# Patient Record
Sex: Female | Born: 1945
Health system: Southern US, Community
[De-identification: ages and names within clinical notes are randomized; demographics above are authoritative.]

## PROBLEM LIST (undated history)

## (undated) DIAGNOSIS — F329 Major depressive disorder, single episode, unspecified: Secondary | ICD-10-CM

## (undated) DIAGNOSIS — N200 Calculus of kidney: Secondary | ICD-10-CM

## (undated) DIAGNOSIS — H269 Unspecified cataract: Secondary | ICD-10-CM

## (undated) DIAGNOSIS — J45909 Unspecified asthma, uncomplicated: Secondary | ICD-10-CM

## (undated) DIAGNOSIS — I10 Essential (primary) hypertension: Secondary | ICD-10-CM

## (undated) DIAGNOSIS — R350 Frequency of micturition: Secondary | ICD-10-CM

## (undated) DIAGNOSIS — Z9989 Dependence on other enabling machines and devices: Secondary | ICD-10-CM

## (undated) DIAGNOSIS — E785 Hyperlipidemia, unspecified: Secondary | ICD-10-CM

## (undated) DIAGNOSIS — K219 Gastro-esophageal reflux disease without esophagitis: Secondary | ICD-10-CM

## (undated) DIAGNOSIS — F419 Anxiety disorder, unspecified: Secondary | ICD-10-CM

## (undated) DIAGNOSIS — F32A Depression, unspecified: Secondary | ICD-10-CM

## (undated) DIAGNOSIS — Z973 Presence of spectacles and contact lenses: Secondary | ICD-10-CM

## (undated) DIAGNOSIS — E049 Nontoxic goiter, unspecified: Secondary | ICD-10-CM

## (undated) DIAGNOSIS — G4733 Obstructive sleep apnea (adult) (pediatric): Secondary | ICD-10-CM

## (undated) DIAGNOSIS — G4730 Sleep apnea, unspecified: Secondary | ICD-10-CM

## (undated) DIAGNOSIS — T7840XA Allergy, unspecified, initial encounter: Secondary | ICD-10-CM

## (undated) DIAGNOSIS — R3915 Urgency of urination: Secondary | ICD-10-CM

## (undated) HISTORY — DX: Major depressive disorder, single episode, unspecified: F32.9

## (undated) HISTORY — PX: TUBAL LIGATION: SHX77

## (undated) HISTORY — DX: Hyperlipidemia, unspecified: E78.5

## (undated) HISTORY — DX: Nontoxic goiter, unspecified: E04.9

## (undated) HISTORY — PX: BREAST BIOPSY: SHX20

## (undated) HISTORY — DX: Unspecified cataract: H26.9

## (undated) HISTORY — DX: Depression, unspecified: F32.A

## (undated) HISTORY — DX: Gastro-esophageal reflux disease without esophagitis: K21.9

## (undated) HISTORY — DX: Anxiety disorder, unspecified: F41.9

## (undated) HISTORY — PX: EYE SURGERY: SHX253

## (undated) HISTORY — DX: Sleep apnea, unspecified: G47.30

## (undated) HISTORY — DX: Calculus of kidney: N20.0

## (undated) HISTORY — DX: Allergy, unspecified, initial encounter: T78.40XA

## (undated) HISTORY — DX: Essential (primary) hypertension: I10

## (undated) HISTORY — PX: CATARACT EXTRACTION, BILATERAL: SHX1313

---

## 1988-09-18 HISTORY — PX: DILATION AND CURETTAGE OF UTERUS: SHX78

## 1998-02-24 ENCOUNTER — Ambulatory Visit (HOSPITAL_COMMUNITY): Admission: RE | Admit: 1998-02-24 | Discharge: 1998-02-24 | Payer: Self-pay | Admitting: Obstetrics and Gynecology

## 1999-02-01 ENCOUNTER — Other Ambulatory Visit: Admission: RE | Admit: 1999-02-01 | Discharge: 1999-02-01 | Payer: Self-pay | Admitting: Obstetrics and Gynecology

## 2000-04-17 ENCOUNTER — Other Ambulatory Visit: Admission: RE | Admit: 2000-04-17 | Discharge: 2000-04-17 | Payer: Self-pay | Admitting: *Deleted

## 2000-04-17 ENCOUNTER — Encounter (INDEPENDENT_AMBULATORY_CARE_PROVIDER_SITE_OTHER): Payer: Self-pay | Admitting: Specialist

## 2001-04-15 ENCOUNTER — Encounter (INDEPENDENT_AMBULATORY_CARE_PROVIDER_SITE_OTHER): Payer: Self-pay | Admitting: *Deleted

## 2001-04-15 ENCOUNTER — Ambulatory Visit (HOSPITAL_COMMUNITY): Admission: RE | Admit: 2001-04-15 | Discharge: 2001-04-15 | Payer: Self-pay | Admitting: *Deleted

## 2001-05-21 ENCOUNTER — Other Ambulatory Visit: Admission: RE | Admit: 2001-05-21 | Discharge: 2001-05-21 | Payer: Self-pay | Admitting: Obstetrics and Gynecology

## 2002-02-07 ENCOUNTER — Encounter: Payer: Self-pay | Admitting: Family Medicine

## 2002-02-07 ENCOUNTER — Encounter: Admission: RE | Admit: 2002-02-07 | Discharge: 2002-02-07 | Payer: Self-pay | Admitting: Family Medicine

## 2002-02-18 ENCOUNTER — Encounter: Admission: RE | Admit: 2002-02-18 | Discharge: 2002-02-18 | Payer: Self-pay | Admitting: Family Medicine

## 2002-02-18 ENCOUNTER — Encounter: Payer: Self-pay | Admitting: Family Medicine

## 2002-11-04 ENCOUNTER — Other Ambulatory Visit: Admission: RE | Admit: 2002-11-04 | Discharge: 2002-11-04 | Payer: Self-pay | Admitting: Obstetrics and Gynecology

## 2003-01-03 ENCOUNTER — Encounter: Payer: Self-pay | Admitting: Emergency Medicine

## 2003-01-03 ENCOUNTER — Emergency Department (HOSPITAL_COMMUNITY): Admission: EM | Admit: 2003-01-03 | Discharge: 2003-01-03 | Payer: Self-pay | Admitting: *Deleted

## 2003-01-15 ENCOUNTER — Encounter: Payer: Self-pay | Admitting: Family Medicine

## 2003-01-15 ENCOUNTER — Encounter: Admission: RE | Admit: 2003-01-15 | Discharge: 2003-01-15 | Payer: Self-pay | Admitting: Family Medicine

## 2003-02-20 ENCOUNTER — Encounter: Admission: RE | Admit: 2003-02-20 | Discharge: 2003-02-20 | Payer: Self-pay | Admitting: Obstetrics and Gynecology

## 2003-02-20 ENCOUNTER — Encounter: Payer: Self-pay | Admitting: Obstetrics and Gynecology

## 2004-01-23 ENCOUNTER — Ambulatory Visit (HOSPITAL_BASED_OUTPATIENT_CLINIC_OR_DEPARTMENT_OTHER): Admission: RE | Admit: 2004-01-23 | Discharge: 2004-01-23 | Payer: Self-pay | Admitting: Family Medicine

## 2004-03-11 ENCOUNTER — Ambulatory Visit (HOSPITAL_BASED_OUTPATIENT_CLINIC_OR_DEPARTMENT_OTHER): Admission: RE | Admit: 2004-03-11 | Discharge: 2004-03-11 | Payer: Self-pay | Admitting: Family Medicine

## 2004-10-25 ENCOUNTER — Other Ambulatory Visit: Admission: RE | Admit: 2004-10-25 | Discharge: 2004-10-25 | Payer: Self-pay | Admitting: Obstetrics and Gynecology

## 2004-11-15 ENCOUNTER — Encounter: Admission: RE | Admit: 2004-11-15 | Discharge: 2004-11-15 | Payer: Self-pay | Admitting: Obstetrics and Gynecology

## 2005-04-18 ENCOUNTER — Ambulatory Visit: Payer: Self-pay | Admitting: Family Medicine

## 2005-05-02 ENCOUNTER — Ambulatory Visit: Payer: Self-pay | Admitting: Family Medicine

## 2005-05-12 ENCOUNTER — Ambulatory Visit: Payer: Self-pay | Admitting: Internal Medicine

## 2005-07-11 ENCOUNTER — Ambulatory Visit: Payer: Self-pay | Admitting: Family Medicine

## 2005-09-21 ENCOUNTER — Ambulatory Visit: Payer: Self-pay | Admitting: Family Medicine

## 2005-09-26 ENCOUNTER — Ambulatory Visit: Payer: Self-pay | Admitting: Family Medicine

## 2005-10-09 ENCOUNTER — Ambulatory Visit: Payer: Self-pay | Admitting: Family Medicine

## 2005-10-31 ENCOUNTER — Other Ambulatory Visit: Admission: RE | Admit: 2005-10-31 | Discharge: 2005-10-31 | Payer: Self-pay | Admitting: Obstetrics and Gynecology

## 2005-11-10 ENCOUNTER — Ambulatory Visit: Payer: Self-pay | Admitting: Family Medicine

## 2005-11-17 ENCOUNTER — Encounter: Admission: RE | Admit: 2005-11-17 | Discharge: 2005-11-17 | Payer: Self-pay | Admitting: Obstetrics and Gynecology

## 2006-01-01 ENCOUNTER — Ambulatory Visit: Payer: Self-pay | Admitting: Family Medicine

## 2006-03-27 ENCOUNTER — Ambulatory Visit: Payer: Self-pay | Admitting: Family Medicine

## 2006-04-18 ENCOUNTER — Ambulatory Visit: Payer: Self-pay | Admitting: Family Medicine

## 2006-05-01 ENCOUNTER — Ambulatory Visit: Payer: Self-pay | Admitting: Family Medicine

## 2006-05-09 ENCOUNTER — Ambulatory Visit: Payer: Self-pay | Admitting: Family Medicine

## 2006-10-03 ENCOUNTER — Ambulatory Visit: Payer: Self-pay | Admitting: Family Medicine

## 2006-11-07 ENCOUNTER — Ambulatory Visit: Payer: Self-pay | Admitting: Family Medicine

## 2006-11-07 LAB — CONVERTED CEMR LAB
ALT: 25 units/L (ref 0–40)
AST: 27 units/L (ref 0–37)
Albumin: 3.6 g/dL (ref 3.5–5.2)
Alkaline Phosphatase: 66 units/L (ref 39–117)
BUN: 20 mg/dL (ref 6–23)
Bilirubin, Direct: 0.1 mg/dL (ref 0.0–0.3)
CO2: 33 meq/L — ABNORMAL HIGH (ref 19–32)
Calcium: 9.5 mg/dL (ref 8.4–10.5)
Chloride: 102 meq/L (ref 96–112)
Creatinine, Ser: 1.2 mg/dL (ref 0.4–1.2)
GFR calc Af Amer: 59 mL/min
GFR calc non Af Amer: 49 mL/min
Glucose, Bld: 109 mg/dL — ABNORMAL HIGH (ref 70–99)
Potassium: 3.4 meq/L — ABNORMAL LOW (ref 3.5–5.1)
Sodium: 141 meq/L (ref 135–145)
Total Bilirubin: 0.6 mg/dL (ref 0.3–1.2)
Total Protein: 7.5 g/dL (ref 6.0–8.3)

## 2006-11-22 ENCOUNTER — Encounter: Admission: RE | Admit: 2006-11-22 | Discharge: 2006-11-22 | Payer: Self-pay | Admitting: Obstetrics and Gynecology

## 2007-01-17 ENCOUNTER — Ambulatory Visit: Payer: Self-pay | Admitting: Family Medicine

## 2007-01-24 ENCOUNTER — Encounter: Admission: RE | Admit: 2007-01-24 | Discharge: 2007-01-24 | Payer: Self-pay | Admitting: Family Medicine

## 2007-02-01 ENCOUNTER — Ambulatory Visit: Payer: Self-pay | Admitting: Cardiology

## 2007-02-06 ENCOUNTER — Ambulatory Visit: Payer: Self-pay | Admitting: Internal Medicine

## 2007-02-07 ENCOUNTER — Encounter: Payer: Self-pay | Admitting: Internal Medicine

## 2007-02-07 DIAGNOSIS — I10 Essential (primary) hypertension: Secondary | ICD-10-CM | POA: Insufficient documentation

## 2007-02-07 DIAGNOSIS — J309 Allergic rhinitis, unspecified: Secondary | ICD-10-CM | POA: Insufficient documentation

## 2007-02-07 DIAGNOSIS — Z9889 Other specified postprocedural states: Secondary | ICD-10-CM | POA: Insufficient documentation

## 2007-02-07 DIAGNOSIS — K219 Gastro-esophageal reflux disease without esophagitis: Secondary | ICD-10-CM

## 2007-02-07 DIAGNOSIS — Z8719 Personal history of other diseases of the digestive system: Secondary | ICD-10-CM

## 2007-02-07 DIAGNOSIS — E785 Hyperlipidemia, unspecified: Secondary | ICD-10-CM

## 2007-02-15 ENCOUNTER — Encounter: Payer: Self-pay | Admitting: Family Medicine

## 2007-02-18 ENCOUNTER — Ambulatory Visit: Payer: Self-pay | Admitting: Family Medicine

## 2007-03-04 LAB — CONVERTED CEMR LAB
Bilirubin Urine: NEGATIVE
Ketones, ur: NEGATIVE mg/dL
Nitrite: POSITIVE — AB
Protein, ur: 100 mg/dL — AB
Specific Gravity, Urine: 1.017 (ref 1.005–1.03)
Urine Glucose: NEGATIVE mg/dL
Urobilinogen, UA: 1 (ref 0.0–1.0)
WBC, UA: NONE SEEN cells/hpf (ref <3)
pH: 6.5 (ref 5.0–8.0)

## 2007-03-13 ENCOUNTER — Ambulatory Visit: Payer: Self-pay | Admitting: Family Medicine

## 2007-03-19 LAB — CONVERTED CEMR LAB
ALT: 15 units/L (ref 0–35)
AST: 24 units/L (ref 0–37)
Albumin: 3.6 g/dL (ref 3.5–5.2)
Alkaline Phosphatase: 60 units/L (ref 39–117)
BUN: 15 mg/dL (ref 6–23)
Bilirubin, Direct: 0.1 mg/dL (ref 0.0–0.3)
CO2: 33 meq/L — ABNORMAL HIGH (ref 19–32)
Calcium: 9.3 mg/dL (ref 8.4–10.5)
Chloride: 102 meq/L (ref 96–112)
Cholesterol: 214 mg/dL (ref 0–200)
Creatinine, Ser: 1 mg/dL (ref 0.4–1.2)
Direct LDL: 144.2 mg/dL
GFR calc Af Amer: 73 mL/min
GFR calc non Af Amer: 60 mL/min
Glucose, Bld: 91 mg/dL (ref 70–99)
HDL: 46.7 mg/dL (ref >39.0)
Potassium: 3.9 meq/L (ref 3.5–5.1)
Sodium: 144 meq/L (ref 135–145)
Total Bilirubin: 0.7 mg/dL (ref 0.3–1.2)
Total CHOL/HDL Ratio: 4.6
Total Protein: 7.2 g/dL (ref 6.0–8.3)
Triglycerides: 96 mg/dL (ref 0–149)
VLDL: 19 mg/dL (ref 0–40)

## 2007-06-04 ENCOUNTER — Ambulatory Visit: Payer: Self-pay | Admitting: Family Medicine

## 2007-06-04 DIAGNOSIS — E049 Nontoxic goiter, unspecified: Secondary | ICD-10-CM | POA: Insufficient documentation

## 2007-06-04 DIAGNOSIS — R209 Unspecified disturbances of skin sensation: Secondary | ICD-10-CM

## 2007-06-04 DIAGNOSIS — R634 Abnormal weight loss: Secondary | ICD-10-CM

## 2007-06-05 ENCOUNTER — Encounter (INDEPENDENT_AMBULATORY_CARE_PROVIDER_SITE_OTHER): Payer: Self-pay | Admitting: *Deleted

## 2007-06-05 ENCOUNTER — Encounter: Admission: RE | Admit: 2007-06-05 | Discharge: 2007-06-05 | Payer: Self-pay | Admitting: Family Medicine

## 2007-06-05 LAB — CONVERTED CEMR LAB
Free T4: 0.8 ng/dL (ref 0.6–1.6)
T3, Free: 2.8 pg/mL (ref 2.3–4.2)
TSH: 1.28 microintl units/mL (ref 0.35–5.50)

## 2007-06-11 ENCOUNTER — Encounter (INDEPENDENT_AMBULATORY_CARE_PROVIDER_SITE_OTHER): Payer: Self-pay | Admitting: *Deleted

## 2007-06-12 ENCOUNTER — Ambulatory Visit: Payer: Self-pay | Admitting: Family Medicine

## 2007-08-27 ENCOUNTER — Ambulatory Visit: Payer: Self-pay | Admitting: Gastroenterology

## 2007-09-03 ENCOUNTER — Encounter: Payer: Self-pay | Admitting: Gastroenterology

## 2007-09-03 ENCOUNTER — Encounter: Payer: Self-pay | Admitting: Family Medicine

## 2007-09-03 ENCOUNTER — Ambulatory Visit: Payer: Self-pay | Admitting: Gastroenterology

## 2007-10-08 ENCOUNTER — Ambulatory Visit: Payer: Self-pay | Admitting: Gastroenterology

## 2007-12-31 ENCOUNTER — Ambulatory Visit: Payer: Self-pay | Admitting: Family Medicine

## 2008-01-02 ENCOUNTER — Encounter (INDEPENDENT_AMBULATORY_CARE_PROVIDER_SITE_OTHER): Payer: Self-pay | Admitting: *Deleted

## 2008-01-21 ENCOUNTER — Encounter: Admission: RE | Admit: 2008-01-21 | Discharge: 2008-01-21 | Payer: Self-pay | Admitting: Obstetrics and Gynecology

## 2008-04-24 ENCOUNTER — Ambulatory Visit: Payer: Self-pay | Admitting: Family Medicine

## 2008-04-24 DIAGNOSIS — G4700 Insomnia, unspecified: Secondary | ICD-10-CM | POA: Insufficient documentation

## 2008-05-04 ENCOUNTER — Ambulatory Visit: Payer: Self-pay | Admitting: Family Medicine

## 2008-05-15 ENCOUNTER — Encounter (INDEPENDENT_AMBULATORY_CARE_PROVIDER_SITE_OTHER): Payer: Self-pay | Admitting: *Deleted

## 2008-05-15 LAB — CONVERTED CEMR LAB
ALT: 20 units/L (ref 0–35)
AST: 21 units/L (ref 0–37)
Bilirubin, Direct: 0.1 mg/dL (ref 0.0–0.3)
Calcium: 9.3 mg/dL (ref 8.4–10.5)
Chloride: 104 meq/L (ref 96–112)
Creatinine, Ser: 1.2 mg/dL (ref 0.4–1.2)
GFR calc Af Amer: 59 mL/min
Glucose, Bld: 85 mg/dL (ref 70–99)
Potassium: 3.7 meq/L (ref 3.5–5.1)
Sodium: 140 meq/L (ref 135–145)
Total Bilirubin: 0.7 mg/dL (ref 0.3–1.2)
Total CHOL/HDL Ratio: 4.8
Triglycerides: 120 mg/dL (ref 0–149)
VLDL: 24 mg/dL (ref 0–40)

## 2008-06-30 ENCOUNTER — Ambulatory Visit: Payer: Self-pay | Admitting: Family Medicine

## 2008-06-30 DIAGNOSIS — F411 Generalized anxiety disorder: Secondary | ICD-10-CM | POA: Insufficient documentation

## 2008-07-10 ENCOUNTER — Ambulatory Visit: Payer: Self-pay | Admitting: *Deleted

## 2008-07-15 ENCOUNTER — Encounter: Payer: Self-pay | Admitting: Family Medicine

## 2008-07-17 ENCOUNTER — Ambulatory Visit: Payer: Self-pay | Admitting: *Deleted

## 2008-07-20 ENCOUNTER — Inpatient Hospital Stay (HOSPITAL_COMMUNITY): Admission: RE | Admit: 2008-07-20 | Discharge: 2008-07-26 | Payer: Self-pay | Admitting: *Deleted

## 2008-07-20 ENCOUNTER — Emergency Department (HOSPITAL_BASED_OUTPATIENT_CLINIC_OR_DEPARTMENT_OTHER): Admission: EM | Admit: 2008-07-20 | Discharge: 2008-07-20 | Payer: Self-pay | Admitting: Emergency Medicine

## 2008-07-20 ENCOUNTER — Ambulatory Visit: Payer: Self-pay | Admitting: *Deleted

## 2008-07-29 ENCOUNTER — Other Ambulatory Visit (HOSPITAL_COMMUNITY): Admission: RE | Admit: 2008-07-29 | Discharge: 2008-08-12 | Payer: Self-pay | Admitting: Psychiatry

## 2008-07-31 ENCOUNTER — Ambulatory Visit: Payer: Self-pay | Admitting: Psychiatry

## 2008-08-04 ENCOUNTER — Ambulatory Visit: Payer: Self-pay | Admitting: Family Medicine

## 2008-08-14 ENCOUNTER — Ambulatory Visit: Payer: Self-pay | Admitting: *Deleted

## 2008-08-19 ENCOUNTER — Ambulatory Visit: Payer: Self-pay | Admitting: *Deleted

## 2008-08-26 ENCOUNTER — Ambulatory Visit: Payer: Self-pay | Admitting: *Deleted

## 2008-08-27 ENCOUNTER — Encounter (INDEPENDENT_AMBULATORY_CARE_PROVIDER_SITE_OTHER): Payer: Self-pay | Admitting: *Deleted

## 2008-08-27 LAB — CONVERTED CEMR LAB
ALT: 19 units/L (ref 0–35)
Albumin: 4 g/dL (ref 3.5–5.2)
BUN: 12 mg/dL (ref 6–23)
Bilirubin, Direct: 0.1 mg/dL (ref 0.0–0.3)
Chloride: 105 meq/L (ref 96–112)
Cholesterol: 192 mg/dL (ref 0–200)
GFR calc Af Amer: 82 mL/min
HCT: 36.6 % (ref 36.0–46.0)
HDL: 57.7 mg/dL (ref >39.0)
Hemoglobin: 11.9 g/dL — ABNORMAL LOW (ref 12.0–15.0)
MCHC: 32.6 g/dL (ref 30.0–36.0)
MCV: 86.5 fL (ref 78.0–100.0)
Potassium: 4.1 meq/L (ref 3.5–5.1)
RBC: 4.23 M/uL (ref 3.87–5.11)
Sodium: 140 meq/L (ref 135–145)
TSH: 0.86 microintl units/mL (ref 0.35–5.50)
Total Bilirubin: 0.4 mg/dL (ref 0.3–1.2)
Total Protein: 7.3 g/dL (ref 6.0–8.3)
Triglycerides: 60 mg/dL (ref 0–149)
WBC: 5.1 10*3/uL (ref 4.5–10.5)

## 2008-08-28 ENCOUNTER — Telehealth (INDEPENDENT_AMBULATORY_CARE_PROVIDER_SITE_OTHER): Payer: Self-pay | Admitting: *Deleted

## 2008-09-01 ENCOUNTER — Ambulatory Visit: Payer: Self-pay | Admitting: Family Medicine

## 2008-09-01 DIAGNOSIS — F411 Generalized anxiety disorder: Secondary | ICD-10-CM | POA: Insufficient documentation

## 2008-09-08 ENCOUNTER — Telehealth (INDEPENDENT_AMBULATORY_CARE_PROVIDER_SITE_OTHER): Payer: Self-pay | Admitting: *Deleted

## 2008-09-09 ENCOUNTER — Ambulatory Visit: Payer: Self-pay | Admitting: Internal Medicine

## 2008-09-09 DIAGNOSIS — J019 Acute sinusitis, unspecified: Secondary | ICD-10-CM

## 2008-09-15 ENCOUNTER — Ambulatory Visit: Payer: Self-pay | Admitting: Internal Medicine

## 2008-09-16 ENCOUNTER — Ambulatory Visit: Payer: Self-pay | Admitting: *Deleted

## 2008-09-23 ENCOUNTER — Ambulatory Visit: Payer: Self-pay | Admitting: *Deleted

## 2008-10-07 ENCOUNTER — Ambulatory Visit: Payer: Self-pay | Admitting: *Deleted

## 2008-10-09 ENCOUNTER — Ambulatory Visit: Payer: Self-pay | Admitting: *Deleted

## 2008-10-27 ENCOUNTER — Encounter: Payer: Self-pay | Admitting: Family Medicine

## 2008-12-07 ENCOUNTER — Ambulatory Visit: Payer: Self-pay | Admitting: Family Medicine

## 2008-12-28 ENCOUNTER — Ambulatory Visit: Payer: Self-pay | Admitting: Family Medicine

## 2008-12-28 ENCOUNTER — Other Ambulatory Visit: Admission: RE | Admit: 2008-12-28 | Discharge: 2008-12-28 | Payer: Self-pay | Admitting: Family Medicine

## 2008-12-28 ENCOUNTER — Encounter: Payer: Self-pay | Admitting: Family Medicine

## 2008-12-28 DIAGNOSIS — Z780 Asymptomatic menopausal state: Secondary | ICD-10-CM | POA: Insufficient documentation

## 2008-12-29 ENCOUNTER — Encounter: Payer: Self-pay | Admitting: Family Medicine

## 2008-12-30 ENCOUNTER — Encounter (INDEPENDENT_AMBULATORY_CARE_PROVIDER_SITE_OTHER): Payer: Self-pay | Admitting: *Deleted

## 2009-01-01 ENCOUNTER — Encounter: Admission: RE | Admit: 2009-01-01 | Discharge: 2009-01-01 | Payer: Self-pay | Admitting: Family Medicine

## 2009-01-01 ENCOUNTER — Encounter (INDEPENDENT_AMBULATORY_CARE_PROVIDER_SITE_OTHER): Payer: Self-pay | Admitting: *Deleted

## 2009-01-06 ENCOUNTER — Telehealth (INDEPENDENT_AMBULATORY_CARE_PROVIDER_SITE_OTHER): Payer: Self-pay | Admitting: *Deleted

## 2009-01-06 ENCOUNTER — Ambulatory Visit: Payer: Self-pay | Admitting: Family Medicine

## 2009-01-06 LAB — CONVERTED CEMR LAB
Blood in Urine, dipstick: NEGATIVE
Glucose, Urine, Semiquant: NEGATIVE
Specific Gravity, Urine: <1.005
Urobilinogen, UA: 0.2
WBC Urine, dipstick: NEGATIVE
pH: 6.5

## 2009-01-07 ENCOUNTER — Encounter (INDEPENDENT_AMBULATORY_CARE_PROVIDER_SITE_OTHER): Payer: Self-pay | Admitting: *Deleted

## 2009-01-10 LAB — CONVERTED CEMR LAB
ALT: 23 units/L (ref 0–35)
AST: 25 units/L (ref 0–37)
Albumin: 3.7 g/dL (ref 3.5–5.2)
Basophils Relative: 1 % (ref 0.0–3.0)
Bilirubin, Direct: 0.1 mg/dL (ref 0.0–0.3)
CO2: 34 meq/L — ABNORMAL HIGH (ref 19–32)
Calcium: 9.1 mg/dL (ref 8.4–10.5)
Cholesterol: 201 mg/dL — ABNORMAL HIGH (ref 0–200)
Creatinine, Ser: 0.9 mg/dL (ref 0.4–1.2)
Direct LDL: 128.9 mg/dL
Eosinophils Absolute: 0.4 10*3/uL (ref 0.0–0.7)
Lymphocytes Relative: 30.7 % (ref 12.0–46.0)
Lymphs Abs: 1.5 10*3/uL (ref 0.7–4.0)
MCV: 84.8 fL (ref 78.0–100.0)
Monocytes Relative: 6.3 % (ref 3.0–12.0)
Neutro Abs: 2.7 10*3/uL (ref 1.4–7.7)
Platelets: 228 10*3/uL (ref 150.0–400.0)
Potassium: 3.7 meq/L (ref 3.5–5.1)
RDW: 12.9 % (ref 11.5–14.6)
TSH: 1.71 microintl units/mL (ref 0.35–5.50)
VLDL: 20.6 mg/dL (ref 0.0–40.0)
WBC: 4.9 10*3/uL (ref 4.5–10.5)

## 2009-01-11 ENCOUNTER — Encounter (INDEPENDENT_AMBULATORY_CARE_PROVIDER_SITE_OTHER): Payer: Self-pay | Admitting: *Deleted

## 2009-01-21 ENCOUNTER — Encounter: Admission: RE | Admit: 2009-01-21 | Discharge: 2009-01-21 | Payer: Self-pay | Admitting: Family Medicine

## 2009-01-21 ENCOUNTER — Encounter: Payer: Self-pay | Admitting: Family Medicine

## 2009-01-25 ENCOUNTER — Encounter (INDEPENDENT_AMBULATORY_CARE_PROVIDER_SITE_OTHER): Payer: Self-pay | Admitting: *Deleted

## 2009-01-26 ENCOUNTER — Telehealth (INDEPENDENT_AMBULATORY_CARE_PROVIDER_SITE_OTHER): Payer: Self-pay | Admitting: *Deleted

## 2009-02-02 ENCOUNTER — Encounter (INDEPENDENT_AMBULATORY_CARE_PROVIDER_SITE_OTHER): Payer: Self-pay | Admitting: *Deleted

## 2009-03-09 ENCOUNTER — Telehealth (INDEPENDENT_AMBULATORY_CARE_PROVIDER_SITE_OTHER): Payer: Self-pay | Admitting: *Deleted

## 2009-03-16 ENCOUNTER — Telehealth (INDEPENDENT_AMBULATORY_CARE_PROVIDER_SITE_OTHER): Payer: Self-pay | Admitting: *Deleted

## 2009-03-18 ENCOUNTER — Encounter: Payer: Self-pay | Admitting: Family Medicine

## 2009-03-24 ENCOUNTER — Ambulatory Visit: Payer: Self-pay | Admitting: Family Medicine

## 2009-03-31 ENCOUNTER — Encounter (INDEPENDENT_AMBULATORY_CARE_PROVIDER_SITE_OTHER): Payer: Self-pay | Admitting: *Deleted

## 2009-04-05 ENCOUNTER — Encounter (INDEPENDENT_AMBULATORY_CARE_PROVIDER_SITE_OTHER): Payer: Self-pay | Admitting: *Deleted

## 2009-04-19 ENCOUNTER — Ambulatory Visit: Payer: Self-pay | Admitting: Family Medicine

## 2009-04-19 DIAGNOSIS — L919 Hypertrophic disorder of the skin, unspecified: Secondary | ICD-10-CM

## 2009-04-19 DIAGNOSIS — L909 Atrophic disorder of skin, unspecified: Secondary | ICD-10-CM | POA: Insufficient documentation

## 2009-04-22 ENCOUNTER — Telehealth (INDEPENDENT_AMBULATORY_CARE_PROVIDER_SITE_OTHER): Payer: Self-pay | Admitting: *Deleted

## 2009-04-22 ENCOUNTER — Encounter: Payer: Self-pay | Admitting: Family Medicine

## 2009-04-23 ENCOUNTER — Encounter: Payer: Self-pay | Admitting: Family Medicine

## 2009-04-29 ENCOUNTER — Encounter (INDEPENDENT_AMBULATORY_CARE_PROVIDER_SITE_OTHER): Payer: Self-pay | Admitting: *Deleted

## 2009-05-20 ENCOUNTER — Ambulatory Visit: Payer: Self-pay | Admitting: Family Medicine

## 2009-05-20 DIAGNOSIS — F341 Dysthymic disorder: Secondary | ICD-10-CM | POA: Insufficient documentation

## 2009-06-09 ENCOUNTER — Telehealth: Payer: Self-pay | Admitting: Family Medicine

## 2010-02-28 ENCOUNTER — Encounter: Payer: Self-pay | Admitting: Family Medicine

## 2010-02-28 ENCOUNTER — Encounter: Admission: RE | Admit: 2010-02-28 | Discharge: 2010-02-28 | Payer: Self-pay | Admitting: Obstetrics and Gynecology

## 2010-03-24 ENCOUNTER — Other Ambulatory Visit: Admission: RE | Admit: 2010-03-24 | Discharge: 2010-03-24 | Payer: Self-pay | Admitting: Family Medicine

## 2010-03-24 ENCOUNTER — Ambulatory Visit: Payer: Self-pay | Admitting: Family Medicine

## 2010-03-24 ENCOUNTER — Encounter (INDEPENDENT_AMBULATORY_CARE_PROVIDER_SITE_OTHER): Payer: Self-pay | Admitting: *Deleted

## 2010-04-19 ENCOUNTER — Ambulatory Visit: Payer: Self-pay | Admitting: Family Medicine

## 2010-04-21 ENCOUNTER — Encounter: Admission: RE | Admit: 2010-04-21 | Discharge: 2010-04-21 | Payer: Self-pay | Admitting: Family Medicine

## 2010-10-08 ENCOUNTER — Encounter: Payer: Self-pay | Admitting: Family Medicine

## 2010-10-09 ENCOUNTER — Encounter: Payer: Self-pay | Admitting: Specialist

## 2010-10-16 LAB — CONVERTED CEMR LAB
ALT: 17 units/L (ref 0–35)
ALT: 20 units/L (ref 0–35)
AST: 20 units/L (ref 0–37)
Alkaline Phosphatase: 63 units/L (ref 39–117)
BUN: 13 mg/dL (ref 6–23)
BUN: 14 mg/dL (ref 6–23)
Basophils Relative: 0.5 % (ref 0.0–3.0)
Bilirubin, Direct: 0 mg/dL (ref 0.0–0.3)
Bilirubin, Direct: 0.1 mg/dL (ref 0.0–0.3)
CO2: 31 meq/L (ref 19–32)
Chloride: 100 meq/L (ref 96–112)
Chloride: 101 meq/L (ref 96–112)
Cholesterol: 144 mg/dL (ref 0–200)
Cholesterol: 211 mg/dL (ref 0–200)
Creatinine, Ser: 1 mg/dL (ref 0.4–1.2)
Direct LDL: 142.4 mg/dL
Eosinophils Absolute: 0.3 10*3/uL (ref 0.0–0.7)
Eosinophils Relative: 4.6 % (ref 0.0–5.0)
Eosinophils Relative: 6.6 % — ABNORMAL HIGH (ref 0.0–5.0)
GFR calc Af Amer: 73 mL/min
GFR calc non Af Amer: 60 mL/min
Glucose, Bld: 102 mg/dL — ABNORMAL HIGH (ref 70–99)
HCT: 38.3 % (ref 36.0–46.0)
Hemoglobin: 11.9 g/dL — ABNORMAL LOW (ref 12.0–15.0)
Hemoglobin: 12.3 g/dL (ref 12.0–15.0)
LDL Cholesterol: 74 mg/dL (ref 0–99)
Lymphs Abs: 1.5 10*3/uL (ref 0.7–4.0)
MCV: 83.9 fL (ref 78.0–100.0)
Monocytes Absolute: 0.4 10*3/uL (ref 0.1–1.0)
Monocytes Relative: 6.8 % (ref 3.0–12.0)
Neutro Abs: 2.9 10*3/uL (ref 1.4–7.7)
Neutrophils Relative %: 61.1 % (ref 43.0–77.0)
Nitrite: NEGATIVE
Pap Smear: NORMAL
Platelets: 218 10*3/uL (ref 150–400)
Platelets: 261 10*3/uL (ref 150.0–400.0)
Potassium: 3.8 meq/L (ref 3.5–5.1)
Potassium: 4.2 meq/L (ref 3.5–5.1)
RBC: 4.28 M/uL (ref 3.87–5.11)
RBC: 4.56 M/uL (ref 3.87–5.11)
TSH: 1.15 microintl units/mL (ref 0.35–5.50)
Total Bilirubin: 0.4 mg/dL (ref 0.3–1.2)
Total Bilirubin: 0.8 mg/dL (ref 0.3–1.2)
Total CHOL/HDL Ratio: 3
Total Protein: 7.2 g/dL (ref 6.0–8.3)
Total Protein: 7.5 g/dL (ref 6.0–8.3)
Triglycerides: 105 mg/dL (ref 0–149)
Urobilinogen, UA: NEGATIVE
VLDL: 21 mg/dL (ref 0–40)
VLDL: 22.6 mg/dL (ref 0.0–40.0)
Vit D, 25-Hydroxy: 32 ng/mL (ref 30–89)
WBC Urine, dipstick: NEGATIVE
WBC: 5.2 10*3/uL (ref 4.5–10.5)
WBC: 5.8 10*3/uL (ref 4.5–10.5)

## 2010-10-17 ENCOUNTER — Ambulatory Visit
Admission: RE | Admit: 2010-10-17 | Discharge: 2010-10-17 | Payer: Self-pay | Source: Home / Self Care | Attending: Family Medicine | Admitting: Family Medicine

## 2010-10-18 NOTE — Letter (Signed)
Summary: Previsit letter  [facility]  [address] [place], [place] [address]   Phone: [phone]  Fax: [phone]       03/24/2010 MRN: [id number]  [facility] Wendy Christensen [address] [place], [place]  [address]  Dear [name],  Welcome to the Gastroenterology Division at [facility].    You are scheduled to see a nurse for your pre-procedure visit on 04-13-10 at 9:00a.m. on the 3rd floor at [facility], [address]  We ask that you try to arrive at our office 15 minutes prior to your appointment time to allow for check-in.  Your nurse visit will consist of discussing your medical and surgical history, your immediate family medical history, and your medications.    Please bring a complete list of all your medications or, if you prefer, bring the medication bottles and we will list them.  We will need to be aware of both prescribed and over the counter drugs.  We will need to know exact dosage information as well.  If you are on blood thinners (Coumadin, Plavix, Aggrenox, Ticlid, etc.) please call our office today/prior to your appointment, as we need to consult with your physician about holding your medication.   Please be prepared to read and sign documents such as consent forms, a financial agreement, and acknowledgement forms.  If necessary, and with your consent, a friend or relative is welcome to sit-in on the nurse visit with you.  Please bring your insurance card so that we may make a copy of it.  If your insurance requires a referral to see a specialist, please bring your referral form from your primary care physician.  No co-pay is required for this nurse visit.     If you cannot keep your appointment, please call [phone] to cancel or reschedule prior to your appointment date.  This allows [place] the opportunity to schedule an appointment for another patient in need of care.    Thank you for choosing [facility] for your  medical needs.  We appreciate the opportunity to care for you.  Please visit [place] at our website  to learn more about our practice.                     Sincerely.                                                                                                                   The Gastroenterology Division

## 2010-10-18 NOTE — Assessment & Plan Note (Signed)
Summary: cpx//pt will be fasting//lch   Vital Signs:  Patient profile:   65 year old female Height:      67 inches Weight:      179 pounds BMI:     28.14 Temp:     98.7 degrees F oral Pulse rate:   80 / minute Resp:     20 per minute BP sitting:   118 / 80  (left arm)  Vitals Entered By: Jeremy Johann CMA (March 24, 2010 9:05 AM) CC: cpx, fasting, pap Comments REVIEWED MED LIST, PATIENT AGREED DOSE AND INSTRUCTION CORRECT    History of Present Illness: Pt here for cpe and labs and pap.  Pt only complaint is R shoulder pain after blowing leaves in spring. Pt has been taking tylenol with little relief.  Hyperlipidemia follow-up      This is a 65 year old woman who presents for Hyperlipidemia follow-up.  The patient denies muscle aches, GI upset, abdominal pain, flushing, itching, constipation, diarrhea, and fatigue.  The patient denies the following symptoms: chest pain/pressure, exercise intolerance, dypsnea, palpitations, syncope, and pedal edema.  Compliance with medications (by patient report) has been near 100%.  Dietary compliance has been good.  The patient reports exercising 3-4X per week.  Adjunctive measures currently used by the patient include ASA and fish oil supplements.    Hypertension follow-up      The patient also presents for Hypertension follow-up.  The patient denies lightheadedness, urinary frequency, headaches, edema, impotence, rash, and fatigue.  The patient denies the following associated symptoms: chest pain, chest pressure, exercise intolerance, dyspnea, palpitations, syncope, leg edema, and pedal edema.  Compliance with medications (by patient report) has been near 100%.  The patient reports that dietary compliance has been good.  The patient reports exercising 3-4X per week.  Adjunctive measures currently used by the patient include salt restriction.    Preventive Screening-Counseling & Management  Alcohol-Tobacco     Alcohol drinks/day: 0     Alcohol  type: cocktail     Smoking Status: never     Passive Smoke Exposure: no  Caffeine-Diet-Exercise     Caffeine use/day: 1     Diet Comments: Flat belly diet --  recc.     Diet Counseling: to improve diet; diet is suboptimal     Does Patient Exercise: yes     Type of exercise: walking     Exercise (avg: min/session): 30-60     Times/week: 3  Hep-HIV-STD-Contraception     Hepatitis Risk: no risk noted     HIV Risk: no     Dental Visit-last 6 months yes     Dental Care Counseling: not indicated; dental care within six months     SBE monthly: yes     SBE Education/Counseling: not indicated; SBE done regularly  Safety-Violence-Falls     Seat Belt Use: yes     Seat Belt Counseling: not indicated; patient wears seat belts     Firearms in the Home: no firearms in the home     Firearm Counseling: not applicable     Smoke Detectors: yes     Smoke Detector Counseling: no     Violence in the Home: no risk noted     Sexual Abuse: no      Sexual History:  currently monogamous and married.    Current Medications (verified): 1)  Lisinopril-Hydrochlorothiazide 20-12.5 Mg Tabs (Lisinopril-Hydrochlorothiazide) .Marland Kitchen.. 1 By Mouth Two Times A Day 2)  Toprol Xl 100 Mg  Xr24h-Tab (  Metoprolol Succinate) .Marland Kitchen.. 1 1/2 By Mouth Once Daily 3)  Crestor 40 Mg Tabs (Rosuvastatin Calcium) .... Take One Tablet Nightly 4)  Zegerid 40-1100 Mg  Caps (Omeprazole-Sodium Bicarbonate) .Marland Kitchen.. 1 By Mouth Once Daily 5)  Alprazolam 0.25 Mg  Tabs (Alprazolam) .Marland Kitchen.. 1 By Mouth At Bedtime As Needed 6)  Citalopram Hydrobromide 20 Mg Tabs (Citalopram Hydrobromide) .Marland Kitchen.. 1 By Mouth Once Daily 7)  Astepro 0.15 % Soln (Azelastine Hcl) .... 2 Sprays 2 Times A Day After Nettipot 8)  Remeron 45 Mg Tabs (Mirtazapine) .... Take One Tablet Every Evening At Bedtime.  Allergies (verified): No Known Drug Allergies  Past History:  Past Medical History: Last updated: 09/01/2008 Allergic  rhinitis GERD Hypertension Hyperlipidemia Anxiety  Past Surgical History: Last updated: 02/07/2007 4 CHILDBIRTHS  Family History: Last updated: 12/31/2007 Family History High cholesterol Family History Hypertension F--Copd M--HTN  Social History: Last updated: 03/24/2010  Drug use-no Regular exercise-yes Married Never Smoked Alcohol use-yes Retired  Risk Factors: Alcohol Use: 0 (03/24/2010) Caffeine Use: 1 (03/24/2010) Diet: Flat belly diet --  recc. (03/24/2010) Exercise: yes (03/24/2010)  Risk Factors: Smoking Status: never (03/24/2010) Passive Smoke Exposure: no (03/24/2010)  Family History: Reviewed history from 12/31/2007 and no changes required. Family History High cholesterol Family History Hypertension F--Copd M--HTN  Social History: Reviewed history from 12/31/2007 and no changes required.  Drug use-no Regular exercise-yes Married Never Smoked Alcohol use-yes Retired Sexual History:  currently monogamous, married  Review of Systems      See HPI General:  Denies chills, fatigue, fever, loss of appetite, malaise, sleep disorder, sweats, weakness, and weight loss. Eyes:  Denies blurring, discharge, double vision, eye irritation, eye pain, halos, itching, light sensitivity, red eye, vision loss-1 eye, and vision loss-both eyes; optho q1y. ENT:  Denies decreased hearing, difficulty swallowing, ear discharge, earache, hoarseness, nasal congestion, nosebleeds, postnasal drainage, ringing in ears, sinus pressure, and sore throat. CV:  Denies bluish discoloration of lips or nails, chest pain or discomfort, difficulty breathing at night, difficulty breathing while lying down, fainting, fatigue, leg cramps with exertion, lightheadness, near fainting, palpitations, shortness of breath with exertion, swelling of feet, swelling of hands, and weight gain. Resp:  Denies chest discomfort, chest pain with inspiration, cough, coughing up blood, excessive snoring,  hypersomnolence, morning headaches, pleuritic, shortness of breath, sputum productive, and wheezing. GI:  Denies abdominal pain, bloody stools, change in bowel habits, constipation, dark tarry stools, diarrhea, excessive appetite, gas, hemorrhoids, indigestion, loss of appetite, nausea, vomiting, vomiting blood, and yellowish skin color. GU:  Denies abnormal vaginal bleeding, decreased libido, discharge, dysuria, genital sores, hematuria, incontinence, nocturia, urinary frequency, and urinary hesitancy. MS:  Complains of joint pain; denies joint redness, joint swelling, loss of strength, low back pain, mid back pain, muscle aches, muscle , cramps, muscle weakness, stiffness, and thoracic pain; R shoulder. Derm:  Denies changes in color of skin, changes in nail beds, dryness, excessive perspiration, flushing, hair loss, insect bite(s), itching, lesion(s), poor wound healing, and rash. Neuro:  Denies brief paralysis, difficulty with concentration, disturbances in coordination, falling down, headaches, inability to speak, memory loss, numbness, poor balance, seizures, sensation of room spinning, tingling, tremors, visual disturbances, and weakness. Psych:  Denies alternate hallucination ( auditory/visual), anxiety, depression, easily angered, easily tearful, irritability, mental problems, panic attacks, sense of great danger, suicidal thoughts/plans, thoughts of violence, unusual visions or sounds, and thoughts /plans of harming others. Endo:  Denies cold intolerance, excessive hunger, excessive thirst, excessive urination, heat intolerance, polyuria, and weight change. Heme:  Denies  abnormal bruising, bleeding, enlarge lymph nodes, fevers, pallor, and skin discoloration. Allergy:  Denies hives or rash, itching eyes, persistent infections, seasonal allergies, and sneezing.  Physical Exam  General:  Well-developed,well-nourished,in no acute distress; alert,appropriate and cooperative throughout  examination Head:  Normocephalic and atraumatic without obvious abnormalities. No apparent alopecia or balding. Eyes:  vision grossly intact, pupils equal, pupils round, pupils reactive to light, and no injection.   Ears:  External ear exam shows no significant lesions or deformities.  Otoscopic examination reveals clear canals, tympanic membranes are intact bilaterally without bulging, retraction, inflammation or discharge. Hearing is grossly normal bilaterally. Nose:  External nasal examination shows no deformity or inflammation. Nasal mucosa are pink and moist without lesions or exudates. Mouth:  Oral mucosa and oropharynx without lesions or exudates.  Teeth in good repair. Neck:  No deformities, masses, or tenderness noted.no carotid bruits.   Chest Wall:  No deformities, masses, or tenderness noted. Breasts:  No mass, nodules, thickening, tenderness, bulging, retraction, inflamation, nipple discharge or skin changes noted.   Lungs:  Normal respiratory effort, chest expands symmetrically. Lungs are clear to auscultation, no crackles or wheezes. Heart:  normal rate and no murmur.   Abdomen:  Bowel sounds positive,abdomen soft and non-tender without masses, organomegaly or hernias noted. Rectal:  No external abnormalities noted. Normal sphincter tone. No rectal masses or tenderness. Genitalia:  Pelvic Exam:        External: normal female genitalia without lesions or masses        Vagina: normal without lesions or masses        Cervix: normal without lesions or masses        Adnexa: normal bimanual exam without masses or fullness        Uterus: normal by palpation        Pap smear: performed Msk:  normal ROM, no joint tenderness, no joint swelling, no joint warmth, no redness over joints, no joint deformities, no joint instability, and no crepitation.   Pulses:  R posterior tibial normal, R dorsalis pedis normal, R carotid normal, L posterior tibial normal, L dorsalis pedis normal, and L  carotid normal.   Extremities:  No clubbing, cyanosis, edema, or deformity noted with normal full range of motion of all joints.   Neurologic:  No cranial nerve deficits noted. Station and gait are normal. Plantar reflexes are down-going bilaterally. DTRs are symmetrical throughout. Sensory, motor and coordinative functions appear intact. Skin:  Intact without suspicious lesions or rashes Cervical Nodes:  No lymphadenopathy noted Psych:  Cognition and judgment appear intact. Alert and cooperative with normal attention span and concentration. No apparent delusions, illusions, hallucinations   Impression & Recommendations:  Problem # 1:  PREVENTIVE HEALTH CARE (ICD-V70.0)  Orders: Venipuncture (32440) TLB-Lipid Panel (80061-LIPID) TLB-BMP (Basic Metabolic Panel-BMET) (80048-METABOL) TLB-CBC Platelet - w/Differential (85025-CBCD) TLB-Hepatic/Liver Function Pnl (80076-HEPATIC) TLB-TSH (Thyroid Stimulating Hormone) (84443-TSH) T-Vitamin D (25-Hydroxy) (10272-53664) UA Dipstick w/o Micro (manual) (40347) Gastroenterology Referral (GI) EKG w/ Interpretation (93000)  Problem # 2:  ANXIETY DEPRESSION (ICD-300.4)  Problem # 3:  POSTMENOPAUSAL STATUS (ICD-V49.81)  Orders: Venipuncture (42595) TLB-Lipid Panel (80061-LIPID) TLB-BMP (Basic Metabolic Panel-BMET) (80048-METABOL) TLB-CBC Platelet - w/Differential (85025-CBCD) TLB-Hepatic/Liver Function Pnl (80076-HEPATIC) TLB-TSH (Thyroid Stimulating Hormone) (84443-TSH) T-Vitamin D (25-Hydroxy) (63875-64332) EKG w/ Interpretation (93000)  Problem # 4:  GOITER NOS (ICD-240.9)  Orders: Venipuncture (95188) TLB-Lipid Panel (80061-LIPID) TLB-BMP (Basic Metabolic Panel-BMET) (80048-METABOL) TLB-CBC Platelet - w/Differential (85025-CBCD) TLB-Hepatic/Liver Function Pnl (80076-HEPATIC) TLB-TSH (Thyroid Stimulating Hormone) (84443-TSH) T-Vitamin D (25-Hydroxy) (41660-63016)  Problem #  5:  HYPERLIPIDEMIA (ICD-272.4)  Her updated medication  list for this problem includes:    Crestor 40 Mg Tabs (Rosuvastatin calcium) .Marland Kitchen... Take one tablet nightly  Orders: Venipuncture ([address]) TLB-Lipid Panel (80061-LIPID) TLB-BMP (Basic Metabolic Panel-BMET) (80048-METABOL) TLB-CBC Platelet - w/Differential (85025-CBCD) TLB-Hepatic/Liver Function Pnl (80076-HEPATIC) TLB-TSH (Thyroid Stimulating Hormone) (84443-TSH) T-Vitamin D (25-Hydroxy) (60454-09811)  Problem # 6:  HYPERTENSION (ICD-401.9)  Her updated medication list for this problem includes:    Lisinopril-hydrochlorothiazide 20-12.5 Mg Tabs (Lisinopril-hydrochlorothiazide) .Marland Kitchen... 1 by mouth two times a day    Toprol Xl 100 Mg Xr24h-tab (Metoprolol succinate) .Marland Kitchen... 1 1/2 by mouth once daily  Orders: Venipuncture (91478) TLB-Lipid Panel (80061-LIPID) TLB-BMP (Basic Metabolic Panel-BMET) (80048-METABOL) TLB-CBC Platelet - w/Differential (85025-CBCD) TLB-Hepatic/Liver Function Pnl (80076-HEPATIC) TLB-TSH (Thyroid Stimulating Hormone) (84443-TSH) T-Vitamin D (25-Hydroxy) (29562-13086)  Problem # 7:  GERD (ICD-530.81)  Her updated medication list for this problem includes:    Zegerid 40-1100 Mg Caps (Omeprazole-sodium bicarbonate) .Marland Kitchen... 1 by mouth once daily  Orders: Venipuncture (57846) TLB-Lipid Panel (80061-LIPID) TLB-BMP (Basic Metabolic Panel-BMET) (80048-METABOL) TLB-CBC Platelet - w/Differential (85025-CBCD) TLB-Hepatic/Liver Function Pnl (80076-HEPATIC) TLB-TSH (Thyroid Stimulating Hormone) (84443-TSH) T-Vitamin D (25-Hydroxy) (96295-28413)  Diagnostics Reviewed:  Discussed lifestyle modifications, diet, antacids/medications, and preventive measures. Handout provided.   Complete Medication List: 1)  Lisinopril-hydrochlorothiazide 20-12.5 Mg Tabs (Lisinopril-hydrochlorothiazide) .Marland Kitchen.. 1 by mouth two times a day 2)  Toprol Xl 100 Mg Xr24h-tab (Metoprolol succinate) .Marland Kitchen.. 1 1/2 by mouth once daily 3)  Crestor 40 Mg Tabs (Rosuvastatin calcium) .... Take one tablet  nightly 4)  Zegerid 40-1100 Mg Caps (Omeprazole-sodium bicarbonate) .Marland Kitchen.. 1 by mouth once daily 5)  Alprazolam 0.25 Mg Tabs (Alprazolam) .Marland Kitchen.. 1 by mouth at bedtime as needed 6)  Citalopram Hydrobromide 20 Mg Tabs (Citalopram hydrobromide) .Marland Kitchen.. 1 by mouth once daily 7)  Astepro 0.15 % Soln (Azelastine hcl) .... 2 sprays 2 times a day after nettipot 8)  Remeron 45 Mg Tabs (Mirtazapine) .... Take one tablet every evening at bedtime.    Last Flu Vaccine:  Fluvax 3+ (07/31/2007 9:23:19 AM) Flu Vaccine Next Due:  Refused Last Mammogram:  ASSESSMENT: Negative - BI-RADS 1^MM DIGITAL SCREENING (01/21/2009 2:10:00 PM) Mammogram Result Date:  01/26/2010 Mammogram Result:  normal Mammogram Next Due:  1 yr   Laboratory Results   Urine Tests   Date/Time Reported: March 24, 2010 10:03 AM   Routine Urinalysis   Color: straw Appearance: Clear Glucose: negative   (Normal Range: Negative) Bilirubin: negative   (Normal Range: Negative) Ketone: negative   (Normal Range: Negative) Spec. Gravity: <1.005   (Normal Range: 1.003-1.035) Blood: negative   (Normal Range: Negative) pH: 8.0   (Normal Range: 5.0-8.0) Protein: 30   (Normal Range: Negative) Urobilinogen: negative   (Normal Range: 0-1) Nitrite: negative   (Normal Range: Negative) Leukocyte Esterace: negative   (Normal Range: Negative)    Comments: Floydene Flock  March 24, 2010 10:04 AM

## 2010-10-18 NOTE — Assessment & Plan Note (Signed)
Summary: DISCUSS THYROID/REFILL//KN   Vital Signs:  Patient profile:   65 year old female Height:      67 inches (170.18 cm) Weight:      177 pounds (80.45 kg) BMI:     27.82 Temp:     98.6 degrees F (37.00 degrees C) oral BP sitting:   132 / 80  (left arm) Cuff size:   regular  Vitals Entered By: Lucious Groves CMA (April 19, 2010 9:15 AM) CC: Discuss labs/med refill./kb Is Patient Diabetic? No Pain Assessment Patient in pain? no        History of Present Illness: Pt here to discuss labs.  She feels like her thyroid is getting bigger and is requesting a repeat US.   No complaints of difficulty swallowing etc.     Hypertension follow-up      This is a 65 year old woman who presents for Hypertension follow-up.  The patient denies lightheadedness, urinary frequency, headaches, edema, impotence, rash, and fatigue.  The patient denies the following associated symptoms: chest pain, chest pressure, exercise intolerance, dyspnea, palpitations, syncope, leg edema, and pedal edema.  Compliance with medications (by patient report) has been near 100%.  The patient reports that dietary compliance has been good.  The patient reports exercising 3-4X per week.  Adjunctive measures currently used by the patient include salt restriction.    Hyperlipidemia follow-up      The patient also presents for Hyperlipidemia follow-up.  The patient denies muscle aches, GI upset, abdominal pain, flushing, itching, constipation, diarrhea, and fatigue.  The patient denies the following symptoms: chest pain/pressure, exercise intolerance, dypsnea, palpitations, syncope, and pedal edema.  Compliance with medications (by patient report) has been near 100%.  Dietary compliance has been good.  The patient reports exercising 3-4X per week.  Adjunctive measures currently used by the patient include ASA.    Preventive Screening-Counseling & Management  Alcohol-Tobacco     Alcohol drinks/day: 0     Smoking Status:  never     Passive Smoke Exposure: no  Caffeine-Diet-Exercise     Caffeine use/day: 1     Diet Comments: Flat belly diet --  recc.     Diet Counseling: to improve diet; diet is suboptimal     Does Patient Exercise: yes     Type of exercise: walking     Exercise (avg: min/session): 30-60     Times/week: 3  Hep-HIV-STD-Contraception     Hepatitis Risk: no risk noted     HIV Risk: no     Dental Visit-last 6 months yes     Dental Care Counseling: not indicated; dental care within six months     SBE monthly: yes     SBE Education/Counseling: not indicated; SBE done regularly  Safety-Violence-Falls     Seat Belt Use: yes     Seat Belt Counseling: not indicated; patient wears seat belts     Firearms in the Home: no firearms in the home     Firearm Counseling: not applicable     Smoke Detectors: yes     Smoke Detector Counseling: no     Violence in the Home: no risk noted     Sexual Abuse: no  Current Medications (verified): 1)  Lisinopril-Hydrochlorothiazide 20-12.5 Mg Tabs (Lisinopril-Hydrochlorothiazide) .Marland Kitchen.. 1 By Mouth Two Times A Day 2)  Toprol Xl 100 Mg  Xr24h-Tab (Metoprolol Succinate) .Marland Kitchen.. 1 1/2 By Mouth Once Daily 3)  Crestor 40 Mg Tabs (Rosuvastatin Calcium) .... Take One Tablet Nightly 4)  Zegerid 40-1100 Mg  Caps (Omeprazole-Sodium Bicarbonate) .Marland Kitchen.. 1 By Mouth Once Daily 5)  Alprazolam 0.25 Mg  Tabs (Alprazolam) .Marland Kitchen.. 1 By Mouth At Bedtime As Needed 6)  Citalopram Hydrobromide 40 Mg Tabs (Citalopram Hydrobromide) .Marland Kitchen.. 1 By Mouth Qd 7)  Remeron 45 Mg Tabs (Mirtazapine) .... Take One Tablet Every Evening At Bedtime.  Allergies (verified): No Known Drug Allergies  Past History:  Past medical, surgical, family and social histories (including risk factors) reviewed for relevance to current acute and chronic problems.  Past Medical History: Reviewed history from 09/01/2008 and no changes required. Allergic rhinitis GERD Hypertension Hyperlipidemia Anxiety  Past  Surgical History: Reviewed history from 02/07/2007 and no changes required. 4 CHILDBIRTHS  Family History: Reviewed history from 12/31/2007 and no changes required. Family History High cholesterol Family History Hypertension F--Copd M--HTN  Social History: Reviewed history from 03/24/2010 and no changes required.  Drug use-no Regular exercise-yes Married Never Smoked Alcohol use-yes Retired  Review of Systems      See HPI  Physical Exam  General:  Well-developed,well-nourished,in no acute distress; alert,appropriate and cooperative throughout examination Neck:  + goiter Lungs:  Normal respiratory effort, chest expands symmetrically. Lungs are clear to auscultation, no crackles or wheezes. Heart:  normal rate and no murmur.   Extremities:  No clubbing, cyanosis, edema, or deformity noted with normal full range of motion of all joints.   Cervical Nodes:  No lymphadenopathy noted Psych:  Cognition and judgment appear intact. Alert and cooperative with normal attention span and concentration. No apparent delusions, illusions, hallucinations   Impression & Recommendations:  Problem # 1:  GOITER NOS (ICD-240.9)  Orders: Radiology Referral (Radiology)  Problem # 2:  HYPERLIPIDEMIA (ICD-272.4)  Her updated medication list for this problem includes:    Crestor 40 Mg Tabs (Rosuvastatin calcium) .Marland Kitchen... Take one tablet nightly  Labs Reviewed: SGOT: 25 (03/24/2010)   SGPT: 20 (03/24/2010)   HDL:47.10 (03/24/2010), 48.40 (01/06/2009)  LDL:74 (03/24/2010), 122 (16/06/9603)  Chol:144 (03/24/2010), 201 (01/06/2009)  Trig:113.0 (03/24/2010), 103.0 (01/06/2009)  Problem # 3:  HYPERTENSION (ICD-401.9)  Her updated medication list for this problem includes:    Lisinopril-hydrochlorothiazide 20-12.5 Mg Tabs (Lisinopril-hydrochlorothiazide) .Marland Kitchen... 1 by mouth two times a day    Toprol Xl 100 Mg Xr24h-tab (Metoprolol succinate) .Marland Kitchen... 1 1/2 by mouth once daily  BP today: 132/80 Prior  BP: 118/80 (03/24/2010)  Labs Reviewed: K+: 4.2 (03/24/2010) Creat: : 0.9 (03/24/2010)   Chol: 144 (03/24/2010)   HDL: 47.10 (03/24/2010)   LDL: 74 (03/24/2010)   TG: 113.0 (03/24/2010)  Complete Medication List: 1)  Lisinopril-hydrochlorothiazide 20-12.5 Mg Tabs (Lisinopril-hydrochlorothiazide) .Marland Kitchen.. 1 by mouth two times a day 2)  Toprol Xl 100 Mg Xr24h-tab (Metoprolol succinate) .Marland Kitchen.. 1 1/2 by mouth once daily 3)  Crestor 40 Mg Tabs (Rosuvastatin calcium) .... Take one tablet nightly 4)  Zegerid 40-1100 Mg Caps (Omeprazole-sodium bicarbonate) .Marland Kitchen.. 1 by mouth once daily 5)  Alprazolam 0.25 Mg Tabs (Alprazolam) .Marland Kitchen.. 1 by mouth at bedtime as needed 6)  Citalopram Hydrobromide 40 Mg Tabs (Citalopram hydrobromide) .Marland Kitchen.. 1 by mouth qd 7)  Remeron 45 Mg Tabs (Mirtazapine) .... Take one tablet every evening at bedtime.  Patient Instructions: 1)  Please schedule a follow-up appointment in 6 months--- labs-- 2)  401.9  272.4  lipid, hep, bmp Prescriptions: ZEGERID 40-1100 MG  CAPS (OMEPRAZOLE-SODIUM BICARBONATE) 1 by mouth once daily  #90 x 3   Entered and Authorized by:   Loreen Freud DO   Signed by:   Loreen Freud DO  on 04/19/2010   Method used:   Electronically to        [facility] #[id number]* (retail)       [address] [place], [place]  [address]       Ph: [phone]       Fax: [phone]   RxID:   [id number] CRESTOR 40 MG TABS (ROSUVASTATIN CALCIUM) Take one tablet nightly  #90 x 3   Entered and Authorized by:   Loreen Freud DO   Signed by:   Loreen Freud DO on 04/19/2010   Method used:   Electronically to        [facility] #[id number]* (retail)       [address] [place], [place]  [address]       Ph: [phone]       Fax: [phone]   RxID:   [id number] TOPROL XL 100 MG  XR24H-TAB (METOPROLOL SUCCINATE) 1 1/2 by mouth once daily  #135 x 3   Entered and Authorized by:   Loreen Freud DO   Signed by:   Loreen Freud DO on 04/19/2010    Method used:   Electronically to        [facility] #[id number]* (retail)       [address] [place], [place]  [address]       Ph: [phone]       Fax: [phone]   RxID:   [id number] LISINOPRIL-HYDROCHLOROTHIAZIDE 20-12.5 MG TABS (LISINOPRIL-HYDROCHLOROTHIAZIDE) 1 by mouth two times a day  #180 x 3   Entered and Authorized by:   Loreen Freud DO   Signed by:   Loreen Freud DO on 04/19/2010   Method used:   Electronically to        [facility] #[id number]* (retail)       [address] [place], [place]  [address]       Ph: [phone]       Fax: [phone]   RxID:   [id number]

## 2010-10-20 ENCOUNTER — Ambulatory Visit: Admit: 2010-10-20 | Payer: Self-pay | Admitting: Family Medicine

## 2010-10-20 ENCOUNTER — Ambulatory Visit: Payer: Self-pay | Admitting: Family Medicine

## 2010-10-26 NOTE — Assessment & Plan Note (Signed)
Summary: SINUS/KN   Vital Signs:  Patient profile:   65 year old female Weight:      179.6 pounds Temp:     98.5 degrees F oral BP sitting:   130 / 90  (left arm) Cuff size:   regular  Vitals Entered By: Almeta Monas CMA Duncan Dull) (October 17, 2010 11:36 AM) CC: x2weeks c/o cough, sinus pressure/drainage, itchy throat and ears, URI symptoms   History of Present Illness:       This is a 65 year old woman who presents with URI symptoms.  The symptoms began 2 weeks ago.  The patient complains of nasal congestion, purulent nasal discharge, sore throat, productive cough, and sick contacts.  The patient denies fever, low-grade fever (<100.5 degrees), fever of 100.5-103 degrees, fever of 103.1-104 degrees, fever to >104 degrees, stiff neck, dyspnea, wheezing, rash, vomiting, diarrhea, use of an antipyretic, and response to antipyretic.  The patient also reports itchy throat, sneezing, and headache.  The patient denies itchy watery eyes, seasonal symptoms, response to antihistamine, muscle aches, and severe fatigue.  The patient denies the following risk factors for Strep sinusitis: unilateral facial pain, unilateral nasal discharge, poor response to decongestants.   + B/L sinus pressure   Current Medications (verified): 1)  Lisinopril-Hydrochlorothiazide 20-12.5 Mg Tabs (Lisinopril-Hydrochlorothiazide) .Marland Kitchen.. 1 By Mouth Two Times A Day 2)  Toprol Xl 100 Mg  Xr24h-Tab (Metoprolol Succinate) .Marland Kitchen.. 1 1/2 By Mouth Once Daily 3)  Lipitor 40 Mg Tabs (Atorvastatin Calcium) .Marland Kitchen.. 1 By Mouth At Bedtime 4)  Zegerid 40-1100 Mg  Caps (Omeprazole-Sodium Bicarbonate) .Marland Kitchen.. 1 By Mouth Once Daily 5)  Alprazolam 0.25 Mg  Tabs (Alprazolam) .Marland Kitchen.. 1 By Mouth At Bedtime As Needed 6)  Citalopram Hydrobromide 40 Mg Tabs (Citalopram Hydrobromide) .Marland Kitchen.. 1 By Mouth Qd 7)  Remeron 45 Mg Tabs (Mirtazapine) .... Take One Tablet Every Evening At Bedtime. 8)  Augmentin 875-125 Mg Tabs (Amoxicillin-Pot Clavulanate) .Marland Kitchen.. 1 By Mouth Two  Times A Day 9)  Cheratussin Ac 100-10 Mg/48ml Syrp (Guaifenesin-Codeine) .Marland Kitchen.. 1-2 Tsp By Mouth At Bedtime As Needed  Allergies (verified): No Known Drug Allergies  Past History:  Past Medical History: Last updated: 09/01/2008 Allergic rhinitis GERD Hypertension Hyperlipidemia Anxiety  Past Surgical History: Last updated: 02/07/2007 4 CHILDBIRTHS  Family History: Last updated: 12/31/2007 Family History High cholesterol Family History Hypertension F--Copd M--HTN  Social History: Last updated: 03/24/2010  Drug use-no Regular exercise-yes Married Never Smoked Alcohol use-yes Retired  Risk Factors: Alcohol Use: 0 (04/19/2010) Caffeine Use: 1 (04/19/2010) Diet: Flat belly diet --  recc. (04/19/2010) Exercise: yes (04/19/2010)  Risk Factors: Smoking Status: never (04/19/2010) Passive Smoke Exposure: no (04/19/2010)  Family History: Reviewed history from 12/31/2007 and no changes required. Family History High cholesterol Family History Hypertension F--Copd M--HTN  Social History: Reviewed history from 03/24/2010 and no changes required.  Drug use-no Regular exercise-yes Married Never Smoked Alcohol use-yes Retired  Review of Systems      See HPI  Physical Exam  General:  Well-developed,well-nourished,in no acute distress; alert,appropriate and cooperative throughout examination Ears:  External ear exam shows no significant lesions or deformities.  Otoscopic examination reveals clear canals, tympanic membranes are intact bilaterally without bulging, retraction, inflammation or discharge. Hearing is grossly normal bilaterally. Nose:  L frontal sinus tenderness, L maxillary sinus tenderness, R frontal sinus tenderness, and R maxillary sinus tenderness.   Mouth:  Oral mucosa and oropharynx without lesions or exudates.  Teeth in good repair. Neck:  No deformities, masses, or tenderness noted. Lungs:  Normal respiratory effort, chest expands symmetrically.  Lungs are clear to auscultation, no crackles or wheezes. Heart:  normal rate and no murmur.   Psych:  Oriented X3 and normally interactive.     Impression & Recommendations:  Problem # 1:  SINUSITIS- ACUTE-NOS (ICD-461.9)  Her updated medication list for this problem includes:    Augmentin 875-125 Mg Tabs (Amoxicillin-pot clavulanate) .Marland Kitchen... 1 by mouth two times a day    Cheratussin Ac 100-10 Mg/56ml Syrp (Guaifenesin-codeine) .Marland Kitchen... 1-2 tsp by mouth at bedtime as needed    Flonase 50 Mcg/act Susp (Fluticasone propionate) .Marland Kitchen... 2 sprays each nostril once daily     use flonase--pt has at home  Instructed on treatment. Call if symptoms persist or worsen.   Complete Medication List: 1)  Lisinopril-hydrochlorothiazide 20-12.5 Mg Tabs (Lisinopril-hydrochlorothiazide) .Marland Kitchen.. 1 by mouth two times a day 2)  Toprol Xl 100 Mg Xr24h-tab (Metoprolol succinate) .Marland Kitchen.. 1 1/2 by mouth once daily 3)  Lipitor 40 Mg Tabs (Atorvastatin calcium) .Marland Kitchen.. 1 by mouth at bedtime 4)  Zegerid 40-1100 Mg Caps (Omeprazole-sodium bicarbonate) .Marland Kitchen.. 1 by mouth once daily 5)  Alprazolam 0.25 Mg Tabs (Alprazolam) .Marland Kitchen.. 1 by mouth at bedtime as needed 6)  Citalopram Hydrobromide 40 Mg Tabs (Citalopram hydrobromide) .Marland Kitchen.. 1 by mouth qd 7)  Remeron 45 Mg Tabs (Mirtazapine) .... Take one tablet every evening at bedtime. 8)  Augmentin 875-125 Mg Tabs (Amoxicillin-pot clavulanate) .Marland Kitchen.. 1 by mouth two times a day 9)  Cheratussin Ac 100-10 Mg/25ml Syrp (Guaifenesin-codeine) .Marland Kitchen.. 1-2 tsp by mouth at bedtime as needed 10)  Flonase 50 Mcg/act Susp (Fluticasone propionate) .... 2 sprays each nostril once daily  Patient Instructions: 1)  Acute sinusitis symptoms for less than 10 days are not helped by antibiotics. Use warm moist compresses, and over the counter decongestants( only as directed). Call if no improvement in 5-7 days, sooner if increasing pain, fever, or new symptoms.  Prescriptions: CHERATUSSIN AC 100-10 MG/5ML SYRP  (GUAIFENESIN-CODEINE) 1-2 tsp by mouth at bedtime as needed  #6 oz x 0   Entered and Authorized by:   Loreen Freud DO   Signed by:   Loreen Freud DO on 10/17/2010   Method used:   Print then Give to Patient   RxID:   [id number] AUGMENTIN 875-125 MG TABS (AMOXICILLIN-POT CLAVULANATE) 1 by mouth two times a day  #20 x 0   Entered and Authorized by:   Loreen Freud DO   Signed by:   Loreen Freud DO on 10/17/2010   Method used:   Electronically to        [facility] #[id number]* (retail)       [address] [place], [place]  [address]       Ph: [phone]       Fax: [phone]   RxID:   [id number] LIPITOR 40 MG TABS (ATORVASTATIN CALCIUM) 1 by mouth at bedtime  #30 x 5   Entered and Authorized by:   Loreen Freud DO   Signed by:   Loreen Freud DO on 10/17/2010   Method used:   Electronically to        [facility] #[id number]* (retail)       [address] [place], [place]  [address]       Ph: [phone]       Fax: [phone]   RxID:   [id number]    Orders Added: 1)  Est. Patient Level  III [id number]

## 2010-10-27 ENCOUNTER — Ambulatory Visit: Payer: Self-pay | Admitting: Family Medicine

## 2010-11-09 ENCOUNTER — Encounter: Payer: Self-pay | Admitting: Family Medicine

## 2010-11-18 ENCOUNTER — Encounter: Payer: Self-pay | Admitting: Family Medicine

## 2010-11-18 ENCOUNTER — Other Ambulatory Visit: Payer: Self-pay | Admitting: Family Medicine

## 2010-11-18 ENCOUNTER — Ambulatory Visit (INDEPENDENT_AMBULATORY_CARE_PROVIDER_SITE_OTHER): Payer: BC Managed Care – PPO | Admitting: Family Medicine

## 2010-11-18 DIAGNOSIS — M546 Pain in thoracic spine: Secondary | ICD-10-CM | POA: Insufficient documentation

## 2010-11-18 DIAGNOSIS — E785 Hyperlipidemia, unspecified: Secondary | ICD-10-CM

## 2010-11-18 DIAGNOSIS — E049 Nontoxic goiter, unspecified: Secondary | ICD-10-CM

## 2010-11-18 DIAGNOSIS — I10 Essential (primary) hypertension: Secondary | ICD-10-CM

## 2010-11-18 DIAGNOSIS — F341 Dysthymic disorder: Secondary | ICD-10-CM

## 2010-11-18 LAB — HEPATIC FUNCTION PANEL
ALT: 46 U/L — ABNORMAL HIGH (ref 0–35)
AST: 35 U/L (ref 0–37)
Total Protein: 7.3 g/dL (ref 6.0–8.3)

## 2010-11-18 LAB — TSH: TSH: 1.51 u[IU]/mL (ref 0.35–5.50)

## 2010-11-18 LAB — BASIC METABOLIC PANEL
BUN: 18 mg/dL (ref 6–23)
CO2: 30 mEq/L (ref 19–32)
Chloride: 100 mEq/L (ref 96–112)
Creatinine, Ser: 1.2 mg/dL (ref 0.4–1.2)
Glucose, Bld: 86 mg/dL (ref 70–99)
Potassium: 4.2 mEq/L (ref 3.5–5.1)

## 2010-11-18 LAB — LIPID PANEL
Cholesterol: 149 mg/dL (ref 0–200)
Triglycerides: 75 mg/dL (ref 0.0–149.0)

## 2010-11-18 LAB — T3, FREE: T3, Free: 2.7 pg/mL (ref 2.3–4.2)

## 2010-11-24 NOTE — Assessment & Plan Note (Signed)
Summary: 6 month ov/ph   Vital Signs:  Patient profile:   65 year old female Weight:      177.8 pounds Pulse rate:   60 / minute Pulse rhythm:   regular BP sitting:   128 / 72  (left arm) Cuff size:   large  Vitals Entered By: Almeta Monas CMA Duncan Dull) (November 18, 2010 9:25 AM) CC: 6 mo fu--denies any problems   History of Present Illness:  Hyperlipidemia follow-up      This is a 65 year old woman who presents for Hyperlipidemia follow-up.  The patient denies muscle aches, GI upset, abdominal pain, flushing, itching, constipation, diarrhea, and fatigue.  The patient denies the following symptoms: chest pain/pressure, exercise intolerance, dypsnea, palpitations, syncope, and pedal edema.  Compliance with medications (by patient report) has been near 100%.  Dietary compliance has been good.  The patient reports no exercise.  Adjunctive measures currently used by the patient include ASA, fish oil supplements, and weight reduction.    Hypertension follow-up      The patient also presents for Hypertension follow-up.  The patient denies lightheadedness, urinary frequency, headaches, edema, impotence, rash, and fatigue.  The patient denies the following associated symptoms: chest pain, chest pressure, exercise intolerance, dyspnea, palpitations, syncope, leg edema, and pedal edema.  Compliance with medications (by patient report) has been near 100%.  The patient reports that dietary compliance has been good.  The patient reports no exercise.  Adjunctive measures currently used by the patient include salt restriction.    Current Medications (verified): 1)  Lisinopril-Hydrochlorothiazide 20-12.5 Mg Tabs (Lisinopril-Hydrochlorothiazide) .Marland Kitchen.. 1 By Mouth Two Times A Day 2)  Toprol Xl 100 Mg  Xr24h-Tab (Metoprolol Succinate) .Marland Kitchen.. 1 1/2 By Mouth Once Daily 3)  Lipitor 40 Mg Tabs (Atorvastatin Calcium) .Marland Kitchen.. 1 By Mouth At Bedtime 4)  Zegerid 40-1100 Mg  Caps (Omeprazole-Sodium Bicarbonate) .Marland Kitchen.. 1 By Mouth  Once Daily 5)  Alprazolam 0.25 Mg  Tabs (Alprazolam) .Marland Kitchen.. 1 By Mouth At Bedtime As Needed 6)  Citalopram Hydrobromide 40 Mg Tabs (Citalopram Hydrobromide) .Marland Kitchen.. 1 By Mouth Qd 7)  Flexeril 10 Mg Tabs (Cyclobenzaprine Hcl) .Marland Kitchen.. 1 By Mouth Three Times A Day As Needed  Allergies (verified): No Known Drug Allergies  Past History:  Past medical, surgical, family and social histories (including risk factors) reviewed for relevance to current acute and chronic problems.  Past Medical History: Reviewed history from 09/01/2008 and no changes required. Allergic rhinitis GERD Hypertension Hyperlipidemia Anxiety  Past Surgical History: Reviewed history from 02/07/2007 and no changes required. 4 CHILDBIRTHS  Family History: Reviewed history from 12/31/2007 and no changes required. Family History High cholesterol Family History Hypertension F--Copd M--HTN  Social History: Reviewed history from 03/24/2010 and no changes required.  Drug use-no Regular exercise-yes Married Never Smoked Alcohol use-yes Retired  Review of Systems      See HPI  Physical Exam  General:  Well-developed,well-nourished,in no acute distress; alert,appropriate and cooperative throughout examination Neck:  No deformities, masses, or tenderness noted. Lungs:  Normal respiratory effort, chest expands symmetrically. Lungs are clear to auscultation, no crackles or wheezes. Heart:  normal rate and no murmur.   Msk:  + muscle spasms/ knot L trap / scapula  Psych:  Oriented X3 and normally interactive.  Oriented X3 and normally interactive.     Impression & Recommendations:  Problem # 1:  ANXIETY (ICD-300.00)  The following medications were removed from the medication list:    Remeron 45 Mg Tabs (Mirtazapine) .Marland Kitchen... Take one tablet  every evening at bedtime. Her updated medication list for this problem includes:    Alprazolam 0.25 Mg Tabs (Alprazolam) .Marland Kitchen... 1 by mouth at bedtime as needed    Citalopram  Hydrobromide 40 Mg Tabs (Citalopram hydrobromide) .Marland Kitchen... 1 by mouth qd  Discussed medication use and relaxation techniques.   Problem # 2:  GOITER NOS (ICD-240.9)  Orders: Venipuncture ([address]109) TLB-Lipid Panel (80061-LIPID) TLB-BMP (Basic Metabolic Panel-BMET) (80048-METABOL) TLB-Hepatic/Liver Function Pnl (80076-HEPATIC) TLB-TSH (Thyroid Stimulating Hormone) (84443-TSH) TLB-T3, Free (Triiodothyronine) (84481-T3FREE) TLB-T4 (Thyrox), Free (60454-UJ8J) Specimen Handling (19147)  Problem # 3:  HYPERLIPIDEMIA (ICD-272.4)  Her updated medication list for this problem includes:    Lipitor 40 Mg Tabs (Atorvastatin calcium) .Marland Kitchen... 1 by mouth at bedtime  Orders: Venipuncture (82956) TLB-Lipid Panel (80061-LIPID) TLB-BMP (Basic Metabolic Panel-BMET) (80048-METABOL) TLB-Hepatic/Liver Function Pnl (80076-HEPATIC) TLB-TSH (Thyroid Stimulating Hormone) (84443-TSH) TLB-T3, Free (Triiodothyronine) (84481-T3FREE) TLB-T4 (Thyrox), Free [phone]) Specimen Handling (46962)  Labs Reviewed: SGOT: 25 (03/24/2010)   SGPT: 20 (03/24/2010)   HDL:47.10 (03/24/2010), 48.40 (01/06/2009)  LDL:74 (03/24/2010), 122 (95/28/4132)  Chol:144 (03/24/2010), 201 (01/06/2009)  Trig:113.0 (03/24/2010), 103.0 (01/06/2009)  Problem # 4:  HYPERTENSION (ICD-401.9)  Her updated medication list for this problem includes:    Lisinopril-hydrochlorothiazide 20-12.5 Mg Tabs (Lisinopril-hydrochlorothiazide) .Marland Kitchen... 1 by mouth two times a day    Toprol Xl 100 Mg Xr24h-tab (Metoprolol succinate) .Marland Kitchen... 1 1/2 by mouth once daily  Orders: Venipuncture (44010) TLB-Lipid Panel (80061-LIPID) TLB-BMP (Basic Metabolic Panel-BMET) (80048-METABOL) TLB-Hepatic/Liver Function Pnl (80076-HEPATIC) TLB-TSH (Thyroid Stimulating Hormone) (84443-TSH) TLB-T3, Free (Triiodothyronine) (84481-T3FREE) TLB-T4 (Thyrox), Free [phone] Specimen Handling (03474)  BP today: 128/72 Prior BP: 130/90 (10/17/2010)  Labs Reviewed: K+: 4.2  (03/24/2010) Creat: : 0.9 (03/24/2010)   Chol: 144 (03/24/2010)   HDL: 47.10 (03/24/2010)   LDL: 74 (03/24/2010)   TG: 113.0 (03/24/2010)  Problem # 5:  BACK PAIN, THORACIC REGION, LEFT (ICD-724.1)  Her updated medication list for this problem includes:    Flexeril 10 Mg Tabs (Cyclobenzaprine hcl) .Marland Kitchen... 1 by mouth three times a day as needed  Discussed use of moist heat or ice, modified activities, medications, and stretching/strengthening exercises. Back care instructions given. To be seen in 2 weeks if no improvement; sooner if worsening of symptoms.   Complete Medication List: 1)  Lisinopril-hydrochlorothiazide 20-12.5 Mg Tabs (Lisinopril-hydrochlorothiazide) .Marland Kitchen.. 1 by mouth two times a day 2)  Toprol Xl 100 Mg Xr24h-tab (Metoprolol succinate) .Marland Kitchen.. 1 1/2 by mouth once daily 3)  Lipitor 40 Mg Tabs (Atorvastatin calcium) .Marland Kitchen.. 1 by mouth at bedtime 4)  Zegerid 40-1100 Mg Caps (Omeprazole-sodium bicarbonate) .Marland Kitchen.. 1 by mouth once daily 5)  Alprazolam 0.25 Mg Tabs (Alprazolam) .Marland Kitchen.. 1 by mouth at bedtime as needed 6)  Citalopram Hydrobromide 40 Mg Tabs (Citalopram hydrobromide) .Marland Kitchen.. 1 by mouth qd 7)  Flexeril 10 Mg Tabs (Cyclobenzaprine hcl) .Marland Kitchen.. 1 by mouth three times a day as needed Prescriptions: FLEXERIL 10 MG TABS (CYCLOBENZAPRINE HCL) 1 by mouth three times a day as needed  #30 x 0   Entered and Authorized by:   Loreen Freud DO   Signed by:   Loreen Freud DO on 11/18/2010   Method used:   Electronically to        [facility] #[id number]* (retail)       [address] [place], [place]  [address]       Ph: [phone]       Fax: [phone]   RxID:   [id number] ALPRAZOLAM 0.25 MG  TABS (ALPRAZOLAM)  1 by mouth at bedtime as needed  #90 x 0   Entered and Authorized by:   Loreen Freud DO   Signed by:   Loreen Freud DO on 11/18/2010   Method used:   Print then Give to Patient   RxID:   [id number]    Orders Added: 1)  Venipuncture [95621] 2)  TLB-Lipid Panel  [80061-LIPID] 3)  TLB-BMP (Basic Metabolic Panel-BMET) [80048-METABOL] 4)  TLB-Hepatic/Liver Function Pnl [80076-HEPATIC] 5)  TLB-TSH (Thyroid Stimulating Hormone) [84443-TSH] 6)  TLB-T3, Free (Triiodothyronine) [30865-H8IONG] 7)  TLB-T4 (Thyrox), Free [29528-UX3K] 8)  Specimen Handling [99000] 9)  Est. Patient Level III [44010]

## 2010-12-02 ENCOUNTER — Encounter (INDEPENDENT_AMBULATORY_CARE_PROVIDER_SITE_OTHER): Payer: Self-pay | Admitting: *Deleted

## 2010-12-02 ENCOUNTER — Encounter: Payer: Self-pay | Admitting: Family Medicine

## 2010-12-02 ENCOUNTER — Other Ambulatory Visit (INDEPENDENT_AMBULATORY_CARE_PROVIDER_SITE_OTHER): Payer: BC Managed Care – PPO

## 2010-12-02 DIAGNOSIS — R7402 Elevation of levels of lactic acid dehydrogenase (LDH): Secondary | ICD-10-CM

## 2011-01-31 NOTE — H&P (Signed)
NAME:  Wendy Christensen, CROLL NO.:  [id number]   MEDICAL RECORD NO.:  [id number]          PATIENT TYPE:  IPS   LOCATION:  0306                          FACILITY:  [facility]   PHYSICIAN:  Jasmine Pang, M.D. DATE OF BIRTH:  Mar 27, 1946   DATE OF ADMISSION:  07/20/2008  DATE OF DISCHARGE:                       PSYCHIATRIC ADMISSION ASSESSMENT   IDENTIFICATION:  A 65 year old African-American female, married.  This  is a voluntary admission.   HISTORY OF PRESENT ILLNESS:  First inpatient admission for this 62-year-  old African-American female who initially presented in the emergency  room.  She was taken there by her friend, tearful, after feeling  stressed out over her home situation.  Married for 10 years now, this is  her second marriage, husband has been more distant for the past 8-9  months and she feels that it is getting worse.  She has tried to  confront him about his activity in the marriage and he denies any  problems.  The patient suspects that he is having an affair with a  friend of hers.  Husband has insinuated that the problem is all in her  imagination.  She feels communication between the two them is  deteriorating and is frustrated in the relationship and has had some  thoughts of wanting to hurt him, although she denies any intent to  actually do so.  Also endorses increased depression since July, 2009  when  her work shift was changed from second shift which she been on for  many years to the third shift from  10 p.m. to 6 a.m.  She reports significant impairment in her sleep since  that time, inability to sleep more than 4 hours per day with frequent  awakenings, appetite is poor, concentration is decreased for the past 2  weeks. She no longer has interest in baking, keeping house or doing her  usual decorating and cleaning.  Having tearful episodes two to three  times a week and endorses passive suicidal thoughts, feeling she can no  longer go on like  this.  She does not have a plan for suicide, denies  any substance abuse.   PAST PSYCHIATRIC HISTORY:  First inpatient admission.  She is currently  seeing a Dr. Scherrie Merritts at the [facility] and is followed for  Lexapro by her primary care practitioner.  She reports one prior episode  of depression about 8 or so years ago when she was beginning menopause.  During that time she was having increasing irritability, decreased  concentration, and was started on Paxil which she eventually stopped  after several months because of significant weight gain.  Currently on  Lexapro for 3 weeks, she does feel it is helping to calm some of her  anxiety.  Denies a history of substance abuse.  Denies a history of  learning disability, brain injury, coma or memory loss.   SOCIAL HISTORY:  Married for 10 years now in her second marriage.  She  has four children by her first marriage ages 20, 76, 70 and 39, along  with eight grandchildren.  She has a daughter here who was particularly  supportive and lives nearby.  She works as a Museum/gallery curator and having recent problems with sleep disorder  following shift change.   FAMILY HISTORY:  Son in recovery from cocaine abuse for 4 years.   MEDICAL HISTORY:  Primary care practitioner is Dr. Loreen Freud at  [facility] on [place]. Current medical problems are  hypertension and GERD.   CURRENT MEDICATIONS:  Alprazolam 0.25 mg 1/2 to 1 tablet as needed  p.r.n. at bedtime, Zegerid 1 tablet daily upon awakening, quinapril/HCTZ  30/25 mg p.o. daily, simvastatin 80 mg daily, metoprolol 100 mg daily.   DRUG ALLERGIES:  None.   PHYSICAL EXAMINATION:  Physical exam was done in the emergency room at  [facility].  GENERAL:  This is a healthy-appearing, medium-built African-American  female in no distress.  VITAL SIGNS:  Height 5 feet tall, 5 inches, weight 156 pounds,  temperature 97.9, pulse 66, respirations 16,  blood pressure 137/74.   Diagnostic studies were done in the emergency room.  CBC:  WBC 6.9,  hemoglobin 12.7, hematocrit 39.3, platelets 334,000.  Chemistry:  Sodium  142, potassium 3.3, chloride 100, carbon dioxide 29, BUN 15, creatinine  1.1 and random glucose 100, calcium 10.1.  Urine drug screen is negative  for all substances.  Alcohol level less than 5.   MENTAL STATUS EXAM:  Fully alert, pleasant cooperative, good eye  contact, readily tearful when talking about the stress in her marriage.  Speech is normal.  Speech is articulate.  She gives a coherent history.  Mood depressed.  Experiencing a lot of frustration in the marriage.  Feels that the relationship is deteriorating and she is helpless to stop  that.  Thought processes logical and coherent.  She has had thoughts of  just wanting to slap  or hit her husband but denies any intent of really  doing that.  No plan for suicide but feeling she is unable to  concentrate at work, crying constantly and just not herself.  Cognition  is fully preserved.  Insight is adequate.  Impulse control and judgment  are within normal limits.   ASSESSMENT:  AXIS I:  Major depressive episode, NOS.  AXIS II:  Deferred.  AXIS III:  Gastroesophageal reflux disease.  Hypertension.  AXIS IV:  Severe issues with marital discord and significant  occupational stress with change in shift.  AXIS V:  Current 46, past year not known.   PLAN:  Voluntarily admit her to alleviate her suicidal thoughts.  We are  going to start her on Rozarem 8 mg daily which is indicated for insomnia  and we are going to continue her Lexapro 10 mg daily.  We are planning  for a family session with her daughter and hopefully also to get her  husband involved in treatment.  Meanwhile we are going to check a TSH  and a routine urinalysis.      Margaret A. Lorin Picket, N.P.      Jasmine Pang, M.D.  Electronically Signed    MAS/MEDQ  D:  07/21/2008  T:  07/21/2008   Job:  [id number]

## 2011-01-31 NOTE — Discharge Summary (Signed)
NAME:  AKAIYA, TOUCHETTE NO.:  [id number]   MEDICAL RECORD NO.:  [id number]          PATIENT TYPE:  IPS   LOCATION:  0306                          FACILITY:  [facility]   PHYSICIAN:  Jasmine Pang, M.D. DATE OF BIRTH:  11-24-1945   DATE OF ADMISSION:  07/20/2008  DATE OF DISCHARGE:  07/26/2008                               DISCHARGE SUMMARY   IDENTIFICATION:  This is a 65 year old married African American female  who was admitted on a voluntary basis on July 20, 2008.   HISTORY OF PRESENT ILLNESS:  This is the first inpatient admission for  this 65 year old African American female who initially presented in the  emergency room.  She was taken there by her friend.  She was tearful  after feeling stressed out over her home situation.  She has been  married for 10 years now.  This is her second marriage.  Husband has  been more distinct for the past 8-9 months and she feels that it is  getting worse.  She is trying to confront him about his activity in the  marriage and he denies any problems.  The patient suspect that he is  having an affair with a friend of hers.  Husband has an insinuated that  the problem is all in her imagination.  She feels communication between  the two of them is deteriorating and is frustrated in the relationship.  She has some thoughts of wanting to hurt him, although she denies any  intent of actually doing so.  She also endorses increased depression  since July 2009 when her work shift was changed from second shift.  She  had been on this for many years and it was changed to third shift 10  p.m. to 6 a.m.  She reports significant impairment in her sleep since  that time and inability to sleep more than 4 hours per day with frequent  awakenings.  Her appetite is poor.  Concentration is decreased for the  past 2 weeks.  She no longer has an interest in baking, keeping house or  doing her usual decorating and cleaning.  She has been having  tearful  episodes 2 to 3 times a week.  She endorses passive suicidal thoughts.  She is feeling she can no longer go on like this.  She does not have a  plan for suicide.  She denies any substance abuse.   PAST PSYCHIATRIC HISTORY:  This is the first inpatient admission for the  patient.  She is currently seeing a Dr. Kellie Simmering at the [facility] on  [address] and is followed by her primary care doctor.  She is treated  with Lexapro.  She reports one history of depression about 8 or so years  ago when she was beginning menopause.  During that time, she was having  increasing irritability, decreased concentration, and was started on  Paxil, which she eventually stopped after several months because of  significant weight gain.  She is currently been on Lexapro for the past  3 weeks.  She does feel it is helping to calm some of her anxiety.  She  denies a  history of substance abuse.  She denies a history of learning  disability, brain injury, or memory loss.   FAMILY HISTORY:  The patient's son is in recovery from cocaine abuse for  4 years.   CURRENT MEDICAL PROBLEMS:  Hypertension and GERD.   CURRENT MEDICATIONS:  1. Alprazolam 0.25 mg 1-1/2 to 1 tablet as needed p.r.n.  2. Zegerid 1 tablet daily upon awakening.  3. Quinapril/hydrochlorothiazide 30/25 mg daily.  4. Simvastatin 80 mg daily.  5. Metoprolol 100 mg daily.   DRUG ALLERGIES:  None.   PHYSICAL FINDINGS:  There were no acute physical or medical problems.  Her physical exam was done in the emergency room in [facility].   DIAGNOSTIC STUDIES:  Done in the emergency room.  CBC revealed a WBC of  6.9, hemoglobin of 12.7, hematocrit of 39.3, and platelets 334,000.  Chemistries revealed a sodium of 142, potassium of 3.3, chloride 100,  carbon dioxide 29, BUN 15, and creatinine 1.1.  Random glucose was 100  and calcium was 10.1.  Urine drug screen was negative for all  substances.  Alcohol level was less than 5.   HOSPITAL  COURSE:  Upon admission, the patient was continued on her home  medications of quinapril/hydrochlorothiazide 30/25 mg p.o. daily,  alprazolam 0.25 mg p.o. t.i.d., simvastatin 80 mg p.o. daily, metoprolol  50 mg p.o. q. day, and Zegerid 1 tablet p.o. q. day.  She was also  started on trazodone 150 mg 1-/2 to 1 tablet p.o. q.h.s., may repeat x1.  She was restarted on Lexapro 10 mg p.o. daily.  She felt this was  helping.  She was also started on Rozerem 8 mg p.o. q.h.s.  In  individual sessions, the patient was reserved, but cooperative.  She did  participate appropriately in unit therapeutic groups and activities.  She discussed having no treatment for depression.  She has lost her  interest in her usual activities.  She is anxious with positive sleep  disturbance, poor concentration.  She began to get depressed when  menopause started.  She felt the Paxil worked well because of weight  gain.  She was put on Xanax by her primary care physician, but does not  use this much.  On July 22, 2008, the patient was having a lot of  difficulty sleeping.  She was anxious about her upcoming family session  with her husband.  Rozerem was discontinued.  She began on Ambien 5 mg  p.o. q.h.s. p.r.n. may repeat x1.  She was also given 0.5 mg x1 of  Ativan before her family session.  The Xanax was discontinued as she did  not feel this was helpful.  She was also started on Abilify 10 mg p.o.  q.h.s. as an adjunct to her antidepressant.  The patient had a family  session with her husband on July 22, 2008.  She indicated she is  feeling a little better than she did when first admitted.  However, she  reported she is still depressed and anxious.  She reports getting  depressed because she was bumped to third shift.  After having been on  first shift for over a year.  She states she was thought her husband was  cheating on her, husband denied accusation.  However, the patient  reports that her friend was  saying things making her think it was true  that he was cheating.  She stated what she thinks to do is her  relationship with her husband.  They had  been together for 10 years, but  have known each other for 20 years.  Husband stated he has been heard so  badly by her accusations that he is having a breakdown.  Husband  stated that the patient uses to excuse that her job is too stressful,  however, he believes this a fair business has been on her mind  constantly and did her entire life a stressful as a result.  The patient  states she wants to work on her marriage and learn to trust her husband  to find hoping strength in herself not things out in her mind.  She was  to build self-esteem etc.  Husband stated he has forgiven his wife who  just fears this will happen again with the accusations.  On July 23, 2008, the patient's blood pressure medications including quinapril and  Toprol had to be held due to hypotension.  Her sleep was good.  Appetite  was good.  She was getting less depressed and less anxious.  She felt  the family session with her husband went good.  She stated she felt  her husband was being supportive, but also feels like he has been having  affair.  On July 25, 2008, sleep was good with the Ambien.  Mood was  less depressed.  She does find herself worrying about her job as usual.  She wants to go home soon.  It was anticipated that she would be  discharged the following day.  On July 26, 2008, mental status had  improved markedly from admission status.  Mood was less depressed, less  anxious.  Affect was consistent with mood.  There was no suicidal or  homicidal ideation.  No auditory or visual hallucinations.  No paranoia  or delusions.  Thoughts were logical and goal directed.  Thought  content, no predominant theme.  Cognitive was grossly intact.  Insight  good.  Judgment good.  Impulse control good.  Her husband had visited  the night before and they  decided to do family counseling.  She is  hopeful about.  She wants her pastor to do this and feels that he will.  It was felt the patient was safe to be discharged today.  The patient's  Abilify was stopped due to concern that this was causing her to be  hypotensive.   DISCHARGE DIAGNOSES:  Axis I:  Major depression, recurrent, severe  without psychosis.  Axis II: None.  Axis III:  Gastroesophageal reflux disease, hypertension.  Axis IV:  Severe (issues with marital discord and significant  occupational stress with change in shift, burden of psychiatric  illness).  Axis V:  Global assessment of functioning was 55 upon discharge, GAF was  46 upon admission, GAF highest past year was 65.   DISCHARGE PLANS:  There was no specific activity level or dietary  instructions.   POST HOSPITAL CARE PLANS:  The patient will go to the [facility]  Intensive Outpatient Program on November 10 at 8:45 p.m.   DISCHARGE MEDICATIONS:  1. Protonix 40 mg daily.  2. Lexapro 10 mg daily.  3. Ambien 5-10 mg at bedtime as needed for sleep.  4. Abilify was discontinued due to concern that this was causing      hypotension for her.   She is also to see her primary care physician to determine when and if  to restart lisinopril and metoprolol given her recent low blood  pressures.      Jasmine Pang,  M.D.  Electronically Signed     BHS/MEDQ  D:  07/26/2008  T:  07/27/2008  Job:  [id number]

## 2011-01-31 NOTE — Assessment & Plan Note (Signed)
[facility]                         GASTROENTEROLOGY OFFICE NOTE   NAME:DAVISDorthy, Magnussen                      MRN:          [id number]  DATE:10/08/2007                            DOB:          [birth year]    PROBLEM:  Dysphagia.   HISTORY OF PRESENT ILLNESS:  Mrs. Earlene Plater has returned for scheduled  followup.  Upper endoscopy demonstrated a distal esophageal stricture  for which she was dilated to 18 mm.  Since that time she has had no  further dysphagia.  Benign fundic glandular polyps were seen in the  stomach.  She remains on Zegerid and feels well.   PHYSICAL EXAMINATION:  Pulse;  68.  Blood pressure:  130/82.  Weight:  173.   IMPRESSION:  1. Esophageal stricture - asymptomatic following dilatation therapy.  2. Gastroesophageal reflux disease well controlled with Zegerid.  3. History of colon polyps.   RECOMMENDATIONS:  1. Continue Zegerid and repeat dilatation as needed.  2. Followup colonoscopy.     Barbette Hair. Arlyce Dice, MD,FACG  Electronically Signed    RDK/MedQ  DD: 10/08/2007  DT: 10/08/2007  Job #: [id number]   cc:   Lelon Perla, DO

## 2011-01-31 NOTE — Assessment & Plan Note (Signed)
[facility]                         GASTROENTEROLOGY OFFICE NOTE   NAME:DAVISRaelyn, Wendy Christensen                      MRN:          [id number]  DATE:08/27/2007                            DOB:          1945/11/23    REASON FOR CONSULTATION:  Regurgitation and pyrosis.   HISTORY:  Ms. Earlene Plater is a pleasant 65 year old African-American female  referred through the courtesy of Dr. Laury Axon for evaluation. For years, he  has had intermittent pyrosis. Symptoms have clearly worsened over the  last six months. She is now experiencing frequent regurgitation of  gastric contents with chest burning. She has occasional dysphagia to  solids. This is despite therapy with omeprazole and Nexium. She usually  takes her omeprazole in the evenings prior to bedtime. She is on no  gastric irritants including nonsteroidals. She also has a history of  colon polyps and was last examined in 2002.   PAST MEDICAL HISTORY:  Pertinent for hypertension and high cholesterol.   FAMILY HISTORY:  Noncontributory.   MEDICATIONS:  1. Pravastatin.  2. Metoprolol.  3. Omeprazole.  4. Quinapril.  5. Multivitamins.   She has no allergies.   She never smokes nor drinks. She is married and works as a Biochemist, clinical.   REVIEW OF SYSTEMS:  Positive for some sleeping problems and loss of  hearing.   PHYSICAL EXAMINATION:  VITAL SIGNS:  On exam, pulse 50, blood pressure  162/90, weight 172.  HEENT:  EOMI.  PERRLA.  Sclerae are anicteric.  Conjunctivae are pink.  NECK:  Supple without thyromegaly, adenopathy or carotid bruits.  CHEST:  Clear to auscultation and percussion without adventitious  sounds.  CARDIAC:  Regular rhythm; normal S1 S2.  There are no murmurs, gallops  or rubs.  ABDOMEN:  Bowel sounds are normoactive.  Abdomen is soft, nontender and  nondistended.  There are no abdominal masses, tenderness, splenic  enlargement or hepatomegaly.  EXTREMITIES:  Full range of motion.  No  cyanosis, clubbing or edema.  RECTAL:  Deferred.   IMPRESSION:  1. Gastroesophageal reflux disease despite therapy with Nexium and      omeprazole.  2. Intermittent dysphagia - rule out early esophageal stricture.  3. History of colon polyps.   RECOMMENDATION:  1. The patient will consider enrolling in a GERD study. Failing this,      I would place her on Zegerid 40 mg every night.  2. Upper endoscopy with dilatation if indicated.  3. Follow up colonoscopy.     Barbette Hair. Arlyce Dice, MD,FACG  Electronically Signed    RDK/MedQ  DD: 08/27/2007  DT: 08/28/2007  Job #: [id number]   cc:   Lelon Perla, DO

## 2011-02-03 NOTE — Procedures (Signed)
[facility]  Patient:    Wendy Christensen               MRN: [id number] Proc. Date: 04/15/01 Attending:  Sabino Gasser, M.D.                           Procedure Report  PROCEDURE:  Colonoscopy.  INDICATIONS:  Colon cancer screening.  ANESTHESIA:  Demerol 20 mg, Versed 2 mg.  DESCRIPTION OF PROCEDURE:  With the patient mildly sedated in the left lateral decubitus position, the Olympus videoscopic colonoscope was inserted into the rectum and passed under direct vision.  With pressure applied to the abdomen and patient turned to her back, we reached the cecum, identified by the ileocecal valve and appendiceal orifice, both of which were photographed. From this point, the colonoscope was slowly withdrawn taking circumferential views of the entire colonic mucosa stopping only then in the rectum which appeared normal under direct view and showed internal hemorrhoids in retroflexed view.  The endoscope was straightened and withdrawn.  The patients vital signs and pulse oximeter remained stable.  The patient tolerated the procedure well without apparent complications.  FINDINGS:  Essentially negative colonoscopic examination to cecum.  PLAN:  See endoscopy note for further details. DD:  04/15/01 TD:  04/15/01 Job: [id number]

## 2011-02-03 NOTE — Procedures (Signed)
[facility]  Patient:    Wendy Christensen, Wendy Christensen                     MRN: [id number] Proc. Date: 04/15/01 Attending:  Sabino Gasser, M.D.                           Procedure Report  PROCEDURE:  Upper endoscopy.  INDICATION FOR PROCEDURE:  Hemoccult positivity.  ANESTHESIA:  Demerol 50, Versed 6 mg.  DESCRIPTION OF PROCEDURE:  With the patient mildly sedated in the left lateral decubitus position, the Olympus video endoscope was inserted in the mouth and passed under direct vision through the esophagus which appeared normal. There was no evidence of Barretts. Into the stomach, the fundus, body, antrum, duodenal bulb and second of the duodenum were visualized and photographs taken. From this point, the endoscope was slowly withdrawn taking circumferential views of the entire duodenal mucosa until the endoscope was then pulled back into the stomach, placed in retroflexion to view the stomach from below. The endoscope was then straightened and withdrawn taking circumferential views of the entire gastric mucosa stopping to photograph and remove polyps seen in the fundus of the stomach. Once accomplished, the endoscope was withdrawn taking circumferential views of the remaining gastric and esophageal mucosa which otherwise appeared normal. The patients vital signs and pulse oximeter remained stable. The patient tolerated the procedure well and without apparent complications.  FINDINGS:  Polyps of fundus, await biopsy report. The patient will call me for results and followup with me as an outpatient. Proceed to colonoscopy as planned. DD:  04/15/01 TD:  04/15/01 Job: [id number]

## 2011-02-06 ENCOUNTER — Other Ambulatory Visit: Payer: Self-pay | Admitting: Family Medicine

## 2011-04-10 ENCOUNTER — Other Ambulatory Visit: Payer: Self-pay | Admitting: Obstetrics and Gynecology

## 2011-04-10 DIAGNOSIS — Z1231 Encounter for screening mammogram for malignant neoplasm of breast: Secondary | ICD-10-CM

## 2011-04-15 ENCOUNTER — Other Ambulatory Visit: Payer: Self-pay | Admitting: Family Medicine

## 2011-04-19 ENCOUNTER — Ambulatory Visit
Admission: RE | Admit: 2011-04-19 | Discharge: 2011-04-19 | Disposition: A | Discharge status: Home / Self Care | Payer: BC Managed Care – PPO | Source: Ambulatory Visit | Attending: Obstetrics and Gynecology | Admitting: Obstetrics and Gynecology

## 2011-04-19 DIAGNOSIS — Z1231 Encounter for screening mammogram for malignant neoplasm of breast: Secondary | ICD-10-CM

## 2011-04-25 ENCOUNTER — Other Ambulatory Visit: Payer: Self-pay | Admitting: Obstetrics and Gynecology

## 2011-04-25 DIAGNOSIS — R928 Other abnormal and inconclusive findings on diagnostic imaging of breast: Secondary | ICD-10-CM

## 2011-04-28 ENCOUNTER — Other Ambulatory Visit: Payer: Self-pay | Admitting: Family Medicine

## 2011-05-02 ENCOUNTER — Ambulatory Visit
Admission: RE | Admit: 2011-05-02 | Discharge: 2011-05-02 | Disposition: A | Discharge status: Home / Self Care | Payer: BC Managed Care – PPO | Source: Ambulatory Visit | Attending: Obstetrics and Gynecology | Admitting: Obstetrics and Gynecology

## 2011-05-02 ENCOUNTER — Other Ambulatory Visit: Payer: Self-pay | Admitting: Obstetrics and Gynecology

## 2011-05-02 DIAGNOSIS — R928 Other abnormal and inconclusive findings on diagnostic imaging of breast: Secondary | ICD-10-CM

## 2011-05-17 ENCOUNTER — Other Ambulatory Visit: Payer: Self-pay | Admitting: Family Medicine

## 2011-05-31 ENCOUNTER — Ambulatory Visit (INDEPENDENT_AMBULATORY_CARE_PROVIDER_SITE_OTHER): Payer: Medicare Other | Admitting: Internal Medicine

## 2011-05-31 ENCOUNTER — Encounter: Payer: Self-pay | Admitting: Internal Medicine

## 2011-05-31 VITALS — BP 120/78 | HR 64 | Temp 98.8°F | Wt 173.0 lb

## 2011-05-31 DIAGNOSIS — R300 Dysuria: Secondary | ICD-10-CM

## 2011-05-31 DIAGNOSIS — N390 Urinary tract infection, site not specified: Secondary | ICD-10-CM

## 2011-05-31 DIAGNOSIS — R319 Hematuria, unspecified: Secondary | ICD-10-CM

## 2011-05-31 LAB — POCT URINALYSIS DIPSTICK
Bilirubin, UA: NEGATIVE
Glucose, UA: NEGATIVE
Spec Grav, UA: 1.025
Urobilinogen, UA: 0.2

## 2011-05-31 MED ORDER — NITROFURANTOIN MONOHYD MACRO 100 MG PO CAPS: 100.0000 mg | ORAL_CAPSULE | Freq: Two times a day (BID) | ORAL | Status: AC

## 2011-05-31 MED ORDER — PHENAZOPYRIDINE HCL 200 MG PO TABS: 200.0000 mg | ORAL_TABLET | Freq: Three times a day (TID) | ORAL | Status: DC | PRN

## 2011-05-31 NOTE — Patient Instructions (Signed)
Drink as much water as you can; avoid spicy foods or alcohol.

## 2011-05-31 NOTE — Progress Notes (Signed)
  Subjective:    Patient ID: Wendy Christensen, female    DOB: 27-Nov-1945, 65 y.o.   MRN: [id number]  HPI DYSURIA: Onset: 9/11    Worsening: yes with hematuria & incontinence Symptoms Urgency: yes  Frequency: yes  Hesitancy: no   Flank Pain: yes,   Fever: no Nausea/Vomiting: no  Discharge: no Red Flags  : (Risk Factors for Complicated UTI) Recent Antibiotic Usage (last 30 days): no  Rx: AZO More than 3 UTI's last 12 months: no  PMH of  1. DM: no 2. Renal Disease/Calculi: no    Review of Systems     Objective:   Physical Exam she is in no acute distress but is uncomfortable  Abdomen is nontender to palpation; she has no organomegaly or masses  She has no lymphadenopathy the head, neck or axilla.  She is able to lie back on the exam table and sit up without help. Straight leg raising is negative bilaterally.  Deep tendon reflexes and strength are normal.  Skin is warm and dry        Assessment & Plan:  #1 UTI  with large amount of blood on urinalysis; culture pending  Plan see orders

## 2011-06-03 LAB — URINE CULTURE: Colony Count: >=100000

## 2011-06-12 ENCOUNTER — Encounter: Payer: Self-pay | Admitting: Family Medicine

## 2011-06-12 ENCOUNTER — Ambulatory Visit (INDEPENDENT_AMBULATORY_CARE_PROVIDER_SITE_OTHER): Payer: Medicare Other | Admitting: Family Medicine

## 2011-06-12 VITALS — BP 142/90 | HR 73 | Temp 98.0°F | Wt 174.8 lb

## 2011-06-12 DIAGNOSIS — I10 Essential (primary) hypertension: Secondary | ICD-10-CM

## 2011-06-12 DIAGNOSIS — E785 Hyperlipidemia, unspecified: Secondary | ICD-10-CM

## 2011-06-12 DIAGNOSIS — N390 Urinary tract infection, site not specified: Secondary | ICD-10-CM

## 2011-06-12 DIAGNOSIS — R350 Frequency of micturition: Secondary | ICD-10-CM

## 2011-06-12 LAB — POCT URINALYSIS DIPSTICK
Blood, UA: NEGATIVE
Spec Grav, UA: 1.02
Urobilinogen, UA: 1

## 2011-06-12 MED ORDER — CIPROFLOXACIN HCL 500 MG PO TABS: 500.0000 mg | ORAL_TABLET | Freq: Two times a day (BID) | ORAL | Status: AC

## 2011-06-12 NOTE — Progress Notes (Signed)
  Subjective:    Wendy Christensen is a 65 y.o. female who complains of dysuria, frequency, pain suprapubic and urgency. She has had symptoms for 7 days. Patient also complains of none. Patient denies back pain, congestion, cough, fever, headache, rhinitis, sorethroat, stomach ache and vaginal discharge. Patient does not have a history of recurrent UTI. Patient does not have a history of pyelonephritis.   The following portions of the patient's history were reviewed and updated as appropriate: allergies, current medications, past family history, past medical history, past social history, past surgical history and problem list.  Review of Systems Pertinent items are noted in HPI.    Objective:    BP 142/90  Pulse 73  Temp(Src) 98 F (36.7 C) (Oral)  Wt 174 lb 12.8 oz (79.289 kg)  SpO2 97% General appearance: alert, cooperative, appears stated age and no distress Abdomen: soft, non-tender; bowel sounds normal; no masses,  no organomegaly  Laboratory:  Urine dipstick: trace for leukocyte esterase, trace for protein and trace for urobilinogen.   Micro exam: not done.    Assessment:    Acute cystitis and UTI     Plan:    Medications: ciprofloxacin. Maintain adequate hydration. Follow up if symptoms not improving, and as needed.

## 2011-06-12 NOTE — Patient Instructions (Signed)
Urinary Tract Infection (UTI)   Infections of the urinary tract can start in several places. A bladder infection (cystitis), a kidney infection (pyelonephritis), and a prostate infection (prostatitis) are different types of urinary tract infections. They usually get better if treated with medicines (antibiotics) that kill germs. Take all the medicine until it is gone. You or your child may feel better in a few days, but TAKE ALL MEDICINE or the infection may not respond and may become more difficult to treat.   HOME CARE INSTRUCTIONS   Drink enough water and fluids to keep the urine clear or pale yellow. Cranberry juice is especially recommended, in addition to large amounts of water.   Avoid caffeine, tea, and carbonated beverages. They tend to irritate the bladder.   Alcohol may irritate the prostate.   Only take over-the-counter or prescription medicines for pain, discomfort, or fever as directed by your caregiver.   FINDING OUT THE RESULTS OF YOUR TEST   Not all test results are available during your visit. If your or your child's test results are not back during the visit, make an appointment with your caregiver to find out the results. Do not assume everything is normal if you have not heard from your caregiver or the medical facility. It is important for you to follow up on all test results.   TO PREVENT FURTHER INFECTIONS:   Empty the bladder often. Avoid holding urine for long periods of time.   After a bowel movement, women should cleanse from front to back. Use each tissue only once.   Empty the bladder before and after sexual intercourse.   SEEK MEDICAL CARE IF:   There is back pain.   You or your child has an oral temperature above 100.4.   Your baby is older than 3 months with a rectal temperature of 100.5º F (38.1° C) or higher for more than 1 day.   Your or your child's problems (symptoms) are no better in 3 days. Return sooner if you or your child is getting worse.   SEEK IMMEDIATE MEDICAL CARE IF:    There is severe back pain or lower abdominal pain.   You or your child develops chills.   You or your child has an oral temperature above 100.4, not controlled by medicine.   Your baby is older than 3 months with a rectal temperature of 102º F (38.9º C) or higher.   Your baby is 3 months old or younger with a rectal temperature of 100.4º F (38º C) or higher.   There is nausea or vomiting.   There is continued burning or discomfort with urination.   MAKE SURE YOU:   Understand these instructions.   Will watch this condition.   Will get help right away if you or your child is not doing well or gets worse.   Document Released: 06/14/2005 Document Re-Released: 11/29/2009   ExitCare® Patient Information ©2011 [employer].

## 2011-06-13 LAB — CBC WITH DIFFERENTIAL/PLATELET
Eosinophils Absolute: 0.2 10*3/uL (ref 0.0–0.7)
MCHC: 32 g/dL (ref 30.0–36.0)
MCV: 86.2 fl (ref 78.0–100.0)
Monocytes Absolute: 0.4 10*3/uL (ref 0.1–1.0)
Neutrophils Relative %: 52.1 % (ref 43.0–77.0)
Platelets: 262 10*3/uL (ref 150.0–400.0)
RDW: 13.5 % (ref 11.5–14.6)

## 2011-06-13 LAB — LIPID PANEL
HDL: 51.5 mg/dL (ref >39.00)
LDL Cholesterol: 112 mg/dL — ABNORMAL HIGH (ref 0–99)
Total CHOL/HDL Ratio: 4
Triglycerides: 86 mg/dL (ref 0.0–149.0)

## 2011-06-13 LAB — HEPATIC FUNCTION PANEL
Bilirubin, Direct: 0.1 mg/dL (ref 0.0–0.3)
Total Bilirubin: 0.6 mg/dL (ref 0.3–1.2)
Total Protein: 7.9 g/dL (ref 6.0–8.3)

## 2011-06-13 LAB — BASIC METABOLIC PANEL
BUN: 18 mg/dL (ref 6–23)
CO2: 30 mEq/L (ref 19–32)
Chloride: 102 mEq/L (ref 96–112)
Creatinine, Ser: 1.1 mg/dL (ref 0.4–1.2)

## 2011-06-14 LAB — URINE CULTURE: Colony Count: NO GROWTH

## 2011-06-21 ENCOUNTER — Other Ambulatory Visit: Payer: Self-pay | Admitting: Family Medicine

## 2011-06-21 DIAGNOSIS — D649 Anemia, unspecified: Secondary | ICD-10-CM

## 2011-06-21 DIAGNOSIS — N390 Urinary tract infection, site not specified: Secondary | ICD-10-CM

## 2011-06-21 LAB — POCT TOXICOLOGY PANEL

## 2011-06-21 LAB — DIFFERENTIAL
Basophils Absolute: 0
Basophils Relative: 0
Eosinophils Absolute: 0.3
Eosinophils Relative: 4
Lymphocytes Relative: 18

## 2011-06-21 LAB — URINALYSIS, ROUTINE W REFLEX MICROSCOPIC
Nitrite: NEGATIVE
Specific Gravity, Urine: 1.009
Urobilinogen, UA: 0.2
pH: 6.5

## 2011-06-21 LAB — URINE MICROSCOPIC-ADD ON

## 2011-06-21 LAB — BASIC METABOLIC PANEL
BUN: 15
GFR calc non Af Amer: 51 — ABNORMAL LOW
Glucose, Bld: 100 — ABNORMAL HIGH
Potassium: 3.3 — ABNORMAL LOW

## 2011-06-21 LAB — CBC
HCT: 39.3
MCHC: 32.4
Platelets: 334
RDW: 12.9

## 2011-06-22 ENCOUNTER — Other Ambulatory Visit: Payer: Medicare Other

## 2011-06-23 ENCOUNTER — Other Ambulatory Visit: Payer: Self-pay | Admitting: Internal Medicine

## 2011-06-23 DIAGNOSIS — D649 Anemia, unspecified: Secondary | ICD-10-CM

## 2011-06-26 ENCOUNTER — Other Ambulatory Visit (INDEPENDENT_AMBULATORY_CARE_PROVIDER_SITE_OTHER): Payer: Medicare Other

## 2011-06-26 DIAGNOSIS — D649 Anemia, unspecified: Secondary | ICD-10-CM

## 2011-06-26 DIAGNOSIS — N390 Urinary tract infection, site not specified: Secondary | ICD-10-CM

## 2011-06-26 LAB — CBC WITH DIFFERENTIAL/PLATELET
Basophils Absolute: 0 10*3/uL (ref 0.0–0.1)
Basophils Relative: 0.5 % (ref 0.0–3.0)
HCT: 37 % (ref 36.0–46.0)
Hemoglobin: 12.1 g/dL (ref 12.0–15.0)
Lymphocytes Relative: 30.8 % (ref 12.0–46.0)
Lymphs Abs: 1.7 10*3/uL (ref 0.7–4.0)
MCHC: 32.6 g/dL (ref 30.0–36.0)
Monocytes Relative: 5.6 % (ref 3.0–12.0)
Neutro Abs: 3.1 10*3/uL (ref 1.4–7.7)
RBC: 4.32 Mil/uL (ref 3.87–5.11)
RDW: 13.6 % (ref 11.5–14.6)

## 2011-06-26 LAB — POCT URINALYSIS DIPSTICK
Blood, UA: NEGATIVE
Spec Grav, UA: 1.01
Urobilinogen, UA: 1

## 2011-06-26 LAB — IBC PANEL: Saturation Ratios: 19.9 % — ABNORMAL LOW (ref 20.0–50.0)

## 2011-06-26 NOTE — Progress Notes (Signed)
Labs only

## 2011-06-28 LAB — URINE CULTURE: Organism ID, Bacteria: NO GROWTH

## 2011-07-23 ENCOUNTER — Other Ambulatory Visit: Payer: Self-pay | Admitting: Family Medicine

## 2011-07-26 ENCOUNTER — Other Ambulatory Visit: Payer: Self-pay | Admitting: Family Medicine

## 2011-09-17 ENCOUNTER — Other Ambulatory Visit: Payer: Self-pay | Admitting: Family Medicine

## 2011-10-17 ENCOUNTER — Ambulatory Visit: Payer: Medicare Other | Admitting: Family Medicine

## 2011-10-18 ENCOUNTER — Ambulatory Visit (INDEPENDENT_AMBULATORY_CARE_PROVIDER_SITE_OTHER): Payer: Medicare Other | Admitting: Family Medicine

## 2011-10-18 ENCOUNTER — Encounter: Payer: Self-pay | Admitting: Family Medicine

## 2011-10-18 VITALS — BP 134/82 | HR 68 | Temp 98.7°F | Ht 65.5 in | Wt 183.0 lb

## 2011-10-18 DIAGNOSIS — E785 Hyperlipidemia, unspecified: Secondary | ICD-10-CM

## 2011-10-18 DIAGNOSIS — E042 Nontoxic multinodular goiter: Secondary | ICD-10-CM

## 2011-10-18 DIAGNOSIS — D649 Anemia, unspecified: Secondary | ICD-10-CM

## 2011-10-18 DIAGNOSIS — Z0000 Encounter for general adult medical examination without abnormal findings: Secondary | ICD-10-CM

## 2011-10-18 DIAGNOSIS — I10 Essential (primary) hypertension: Secondary | ICD-10-CM

## 2011-10-18 LAB — HEPATIC FUNCTION PANEL
ALT: 12 U/L (ref 0–35)
Total Bilirubin: 0.3 mg/dL (ref 0.3–1.2)

## 2011-10-18 LAB — CBC WITH DIFFERENTIAL/PLATELET
Eosinophils Absolute: 0.5 10*3/uL (ref 0.0–0.7)
Eosinophils Relative: 8.6 % — ABNORMAL HIGH (ref 0.0–5.0)
HCT: 36.7 % (ref 36.0–46.0)
Lymphs Abs: 1.6 10*3/uL (ref 0.7–4.0)
MCHC: 33.1 g/dL (ref 30.0–36.0)
MCV: 84.6 fl (ref 78.0–100.0)
Monocytes Absolute: 0.4 10*3/uL (ref 0.1–1.0)
Platelets: 269 10*3/uL (ref 150.0–400.0)
RDW: 13.8 % (ref 11.5–14.6)
WBC: 5.3 10*3/uL (ref 4.5–10.5)

## 2011-10-18 LAB — BASIC METABOLIC PANEL
BUN: 13 mg/dL (ref 6–23)
CO2: 30 mEq/L (ref 19–32)
Chloride: 101 mEq/L (ref 96–112)
Creatinine, Ser: 1.1 mg/dL (ref 0.4–1.2)
Glucose, Bld: 90 mg/dL (ref 70–99)
Potassium: 3.6 mEq/L (ref 3.5–5.1)

## 2011-10-18 LAB — LIPID PANEL
Cholesterol: 302 mg/dL — ABNORMAL HIGH (ref 0–200)
HDL: 41.8 mg/dL (ref >39.00)
Total CHOL/HDL Ratio: 7
Triglycerides: 162 mg/dL — ABNORMAL HIGH (ref 0.0–149.0)

## 2011-10-18 MED ORDER — METOPROLOL TARTRATE 100 MG PO TABS: 100.0000 mg | ORAL_TABLET | Freq: Two times a day (BID) | ORAL | Status: DC

## 2011-10-18 NOTE — Progress Notes (Signed)
  Subjective:    Patient ID: Wendy Christensen, female    DOB: 01-21-1946, 66 y.o.   MRN: [id number]  HPI Pt here stating she feels like her neck is bigger and she is worried about her thyroid nodules.  No trouble swalling and no other complaints.   Pt also wants to change toprol to something cheaper.   Review of Systems As above    Objective:   Physical Exam  Constitutional: She is oriented to person, place, and time. She appears well-developed and well-nourished. No distress.  Cardiovascular: Normal rate, regular rhythm and normal heart sounds.   No murmur heard. Pulmonary/Chest: Effort normal and breath sounds normal. No respiratory distress. She has no wheezes. She has no rales.  Musculoskeletal: She exhibits no edema and no tenderness.  Neurological: She is alert and oriented to person, place, and time.  Skin: She is not diaphoretic.  Psychiatric: She has a normal mood and affect. Her behavior is normal. Judgment and thought content normal.  neck--  + goiter        Assessment & Plan:  Hx thyroid nodule---  Check US thyroid htn--change toprol to lopressor 100 mg bid Hyperlipidemia-- recheck labs Anemia-- check labs

## 2011-10-18 NOTE — Patient Instructions (Signed)
Thyroid Diseases Your thyroid is a butterfly-shaped gland in your neck. It is located just above your collarbone. It is one of your endocrine glands, which make hormones. The thyroid helps set your metabolism. Metabolism is how your body gets energy from the foods you eat.  Millions of people have thyroid diseases. Women experience thyroid problems more often than men. In fact, overactive thyroid problems (hyperthyroidism) occur in 1% of all women. If you have a thyroid disease, your body may use energy more slowly or quickly than it should.  Thyroid problems also include an immune disease where your body reacts against your thyroid gland (called thyroiditis). A different problem involves lumps and bumps (called nodules) that develop in the gland. The nodules are usually, but not always, noncancerous. THE MOST COMMON THYROID PROBLEMS AND CAUSES ARE DISCUSSED BELOW There are many causes for thyroid problems. Treatment depends upon the exact diagnosis and includes trying to reset your body's metabolism to a normal rate. Hyperthyroidism Too much thyroid hormone from an overactive thyroid gland is called hyperthyroidism. In hyperthyroidism, the body's metabolism speeds up. One of the most frequent forms of hyperthyroidism is known as Graves' disease. Graves' disease tends to run in families. Although Graves' is thought to be caused by a problem with the immune system, the exact nature of the genetic problem is unknown. Hypothyroidism Too little thyroid hormone from an underactive thyroid gland is called hypothyroidism. In hypothyroidism, the body's metabolism is slowed. Several things can cause this condition. Most causes affect the thyroid gland directly and hurt its ability to make enough hormone.  Rarely, there may be a pituitary gland tumor (located near the base of the brain). The tumor can block the pituitary from producing thyroid-stimulating hormone (TSH). Your body makes TSH to stimulate the thyroid  to work properly. If the pituitary does not make enough TSH, the thyroid fails to make enough hormones needed for good health. Whether the problem is caused by thyroid conditions or by the pituitary gland, the result is that the thyroid is not making enough hormones. Hypothyroidism causes many physical and mental processes to become sluggish. The body consumes less oxygen and produces less body heat. Thyroid Nodules A thyroid nodule is a small swelling or lump in the thyroid gland. They are common. These nodules represent either a growth of thyroid tissue or a fluid-filled cyst. Both form a lump in the thyroid gland. Almost half of all people will have tiny thyroid nodules at some point in their lives. Typically, these are not noticeable until they become large and affect normal thyroid size. Larger nodules that are greater than a half inch across (about 1 centimeter) occur in about 5 percent of people. Although most nodules are not cancerous, people who have them should seek medical care to rule out cancer. Also, some thyroid nodules may produce too much thyroid hormone or become too large. Large nodules or a large gland can interfere with breathing or swallowing or may cause neck discomfort. Other problems Other thyroid problems include cancer and thyroiditis. Thyroiditis is a malfunction of the body's immune system. Normally, the immune system works to defend the body against infection and other problems. When the immune system is not working properly, it may mistakenly attack normal cells, tissues, and organs. Examples of autoimmune diseases are Hashimoto's thyroiditis (which causes low thyroid function) and Graves' disease (which causes excess thyroid function). SYMPTOMS  Symptoms vary greatly depending upon the exact type of problem with the thyroid. Hyperthyroidism-is when your thyroid is too   active and makes more thyroid hormone than your body needs. The most common cause is Graves' Disease. Too  much thyroid hormone can cause some or all of the following symptoms:  Anxiety.   Irritability.   Difficulty sleeping.   Fatigue.   A rapid or irregular heartbeat.   A fine tremor of your hands or fingers.   An increase in perspiration.   Sensitivity to heat.   Weight loss, despite normal food intake.   Brittle hair.   Enlargement of your thyroid gland (goiter).   Light menstrual periods.   Frequent bowel movements.  Graves' disease can specifically cause eye and skin problems. The skin problems involve reddening and swelling of the skin, often on your shins and on the top of your feet. Eye problems can include the following:  Excess tearing and sensation of grit or sand in either or both eyes.   Reddened or inflamed eyes.   Widening of the space between your eyelids.   Swelling of the lids and tissues around the eyes.   Light sensitivity.   Ulcers on the cornea.   Double vision.   Limited eye movements.   Blurred or reduced vision.  Hypothyroidism- is when your thyroid gland is not active enough. This is more common than hyperthyroidism. Symptoms can vary a lot depending of the severity of the hormone deficiency. Symptoms may develop over a long period of time and can include several of the following:  Fatigue.   Sluggishness.   Increased sensitivity to cold.   Constipation.   Pale, dry skin.   A puffy face.   Hoarse voice.   High blood cholesterol level.   Unexplained weight gain.   Muscle aches, tenderness and stiffness.   Pain, stiffness or swelling in your joints.   Muscle weakness.   Heavier than normal menstrual periods.   Brittle fingernails and hair.   Depression.  Thyroid Nodules - most do not cause signs or symptoms. Occasionally, some may become so large that you can feel or even see the swelling at the base of your neck. You may realize a lump or swelling is there when you are shaving or putting on makeup. Men might become  aware of a nodule when shirt collars suddenly feel too tight. Some nodules produce too much thyroid hormone. This can produce the same symptoms as hyperthyroidism (see above). Thyroid nodules are seldom cancerous. However, a nodule is more likely to be malignant (cancerous) if it:  Grows quickly or feels hard.   Causes you to become hoarse or to have trouble swallowing or breathing.   Causes enlarged lymph nodes under your jaw or in your neck.  DIAGNOSIS  Because there are so many possible thyroid conditions, your caregiver may ask for a number of tests. They will do this in order to narrow down the exact diagnosis. These tests can include:  Blood and antibody tests.   Special thyroid scans using small, safe amounts of radioactive iodine.   Ultrasound of the thyroid gland (particularly if there is a nodule or lump).   Biopsy. This is usually done with a special needle. A needle biopsy is a procedure to obtain a sample of cells from the thyroid. The tissue will be tested in a lab and examined under a microscope.  TREATMENT  Treatment depends on the exact diagnosis. Hyperthyroidism  Beta-blockers help relieve many of the symptoms.   Anti-thyroid medications prevent the thyroid from making excess hormones.   Radioactive iodine treatment can destroy overactive thyroid   cells. The iodine can permanently decrease the amount of hormone produced.   Surgery to remove the thyroid gland.   Treatments for eye problems that come from Graves' disease also include medications and special eye surgery, if felt to be appropriate.  Hypothyroidism Thyroid replacement with levothyroxine is the mainstay of treatment. Treatment with thyroid replacement is usually lifelong and will require monitoring and adjustment from time to time. Thyroid Nodules  Watchful waiting. If a small nodule causes no symptoms or signs of cancer on biopsy, then no treatment may be chosen at first. Re-exam and re-checking blood  tests would be the recommended follow-up.   Anti-thyroid medications or radioactive iodine treatment may be recommended if the nodules produce too much thyroid hormone (see Treatment for Hyperthyroidism above).   Alcohol ablation. Injections of small amounts of ethyl alcohol (ethanol) can cause a non-cancerous nodule to shrink in size.   Surgery (see Treatment for Hyperthyroidism above).  HOME CARE INSTRUCTIONS   Take medications as instructed.   Follow through on recommended testing.  SEEK MEDICAL CARE IF:   You feel that you are developing symptoms of Hyperthyroidism or Hypothyroidism as described above.   You develop a new lump/nodule in the neck/thyroid area that you had not noticed before.   You feel that you are having side effects from medicines prescribed.   You develop trouble breathing or swallowing.  SEEK IMMEDIATE MEDICAL CARE IF:   You develop a fever of 102 F (38.9 C) or higher.   You develop severe sweating.   You develop palpitations and/or rapid heart beat.   You develop shortness of breath.   You develop nausea and vomiting.   You develop extreme shakiness.   You develop agitation.   You develop lightheadedness or have a fainting episode.  Document Released: 07/02/2007 Document Revised: 05/17/2011 Document Reviewed: 07/02/2007 ExitCare Patient Information 2012 [employer]. 

## 2011-10-23 ENCOUNTER — Ambulatory Visit (HOSPITAL_BASED_OUTPATIENT_CLINIC_OR_DEPARTMENT_OTHER)
Admission: RE | Admit: 2011-10-23 | Discharge: 2011-10-23 | Disposition: A | Discharge status: Home / Self Care | Payer: Medicare Other | Source: Ambulatory Visit | Attending: Family Medicine | Admitting: Family Medicine

## 2011-10-23 DIAGNOSIS — E042 Nontoxic multinodular goiter: Secondary | ICD-10-CM

## 2011-10-31 ENCOUNTER — Ambulatory Visit (AMBULATORY_SURGERY_CENTER): Payer: Medicare Other | Admitting: *Deleted

## 2011-10-31 ENCOUNTER — Telehealth: Payer: Self-pay | Admitting: *Deleted

## 2011-10-31 VITALS — Ht 65.0 in | Wt 180.0 lb

## 2011-10-31 DIAGNOSIS — Z1211 Encounter for screening for malignant neoplasm of colon: Secondary | ICD-10-CM

## 2011-10-31 MED ORDER — PEG-KCL-NACL-NASULF-NA ASC-C 100 G PO SOLR: ORAL | Status: DC

## 2011-10-31 NOTE — Telephone Encounter (Signed)
Advised pt to stop Iron supplement 5-7 days before procedure.  Pt verbalized understanding.   Wendy Christensen

## 2011-11-01 ENCOUNTER — Encounter: Payer: Self-pay | Admitting: Gastroenterology

## 2011-11-08 ENCOUNTER — Telehealth: Payer: Self-pay | Admitting: Gastroenterology

## 2011-11-08 NOTE — Telephone Encounter (Signed)
Pt will come by office today and pick up voucher for free MoviPrep. Wendy Christensen

## 2011-11-14 ENCOUNTER — Ambulatory Visit (AMBULATORY_SURGERY_CENTER): Payer: Medicare Other | Admitting: Gastroenterology

## 2011-11-14 ENCOUNTER — Encounter: Payer: Self-pay | Admitting: Gastroenterology

## 2011-11-14 VITALS — BP 180/96 | HR 80 | Temp 96.5°F | Resp 16 | Ht 65.0 in | Wt 180.0 lb

## 2011-11-14 DIAGNOSIS — D126 Benign neoplasm of colon, unspecified: Secondary | ICD-10-CM

## 2011-11-14 DIAGNOSIS — Z1211 Encounter for screening for malignant neoplasm of colon: Secondary | ICD-10-CM

## 2011-11-14 MED ORDER — SODIUM CHLORIDE 0.9 % IV SOLN: 500.0000 mL | INTRAVENOUS | Status: DC

## 2011-11-14 NOTE — Patient Instructions (Signed)
YOU HAD AN ENDOSCOPIC PROCEDURE TODAY AT THE [facility]: Refer to the procedure report that was given to you for any specific questions about what was found during the examination.  If the procedure report does not answer your questions, please call your gastroenterologist to clarify.  If you requested that your care partner not be given the details of your procedure findings, then the procedure report has been included in a sealed envelope for you to review at your convenience later.  YOU SHOULD EXPECT: Some feelings of bloating in the abdomen. Passage of more gas than usual.  Walking can help get rid of the air that was put into your GI tract during the procedure and reduce the bloating. If you had a lower endoscopy (such as a colonoscopy or flexible sigmoidoscopy) you may notice spotting of blood in your stool or on the toilet paper. If you underwent a bowel prep for your procedure, then you may not have a normal bowel movement for a few days.  DIET: Your first meal following the procedure should be a light meal and then it is ok to progress to your normal diet.  A half-sandwich or bowl of soup is an example of a good first meal.  Heavy or fried foods are harder to digest and may make you feel nauseous or bloated.  Likewise meals heavy in dairy and vegetables can cause extra gas to form and this can also increase the bloating.  Drink plenty of fluids but you should avoid alcoholic beverages for 24 hours.  ACTIVITY: Your care partner should take you home directly after the procedure.  You should plan to take it easy, moving slowly for the rest of the day.  You can resume normal activity the day after the procedure however you should NOT DRIVE or use heavy machinery for 24 hours (because of the sedation medicines used during the test).    SYMPTOMS TO REPORT IMMEDIATELY: A gastroenterologist can be reached at any hour.  During normal business hours, 8:30 AM to 5:00 PM Monday through Friday,  call [phone].  After hours and on weekends, please call the GI answering service at [phone] who will take a message and have the physician on call contact you.   Following lower endoscopy (colonoscopy or flexible sigmoidoscopy):  Excessive amounts of blood in the stool  Significant tenderness or worsening of abdominal pains  Swelling of the abdomen that is new, acute  Fever of 100F or higher    FOLLOW UP: If any biopsies were taken you will be contacted by phone or by letter within the next 1-3 weeks.  Call your gastroenterologist if you have not heard about the biopsies in 3 weeks.  Our staff will call the home number listed on your records the next business day following your procedure to check on you and address any questions or concerns that you may have at that time regarding the information given to you following your procedure. This is a courtesy call and so if there is no answer at the home number and we have not heard from you through the emergency physician on call, we will assume that you have returned to your regular daily activities without incident.  SIGNATURES/CONFIDENTIALITY: You and/or your care partner have signed paperwork which will be entered into your electronic medical record.  These signatures attest to the fact that that the information above on your After Visit Summary has been reviewed and is understood.  Full responsibility of the confidentiality   of this discharge information lies with you and/or your care-partner.     

## 2011-11-14 NOTE — Op Note (Signed)
[facility] [address] [place], [place]  [address]  COLONOSCOPY PROCEDURE REPORT  PATIENT:  Wendy Christensen, Wendy Christensen  MR#:  [id number] BIRTHDATE:  1946-06-07, 66 yrs. old  GENDER:  female ENDOSCOPIST:  Barbette Hair. Arlyce Dice, MD REF. BY: PROCEDURE DATE:  11/14/2011 PROCEDURE:  Colonoscopy with snare polypectomy ASA CLASS:  Class II INDICATIONS:  Routine Risk Screening MEDICATIONS:   MAC sedation, administered by CRNA propofol 230mg IV  DESCRIPTION OF PROCEDURE:   After the risks benefits and alternatives of the procedure were thoroughly explained, informed consent was obtained.  Digital rectal exam was performed and revealed no abnormalities.   The LB 180AL [id number] endoscope was introduced through the anus and advanced to the cecum, which was identified by both the appendix and ileocecal valve, without limitations.  The quality of the prep was excellent, using MoviPrep.  The instrument was then slowly withdrawn as the colon was fully examined. <<PROCEDUREIMAGES>>  FINDINGS:  A sessile polyp was found at the splenic flexure. It was 3 - 4 mm in size. Polyp was snared without cautery. Retrieval was successful (see image5). snare polyp  Moderate diverticulosis was found in the descending colon (see image2).  This was otherwise a normal examination of the colon (see image1 and image6).   Retroflexed views in the rectum revealed no abnormalities.    The time to cecum =  1) 3.25  minutes. The scope was then withdrawn in  1) 10.50  minutes from the cecum and the procedure completed. COMPLICATIONS:  None ENDOSCOPIC IMPRESSION: 1) 3 - 4 mm sessile polyp at the splenic flexure 2) Moderate diverticulosis in the descending colon 3) Otherwise normal examination RECOMMENDATIONS: 1) If the polyp(s) removed today are proven to be adenomatous (pre-cancerous) polyps, you will need a repeat colonoscopy in 5 years. Otherwise you should continue to follow colorectal cancer screening  guidelines for "routine risk" patients with colonoscopy in 10 years. You will receive a letter within 1-2 weeks with the results of your biopsy as well as final recommendations. Please call my office if you have not received a letter after 3 weeks. REPEAT EXAM:   You will receive a letter from Dr. Arlyce Dice in 1-2 weeks, after reviewing the final pathology, with followup recommendations.  ______________________________ Barbette Hair Arlyce Dice, MD  CC:  Lelon Perla, DO  n. eSIGNED:   [name] at 11/14/2011 02:04 PM  [name], [phone]

## 2011-11-14 NOTE — Progress Notes (Signed)
Patient did not experience any of the following events: a burn prior to discharge; a fall within the facility; wrong site/side/patient/procedure/implant event; or a hospital transfer or hospital admission upon discharge from the facility. (G8907) Patient did not have preoperative order for IV antibiotic SSI prophylaxis. (G8918)  

## 2011-11-14 NOTE — Progress Notes (Signed)
Propofol per  [employer]. See scanned intra procedure report. ewm

## 2011-11-15 ENCOUNTER — Telehealth: Payer: Self-pay | Admitting: *Deleted

## 2011-11-15 NOTE — Telephone Encounter (Signed)
  Follow up Call-  Call back number 11/14/2011  Post procedure Call Back phone  # [phone]  Permission to leave phone message No     Patient questions:  No message left per patient.

## 2011-11-21 ENCOUNTER — Encounter: Payer: Self-pay | Admitting: Gastroenterology

## 2011-11-27 ENCOUNTER — Other Ambulatory Visit (HOSPITAL_COMMUNITY)
Admission: RE | Admit: 2011-11-27 | Discharge: 2011-11-27 | Disposition: A | Discharge status: Home / Self Care | Payer: Medicare Other | Source: Ambulatory Visit | Attending: Family Medicine | Admitting: Family Medicine

## 2011-11-27 ENCOUNTER — Ambulatory Visit (INDEPENDENT_AMBULATORY_CARE_PROVIDER_SITE_OTHER): Payer: Medicare Other | Admitting: Family Medicine

## 2011-11-27 ENCOUNTER — Encounter: Payer: Self-pay | Admitting: Family Medicine

## 2011-11-27 VITALS — BP 144/86 | HR 66 | Temp 98.6°F | Ht 66.0 in | Wt 184.2 lb

## 2011-11-27 DIAGNOSIS — I10 Essential (primary) hypertension: Secondary | ICD-10-CM

## 2011-11-27 DIAGNOSIS — E785 Hyperlipidemia, unspecified: Secondary | ICD-10-CM

## 2011-11-27 DIAGNOSIS — F411 Generalized anxiety disorder: Secondary | ICD-10-CM

## 2011-11-27 DIAGNOSIS — Z136 Encounter for screening for cardiovascular disorders: Secondary | ICD-10-CM

## 2011-11-27 DIAGNOSIS — Z01419 Encounter for gynecological examination (general) (routine) without abnormal findings: Secondary | ICD-10-CM | POA: Insufficient documentation

## 2011-11-27 DIAGNOSIS — Z124 Encounter for screening for malignant neoplasm of cervix: Secondary | ICD-10-CM

## 2011-11-27 DIAGNOSIS — Z0000 Encounter for general adult medical examination without abnormal findings: Secondary | ICD-10-CM

## 2011-11-27 DIAGNOSIS — K219 Gastro-esophageal reflux disease without esophagitis: Secondary | ICD-10-CM

## 2011-11-27 DIAGNOSIS — R319 Hematuria, unspecified: Secondary | ICD-10-CM

## 2011-11-27 LAB — CBC WITH DIFFERENTIAL/PLATELET
Basophils Absolute: 0 10*3/uL (ref 0.0–0.1)
Basophils Relative: 0.6 % (ref 0.0–3.0)
Eosinophils Absolute: 0.4 10*3/uL (ref 0.0–0.7)
Eosinophils Relative: 5.9 % — ABNORMAL HIGH (ref 0.0–5.0)
HCT: 37.6 % (ref 36.0–46.0)
Hemoglobin: 12.4 g/dL (ref 12.0–15.0)
Lymphocytes Relative: 29.3 % (ref 12.0–46.0)
Lymphs Abs: 1.8 10*3/uL (ref 0.7–4.0)
MCHC: 32.9 g/dL (ref 30.0–36.0)
MCV: 84.2 fl (ref 78.0–100.0)
Monocytes Absolute: 0.4 10*3/uL (ref 0.1–1.0)
Monocytes Relative: 6.3 % (ref 3.0–12.0)
Neutro Abs: 3.5 10*3/uL (ref 1.4–7.7)
Neutrophils Relative %: 57.9 % (ref 43.0–77.0)
Platelets: 269 10*3/uL (ref 150.0–400.0)
RBC: 4.47 Mil/uL (ref 3.87–5.11)
RDW: 13.7 % (ref 11.5–14.6)
WBC: 6.1 10*3/uL (ref 4.5–10.5)

## 2011-11-27 LAB — POCT URINALYSIS DIPSTICK
Bilirubin, UA: NEGATIVE
Glucose, UA: NEGATIVE
Nitrite, UA: NEGATIVE
Urobilinogen, UA: 0.2

## 2011-11-27 LAB — BASIC METABOLIC PANEL
BUN: 16 mg/dL (ref 6–23)
CO2: 32 mEq/L (ref 19–32)
Calcium: 9.2 mg/dL (ref 8.4–10.5)
Creatinine, Ser: 0.9 mg/dL (ref 0.4–1.2)
GFR: 77.09 mL/min (ref >60.00)
Glucose, Bld: 86 mg/dL (ref 70–99)
Sodium: 139 mEq/L (ref 135–145)

## 2011-11-27 LAB — MICROALBUMIN / CREATININE URINE RATIO
Creatinine,U: 190.8 mg/dL
Microalb Creat Ratio: 0.3 mg/g (ref 0.0–30.0)
Microalb, Ur: 0.5 mg/dL (ref 0.0–1.9)

## 2011-11-27 LAB — HEPATIC FUNCTION PANEL
ALT: 17 U/L (ref 0–35)
AST: 21 U/L (ref 0–37)
Bilirubin, Direct: 0 mg/dL (ref 0.0–0.3)
Total Protein: 7.8 g/dL (ref 6.0–8.3)

## 2011-11-27 LAB — LIPID PANEL
Cholesterol: 185 mg/dL (ref 0–200)
LDL Cholesterol: 109 mg/dL — ABNORMAL HIGH (ref 0–99)
Total CHOL/HDL Ratio: 4
Triglycerides: 131 mg/dL (ref 0.0–149.0)

## 2011-11-27 MED ORDER — METOPROLOL TARTRATE 100 MG PO TABS: 100.0000 mg | ORAL_TABLET | Freq: Two times a day (BID) | ORAL | Status: DC

## 2011-11-27 MED ORDER — PANTOPRAZOLE SODIUM 40 MG PO TBEC: 40.0000 mg | DELAYED_RELEASE_TABLET | Freq: Every day | ORAL | Status: DC

## 2011-11-27 NOTE — Patient Instructions (Signed)
Preventive Care for Adults, Female A healthy lifestyle and preventive care can promote health and wellness. Preventive health guidelines for women include the following key practices.  A routine yearly physical is a good way to check with your caregiver about your health and preventive screening. It is a chance to share any concerns and updates on your health, and to receive a thorough exam.   Visit your dentist for a routine exam and preventive care every 6 months. Brush your teeth twice a day and floss once a day. Good oral hygiene prevents tooth decay and gum disease.   The frequency of eye exams is based on your age, health, family medical history, use of contact lenses, and other factors. Follow your caregiver's recommendations for frequency of eye exams.   Eat a healthy diet. Foods like vegetables, fruits, whole grains, low-fat dairy products, and lean protein foods contain the nutrients you need without too many calories. Decrease your intake of foods high in solid fats, added sugars, and salt. Eat the right amount of calories for you.Get information about a proper diet from your caregiver, if necessary.   Regular physical exercise is one of the most important things you can do for your health. Most adults should get at least 150 minutes of moderate-intensity exercise (any activity that increases your heart rate and causes you to sweat) each week. In addition, most adults need muscle-strengthening exercises on 2 or more days a week.   Maintain a healthy weight. The body mass index (BMI) is a screening tool to identify possible weight problems. It provides an estimate of body fat based on height and weight. Your caregiver can help determine your BMI, and can help you achieve or maintain a healthy weight.For adults 20 years and older:   A BMI below 18.5 is considered underweight.   A BMI of 18.5 to 24.9 is normal.   A BMI of 25 to 29.9 is considered overweight.   A BMI of 30 and above is  considered obese.   Maintain normal blood lipids and cholesterol levels by exercising and minimizing your intake of saturated fat. Eat a balanced diet with plenty of fruit and vegetables. Blood tests for lipids and cholesterol should begin at age 20 and be repeated every 5 years. If your lipid or cholesterol levels are high, you are over 50, or you are at high risk for heart disease, you may need your cholesterol levels checked more frequently.Ongoing high lipid and cholesterol levels should be treated with medicines if diet and exercise are not effective.   If you smoke, find out from your caregiver how to quit. If you do not use tobacco, do not start.   If you are pregnant, do not drink alcohol. If you are breastfeeding, be very cautious about drinking alcohol. If you are not pregnant and choose to drink alcohol, do not exceed 1 drink per day. One drink is considered to be 12 ounces (355 mL) of beer, 5 ounces (148 mL) of wine, or 1.5 ounces (44 mL) of liquor.   Avoid use of street drugs. Do not share needles with anyone. Ask for help if you need support or instructions about stopping the use of drugs.   High blood pressure causes heart disease and increases the risk of stroke. Your blood pressure should be checked at least every 1 to 2 years. Ongoing high blood pressure should be treated with medicines if weight loss and exercise are not effective.   If you are 55 to 64   years old, ask your caregiver if you should take aspirin to prevent strokes.   Diabetes screening involves taking a blood sample to check your fasting blood sugar level. This should be done once every 3 years, after age 45, if you are within normal weight and without risk factors for diabetes. Testing should be considered at a younger age or be carried out more frequently if you are overweight and have at least 1 risk factor for diabetes.   Breast cancer screening is essential preventive care for women. You should practice "breast  self-awareness." This means understanding the normal appearance and feel of your breasts and may include breast self-examination. Any changes detected, no matter how small, should be reported to a caregiver. Women in their 20s and 30s should have a clinical breast exam (CBE) by a caregiver as part of a regular health exam every 1 to 3 years. After age 40, women should have a CBE every year. Starting at age 40, women should consider having a mammography (breast X-ray test) every year. Women who have a family history of breast cancer should talk to their caregiver about genetic screening. Women at a high risk of breast cancer should talk to their caregivers about having magnetic resonance imaging (MRI) and a mammography every year.   The Pap test is a screening test for cervical cancer. A Pap test can show cell changes on the cervix that might become cervical cancer if left untreated. A Pap test is a procedure in which cells are obtained and examined from the lower end of the uterus (cervix).   Women should have a Pap test starting at age 21.   Between ages 21 and 29, Pap tests should be repeated every 2 years.   Beginning at age 30, you should have a Pap test every 3 years as long as the past 3 Pap tests have been normal.   Some women have medical problems that increase the chance of getting cervical cancer. Talk to your caregiver about these problems. It is especially important to talk to your caregiver if a new problem develops soon after your last Pap test. In these cases, your caregiver may recommend more frequent screening and Pap tests.   The above recommendations are the same for women who have or have not gotten the vaccine for human papillomavirus (HPV).   If you had a hysterectomy for a problem that was not cancer or a condition that could lead to cancer, then you no longer need Pap tests. Even if you no longer need a Pap test, a regular exam is a good idea to make sure no other problems are  starting.   If you are between ages 65 and 70, and you have had normal Pap tests going back 10 years, you no longer need Pap tests. Even if you no longer need a Pap test, a regular exam is a good idea to make sure no other problems are starting.   If you have had past treatment for cervical cancer or a condition that could lead to cancer, you need Pap tests and screening for cancer for at least 20 years after your treatment.   If Pap tests have been discontinued, risk factors (such as a new sexual partner) need to be reassessed to determine if screening should be resumed.   The HPV test is an additional test that may be used for cervical cancer screening. The HPV test looks for the virus that can cause the cell changes on the cervix.   The cells collected during the Pap test can be tested for HPV. The HPV test could be used to screen women aged 30 years and older, and should be used in women of any age who have unclear Pap test results. After the age of 30, women should have HPV testing at the same frequency as a Pap test.   Colorectal cancer can be detected and often prevented. Most routine colorectal cancer screening begins at the age of 50 and continues through age 75. However, your caregiver may recommend screening at an earlier age if you have risk factors for colon cancer. On a yearly basis, your caregiver may provide home test kits to check for hidden blood in the stool. Use of a small camera at the end of a tube, to directly examine the colon (sigmoidoscopy or colonoscopy), can detect the earliest forms of colorectal cancer. Talk to your caregiver about this at age 50, when routine screening begins. Direct examination of the colon should be repeated every 5 to 10 years through age 75, unless early forms of pre-cancerous polyps or small growths are found.   Hepatitis C blood testing is recommended for all people born from 1945 through 1965 and any individual with known risks for hepatitis C.    Practice safe sex. Use condoms and avoid high-risk sexual practices to reduce the spread of sexually transmitted infections (STIs). STIs include gonorrhea, chlamydia, syphilis, trichomonas, herpes, HPV, and human immunodeficiency virus (HIV). Herpes, HIV, and HPV are viral illnesses that have no cure. They can result in disability, cancer, and death. Sexually active women aged 25 and younger should be checked for chlamydia. Older women with new or multiple partners should also be tested for chlamydia. Testing for other STIs is recommended if you are sexually active and at increased risk.   Osteoporosis is a disease in which the bones lose minerals and strength with aging. This can result in serious bone fractures. The risk of osteoporosis can be identified using a bone density scan. Women ages 65 and over and women at risk for fractures or osteoporosis should discuss screening with their caregivers. Ask your caregiver whether you should take a calcium supplement or vitamin D to reduce the rate of osteoporosis.   Menopause can be associated with physical symptoms and risks. Hormone replacement therapy is available to decrease symptoms and risks. You should talk to your caregiver about whether hormone replacement therapy is right for you.   Use sunscreen with sun protection factor (SPF) of 30 or more. Apply sunscreen liberally and repeatedly throughout the day. You should seek shade when your shadow is shorter than you. Protect yourself by wearing long sleeves, pants, a wide-brimmed hat, and sunglasses year round, whenever you are outdoors.   Once a month, do a whole body skin exam, using a mirror to look at the skin on your back. Notify your caregiver of new moles, moles that have irregular borders, moles that are larger than a pencil eraser, or moles that have changed in shape or color.   Stay current with required immunizations.   Influenza. You need a dose every fall (or winter). The composition of  the flu vaccine changes each year, so being vaccinated once is not enough.   Pneumococcal polysaccharide. You need 1 to 2 doses if you smoke cigarettes or if you have certain chronic medical conditions. You need 1 dose at age 65 (or older) if you have never been vaccinated.   Tetanus, diphtheria, pertussis (Tdap, Td). Get 1 dose of   Tdap vaccine if you are younger than age 65, are over 65 and have contact with an infant, are a healthcare worker, are pregnant, or simply want to be protected from whooping cough. After that, you need a Td booster dose every 10 years. Consult your caregiver if you have not had at least 3 tetanus and diphtheria-containing shots sometime in your life or have a deep or dirty wound.   HPV. You need this vaccine if you are a woman age 26 or younger. The vaccine is given in 3 doses over 6 months.   Measles, mumps, rubella (MMR). You need at least 1 dose of MMR if you were born in 1957 or later. You may also need a second dose.   Meningococcal. If you are age 19 to 21 and a first-year college student living in a residence hall, or have one of several medical conditions, you need to get vaccinated against meningococcal disease. You may also need additional booster doses.   Zoster (shingles). If you are age 60 or older, you should get this vaccine.   Varicella (chickenpox). If you have never had chickenpox or you were vaccinated but received only 1 dose, talk to your caregiver to find out if you need this vaccine.   Hepatitis A. You need this vaccine if you have a specific risk factor for hepatitis A virus infection or you simply wish to be protected from this disease. The vaccine is usually given as 2 doses, 6 to 18 months apart.   Hepatitis B. You need this vaccine if you have a specific risk factor for hepatitis B virus infection or you simply wish to be protected from this disease. The vaccine is given in 3 doses, usually over 6 months.  Preventive Services /  Frequency Ages 19 to 39  Blood pressure check.** / Every 1 to 2 years.   Lipid and cholesterol check.** / Every 5 years beginning at age 20.   Clinical breast exam.** / Every 3 years for women in their 20s and 30s.   Pap test.** / Every 2 years from ages 21 through 29. Every 3 years starting at age 30 through age 65 or 70 with a history of 3 consecutive normal Pap tests.   HPV screening.** / Every 3 years from ages 30 through ages 65 to 70 with a history of 3 consecutive normal Pap tests.   Hepatitis C blood test.** / For any individual with known risks for hepatitis C.   Skin self-exam. / Monthly.   Influenza immunization.** / Every year.   Pneumococcal polysaccharide immunization.** / 1 to 2 doses if you smoke cigarettes or if you have certain chronic medical conditions.   Tetanus, diphtheria, pertussis (Tdap, Td) immunization. / A one-time dose of Tdap vaccine. After that, you need a Td booster dose every 10 years.   HPV immunization. / 3 doses over 6 months, if you are 26 and younger.   Measles, mumps, rubella (MMR) immunization. / You need at least 1 dose of MMR if you were born in 1957 or later. You may also need a second dose.   Meningococcal immunization. / 1 dose if you are age 19 to 21 and a first-year college student living in a residence hall, or have one of several medical conditions, you need to get vaccinated against meningococcal disease. You may also need additional booster doses.   Varicella immunization.** / Consult your caregiver.   Hepatitis A immunization.** / Consult your caregiver. 2 doses, 6 to 18 months   apart.   Hepatitis B immunization.** / Consult your caregiver. 3 doses usually over 6 months.  Ages 40 to 64  Blood pressure check.** / Every 1 to 2 years.   Lipid and cholesterol check.** / Every 5 years beginning at age 20.   Clinical breast exam.** / Every year after age 40.   Mammogram.** / Every year beginning at age 40 and continuing for as  long as you are in good health. Consult with your caregiver.   Pap test.** / Every 3 years starting at age 30 through age 65 or 70 with a history of 3 consecutive normal Pap tests.   HPV screening.** / Every 3 years from ages 30 through ages 65 to 70 with a history of 3 consecutive normal Pap tests.   Fecal occult blood test (FOBT) of stool. / Every year beginning at age 50 and continuing until age 75. You may not need to do this test if you get a colonoscopy every 10 years.   Flexible sigmoidoscopy or colonoscopy.** / Every 5 years for a flexible sigmoidoscopy or every 10 years for a colonoscopy beginning at age 50 and continuing until age 75.   Hepatitis C blood test.** / For all people born from 1945 through 1965 and any individual with known risks for hepatitis C.   Skin self-exam. / Monthly.   Influenza immunization.** / Every year.   Pneumococcal polysaccharide immunization.** / 1 to 2 doses if you smoke cigarettes or if you have certain chronic medical conditions.   Tetanus, diphtheria, pertussis (Tdap, Td) immunization.** / A one-time dose of Tdap vaccine. After that, you need a Td booster dose every 10 years.   Measles, mumps, rubella (MMR) immunization. / You need at least 1 dose of MMR if you were born in 1957 or later. You may also need a second dose.   Varicella immunization.** / Consult your caregiver.   Meningococcal immunization.** / Consult your caregiver.   Hepatitis A immunization.** / Consult your caregiver. 2 doses, 6 to 18 months apart.   Hepatitis B immunization.** / Consult your caregiver. 3 doses, usually over 6 months.  Ages 65 and over  Blood pressure check.** / Every 1 to 2 years.   Lipid and cholesterol check.** / Every 5 years beginning at age 20.   Clinical breast exam.** / Every year after age 40.   Mammogram.** / Every year beginning at age 40 and continuing for as long as you are in good health. Consult with your caregiver.   Pap test.** /  Every 3 years starting at age 30 through age 65 or 70 with a 3 consecutive normal Pap tests. Testing can be stopped between 65 and 70 with 3 consecutive normal Pap tests and no abnormal Pap or HPV tests in the past 10 years.   HPV screening.** / Every 3 years from ages 30 through ages 65 or 70 with a history of 3 consecutive normal Pap tests. Testing can be stopped between 65 and 70 with 3 consecutive normal Pap tests and no abnormal Pap or HPV tests in the past 10 years.   Fecal occult blood test (FOBT) of stool. / Every year beginning at age 50 and continuing until age 75. You may not need to do this test if you get a colonoscopy every 10 years.   Flexible sigmoidoscopy or colonoscopy.** / Every 5 years for a flexible sigmoidoscopy or every 10 years for a colonoscopy beginning at age 50 and continuing until age 75.   Hepatitis   C blood test.** / For all people born from 1945 through 1965 and any individual with known risks for hepatitis C.   Osteoporosis screening.** / A one-time screening for women ages 65 and over and women at risk for fractures or osteoporosis.   Skin self-exam. / Monthly.   Influenza immunization.** / Every year.   Pneumococcal polysaccharide immunization.** / 1 dose at age 65 (or older) if you have never been vaccinated.   Tetanus, diphtheria, pertussis (Tdap, Td) immunization. / A one-time dose of Tdap vaccine if you are over 65 and have contact with an infant, are a healthcare worker, or simply want to be protected from whooping cough. After that, you need a Td booster dose every 10 years.   Varicella immunization.** / Consult your caregiver.   Meningococcal immunization.** / Consult your caregiver.   Hepatitis A immunization.** / Consult your caregiver. 2 doses, 6 to 18 months apart.   Hepatitis B immunization.** / Check with your caregiver. 3 doses, usually over 6 months.  ** Family history and personal history of risk and conditions may change your caregiver's  recommendations. Document Released: 10/31/2001 Document Revised: 08/24/2011 Document Reviewed: 01/30/2011 [employer] Patient Information 2012 [employer]. 

## 2011-11-27 NOTE — Progress Notes (Signed)
Subjective:    Wendy Christensen is a 66 y.o. female who presents for a welcome to Medicare exam.   Cardiac risk factors: dyslipidemia, family history of premature cardiovascular disease, hypertension, obesity (BMI >= 30 kg/m2) and sedentary lifestyle.  Activities of Daily Living  In your present state of health, do you have any difficulty performing the following activities?:  Preparing food and eating?: No Bathing yourself: No Getting dressed: No Using the toilet:No Moving around from place to place: No In the past year have you fallen or had a near fall?:No  Current exercise habits: The patient does not participate in regular exercise at present.   Dietary issues discussed: na   Depression Screen (Note: if answer to either of the following is "Yes", then a more complete depression screening is indicated)  Q1: Over the past two weeks, have you felt down, depressed or hopeless?no Q2: Over the past two weeks, have you felt little interest or pleasure in doing things? no   The following portions of the patient's history were reviewed and updated as appropriate: allergies, current medications, past family history, past medical history, past social history, past surgical history and problem list. Review of Systems  Review of Systems  Constitutional: Negative for activity change, appetite change and fatigue.  HENT: Negative for hearing loss, congestion, tinnitus and ear discharge.   Eyes: Negative for visual disturbance (see optho q1y -- vision corrected to 20/20 with glasses).  Respiratory: Negative for cough, chest tightness and shortness of breath.   Cardiovascular: Negative for chest pain, palpitations and leg swelling.  Gastrointestinal: Negative for abdominal pain, diarrhea, constipation and abdominal distention.  Genitourinary: Negative for urgency, frequency, decreased urine volume and difficulty urinating.  Musculoskeletal: Negative for back pain, arthralgias and gait  problem.  Skin: Negative for color change, pallor and rash.  Neurological: Negative for dizziness, light-headedness, numbness and headaches.  Hematological: Negative for adenopathy. Does not bruise/bleed easily.  Psychiatric/Behavioral: Negative for suicidal ideas, confusion, sleep disturbance, self-injury, dysphoric mood, decreased concentration and agitation.  Pt is able to read and write and can do all ADLs No risk for falling No abuse/ violence in home   Gi-- kaplan     Objective:     Vision by Snellen chart: ophth Blood pressure 144/86, pulse 66, temperature 98.6 F (37 C), temperature source Oral, height 5\' 6"  (1.676 m), weight 184 lb 3.2 oz (83.553 kg), SpO2 97.00%. Body mass index is 29.73 kg/(m^2). BP 144/86  Pulse 66  Temp(Src) 98.6 F (37 C) (Oral)  Ht 5\' 6"  (1.676 m)  Wt 184 lb 3.2 oz (83.553 kg)  BMI 29.73 kg/m2  SpO2 97%  General Appearance:    Alert, cooperative, no distress, appears stated age  Head:    Normocephalic, without obvious abnormality, atraumatic  Eyes:    PERRL, conjunctiva/corneas clear, EOM's intact, fundi    benign, both eyes  Ears:    Normal TM's and external ear canals, both ears  Nose:   Nares normal, septum midline, mucosa normal, no drainage    or sinus tenderness  Throat:   Lips, mucosa, and tongue normal; teeth and gums normal  Neck:   Supple, symmetrical, trachea midline, no adenopathy;    thyroid:  no enlargement/tenderness/nodules; no carotid   bruit or JVD  Back:     Symmetric, no curvature, ROM normal, no CVA tenderness  Lungs:     Clear to auscultation bilaterally, respirations unlabored  Chest Wall:    No tenderness or deformity   Heart:  Regular rate and rhythm, S1 and S2 normal, no murmur, rub   or gallop  Breast Exam:    No tenderness, masses, or nipple abnormality  Abdomen:     Soft, non-tender, bowel sounds active all four quadrants,    no masses, no organomegaly  Genitalia:    Normal female without lesion, discharge or  tenderness, pap done  Rectal:    Normal tone,, no masses or tenderness;   guaiac negative stool  Extremities:   Extremities normal, atraumatic, no cyanosis or edema  Pulses:   2+ and symmetric all extremities  Skin:   Skin color, texture, turgor normal, no rashes or lesions  Lymph nodes:   Cervical, supraclavicular, and axillary nodes normal  Neurologic:   CNII-XII intact, normal strength, sensation and reflexes    throughout      Assessment:    cpe     Plan:     During the course of the visit the patient was educated and counseled about appropriate screening and preventive services including:   Pneumococcal vaccine   Influenza vaccine  Td vaccine  Screening electrocardiogram  Screening mammography  Screening Pap smear and pelvic exam   Bone densitometry screening  Colorectal cancer screening  Advanced directives: has an advanced directive - a copy HAS NOT been provided.  Patient Instructions (the written plan) was given to the patient.

## 2011-11-27 NOTE — Assessment & Plan Note (Signed)
con't meds 

## 2011-11-27 NOTE — Progress Notes (Signed)
Addended by: Silvio Pate D on: 11/27/2011 11:54 AM   Modules accepted: Orders

## 2011-11-27 NOTE — Assessment & Plan Note (Signed)
con't meds  Check labs 

## 2011-11-27 NOTE — Assessment & Plan Note (Signed)
con't meds stable 

## 2011-11-27 NOTE — Assessment & Plan Note (Signed)
zegerid not working Will need to see formulary

## 2011-12-04 ENCOUNTER — Ambulatory Visit: Payer: Self-pay | Admitting: Obstetrics and Gynecology

## 2012-02-12 ENCOUNTER — Other Ambulatory Visit: Payer: Self-pay | Admitting: Family Medicine

## 2012-05-02 ENCOUNTER — Other Ambulatory Visit: Payer: Self-pay | Admitting: Obstetrics and Gynecology

## 2012-05-02 DIAGNOSIS — Z1231 Encounter for screening mammogram for malignant neoplasm of breast: Secondary | ICD-10-CM

## 2012-05-13 ENCOUNTER — Ambulatory Visit
Admission: RE | Admit: 2012-05-13 | Discharge: 2012-05-13 | Disposition: A | Discharge status: Home / Self Care | Payer: Medicare Other | Source: Ambulatory Visit | Attending: Obstetrics and Gynecology | Admitting: Obstetrics and Gynecology

## 2012-05-13 DIAGNOSIS — Z1231 Encounter for screening mammogram for malignant neoplasm of breast: Secondary | ICD-10-CM

## 2012-05-18 ENCOUNTER — Other Ambulatory Visit: Payer: Self-pay | Admitting: Family Medicine

## 2012-05-29 ENCOUNTER — Encounter: Payer: Self-pay | Admitting: Family Medicine

## 2012-05-29 ENCOUNTER — Ambulatory Visit (INDEPENDENT_AMBULATORY_CARE_PROVIDER_SITE_OTHER): Payer: Medicare Other | Admitting: Family Medicine

## 2012-05-29 VITALS — BP 132/84 | HR 57 | Temp 98.4°F | Wt 182.6 lb

## 2012-05-29 DIAGNOSIS — Z23 Encounter for immunization: Secondary | ICD-10-CM

## 2012-05-29 DIAGNOSIS — I10 Essential (primary) hypertension: Secondary | ICD-10-CM

## 2012-05-29 DIAGNOSIS — E785 Hyperlipidemia, unspecified: Secondary | ICD-10-CM

## 2012-05-29 LAB — BASIC METABOLIC PANEL
BUN: 12 mg/dL (ref 6–23)
CO2: 32 mEq/L (ref 19–32)
Calcium: 8.9 mg/dL (ref 8.4–10.5)
Glucose, Bld: 100 mg/dL — ABNORMAL HIGH (ref 70–99)
Sodium: 139 mEq/L (ref 135–145)

## 2012-05-29 LAB — HEPATIC FUNCTION PANEL
AST: 26 U/L (ref 0–37)
Albumin: 4 g/dL (ref 3.5–5.2)
Alkaline Phosphatase: 71 U/L (ref 39–117)
Total Protein: 7.8 g/dL (ref 6.0–8.3)

## 2012-05-29 LAB — LIPID PANEL: HDL: 49.7 mg/dL (ref >39.00)

## 2012-05-29 MED ORDER — LISINOPRIL-HYDROCHLOROTHIAZIDE 20-12.5 MG PO TABS: 2.0000 | ORAL_TABLET | Freq: Every day | ORAL | Status: DC

## 2012-05-29 MED ORDER — LOSARTAN POTASSIUM-HCTZ 100-12.5 MG PO TABS: 1.0000 | ORAL_TABLET | Freq: Every day | ORAL | Status: DC

## 2012-05-29 NOTE — Addendum Note (Signed)
Addended by: Arnette Norris on: 05/29/2012 09:24 AM   Modules accepted: Orders

## 2012-05-29 NOTE — Patient Instructions (Signed)

## 2012-05-29 NOTE — Progress Notes (Signed)
  Subjective:    Patient ID: Wendy Christensen, female    DOB: 1945/11/21, 66 y.o.   MRN: [id number]  HPI    Review of Systems     Objective:   Physical Exam        Assessment & Plan:   Subjective:    Patient here for follow-up of elevated blood pressure.  She is not exercising and is adherent to a low-salt diet.  Blood pressure is not well controlled at home. Cardiac symptoms: none. Patient denies: chest pain, chest pressure/discomfort, claudication, dyspnea, exertional chest pressure/discomfort, fatigue, irregular heart beat, lower extremity edema, near-syncope, orthopnea, palpitations, paroxysmal nocturnal dyspnea, syncope and tachypnea. Cardiovascular risk factors: dyslipidemia, hypertension and sedentary lifestyle. Use of agents associated with hypertension: none. History of target organ damage: none.  The following portions of the patient's history were reviewed and updated as appropriate: allergies, current medications, past family history, past medical history, past social history, past surgical history and problem list.  Review of Systems Pertinent items are noted in HPI.     Objective:    BP 132/84  Pulse 57  Temp 98.4 F (36.9 C) (Oral)  Wt 182 lb 9.6 oz (82.827 kg)  SpO2 96% General appearance: alert, cooperative and no distress Lungs: clear to auscultation bilaterally Heart: S1, S2 normal Extremities: extremities normal, atraumatic, no cyanosis or edema    Assessment:    Hypertension, stage 1 . Evidence of target organ damage: none.   hyperlipidemia---check labs Plan:    Medication: continue metoprolol, discontinue lisinopril and begin hyzaar. Dietary sodium restriction. Regular aerobic exercise. Check blood pressures 2-3 times weekly and record. Follow up: 2 weeks and as needed.

## 2012-06-06 ENCOUNTER — Telehealth: Payer: Self-pay | Admitting: Family Medicine

## 2012-06-06 NOTE — Telephone Encounter (Signed)
LM ON TRIAGE LINE 838am returned call from triage yesterday  Please call back at [phone]

## 2012-06-06 NOTE — Telephone Encounter (Signed)
LM on Triage line @ 239pm  cb [phone]

## 2012-06-07 NOTE — Telephone Encounter (Signed)
Pt notified of lab results

## 2012-06-12 ENCOUNTER — Encounter: Payer: Self-pay | Admitting: Family Medicine

## 2012-06-12 ENCOUNTER — Ambulatory Visit (INDEPENDENT_AMBULATORY_CARE_PROVIDER_SITE_OTHER): Payer: Medicare Other | Admitting: Family Medicine

## 2012-06-12 VITALS — BP 138/74 | HR 73 | Temp 98.8°F | Wt 183.0 lb

## 2012-06-12 DIAGNOSIS — J309 Allergic rhinitis, unspecified: Secondary | ICD-10-CM

## 2012-06-12 DIAGNOSIS — E876 Hypokalemia: Secondary | ICD-10-CM

## 2012-06-12 DIAGNOSIS — Z9109 Other allergy status, other than to drugs and biological substances: Secondary | ICD-10-CM

## 2012-06-12 MED ORDER — POTASSIUM CHLORIDE ER 10 MEQ PO TBCR: 10.0000 meq | EXTENDED_RELEASE_TABLET | Freq: Two times a day (BID) | ORAL | Status: DC

## 2012-06-12 MED ORDER — MOMETASONE FUROATE 50 MCG/ACT NA SUSP: 2.0000 | Freq: Every day | NASAL | Status: DC

## 2012-06-12 NOTE — Progress Notes (Signed)
  Subjective:    Patient here for follow-up of elevated blood pressure.  She in not exercising and is  adherent to a low-salt diet.  Blood pressure is well controlled at home. Cardiac symptoms: none.. Cardiovascular risk factors: htn, and hyperlipidemia. Use of agents associated with hypertension: none. History of target organ damage: pt also c/o sneezing and runny nose for past several weeks.  No fever headaches etc..    Review of Systems As above    Objective:     Filed Vitals:   06/12/12 0844  BP: 138/74  Pulse: 73  Temp: 98.8 F (37.1 C)  TempSrc: Oral  Weight: 183 lb (83.008 kg)  SpO2: 98%  Gen-- AAOx3  , nad HEENT--  Eyes --Wendy Christensen, eomi                   Ears-- normal b/l                    Nose--  Clear drainage, turb red and swollen Cor +s1S2  No murmur Lungs-- ctab/l  -rrw Ext -- no cce   Assessment:    Hypertension, . Seasonal allergies. =-- con't antihistamine and sample nasonex   Plan:    con't meds rto 6 months

## 2012-06-12 NOTE — Patient Instructions (Addendum)
Allergies, Generic Allergies may happen from anything your body is sensitive to. This may be food, medicines, pollens, chemicals, and nearly anything around you in everyday life that produces allergens. An allergen is anything that causes an allergy producing substance. Heredity is often a factor in causing these problems. This means you may have some of the same allergies as your parents. Food allergies happen in all age groups. Food allergies are some of the most severe and life threatening. Some common food allergies are cow's milk, seafood, eggs, nuts, wheat, and soybeans. SYMPTOMS   Swelling around the mouth.   An itchy red rash or hives.   Vomiting or diarrhea.   Difficulty breathing.  SEVERE ALLERGIC REACTIONS ARE LIFE-THREATENING. This reaction is called anaphylaxis. It can cause the mouth and throat to swell and cause difficulty with breathing and swallowing. In severe reactions only a trace amount of food (for example, peanut oil in a salad) may cause death within seconds. Seasonal allergies occur in all age groups. These are seasonal because they usually occur during the same season every year. They may be a reaction to molds, grass pollens, or tree pollens. Other causes of problems are house dust mite allergens, pet dander, and mold spores. The symptoms often consist of nasal congestion, a runny itchy nose associated with sneezing, and tearing itchy eyes. There is often an associated itching of the mouth and ears. The problems happen when you come in contact with pollens and other allergens. Allergens are the particles in the air that the body reacts to with an allergic reaction. This causes you to release allergic antibodies. Through a chain of events, these eventually cause you to release histamine into the blood stream. Although it is meant to be protective to the body, it is this release that causes your discomfort. This is why you were given anti-histamines to feel better. If you are  unable to pinpoint the offending allergen, it may be determined by skin or blood testing. Allergies cannot be cured but can be controlled with medicine. Hay fever is a collection of all or some of the seasonal allergy problems. It may often be treated with simple over-the-counter medicine such as diphenhydramine. Take medicine as directed. Do not drink alcohol or drive while taking this medicine. Check with your caregiver or package insert for child dosages. If these medicines are not effective, there are many new medicines your caregiver can prescribe. Stronger medicine such as nasal spray, eye drops, and corticosteroids may be used if the first things you try do not work well. Other treatments such as immunotherapy or desensitizing injections can be used if all else fails. Follow up with your caregiver if problems continue. These seasonal allergies are usually not life threatening. They are generally more of a nuisance that can often be handled using medicine. HOME CARE INSTRUCTIONS   If unsure what causes a reaction, keep a diary of foods eaten and symptoms that follow. Avoid foods that cause reactions.   If hives or rash are present:   Take medicine as directed.   You may use an over-the-counter antihistamine (diphenhydramine) for hives and itching as needed.   Apply cold compresses (cloths) to the skin or take baths in cool water. Avoid hot baths or showers. Heat will make a rash and itching worse.   If you are severely allergic:   Following a treatment for a severe reaction, hospitalization is often required for closer follow-up.   Wear a medic-alert bracelet or necklace stating the allergy.     You and your family must learn how to give adrenaline or use an anaphylaxis kit.   If you have had a severe reaction, always carry your anaphylaxis kit or EpiPen with you. Use this medicine as directed by your caregiver if a severe reaction is occurring. Failure to do so could have a fatal  outcome.  SEEK MEDICAL CARE IF:  You suspect a food allergy. Symptoms generally happen within 30 minutes of eating a food.   Your symptoms have not gone away within 2 days or are getting worse.   You develop new symptoms.   You want to retest yourself or your child with a food or drink you think causes an allergic reaction. Never do this if an anaphylactic reaction to that food or drink has happened before. Only do this under the care of a caregiver.  SEEK IMMEDIATE MEDICAL CARE IF:   You have difficulty breathing, are wheezing, or have a tight feeling in your chest or throat.   You have a swollen mouth, or you have hives, swelling, or itching all over your body.   You have had a severe reaction that has responded to your anaphylaxis kit or an EpiPen. These reactions may return when the medicine has worn off. These reactions should be considered life threatening.  MAKE SURE YOU:   Understand these instructions.   Will watch your condition.   Will get help right away if you are not doing well or get worse.  Document Released: 11/28/2002 Document Revised: 08/24/2011 Document Reviewed: 05/04/2008 [employer] Patient Information 2012 [employer]. 

## 2012-06-13 LAB — ~~LOC~~ ALLERGY PANEL
Allergen, Cedar tree, t12: <0.1 kU/L
Allergen, Comm Silver Birch, t9: <0.1 kU/L
Bahia Grass: <0.1 kU/L
Bermuda Grass: <0.1 kU/L
Box Elder IgE: <0.1 kU/L
Cockroach: <0.1 kU/L
Common Ragweed: 0.12 kU/L — ABNORMAL HIGH
D. farinae: <0.1 kU/L
Elm IgE: <0.1 kU/L
Johnson Grass: <0.1 kU/L
Mugwort: <0.1 kU/L
Nettle: <0.1 kU/L
Oak: <0.1 kU/L
Pecan/Hickory Tree IgE: <0.1 kU/L
Penicillium Notatum: <0.1 kU/L
Sheep Sorrel IgE: <0.1 kU/L
Timothy Grass: <0.1 kU/L

## 2012-06-17 ENCOUNTER — Telehealth: Payer: Self-pay

## 2012-06-17 NOTE — Telephone Encounter (Signed)
Pt called in for lab results. Advised pt of rag weed allergy. Pt stated understanding.         MW

## 2012-08-07 ENCOUNTER — Other Ambulatory Visit: Payer: Self-pay

## 2012-08-07 MED ORDER — ATORVASTATIN CALCIUM 40 MG PO TABS: 40.0000 mg | ORAL_TABLET | Freq: Every day | ORAL | Status: DC

## 2012-08-31 ENCOUNTER — Other Ambulatory Visit: Payer: Self-pay | Admitting: Family Medicine

## 2012-09-02 ENCOUNTER — Telehealth: Payer: Self-pay | Admitting: *Deleted

## 2012-09-02 NOTE — Telephone Encounter (Signed)
Samples of Nasonex placed up front for pt to p/u.

## 2012-09-30 ENCOUNTER — Other Ambulatory Visit: Payer: Self-pay | Admitting: Family Medicine

## 2012-09-30 NOTE — Telephone Encounter (Signed)
Need bmp Then ok to refill when appointment made

## 2012-09-30 NOTE — Telephone Encounter (Signed)
Last labs were on 06/12/12 and potassium was 3.3. Please advise     KP

## 2012-09-30 NOTE — Telephone Encounter (Signed)
Please offer this patient a lab apt to recheck potassium. Thanks      KP

## 2012-10-01 NOTE — Telephone Encounter (Signed)
Called pt on home phone LMOM to call & schedule labs

## 2012-10-14 ENCOUNTER — Ambulatory Visit: Payer: Medicare Other | Admitting: Obstetrics and Gynecology

## 2012-10-14 ENCOUNTER — Other Ambulatory Visit (INDEPENDENT_AMBULATORY_CARE_PROVIDER_SITE_OTHER): Payer: Medicare Other

## 2012-10-14 ENCOUNTER — Encounter: Payer: Self-pay | Admitting: Obstetrics and Gynecology

## 2012-10-14 VITALS — BP 122/78 | Resp 18 | Ht 66.0 in | Wt 190.0 lb

## 2012-10-14 DIAGNOSIS — E876 Hypokalemia: Secondary | ICD-10-CM

## 2012-10-14 DIAGNOSIS — Z124 Encounter for screening for malignant neoplasm of cervix: Secondary | ICD-10-CM

## 2012-10-14 DIAGNOSIS — Z01419 Encounter for gynecological examination (general) (routine) without abnormal findings: Secondary | ICD-10-CM

## 2012-10-14 LAB — BASIC METABOLIC PANEL
BUN: 14 mg/dL (ref 6–23)
Chloride: 104 mEq/L (ref 96–112)
GFR: 69.96 mL/min (ref >60.00)
Potassium: 3.6 mEq/L (ref 3.5–5.1)

## 2012-10-14 NOTE — Progress Notes (Signed)
  Subjective:    Wendy Christensen is a 67 y.o. female No obstetric history on file. who presents for annual exam. The patient has a history of pelvic organ prolapse.  She reports that her symptoms of the same.  She has a history of anxiety. The patient complaints of trouble sleeping.  She is to be tested for sleep apnea.  The following portions of the patient's history were reviewed and updated as appropriate: allergies, current medications, past family history, past medical history, past social history, past surgical history and problem list.  Review of Systems Pertinent items are noted in HPI. Gastrointestinal:No change in bowel habits, no abdominal pain, no rectal bleeding Genitourinary:negative for dysuria, frequency, hematuria, nocturia and urinary incontinence    Objective:     BP 122/78  Resp 18  Ht 5\' 6"  (1.676 m)  Wt 190 lb (86.183 kg)  BMI 30.67 kg/m2  Weight:  Wt Readings from Last 1 Encounters:  10/14/12 190 lb (86.183 kg)     BMI: Body mass index is 30.67 kg/(m^2). General Appearance: Alert, appropriate appearance for age. No acute distress HEENT: Grossly normal Neck / Thyroid: Supple, no masses, nodes or enlargement Lungs: clear to auscultation bilaterally Back: No CVA tenderness Breast Exam: No masses or nodes.No dimpling, nipple retraction or discharge. Cardiovascular: Regular rate and rhythm. S1, S2, no murmur Gastrointestinal: Soft, non-tender, no masses or organomegaly  ++++++++++++++++++++++++++++++++++++++++++++++++++++++++  Pelvic Exam: External genitalia: normal general appearance Vaginal: normal without tenderness, induration or masses and stage III prolapse Cervix: normal appearance Adnexa: normal bimanual exam Uterus: upper limits normal size Rectovaginal: normal rectal, no masses  ++++++++++++++++++++++++++++++++++++++++++++++++++++++++  Lymphatic Exam: Non-palpable nodes in neck, clavicular, axillary, or inguinal regions  Psychiatric: Alert  and oriented, appropriate affect.      Assessment:    stage III pelvic organ prolapse   Overweight or obese: Yes  Pelvic relaxation: Yes  Menopausal symptoms: No. Severe: No.  Anxiety  Sleep apnea   Plan:    Pap smear.   Bone density scan  Follow-up:  for annual exam  The updated Pap smear screening guidelines were discussed with the patient. The patient requested that I obtain a Pap smear: Yes.  Kegel exercises discussed: Yes.  Proper diet and regular exercise were reviewed.  Annual mammograms recommended starting at age 46. Proper breast care was discussed.  Screening colonoscopy is recommended beginning at age 34.  Regular health maintenance was reviewed.  Sleep hygiene was discussed.  Adequate calcium and vitamin D intake was emphasized.  Leonard Schwartz M.D.   Regular Periods: no Mammogram: yes  Monthly Breast Ex.: yes Exercise: yes  Tetanus < 10 years: yes Seatbelts: yes  NI. Bladder Functn.: yes Abuse at home: no  Daily BM's: yes Stressful Work: no  Healthy Diet: yes Sigmoid-Colonoscopy: yes per pt 2012 WNL  Calcium: no Medical problems this year: per pt none.   LAST ZOX:0960 w/ Dr. Laury Axon "WNL" pt wants pap today.   Contraception: none  Mammogram:  04/2012 "WNL"  PCP: Dr.Lowne  PMH: no Changes  FMH: no changes  Last Bone Scan: yes per pt , she cannot recall when.

## 2012-10-16 LAB — PAP IG W/ RFLX HPV ASCU

## 2012-11-12 ENCOUNTER — Other Ambulatory Visit: Payer: Self-pay | Admitting: Family Medicine

## 2012-11-25 ENCOUNTER — Other Ambulatory Visit: Payer: Medicare Other

## 2012-12-10 ENCOUNTER — Ambulatory Visit: Payer: Medicare Other | Admitting: Family Medicine

## 2012-12-10 ENCOUNTER — Encounter: Payer: Medicare Other | Admitting: Family Medicine

## 2012-12-16 ENCOUNTER — Ambulatory Visit (INDEPENDENT_AMBULATORY_CARE_PROVIDER_SITE_OTHER): Payer: Medicare Other | Admitting: Family Medicine

## 2012-12-16 ENCOUNTER — Encounter: Payer: Self-pay | Admitting: Family Medicine

## 2012-12-16 VITALS — BP 140/72 | HR 71 | Temp 98.8°F | Ht 65.0 in | Wt 188.6 lb

## 2012-12-16 DIAGNOSIS — Z0000 Encounter for general adult medical examination without abnormal findings: Secondary | ICD-10-CM

## 2012-12-16 DIAGNOSIS — R739 Hyperglycemia, unspecified: Secondary | ICD-10-CM

## 2012-12-16 DIAGNOSIS — R7309 Other abnormal glucose: Secondary | ICD-10-CM

## 2012-12-16 DIAGNOSIS — Z23 Encounter for immunization: Secondary | ICD-10-CM

## 2012-12-16 DIAGNOSIS — I10 Essential (primary) hypertension: Secondary | ICD-10-CM

## 2012-12-16 DIAGNOSIS — E785 Hyperlipidemia, unspecified: Secondary | ICD-10-CM

## 2012-12-16 DIAGNOSIS — R0989 Other specified symptoms and signs involving the circulatory and respiratory systems: Secondary | ICD-10-CM

## 2012-12-16 DIAGNOSIS — R0683 Snoring: Secondary | ICD-10-CM

## 2012-12-16 DIAGNOSIS — R002 Palpitations: Secondary | ICD-10-CM

## 2012-12-16 DIAGNOSIS — F411 Generalized anxiety disorder: Secondary | ICD-10-CM

## 2012-12-16 LAB — CBC WITH DIFFERENTIAL/PLATELET
Basophils Absolute: 0 10*3/uL (ref 0.0–0.1)
Eosinophils Absolute: 0.2 10*3/uL (ref 0.0–0.7)
Lymphocytes Relative: 26 % (ref 12.0–46.0)
MCHC: 32.7 g/dL (ref 30.0–36.0)
Monocytes Relative: 7.4 % (ref 3.0–12.0)
Neutrophils Relative %: 61.8 % (ref 43.0–77.0)
RBC: 4.43 Mil/uL (ref 3.87–5.11)
RDW: 14.1 % (ref 11.5–14.6)

## 2012-12-16 LAB — HEPATIC FUNCTION PANEL
AST: 25 U/L (ref 0–37)
Alkaline Phosphatase: 71 U/L (ref 39–117)
Bilirubin, Direct: 0 mg/dL (ref 0.0–0.3)

## 2012-12-16 LAB — BASIC METABOLIC PANEL
CO2: 26 mEq/L (ref 19–32)
Calcium: 9.2 mg/dL (ref 8.4–10.5)
Creatinine, Ser: 1.3 mg/dL — ABNORMAL HIGH (ref 0.4–1.2)
GFR: 54.3 mL/min — ABNORMAL LOW (ref >60.00)
Glucose, Bld: 94 mg/dL (ref 70–99)
Sodium: 138 mEq/L (ref 135–145)

## 2012-12-16 LAB — HEMOGLOBIN A1C: Hgb A1c MFr Bld: 6.4 % (ref 4.6–6.5)

## 2012-12-16 LAB — LIPID PANEL
HDL: 40.7 mg/dL (ref >39.00)
LDL Cholesterol: 123 mg/dL — ABNORMAL HIGH (ref 0–99)
Total CHOL/HDL Ratio: 5
VLDL: 30.6 mg/dL (ref 0.0–40.0)

## 2012-12-16 MED ORDER — ATORVASTATIN CALCIUM 40 MG PO TABS: ORAL_TABLET | ORAL | Status: DC

## 2012-12-16 MED ORDER — METOPROLOL TARTRATE 100 MG PO TABS: ORAL_TABLET | ORAL | Status: DC

## 2012-12-16 MED ORDER — LOSARTAN POTASSIUM-HCTZ 100-12.5 MG PO TABS: 1.0000 | ORAL_TABLET | Freq: Every day | ORAL | Status: DC

## 2012-12-16 NOTE — Assessment & Plan Note (Signed)
Stable con't meds 

## 2012-12-16 NOTE — Progress Notes (Signed)
Subjective:    Wendy Christensen is a 67 y.o. female who presents for Medicare Initial preventive examination.  Preventive Screening-Counseling & Management  Tobacco History  Smoking status  . Never Smoker   Smokeless tobacco  . Never Used     Problems Prior to Visit 1. none  Current Problems (verified) Patient Active Problem List  Diagnosis  . GOITER NOS  . HYPERLIPIDEMIA  . ANXIETY  . GENERALIZED ANXIETY DISORDER  . ANXIETY DEPRESSION  . HYPERTENSION  . SINUSITIS- ACUTE-NOS  . ALLERGIC RHINITIS  . GERD  . SKIN TAG  . INSOMNIA  . NUMBNESS, ARM  . WEIGHT LOSS  . COLONOSCOPY, HX OF  . DILATION AND CURETTAGE, HX OF  . POSTMENOPAUSAL STATUS  . BACK PAIN, THORACIC REGION, LEFT    Medications Prior to Visit Current Outpatient Prescriptions on File Prior to Visit  Medication Sig Dispense Refill  . aspirin 81 MG tablet Take 81 mg by mouth daily.      . citalopram (CELEXA) 40 MG tablet Take 40 mg by mouth daily.        . ferrous sulfate 325 (65 FE) MG tablet Take 325 mg by mouth daily with breakfast.      . KLOR-CON 10 10 MEQ tablet TAKE 1 TABLET BY MOUTH TWICE DAILY  60 tablet  0  . mirtazapine (REMERON) 45 MG tablet Take 45 mg by mouth at bedtime.        . mometasone (NASONEX) 50 MCG/ACT nasal spray Place 2 sprays into the nose daily.  17 g  12  . Multiple Vitamins-Minerals (MULTIVITAMIN WITH MINERALS) tablet Take 1 tablet by mouth daily.      Maxwell Caul Bicarbonate (ZEGERID OTC) 20-1100 MG CAPS Take 1 capsule by mouth daily before breakfast.       No current facility-administered medications on file prior to visit.    Current Medications (verified) Current Outpatient Prescriptions  Medication Sig Dispense Refill  . aspirin 81 MG tablet Take 81 mg by mouth daily.      Marland Kitchen atorvastatin (LIPITOR) 40 MG tablet 1 tab by mouth daily  90 tablet  1  . citalopram (CELEXA) 40 MG tablet Take 40 mg by mouth daily.        . ferrous sulfate 325 (65 FE) MG tablet  Take 325 mg by mouth daily with breakfast.      . KLOR-CON 10 10 MEQ tablet TAKE 1 TABLET BY MOUTH TWICE DAILY  60 tablet  0  . losartan-hydrochlorothiazide (HYZAAR) 100-12.5 MG per tablet Take 1 tablet by mouth daily.  90 tablet  1  . metoprolol (LOPRESSOR) 100 MG tablet TAKE 1 TABLET BY MOUTH TWICE DAILY  180 tablet  1  . mirtazapine (REMERON) 45 MG tablet Take 45 mg by mouth at bedtime.        . mometasone (NASONEX) 50 MCG/ACT nasal spray Place 2 sprays into the nose daily.  17 g  12  . Multiple Vitamins-Minerals (MULTIVITAMIN WITH MINERALS) tablet Take 1 tablet by mouth daily.      Maxwell Caul Bicarbonate (ZEGERID OTC) 20-1100 MG CAPS Take 1 capsule by mouth daily before breakfast.       No current facility-administered medications for this visit.     Allergies (verified) Review of patient's allergies indicates no known allergies.   PAST HISTORY  Family History Family History  Problem Relation Age of Onset  . Hypertension Mother   . COPD Father   . Hyperlipidemia    . Hypertension  Social History History  Substance Use Topics  . Smoking status: Never Smoker   . Smokeless tobacco: Never Used  . Alcohol Use: No     Are there smokers in your home (other than you)? No  Risk Factors Current exercise habits: The patient does not participate in regular exercise at present. --except chasing a 16 m  Dietary issues discussed: na   Cardiac risk factors: advanced age (older than 28 for men, 40 for women), dyslipidemia, hypertension and sedentary lifestyle.  Depression Screen (Note: if answer to either of the following is "Yes", a more complete depression screening is indicated)   Over the past 2 weeks, have you felt down, depressed or hopeless? No  Over the past 2 weeks, have you felt little interest or pleasure in doing things? No  Have you lost interest or pleasure in daily life? No  Do you often feel hopeless? No  Do you cry easily over simple problems?  No  Activities of Daily Living In your present state of health, do you have any difficulty performing the following activities?:  Driving? No Managing money?  No Feeding yourself? No Getting from bed to chair? No Climbing a flight of stairs? No Preparing food and eating?: No Bathing or showering? No Getting dressed: No Getting to the toilet? No Using the toilet:No Moving around from place to place: No In the past year have you fallen or had a near fall?:No   Are you sexually active?  Yes  Do you have more than one partner?  No  Hearing Difficulties: No Do you often ask people to speak up or repeat themselves? No Do you experience ringing or noises in your ears? No Do you have difficulty understanding soft or whispered voices? No   Do you feel that you have a problem with memory? No  Do you often misplace items? No  Do you feel safe at home?  No  Cognitive Testing  Alert? Yes  Normal Appearance?Yes  Oriented to person? Yes  Place? Yes   Time? Yes  Recall of three objects?  Yes  Can perform simple calculations? Yes  Displays appropriate judgment?Yes  Can read the correct time from a watch face?Yes   Advanced Directives have been discussed with the patient? Yes  List the Names of Other Physician/Practitioners you currently use: 1.  Gyn-- stringer 2 oph-- [facility] 3 dentist-- Maisie Fus  Indicate any recent Medical Services you may have received from other than [facility] providers in the past year (date may be approximate).  Immunization History  Administered Date(s) Administered  . Influenza Split 05/29/2012  . Influenza Whole 07/31/2007  . Td 04/18/2005  . Zoster 04/24/2008    Screening Tests Health Maintenance  Topic Date Due  . Influenza Vaccine  05/19/2013  . Mammogram  05/13/2014  . Tetanus/tdap  04/19/2015  . Colonoscopy  11/13/2016  . Pneumococcal Polysaccharide Vaccine Age 66 And Over  Completed  . Zostavax  Completed    All answers were reviewed with  the patient and necessary referrals were made:  Loreen Freud, DO   12/16/2012   History reviewed:  She  has a past medical history of GERD (gastroesophageal reflux disease); Hypertension; Hyperlipidemia; Anxiety; Allergic rhinitis; Anemia; Depression; and Thyroid goiter. She  does not have any pertinent problems on file. She  has no past surgical history on file. Her family history includes COPD in her father; Hyperlipidemia in an unspecified family member; and Hypertension in her mother and unspecified family member. She  reports  that she has never smoked. She has never used smokeless tobacco. She reports that she does not drink alcohol or use illicit drugs. She has a current medication list which includes the following prescription(s): aspirin, atorvastatin, citalopram, ferrous sulfate, klor-con 10, losartan-hydrochlorothiazide, metoprolol, mirtazapine, mometasone, multivitamin with minerals, and omeprazole-sodium bicarbonate. Current Outpatient Prescriptions on File Prior to Visit  Medication Sig Dispense Refill  . aspirin 81 MG tablet Take 81 mg by mouth daily.      . citalopram (CELEXA) 40 MG tablet Take 40 mg by mouth daily.        . ferrous sulfate 325 (65 FE) MG tablet Take 325 mg by mouth daily with breakfast.      . KLOR-CON 10 10 MEQ tablet TAKE 1 TABLET BY MOUTH TWICE DAILY  60 tablet  0  . mirtazapine (REMERON) 45 MG tablet Take 45 mg by mouth at bedtime.        . mometasone (NASONEX) 50 MCG/ACT nasal spray Place 2 sprays into the nose daily.  17 g  12  . Multiple Vitamins-Minerals (MULTIVITAMIN WITH MINERALS) tablet Take 1 tablet by mouth daily.      Maxwell Caul Bicarbonate (ZEGERID OTC) 20-1100 MG CAPS Take 1 capsule by mouth daily before breakfast.       No current facility-administered medications on file prior to visit.   She has No Known Allergies.  Review of Systems  Review of Systems  Constitutional: Negative for activity change, appetite change and fatigue.   HENT: Negative for hearing loss, congestion, tinnitus and ear discharge.   Eyes: Negative for visual disturbance (see optho q1y -- vision corrected to 20/20 with glasses).  Respiratory: Negative for cough, chest tightness and shortness of breath.   Cardiovascular: Negative for chest pain, palpitations and leg swelling.  Gastrointestinal: Negative for abdominal pain, diarrhea, constipation and abdominal distention.  Genitourinary: Negative for urgency, frequency, decreased urine volume and difficulty urinating.  Musculoskeletal: Negative for back pain, arthralgias and gait problem.  Skin: Negative for color change, pallor and rash.  Neurological: Negative for dizziness, light-headedness, numbness and headaches.  Hematological: Negative for adenopathy. Does not bruise/bleed easily.  Psychiatric/Behavioral: Negative for suicidal ideas, confusion, sleep disturbance, self-injury, dysphoric mood, decreased concentration and agitation.  Pt is able to read and write and can do all ADLs No risk for falling No abuse/ violence in home      Objective:     Vision by Snellen chart: opth  Body mass index is 31.38 kg/(m^2). BP 140/72  Pulse 71  Temp(Src) 98.8 F (37.1 C) (Oral)  Ht 5\' 5"  (1.651 m)  Wt 188 lb 9.6 oz (85.548 kg)  BMI 31.38 kg/m2  SpO2 95%  BP 140/72  Pulse 71  Temp(Src) 98.8 F (37.1 C) (Oral)  Ht 5\' 5"  (1.651 m)  Wt 188 lb 9.6 oz (85.548 kg)  BMI 31.38 kg/m2  SpO2 95% General appearance: alert, cooperative, appears stated age and no distress Head: Normocephalic, without obvious abnormality, atraumatic Eyes: conjunctivae/corneas clear. PERRL, EOM's intact. Fundi benign. Ears: normal TM's and external ear canals both ears Nose: Nares normal. Septum midline. Mucosa normal. No drainage or sinus tenderness. Throat: lips, mucosa, and tongue normal; teeth and gums normal Neck: + thyroid enlarged, supple Back: symmetric, no curvature. ROM normal. No CVA tenderness. Lungs:  clear to auscultation bilaterally Breasts: gyn Heart: S1, S2 normal Abdomen: soft, non-tender; bowel sounds normal; no masses,  no organomegaly Pelvic: deferred---gyn Extremities: extremities normal, atraumatic, no cyanosis or edema Pulses: 2+ and symmetric Skin: Skin  color, texture, turgor normal. No rashes or lesions Lymph nodes: Cervical, supraclavicular, and axillary nodes normal. Neurologic: Alert and oriented X 3, normal strength and tone. Normal symmetric reflexes. Normal coordination and gait Psych-- no depression, anxiety    Assessment:    cpe     Plan:     During the course of the visit the patient was educated and counseled about appropriate screening and preventive services including:    Pneumococcal vaccine   Screening electrocardiogram  Screening mammography  Screening Pap smear and pelvic exam   Bone densitometry screening  Colorectal cancer screening  Diabetes screening  Glaucoma screening  Advanced directives: has NO advanced directive - not interested in additional information  Diet review for nutrition referral? Yes ____  Not Indicated ___x_   Patient Instructions (the written plan) was given to the patient.  Medicare Attestation I have personally reviewed: The patient's medical and social history Their use of alcohol, tobacco or illicit drugs Their current medications and supplements The patient's functional ability including ADLs,fall risks, home safety risks, cognitive, and hearing and visual impairment Diet and physical activities Evidence for depression or mood disorders  The patient's weight, height, BMI, and visual acuity have been recorded in the chart.  I have made referrals, counseling, and provided education to the patient based on review of the above and I have provided the patient with a written personalized care plan for preventive services.     Loreen Freud, DO   12/16/2012

## 2012-12-16 NOTE — Assessment & Plan Note (Signed)
Check labs con't meds 

## 2012-12-16 NOTE — Assessment & Plan Note (Signed)
con't meds 

## 2012-12-16 NOTE — Patient Instructions (Signed)
Preventive Care for Adults, Female A healthy lifestyle and preventive care can promote health and wellness. Preventive health guidelines for women include the following key practices.  A routine yearly physical is a good way to check with your caregiver about your health and preventive screening. It is a chance to share any concerns and updates on your health, and to receive a thorough exam.  Visit your dentist for a routine exam and preventive care every 6 months. Brush your teeth twice a day and floss once a day. Good oral hygiene prevents tooth decay and gum disease.  The frequency of eye exams is based on your age, health, family medical history, use of contact lenses, and other factors. Follow your caregiver's recommendations for frequency of eye exams.  Eat a healthy diet. Foods like vegetables, fruits, whole grains, low-fat dairy products, and lean protein foods contain the nutrients you need without too many calories. Decrease your intake of foods high in solid fats, added sugars, and salt. Eat the right amount of calories for you.Get information about a proper diet from your caregiver, if necessary.  Regular physical exercise is one of the most important things you can do for your health. Most adults should get at least 150 minutes of moderate-intensity exercise (any activity that increases your heart rate and causes you to sweat) each week. In addition, most adults need muscle-strengthening exercises on 2 or more days a week.  Maintain a healthy weight. The body mass index (BMI) is a screening tool to identify possible weight problems. It provides an estimate of body fat based on height and weight. Your caregiver can help determine your BMI, and can help you achieve or maintain a healthy weight.For adults 20 years and older:  A BMI below 18.5 is considered underweight.  A BMI of 18.5 to 24.9 is normal.  A BMI of 25 to 29.9 is considered overweight.  A BMI of 30 and above is  considered obese.  Maintain normal blood lipids and cholesterol levels by exercising and minimizing your intake of saturated fat. Eat a balanced diet with plenty of fruit and vegetables. Blood tests for lipids and cholesterol should begin at age 20 and be repeated every 5 years. If your lipid or cholesterol levels are high, you are over 50, or you are at high risk for heart disease, you may need your cholesterol levels checked more frequently.Ongoing high lipid and cholesterol levels should be treated with medicines if diet and exercise are not effective.  If you smoke, find out from your caregiver how to quit. If you do not use tobacco, do not start.  If you are pregnant, do not drink alcohol. If you are breastfeeding, be very cautious about drinking alcohol. If you are not pregnant and choose to drink alcohol, do not exceed 1 drink per day. One drink is considered to be 12 ounces (355 mL) of beer, 5 ounces (148 mL) of wine, or 1.5 ounces (44 mL) of liquor.  Avoid use of street drugs. Do not share needles with anyone. Ask for help if you need support or instructions about stopping the use of drugs.  High blood pressure causes heart disease and increases the risk of stroke. Your blood pressure should be checked at least every 1 to 2 years. Ongoing high blood pressure should be treated with medicines if weight loss and exercise are not effective.  If you are 55 to 65 years old, ask your caregiver if you should take aspirin to prevent strokes.  Diabetes   screening involves taking a blood sample to check your fasting blood sugar level. This should be done once every 3 years, after age 45, if you are within normal weight and without risk factors for diabetes. Testing should be considered at a younger age or be carried out more frequently if you are overweight and have at least 1 risk factor for diabetes.  Breast cancer screening is essential preventive care for women. You should practice "breast  self-awareness." This means understanding the normal appearance and feel of your breasts and may include breast self-examination. Any changes detected, no matter how small, should be reported to a caregiver. Women in their 20s and 30s should have a clinical breast exam (CBE) by a caregiver as part of a regular health exam every 1 to 3 years. After age 40, women should have a CBE every year. Starting at age 40, women should consider having a mammography (breast X-ray test) every year. Women who have a family history of breast cancer should talk to their caregiver about genetic screening. Women at a high risk of breast cancer should talk to their caregivers about having magnetic resonance imaging (MRI) and a mammography every year.  The Pap test is a screening test for cervical cancer. A Pap test can show cell changes on the cervix that might become cervical cancer if left untreated. A Pap test is a procedure in which cells are obtained and examined from the lower end of the uterus (cervix).  Women should have a Pap test starting at age 21.  Between ages 21 and 29, Pap tests should be repeated every 2 years.  Beginning at age 30, you should have a Pap test every 3 years as long as the past 3 Pap tests have been normal.  Some women have medical problems that increase the chance of getting cervical cancer. Talk to your caregiver about these problems. It is especially important to talk to your caregiver if a new problem develops soon after your last Pap test. In these cases, your caregiver may recommend more frequent screening and Pap tests.  The above recommendations are the same for women who have or have not gotten the vaccine for human papillomavirus (HPV).  If you had a hysterectomy for a problem that was not cancer or a condition that could lead to cancer, then you no longer need Pap tests. Even if you no longer need a Pap test, a regular exam is a good idea to make sure no other problems are  starting.  If you are between ages 65 and 70, and you have had normal Pap tests going back 10 years, you no longer need Pap tests. Even if you no longer need a Pap test, a regular exam is a good idea to make sure no other problems are starting.  If you have had past treatment for cervical cancer or a condition that could lead to cancer, you need Pap tests and screening for cancer for at least 20 years after your treatment.  If Pap tests have been discontinued, risk factors (such as a new sexual partner) need to be reassessed to determine if screening should be resumed.  The HPV test is an additional test that may be used for cervical cancer screening. The HPV test looks for the virus that can cause the cell changes on the cervix. The cells collected during the Pap test can be tested for HPV. The HPV test could be used to screen women aged 30 years and older, and should   be used in women of any age who have unclear Pap test results. After the age of 30, women should have HPV testing at the same frequency as a Pap test.  Colorectal cancer can be detected and often prevented. Most routine colorectal cancer screening begins at the age of 50 and continues through age 75. However, your caregiver may recommend screening at an earlier age if you have risk factors for colon cancer. On a yearly basis, your caregiver may provide home test kits to check for hidden blood in the stool. Use of a small camera at the end of a tube, to directly examine the colon (sigmoidoscopy or colonoscopy), can detect the earliest forms of colorectal cancer. Talk to your caregiver about this at age 50, when routine screening begins. Direct examination of the colon should be repeated every 5 to 10 years through age 75, unless early forms of pre-cancerous polyps or small growths are found.  Hepatitis C blood testing is recommended for all people born from 1945 through 1965 and any individual with known risks for hepatitis C.  Practice  safe sex. Use condoms and avoid high-risk sexual practices to reduce the spread of sexually transmitted infections (STIs). STIs include gonorrhea, chlamydia, syphilis, trichomonas, herpes, HPV, and human immunodeficiency virus (HIV). Herpes, HIV, and HPV are viral illnesses that have no cure. They can result in disability, cancer, and death. Sexually active women aged 25 and younger should be checked for chlamydia. Older women with new or multiple partners should also be tested for chlamydia. Testing for other STIs is recommended if you are sexually active and at increased risk.  Osteoporosis is a disease in which the bones lose minerals and strength with aging. This can result in serious bone fractures. The risk of osteoporosis can be identified using a bone density scan. Women ages 65 and over and women at risk for fractures or osteoporosis should discuss screening with their caregivers. Ask your caregiver whether you should take a calcium supplement or vitamin D to reduce the rate of osteoporosis.  Menopause can be associated with physical symptoms and risks. Hormone replacement therapy is available to decrease symptoms and risks. You should talk to your caregiver about whether hormone replacement therapy is right for you.  Use sunscreen with sun protection factor (SPF) of 30 or more. Apply sunscreen liberally and repeatedly throughout the day. You should seek shade when your shadow is shorter than you. Protect yourself by wearing long sleeves, pants, a wide-brimmed hat, and sunglasses year round, whenever you are outdoors.  Once a month, do a whole body skin exam, using a mirror to look at the skin on your back. Notify your caregiver of new moles, moles that have irregular borders, moles that are larger than a pencil eraser, or moles that have changed in shape or color.  Stay current with required immunizations.  Influenza. You need a dose every fall (or winter). The composition of the flu vaccine  changes each year, so being vaccinated once is not enough.  Pneumococcal polysaccharide. You need 1 to 2 doses if you smoke cigarettes or if you have certain chronic medical conditions. You need 1 dose at age 65 (or older) if you have never been vaccinated.  Tetanus, diphtheria, pertussis (Tdap, Td). Get 1 dose of Tdap vaccine if you are younger than age 65, are over 65 and have contact with an infant, are a healthcare worker, are pregnant, or simply want to be protected from whooping cough. After that, you need a Td   booster dose every 10 years. Consult your caregiver if you have not had at least 3 tetanus and diphtheria-containing shots sometime in your life or have a deep or dirty wound.  HPV. You need this vaccine if you are a woman age 26 or younger. The vaccine is given in 3 doses over 6 months.  Measles, mumps, rubella (MMR). You need at least 1 dose of MMR if you were born in 1957 or later. You may also need a second dose.  Meningococcal. If you are age 19 to 21 and a first-year college student living in a residence hall, or have one of several medical conditions, you need to get vaccinated against meningococcal disease. You may also need additional booster doses.  Zoster (shingles). If you are age 60 or older, you should get this vaccine.  Varicella (chickenpox). If you have never had chickenpox or you were vaccinated but received only 1 dose, talk to your caregiver to find out if you need this vaccine.  Hepatitis A. You need this vaccine if you have a specific risk factor for hepatitis A virus infection or you simply wish to be protected from this disease. The vaccine is usually given as 2 doses, 6 to 18 months apart.  Hepatitis B. You need this vaccine if you have a specific risk factor for hepatitis B virus infection or you simply wish to be protected from this disease. The vaccine is given in 3 doses, usually over 6 months. Preventive Services / Frequency Ages 19 to 39  Blood  pressure check.** / Every 1 to 2 years.  Lipid and cholesterol check.** / Every 5 years beginning at age 20.  Clinical breast exam.** / Every 3 years for women in their 20s and 30s.  Pap test.** / Every 2 years from ages 21 through 29. Every 3 years starting at age 30 through age 65 or 70 with a history of 3 consecutive normal Pap tests.  HPV screening.** / Every 3 years from ages 30 through ages 65 to 70 with a history of 3 consecutive normal Pap tests.  Hepatitis C blood test.** / For any individual with known risks for hepatitis C.  Skin self-exam. / Monthly.  Influenza immunization.** / Every year.  Pneumococcal polysaccharide immunization.** / 1 to 2 doses if you smoke cigarettes or if you have certain chronic medical conditions.  Tetanus, diphtheria, pertussis (Tdap, Td) immunization. / A one-time dose of Tdap vaccine. After that, you need a Td booster dose every 10 years.  HPV immunization. / 3 doses over 6 months, if you are 26 and younger.  Measles, mumps, rubella (MMR) immunization. / You need at least 1 dose of MMR if you were born in 1957 or later. You may also need a second dose.  Meningococcal immunization. / 1 dose if you are age 19 to 21 and a first-year college student living in a residence hall, or have one of several medical conditions, you need to get vaccinated against meningococcal disease. You may also need additional booster doses.  Varicella immunization.** / Consult your caregiver.  Hepatitis A immunization.** / Consult your caregiver. 2 doses, 6 to 18 months apart.  Hepatitis B immunization.** / Consult your caregiver. 3 doses usually over 6 months. Ages 40 to 64  Blood pressure check.** / Every 1 to 2 years.  Lipid and cholesterol check.** / Every 5 years beginning at age 20.  Clinical breast exam.** / Every year after age 40.  Mammogram.** / Every year beginning at age 40   and continuing for as long as you are in good health. Consult with your  caregiver.  Pap test.** / Every 3 years starting at age 30 through age 65 or 70 with a history of 3 consecutive normal Pap tests.  HPV screening.** / Every 3 years from ages 30 through ages 65 to 70 with a history of 3 consecutive normal Pap tests.  Fecal occult blood test (FOBT) of stool. / Every year beginning at age 50 and continuing until age 75. You may not need to do this test if you get a colonoscopy every 10 years.  Flexible sigmoidoscopy or colonoscopy.** / Every 5 years for a flexible sigmoidoscopy or every 10 years for a colonoscopy beginning at age 50 and continuing until age 75.  Hepatitis C blood test.** / For all people born from 1945 through 1965 and any individual with known risks for hepatitis C.  Skin self-exam. / Monthly.  Influenza immunization.** / Every year.  Pneumococcal polysaccharide immunization.** / 1 to 2 doses if you smoke cigarettes or if you have certain chronic medical conditions.  Tetanus, diphtheria, pertussis (Tdap, Td) immunization.** / A one-time dose of Tdap vaccine. After that, you need a Td booster dose every 10 years.  Measles, mumps, rubella (MMR) immunization. / You need at least 1 dose of MMR if you were born in 1957 or later. You may also need a second dose.  Varicella immunization.** / Consult your caregiver.  Meningococcal immunization.** / Consult your caregiver.  Hepatitis A immunization.** / Consult your caregiver. 2 doses, 6 to 18 months apart.  Hepatitis B immunization.** / Consult your caregiver. 3 doses, usually over 6 months. Ages 65 and over  Blood pressure check.** / Every 1 to 2 years.  Lipid and cholesterol check.** / Every 5 years beginning at age 20.  Clinical breast exam.** / Every year after age 40.  Mammogram.** / Every year beginning at age 40 and continuing for as long as you are in good health. Consult with your caregiver.  Pap test.** / Every 3 years starting at age 30 through age 65 or 70 with a 3  consecutive normal Pap tests. Testing can be stopped between 65 and 70 with 3 consecutive normal Pap tests and no abnormal Pap or HPV tests in the past 10 years.  HPV screening.** / Every 3 years from ages 30 through ages 65 or 70 with a history of 3 consecutive normal Pap tests. Testing can be stopped between 65 and 70 with 3 consecutive normal Pap tests and no abnormal Pap or HPV tests in the past 10 years.  Fecal occult blood test (FOBT) of stool. / Every year beginning at age 50 and continuing until age 75. You may not need to do this test if you get a colonoscopy every 10 years.  Flexible sigmoidoscopy or colonoscopy.** / Every 5 years for a flexible sigmoidoscopy or every 10 years for a colonoscopy beginning at age 50 and continuing until age 75.  Hepatitis C blood test.** / For all people born from 1945 through 1965 and any individual with known risks for hepatitis C.  Osteoporosis screening.** / A one-time screening for women ages 65 and over and women at risk for fractures or osteoporosis.  Skin self-exam. / Monthly.  Influenza immunization.** / Every year.  Pneumococcal polysaccharide immunization.** / 1 dose at age 65 (or older) if you have never been vaccinated.  Tetanus, diphtheria, pertussis (Tdap, Td) immunization. / A one-time dose of Tdap vaccine if you are over   65 and have contact with an infant, are a healthcare worker, or simply want to be protected from whooping cough. After that, you need a Td booster dose every 10 years.  Varicella immunization.** / Consult your caregiver.  Meningococcal immunization.** / Consult your caregiver.  Hepatitis A immunization.** / Consult your caregiver. 2 doses, 6 to 18 months apart.  Hepatitis B immunization.** / Check with your caregiver. 3 doses, usually over 6 months. ** Family history and personal history of risk and conditions may change your caregiver's recommendations. Document Released: 10/31/2001 Document Revised: 11/27/2011  Document Reviewed: 01/30/2011 [employer] Patient Information 2013 [employer].  

## 2012-12-30 MED ORDER — FENOFIBRATE 160 MG PO TABS: 160.0000 mg | ORAL_TABLET | Freq: Every day | ORAL | Status: DC

## 2013-01-08 ENCOUNTER — Institutional Professional Consult (permissible substitution): Payer: Medicare Other | Admitting: Pulmonary Disease

## 2013-01-14 ENCOUNTER — Ambulatory Visit (INDEPENDENT_AMBULATORY_CARE_PROVIDER_SITE_OTHER): Payer: Medicare Other | Admitting: Pulmonary Disease

## 2013-01-14 ENCOUNTER — Encounter: Payer: Self-pay | Admitting: Pulmonary Disease

## 2013-01-14 VITALS — BP 134/76 | HR 60 | Temp 99.1°F | Ht 65.0 in | Wt 191.6 lb

## 2013-01-14 DIAGNOSIS — G4733 Obstructive sleep apnea (adult) (pediatric): Secondary | ICD-10-CM

## 2013-01-14 NOTE — Progress Notes (Signed)
Subjective:    Patient ID: Wendy Christensen, female    DOB: October 20, 1945, 67 y.o.   MRN: [id number]  HPI 67 year old woman who is referred for evaluation of loud snoring noted by her husband and excessive daytime somnolence.  She reports restless sleep and tiredness in the daytime. Husband has not witnessed apneas but has noted loud snoring Epworth sleepiness score is 9/24, port sleepiness as a passenger in a car or while sitting and reading Bedtime is 10 PM, sleep latency is about 30 minutes she sleeps on her side with 3 pillows his symptoms of heartburn and reflux, she is 2 nocturnal awakenings including nocturia without any post void sleep latency, she is out of bed by 8:30 AM feeling tired with occasional dryness of mouth but denies headaches. She is gained about 10 pounds which he attributes to the Remeron. She did have a sleep study done in May 2005 but this is not traceable at the time of this dictation There is no history suggestive of cataplexy, sleep paralysis or parasomnias  She reports a past medical history of asthma and has been well controlled, she denies use of saba or nocturnal wheezing requiring albuterol usage, symptoms seem to be mild intermittent and triggered by allergies  Past Medical History  Diagnosis Date  . GERD (gastroesophageal reflux disease)   . Hypertension   . Hyperlipidemia   . Anxiety   . Allergic rhinitis   . Anemia   . Depression   . Thyroid goiter   . Asthma     No past surgical history on file.  No Known Allergies  History   Social History  . Marital Status: Married    Spouse Name: N/A    Number of Children: N/A  . Years of Education: N/A   Occupational History  . Retired    Social History Main Topics  . Smoking status: Never Smoker   . Smokeless tobacco: Never Used  . Alcohol Use: No  . Drug Use: No  . Sexually Active: Yes -- Female partner(s)     Comment: none   Other Topics Concern  . 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 Hypertension Mother   . COPD Father   . Hyperlipidemia    . Hypertension    . Asthma Father   . Heart disease Mother   . Kidney cancer Maternal Aunt       Review of Systems  Constitutional: Positive for unexpected weight change. Negative for appetite change.  HENT: Positive for congestion and sneezing. Negative for ear pain, sore throat, trouble swallowing and dental problem.   Respiratory: Positive for shortness of breath. Negative for cough.   Cardiovascular: Negative for chest pain, palpitations and leg swelling.  Gastrointestinal: Negative for abdominal pain.  Musculoskeletal: Negative for joint swelling.  Skin: Negative for rash.  Neurological: Negative for headaches.  Psychiatric/Behavioral: The patient is nervous/anxious.        Objective:   Physical Exam  Gen. Pleasant, well-nourished, in no distress, normal affect ENT - no lesions, no post nasal drip Neck: No JVD, no thyromegaly, no carotid bruits Lungs: no use of accessory muscles, no dullness to percussion, clear without rales or rhonchi  Cardiovascular: Rhythm regular, heart sounds  normal, no murmurs or gallops, no peripheral edema Abdomen: soft and non-tender, no hepatosplenomegaly, BS normal. Musculoskeletal: No deformities, no cyanosis or clubbing Neuro:  alert, non  focal       Assessment & Plan:

## 2013-01-14 NOTE — Patient Instructions (Signed)
Home sleep study  we will call you with results

## 2013-01-14 NOTE — Assessment & Plan Note (Addendum)
Given excessive daytime somnolence, narrow pharyngeal exam, witnessed apneas & loud snoring, obstructive sleep apnea is very likely & an overnight polysomnogram will be scheduled as a split study. The pathophysiology of obstructive sleep apnea , it's cardiovascular consequences & modes of treatment including CPAP were discused with the patient in detail & they evidenced understanding.  Given her anxiety about having a sleep study done in the lab, it would be reasonable to pursue a Home sleep study

## 2013-01-27 ENCOUNTER — Ambulatory Visit (INDEPENDENT_AMBULATORY_CARE_PROVIDER_SITE_OTHER): Payer: Medicare Other

## 2013-01-27 DIAGNOSIS — G4733 Obstructive sleep apnea (adult) (pediatric): Secondary | ICD-10-CM

## 2013-01-31 ENCOUNTER — Emergency Department (HOSPITAL_COMMUNITY)
Admission: EM | Admit: 2013-01-31 | Discharge: 2013-02-01 | Disposition: A | Discharge status: Home / Self Care | Payer: Medicare Other | Attending: Emergency Medicine | Admitting: Emergency Medicine

## 2013-01-31 ENCOUNTER — Emergency Department (HOSPITAL_COMMUNITY): Payer: Medicare Other

## 2013-01-31 ENCOUNTER — Encounter (HOSPITAL_COMMUNITY): Payer: Self-pay | Admitting: Emergency Medicine

## 2013-01-31 DIAGNOSIS — K219 Gastro-esophageal reflux disease without esophagitis: Secondary | ICD-10-CM | POA: Insufficient documentation

## 2013-01-31 DIAGNOSIS — E785 Hyperlipidemia, unspecified: Secondary | ICD-10-CM | POA: Insufficient documentation

## 2013-01-31 DIAGNOSIS — Z79899 Other long term (current) drug therapy: Secondary | ICD-10-CM | POA: Insufficient documentation

## 2013-01-31 DIAGNOSIS — J45909 Unspecified asthma, uncomplicated: Secondary | ICD-10-CM | POA: Insufficient documentation

## 2013-01-31 DIAGNOSIS — R197 Diarrhea, unspecified: Secondary | ICD-10-CM | POA: Insufficient documentation

## 2013-01-31 DIAGNOSIS — Z7982 Long term (current) use of aspirin: Secondary | ICD-10-CM | POA: Insufficient documentation

## 2013-01-31 DIAGNOSIS — Z862 Personal history of diseases of the blood and blood-forming organs and certain disorders involving the immune mechanism: Secondary | ICD-10-CM | POA: Insufficient documentation

## 2013-01-31 DIAGNOSIS — R112 Nausea with vomiting, unspecified: Secondary | ICD-10-CM | POA: Insufficient documentation

## 2013-01-31 DIAGNOSIS — A088 Other specified intestinal infections: Secondary | ICD-10-CM | POA: Insufficient documentation

## 2013-01-31 DIAGNOSIS — I10 Essential (primary) hypertension: Secondary | ICD-10-CM | POA: Insufficient documentation

## 2013-01-31 DIAGNOSIS — F3289 Other specified depressive episodes: Secondary | ICD-10-CM | POA: Insufficient documentation

## 2013-01-31 DIAGNOSIS — F411 Generalized anxiety disorder: Secondary | ICD-10-CM | POA: Insufficient documentation

## 2013-01-31 DIAGNOSIS — A084 Viral intestinal infection, unspecified: Secondary | ICD-10-CM

## 2013-01-31 DIAGNOSIS — F329 Major depressive disorder, single episode, unspecified: Secondary | ICD-10-CM | POA: Insufficient documentation

## 2013-01-31 LAB — CBC WITH DIFFERENTIAL/PLATELET
Basophils Relative: 0 % (ref 0–1)
Hemoglobin: 12.6 g/dL (ref 12.0–15.0)
Lymphs Abs: 0.5 10*3/uL — ABNORMAL LOW (ref 0.7–4.0)
Monocytes Relative: 4 % (ref 3–12)
Neutro Abs: 8.7 10*3/uL — ABNORMAL HIGH (ref 1.7–7.7)
Neutrophils Relative %: 90 % — ABNORMAL HIGH (ref 43–77)
RBC: 4.54 MIL/uL (ref 3.87–5.11)
WBC: 9.7 10*3/uL (ref 4.0–10.5)

## 2013-01-31 LAB — POCT I-STAT TROPONIN I
Troponin i, poc: 0 ng/mL (ref 0.00–0.08)
Troponin i, poc: 0 ng/mL (ref 0.00–0.08)

## 2013-01-31 LAB — URINE MICROSCOPIC-ADD ON

## 2013-01-31 LAB — URINALYSIS, ROUTINE W REFLEX MICROSCOPIC
Glucose, UA: NEGATIVE mg/dL
Hgb urine dipstick: NEGATIVE
Ketones, ur: NEGATIVE mg/dL
Nitrite: NEGATIVE
pH: 7 (ref 5.0–8.0)

## 2013-01-31 LAB — LIPASE, BLOOD: Lipase: 27 U/L (ref 11–59)

## 2013-01-31 LAB — COMPREHENSIVE METABOLIC PANEL
Albumin: 3.9 g/dL (ref 3.5–5.2)
Alkaline Phosphatase: 60 U/L (ref 39–117)
BUN: 15 mg/dL (ref 6–23)
Chloride: 102 mEq/L (ref 96–112)
Potassium: 3.4 mEq/L — ABNORMAL LOW (ref 3.5–5.1)
Total Bilirubin: 0.4 mg/dL (ref 0.3–1.2)

## 2013-01-31 MED ORDER — SODIUM CHLORIDE 0.9 % IV BOLUS (SEPSIS)
1000.0000 mL | Freq: Once | INTRAVENOUS | Status: AC
  Administered 2013-01-31: 1000 mL via INTRAVENOUS

## 2013-01-31 MED ORDER — ONDANSETRON HCL 4 MG/2ML IJ SOLN
4.0000 mg | Freq: Once | INTRAMUSCULAR | Status: AC
  Administered 2013-01-31: 4 mg via INTRAVENOUS
  Filled 2013-01-31: qty 2

## 2013-01-31 NOTE — ED Notes (Addendum)
Pt brought to ED via [facility] for evaluation of chest pain that started around 0600am today.  Pain radiates to back and both arms, denies pain being worse with inspiration.  Pt initially hypertensive for EMS 160/100, 2 nitro given for 10/10 pain- BP 110/60, pain 2/10.  Denies any cardiac hx, sinus tach on monitor 115bpm, IV in place.

## 2013-01-31 NOTE — ED Provider Notes (Signed)
History     CSN: [id number]  Arrival date & time 01/31/13  1843   First MD Initiated Contact with Patient 01/31/13 1907      Chief Complaint  Patient presents with  . Chest Pain    (Consider location/radiation/quality/duration/timing/severity/associated sxs/prior treatment) HPI Comments: 67 y.o. female who presents to the ER w/ the cc of nausea and vomiting and diarrhea. Pt states her symptoms started this morning. She has had "too many to count" episodes of nausea, vomiting, and diarrhea. She states that after multiple episodes of vomitus -- she started to experience chest pain. She did not have SOB associated w/ this. She denies abdominal pain associated w/ this. Her vomiting has been nonbloody and nonbilious. Her stools have been nonbloody but watery -- she has not had recent abx use -- she ate seafood yesterday. No one else was sick around her.  Pt states that after multiple episodes of vomitus she developed abdominal pain -- resolved with nitro that was given to her en route.   Patient is a 67 y.o. female presenting with general illness. The history is provided by the patient.  Illness  The current episode started today. The onset was gradual. The problem occurs rarely. The problem has been unchanged. The problem is mild. Nothing relieves the symptoms. Nothing aggravates the symptoms. Associated symptoms include diarrhea, nausea and vomiting. Pertinent negatives include no fever, no eye itching, no abdominal pain, no headaches, no stridor, no neck pain, no cough, no wheezing, no rash, no eye discharge and no eye pain.    Past Medical History  Diagnosis Date  . GERD (gastroesophageal reflux disease)   . Hypertension   . Hyperlipidemia   . Anxiety   . Allergic rhinitis   . Anemia   . Depression   . Thyroid goiter   . Asthma     History reviewed. No pertinent past surgical history.  Family History  Problem Relation Age of Onset  . Hypertension Mother   . COPD Father   .  Hyperlipidemia    . Hypertension    . Asthma Father   . Heart disease Mother   . Kidney cancer Maternal Aunt     History  Substance Use Topics  . Smoking status: Never Smoker   . Smokeless tobacco: Never Used  . Alcohol Use: No    OB History   Grav Para Term Preterm Abortions TAB SAB Ect Mult Living                  Review of Systems  Constitutional: Negative for fever, chills and fatigue.  HENT: Negative for facial swelling, drooling, neck pain and dental problem.   Eyes: Negative for pain, discharge and itching.  Respiratory: Negative for cough, choking, wheezing and stridor.   Cardiovascular: Negative for chest pain.  Gastrointestinal: Positive for nausea, vomiting and diarrhea. Negative for abdominal pain.  Endocrine: Negative for cold intolerance and heat intolerance.  Genitourinary: Negative for vaginal discharge, difficulty urinating and vaginal pain.  Skin: Negative for pallor and rash.  Neurological: Positive for weakness. Negative for dizziness, light-headedness and headaches.  Psychiatric/Behavioral: Negative for behavioral problems and agitation.    Allergies  Review of patient's allergies indicates no known allergies.  Home Medications   Current Outpatient Rx  Name  Route  Sig  Dispense  Refill  . aspirin 81 MG tablet   Oral   Take 81 mg by mouth daily.         Marland Kitchen atorvastatin (LIPITOR) 40 MG tablet  1 tab by mouth daily   90 tablet   1   . citalopram (CELEXA) 40 MG tablet   Oral   Take 40 mg by mouth daily.         . fenofibrate 160 MG tablet   Oral   Take 1 tablet (160 mg total) by mouth daily.   30 tablet   2   . KLOR-CON 10 10 MEQ tablet      TAKE 1 TABLET BY MOUTH TWICE DAILY   60 tablet   0   . losartan-hydrochlorothiazide (HYZAAR) 100-12.5 MG per tablet   Oral   Take 1 tablet by mouth daily.   90 tablet   1     D/c lisinopril hct   . metoprolol (LOPRESSOR) 100 MG tablet      TAKE 1 TABLET BY MOUTH TWICE DAILY    180 tablet   1   . Multiple Vitamins-Minerals (MULTIVITAMIN WITH MINERALS) tablet   Oral   Take 1 tablet by mouth daily.         Maxwell Caul Bicarbonate (ZEGERID OTC) 20-1100 MG CAPS   Oral   Take 1 capsule by mouth daily before breakfast.         . aspirin 325 MG buffered tablet   Oral   Take 325 mg by mouth daily as needed for pain.           BP 117/68  Pulse 120  Temp(Src) 100 F (37.8 C) (Oral)  Resp 18  SpO2 97%  Physical Exam  Constitutional: She is oriented to person, place, and time. She appears well-developed. No distress.  HENT:  Head: Normocephalic and atraumatic.  Eyes: Pupils are equal, round, and reactive to light. Right eye exhibits no discharge. Left eye exhibits no discharge.  Neck: Neck supple. No tracheal deviation present.  Cardiovascular: Normal rate.  Exam reveals no gallop and no friction rub.   Pulmonary/Chest: No stridor. No respiratory distress. She has no wheezes.  Abdominal: Soft. She exhibits no distension. There is no tenderness. There is no rebound.  Musculoskeletal: She exhibits no edema and no tenderness.  Neurological: She is alert and oriented to person, place, and time.  Skin: Skin is warm. She is not diaphoretic.  Mucous membranes are slightly dry    ED Course  Procedures (including critical care time)  Labs Reviewed  CBC WITH DIFFERENTIAL - Abnormal; Notable for the following:    Neutrophils Relative % 90 (*)    Neutro Abs 8.7 (*)    Lymphocytes Relative 5 (*)    Lymphs Abs 0.5 (*)    All other components within normal limits  COMPREHENSIVE METABOLIC PANEL - Abnormal; Notable for the following:    Potassium 3.4 (*)    Glucose, Bld 125 (*)    GFR calc non Af Amer 54 (*)    GFR calc Af Amer 62 (*)    All other components within normal limits  URINALYSIS, ROUTINE W REFLEX MICROSCOPIC - Abnormal; Notable for the following:    Bilirubin Urine SMALL (*)    Protein, ur 30 (*)    All other components within normal  limits  URINE MICROSCOPIC-ADD ON - Abnormal; Notable for the following:    Casts HYALINE CASTS (*)    All other components within normal limits  LIPASE, BLOOD  POCT I-STAT TROPONIN I   No results found.   ECG shows Sinus tachycardia, HR is 120, do not see pathologic ST wave or T wave changes.   MDM  Pt with symptoms consistent with viral gastroenteritis. On exam she has dry mucous membranes, and she appear mildly dehydrated. She does not have abd ttp. She no longer is complaining of chest pain. Pt's chest pain started more than 5 hours ago -- will check trop times 2, if truly ACS would expect enzymes to be elevated -- however, pt's chest pain started after she had multiple episodes of vomiting and diarrhea -- suspect MSK in etiology.   Will give her IV fluids, and hydrate.   After IV fluids pt's symptoms markedly improved -- states she feels she has more energy. Trop neg -- doubt she has ACS.   Trops times 2 negative. Pt feeling much better. She is drinking water at bedside without vomiting. Suspect viral gastro as etiology of symptoms. Repeat vital signs when pt is discharged when I re-evaluate her are WNL -- she is no longer tachycardic.   1. Viral gastroenteritis         Bernadene Person, MD 02/01/13 0001

## 2013-02-01 NOTE — ED Provider Notes (Signed)
I saw and evaluated the patient, reviewed the resident's note and I agree with the findings and plan.  Pt with onset of diarrhea, followed by several episodes of N/v.  Pt reports took care of niece earlier in the week who had been ill.  Patient developed substernal chest pain that was nonradiating afterwards. No significant shortness of breath, diaphoresis. She denies any new medications, foreign travel or recent antibiotic use. She denies having any abdominal pain or back pain. No significant prior history of coronary disease. Abdomen is soft no nausea or vomiting. ECG shows no significant acute ischemia , serial troponins are negative. Patient feels improved, is tolerating by mouth's. Patient and family are comfortable being discharged to home.  I reviewed and agree with ECG interpretation by Dr. Eula Listen.  Impression: N/V/D CP secondary to gastritis   Gavin Pound. Oletta Lamas, MD 02/01/13 1308

## 2013-02-01 NOTE — ED Notes (Signed)
Pt dc to home. Pt sts understanding to dc instructions. Pt ambulatory to exit without difficulty. 

## 2013-02-07 ENCOUNTER — Telehealth: Payer: Self-pay | Admitting: Pulmonary Disease

## 2013-02-07 DIAGNOSIS — G4733 Obstructive sleep apnea (adult) (pediatric): Secondary | ICD-10-CM

## 2013-02-07 NOTE — Telephone Encounter (Signed)
She did stop breathing 18 times/ hr on study Suggest CPAP titration in sleep lab if agreeable

## 2013-02-07 NOTE — Telephone Encounter (Signed)
I called home # was advised I had wrong # Called cell and [id number]

## 2013-02-11 ENCOUNTER — Other Ambulatory Visit: Payer: Self-pay | Admitting: Family Medicine

## 2013-02-11 NOTE — Telephone Encounter (Signed)
Med filled.  

## 2013-02-13 NOTE — Telephone Encounter (Signed)
lmtcb x2 on cell

## 2013-02-14 ENCOUNTER — Encounter: Payer: Self-pay | Admitting: *Deleted

## 2013-02-14 NOTE — Telephone Encounter (Signed)
lmomtcb x3 for pt--will mail letter out to pt and will forward to RA as an FYI that pt has not returned my call

## 2013-02-26 NOTE — Addendum Note (Signed)
Addended by: Tommie Sams on: 02/26/2013 11:19 AM   Modules accepted: Orders

## 2013-02-26 NOTE — Telephone Encounter (Signed)
I spoke with patient about results and she verbalized understanding and had no questions. Order sent 

## 2013-03-23 ENCOUNTER — Ambulatory Visit (HOSPITAL_BASED_OUTPATIENT_CLINIC_OR_DEPARTMENT_OTHER): Payer: Medicare Other

## 2013-03-24 ENCOUNTER — Encounter: Payer: Self-pay | Admitting: Family Medicine

## 2013-03-24 ENCOUNTER — Ambulatory Visit (INDEPENDENT_AMBULATORY_CARE_PROVIDER_SITE_OTHER): Payer: Medicare Other | Admitting: Family Medicine

## 2013-03-24 VITALS — BP 130/78 | HR 64 | Temp 98.3°F | Wt 185.6 lb

## 2013-03-24 DIAGNOSIS — R300 Dysuria: Secondary | ICD-10-CM

## 2013-03-24 DIAGNOSIS — R350 Frequency of micturition: Secondary | ICD-10-CM

## 2013-03-24 DIAGNOSIS — N390 Urinary tract infection, site not specified: Secondary | ICD-10-CM

## 2013-03-24 LAB — POCT URINALYSIS DIPSTICK
Ketones, UA: NEGATIVE
Spec Grav, UA: 1.01
pH, UA: 7

## 2013-03-24 MED ORDER — CIPROFLOXACIN HCL 500 MG PO TABS: 500.0000 mg | ORAL_TABLET | Freq: Two times a day (BID) | ORAL | Status: AC

## 2013-03-24 MED ORDER — PHENAZOPYRIDINE HCL 200 MG PO TABS: 200.0000 mg | ORAL_TABLET | Freq: Three times a day (TID) | ORAL | Status: DC | PRN

## 2013-03-24 NOTE — Progress Notes (Signed)
  Subjective:    Wendy Christensen is a 67 y.o. female who complains of burning with urination, left flank pain, foul smelling urine and suprapubic pressure. She has had symptoms for 1 week. Patient also complains of back pain. Patient denies congestion, cough, fever, headache, rhinitis, sorethroat and vaginal discharge. Patient does not have a history of recurrent UTI. Patient does not have a history of pyelonephritis.   The following portions of the patient's history were reviewed and updated as appropriate: allergies, current medications, past family history, past medical history, past social history, past surgical history and problem list.  Review of Systems Pertinent items are noted in HPI.    Objective:    BP 130/78  Pulse 64  Temp(Src) 98.3 F (36.8 C) (Oral)  Wt 185 lb 9.6 oz (84.188 kg)  BMI 30.89 kg/m2  SpO2 96% General appearance: alert, cooperative and no distress Back: symmetric, no curvature. ROM normal. No CVA tenderness. Abdomen: soft, non-tender; bowel sounds normal; no masses,  no organomegaly  Laboratory:  Urine dipstick: 3+ for hemoglobin, 3+ for leukocyte esterase and positive for nitrites.   Micro exam: not done.    Assessment:    Acute cystitis and UTI     Plan:    Medications: ciprofloxacin. Maintain adequate hydration. Follow up if symptoms not improving, and as needed.

## 2013-03-24 NOTE — Patient Instructions (Signed)
Urinary Tract Infection  Urinary tract infections (UTIs) can develop anywhere along your urinary tract. Your urinary tract is your body's drainage system for removing wastes and extra water. Your urinary tract includes two kidneys, two ureters, a bladder, and a urethra. Your kidneys are a pair of bean-shaped organs. Each kidney is about the size of your fist. They are located below your ribs, one on each side of your spine.  CAUSES  Infections are caused by microbes, which are microscopic organisms, including fungi, viruses, and bacteria. These organisms are so small that they can only be seen through a microscope. Bacteria are the microbes that most commonly cause UTIs.  SYMPTOMS   Symptoms of UTIs may vary by age and gender of the patient and by the location of the infection. Symptoms in young women typically include a frequent and intense urge to urinate and a painful, burning feeling in the bladder or urethra during urination. Older women and men are more likely to be tired, shaky, and weak and have muscle aches and abdominal pain. A fever may mean the infection is in your kidneys. Other symptoms of a kidney infection include pain in your back or sides below the ribs, nausea, and vomiting.  DIAGNOSIS  To diagnose a UTI, your caregiver will ask you about your symptoms. Your caregiver also will ask to provide a urine sample. The urine sample will be tested for bacteria and white blood cells. White blood cells are made by your body to help fight infection.  TREATMENT   Typically, UTIs can be treated with medication. Because most UTIs are caused by a bacterial infection, they usually can be treated with the use of antibiotics. The choice of antibiotic and length of treatment depend on your symptoms and the type of bacteria causing your infection.  HOME CARE INSTRUCTIONS   If you were prescribed antibiotics, take them exactly as your caregiver instructs you. Finish the medication even if you feel better after you  have only taken some of the medication.   Drink enough water and fluids to keep your urine clear or pale yellow.   Avoid caffeine, tea, and carbonated beverages. They tend to irritate your bladder.   Empty your bladder often. Avoid holding urine for long periods of time.   Empty your bladder before and after sexual intercourse.   After a bowel movement, women should cleanse from front to back. Use each tissue only once.  SEEK MEDICAL CARE IF:    You have back pain.   You develop a fever.   Your symptoms do not begin to resolve within 3 days.  SEEK IMMEDIATE MEDICAL CARE IF:    You have severe back pain or lower abdominal pain.   You develop chills.   You have nausea or vomiting.   You have continued burning or discomfort with urination.  MAKE SURE YOU:    Understand these instructions.   Will watch your condition.   Will get help right away if you are not doing well or get worse.  Document Released: 06/14/2005 Document Revised: 03/05/2012 Document Reviewed: 10/13/2011  [employer] Patient Information 2014 [employer]

## 2013-03-26 LAB — URINE CULTURE

## 2013-04-03 ENCOUNTER — Other Ambulatory Visit: Payer: Self-pay | Admitting: Family Medicine

## 2013-04-09 ENCOUNTER — Other Ambulatory Visit: Payer: Self-pay | Admitting: Family Medicine

## 2013-04-16 ENCOUNTER — Ambulatory Visit (HOSPITAL_BASED_OUTPATIENT_CLINIC_OR_DEPARTMENT_OTHER): Payer: Medicare Other | Attending: Pulmonary Disease

## 2013-04-29 ENCOUNTER — Other Ambulatory Visit: Payer: Self-pay

## 2013-04-29 DIAGNOSIS — Z1231 Encounter for screening mammogram for malignant neoplasm of breast: Secondary | ICD-10-CM

## 2013-05-14 ENCOUNTER — Ambulatory Visit
Admission: RE | Admit: 2013-05-14 | Discharge: 2013-05-14 | Disposition: A | Discharge status: Home / Self Care | Payer: Medicare Other | Source: Ambulatory Visit

## 2013-05-14 DIAGNOSIS — Z1231 Encounter for screening mammogram for malignant neoplasm of breast: Secondary | ICD-10-CM

## 2013-06-11 ENCOUNTER — Ambulatory Visit: Payer: Medicare Other | Admitting: Endocrinology

## 2013-06-17 ENCOUNTER — Encounter: Payer: Self-pay | Admitting: Endocrinology

## 2013-06-17 ENCOUNTER — Ambulatory Visit (INDEPENDENT_AMBULATORY_CARE_PROVIDER_SITE_OTHER): Payer: Medicare Other | Admitting: Endocrinology

## 2013-06-17 ENCOUNTER — Ambulatory Visit: Payer: Medicare Other | Admitting: Family Medicine

## 2013-06-17 VITALS — BP 134/80 | HR 69 | Ht 65.0 in | Wt 187.0 lb

## 2013-06-17 DIAGNOSIS — E039 Hypothyroidism, unspecified: Secondary | ICD-10-CM | POA: Insufficient documentation

## 2013-06-17 LAB — TSH: TSH: 1.41 u[IU]/mL (ref 0.35–5.50)

## 2013-06-17 NOTE — Progress Notes (Signed)
Subjective:    Patient ID: Wendy Christensen, female    DOB: 07/18/1946, 67 y.o.   MRN: [id number]  HPI Pt states few years of moderate heat intolerance throughout the body, and assoc depression.  She was noted to have elevated TSH, and was rx'ed synthroid 25/day.  However, she took only a few pills, as she says she is not sure she needs it.    Past Medical History  Diagnosis Date  . GERD (gastroesophageal reflux disease)   . Hypertension   . Hyperlipidemia   . Anxiety   . Allergic rhinitis   . Anemia   . Depression   . Thyroid goiter   . Asthma     No past surgical history on file.  History   Social History  . Marital Status: Married    Spouse Name: N/A    Number of Children: N/A  . Years of Education: N/A   Occupational History  . Retired    Social History Main Topics  . Smoking status: Never Smoker   . Smokeless tobacco: Never Used  . Alcohol Use: No  . Drug Use: No  . Sexual Activity: Yes    Partners: Male     Comment: none   Other Topics Concern  . 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 aspirin 81 MG tablet Take 81 mg by mouth daily.      Marland Kitchen atorvastatin (LIPITOR) 40 MG tablet 1 tab by mouth daily  90 tablet  1  . citalopram (CELEXA) 40 MG tablet Take 40 mg by mouth daily.      . fenofibrate 160 MG tablet 1 tab by mouth daily--repeat labs are due now  30 tablet  0  . KLOR-CON 10 10 MEQ tablet TAKE 1 TABLET BY MOUTH TWICE DAILY  60 tablet  0  . losartan-hydrochlorothiazide (HYZAAR) 100-12.5 MG per tablet Take 1 tablet by mouth daily.  90 tablet  1  . metoprolol (LOPRESSOR) 100 MG tablet TAKE 1 TABLET BY MOUTH TWICE DAILY  180 tablet  0  . Multiple Vitamins-Minerals (MULTIVITAMIN WITH MINERALS) tablet Take 1 tablet by mouth daily.      Maxwell Caul Bicarbonate (ZEGERID OTC) 20-1100 MG CAPS Take 1 capsule by  mouth daily before breakfast.      . phenazopyridine (PYRIDIUM) 200 MG tablet Take 1 tablet (200 mg total) by mouth 3 (three) times daily as needed for pain.  6 tablet  0   No current facility-administered medications on file prior to visit.   No Known Allergies  Family History  Problem Relation Age of Onset  . Hypertension Mother   . COPD Father   . Hyperlipidemia    . Hypertension    . Asthma Father   . Heart disease Mother   . Kidney cancer Maternal Aunt   mother has an uncertain type of thyroid problem  BP 134/80  Pulse 69  Ht 5\' 5"  (1.651 m)  Wt 187 lb (84.823 kg)  BMI 31.12 kg/m2  SpO2 94%  Review of Systems She reports insomnia, weight gain, hair loss, leg cramps, dry skin, and heartburn.  denies sob, slight blurry vision, constipation, numbness, rhinorrhea, easy bruising, and syncope     Objective:   Physical Exam VS: see vs page GEN: no distress HEAD: head: no deformity eyes: no periorbital swelling, no proptosis external nose and ears  are normal mouth: no lesion seen NECK: supple, thyroid is not enlarged CHEST WALL: no deformity LUNGS:  Clear to auscultation CV: reg rate and rhythm, no murmur ABD: abdomen is soft, nontender.  no hepatosplenomegaly.  not distended.  no hernia. MUSCULOSKELETAL: muscle bulk and strength are grossly normal.  no obvious joint swelling.  gait is normal and steady EXTEMITIES: no deformity.  no ulcer on the feet.  feet are of normal color and temp.  no edema PULSES: dorsalis pedis intact bilat.  no carotid bruit NEURO:  cn 2-12 grossly intact.   readily moves all 4's.  sensation is intact to touch on the feet SKIN:  Normal texture and temperature.  No rash or suspicious lesion is visible.   NODES:  None palpable at the neck PSYCH: alert, oriented x3.  Does not appear anxious nor depressed.  Lab Results  Component Value Date   TSH 1.41 06/17/2013      Assessment & Plan:  Hypothyroidism, mild.  In view of these normal TFT, she  can hold-off on synthroid for now.  However, she has a high risk of needing it in the future. Depression: this limits interpretation of sxs. Weight gain: not thyroid-related.

## 2013-06-17 NOTE — Patient Instructions (Addendum)
blood tests are being requested for you today.  We'll contact you with results. Based on the results, you may need to take the thyroid pill.  i'll let you know.       Hypothyroidism Hypothyroidism is a condition due to under activity of the thyroid gland. CAUSES  Hypothyroidism may be due to thyroid surgery or disease.  SYMPTOMS  Symptoms are often vague. They may include:  Fatigue.  Mental dullness.  Weakness.  Hoarseness.  Constipation.  Swelling in the face, hands or feet.  Dry skin.  Hair loss.  Sensitivity to cold.  Menstrual problems.  Patients with hypothyroidism are also more likely to have problems with infections. DIAGNOSIS  The diagnosis is confirmed by lab tests for levels of thyroid hormones (TSH, T3, and T4). These tests are also used to monitor treatment. TREATMENT  Treatment includes the gradual replacement of thyroid hormones. It is very important that you take your thyroid medicine as prescribed daily, even after you begin to feel better. You will probably require thyroid medicine for the rest of your life. To reduce constipation, eat a diet high in fruits and vegetables. Try to get some exercise every day. Please see your doctor for follow up care as recommended so your treatment can be adjusted as your condition improves.  SEEK IMMEDIATE MEDICAL CARE IF:  You have chest pain, palpitations, shortness of breath, or any other serious symptoms. Document Released: 10/12/2004 Document Revised: 11/27/2011 Document Reviewed: 09/04/2005 [facility] Patient Information 2014 [place], [place].

## 2013-06-19 ENCOUNTER — Ambulatory Visit (INDEPENDENT_AMBULATORY_CARE_PROVIDER_SITE_OTHER): Payer: Medicare Other | Admitting: Family Medicine

## 2013-06-19 ENCOUNTER — Encounter: Payer: Self-pay | Admitting: Family Medicine

## 2013-06-19 VITALS — BP 122/78 | HR 61 | Temp 98.7°F | Wt 186.8 lb

## 2013-06-19 DIAGNOSIS — E785 Hyperlipidemia, unspecified: Secondary | ICD-10-CM

## 2013-06-19 DIAGNOSIS — I10 Essential (primary) hypertension: Secondary | ICD-10-CM

## 2013-06-19 DIAGNOSIS — R7309 Other abnormal glucose: Secondary | ICD-10-CM

## 2013-06-19 DIAGNOSIS — R739 Hyperglycemia, unspecified: Secondary | ICD-10-CM

## 2013-06-19 DIAGNOSIS — E781 Pure hyperglyceridemia: Secondary | ICD-10-CM

## 2013-06-19 LAB — BASIC METABOLIC PANEL
Calcium: 8.9 mg/dL (ref 8.4–10.5)
GFR: 65.36 mL/min (ref >60.00)
Potassium: 3.3 mEq/L — ABNORMAL LOW (ref 3.5–5.1)
Sodium: 135 mEq/L (ref 135–145)

## 2013-06-19 LAB — HEPATIC FUNCTION PANEL
AST: 21 U/L (ref 0–37)
Alkaline Phosphatase: 72 U/L (ref 39–117)
Total Bilirubin: 0.4 mg/dL (ref 0.3–1.2)

## 2013-06-19 LAB — LIPID PANEL
Cholesterol: 160 mg/dL (ref 0–200)
HDL: 40.1 mg/dL (ref >39.00)
LDL Cholesterol: 91 mg/dL (ref 0–99)
VLDL: 29 mg/dL (ref 0.0–40.0)

## 2013-06-19 LAB — HEMOGLOBIN A1C: Hgb A1c MFr Bld: 6.4 % (ref 4.6–6.5)

## 2013-06-19 MED ORDER — FENOFIBRATE 160 MG PO TABS: ORAL_TABLET | ORAL | Status: DC

## 2013-06-19 MED ORDER — METOPROLOL TARTRATE 100 MG PO TABS: ORAL_TABLET | ORAL | Status: DC

## 2013-06-19 NOTE — Assessment & Plan Note (Signed)
Check labs today.

## 2013-06-19 NOTE — Progress Notes (Signed)
  Subjective:    Patient here for follow-up of elevated blood pressure.  She is exercising and is adherent to a low-salt diet.  Blood pressure is well controlled at home. Cardiac symptoms: none. Patient denies: chest pain, chest pressure/discomfort, claudication, dyspnea, exertional chest pressure/discomfort, fatigue, irregular heart beat, lower extremity edema, near-syncope, orthopnea, palpitations, paroxysmal nocturnal dyspnea, syncope and tachypnea. Cardiovascular risk factors: advanced age (older than 24 for men, 91 for women), dyslipidemia, hypertension and obesity (BMI >= 30 kg/m2). Use of agents associated with hypertension: none. History of target organ damage: none.  Pt also here to check lipids.    The following portions of the patient's history were reviewed and updated as appropriate: allergies, current medications, past family history, past medical history, past social history, past surgical history and problem list.  Review of Systems Pertinent items are noted in HPI.     Objective:    BP 122/78  Pulse 61  Temp(Src) 98.7 F (37.1 C) (Oral)  Wt 186 lb 12.8 oz (84.732 kg)  BMI 31.09 kg/m2  SpO2 98% General appearance: alert, cooperative, appears stated age and no distress Neck: no adenopathy, no carotid bruit, no JVD and supple, symmetrical, trachea midline Lungs: clear to auscultation bilaterally Heart: S1, S2 normal Extremities: extremities normal, atraumatic, no cyanosis or edema    Assessment:    Hypertension, normal blood pressure . Evidence of target organ damage: none.    Plan:    Medication: no change. Dietary sodium restriction. Regular aerobic exercise. Check blood pressures 2-3 times weekly and record. Follow up: 3 months and as needed.

## 2013-06-19 NOTE — Patient Instructions (Addendum)

## 2013-06-19 NOTE — Assessment & Plan Note (Signed)
Check labs con't meds 

## 2013-06-25 ENCOUNTER — Telehealth: Payer: Self-pay

## 2013-06-25 DIAGNOSIS — E876 Hypokalemia: Secondary | ICD-10-CM

## 2013-06-25 MED ORDER — POTASSIUM CHLORIDE CRYS ER 20 MEQ PO TBCR: 20.0000 meq | EXTENDED_RELEASE_TABLET | Freq: Two times a day (BID) | ORAL | Status: DC

## 2013-06-25 NOTE — Telephone Encounter (Signed)
Message copied by Arnette Norris on Wed Jun 25, 2013  4:13 PM ------      Message from: Lelon Perla      Created: Mon Jun 23, 2013  8:17 PM       K is low---  kcl 10 meq (we can change to 20 meq if easier)  20 meq bid       Rest of labs are normal      Recheck 1 months   Dx hypokalemia---bmp ------

## 2013-06-25 NOTE — Telephone Encounter (Signed)
Patient aware and voiced understanding.  Order entered and Rx sent     KP

## 2013-07-27 ENCOUNTER — Other Ambulatory Visit: Payer: Self-pay | Admitting: Family Medicine

## 2013-07-31 ENCOUNTER — Encounter: Payer: Self-pay | Admitting: Nurse Practitioner

## 2013-07-31 ENCOUNTER — Ambulatory Visit (INDEPENDENT_AMBULATORY_CARE_PROVIDER_SITE_OTHER): Payer: Medicare Other | Admitting: Nurse Practitioner

## 2013-07-31 VITALS — BP 102/64 | HR 78 | Temp 97.9°F | Ht 65.0 in | Wt 186.0 lb

## 2013-07-31 DIAGNOSIS — J069 Acute upper respiratory infection, unspecified: Secondary | ICD-10-CM

## 2013-07-31 DIAGNOSIS — J029 Acute pharyngitis, unspecified: Secondary | ICD-10-CM

## 2013-07-31 MED ORDER — ALBUTEROL SULFATE HFA 108 (90 BASE) MCG/ACT IN AERS: 2.0000 | INHALATION_SPRAY | RESPIRATORY_TRACT | Status: DC | PRN

## 2013-07-31 NOTE — Progress Notes (Signed)
Pre-visit discussion using our clinic review tool. No additional management support is needed unless otherwise documented below in the visit note.  

## 2013-07-31 NOTE — Progress Notes (Signed)
  Subjective:    Patient ID: Wendy Christensen, female    DOB: [birth year], 67 y.o.   MRN: [id number]  Sore Throat  This is a new problem. The current episode started in the past 7 days (3d). The problem has been gradually worsening. The pain is worse on the left side. There has been no fever. The patient is experiencing no pain. Associated symptoms include congestion, coughing, ear pain and headaches. Pertinent negatives include no abdominal pain or diarrhea. She has tried nothing for the symptoms.      Review of Systems  Constitutional: Positive for chills. Negative for fever, activity change, appetite change and fatigue.  HENT: Positive for congestion, ear pain, postnasal drip and sore throat.   Eyes: Negative for redness.  Respiratory: Positive for cough and wheezing. Negative for chest tightness.   Cardiovascular: Negative for chest pain.  Gastrointestinal: Negative for nausea, abdominal pain and diarrhea.  Musculoskeletal: Negative for back pain.  Skin: Negative for rash.  Neurological: Positive for headaches.  Hematological: Negative for adenopathy.       Objective:   Physical Exam  Constitutional: She is oriented to person, place, and time. She appears well-developed and well-nourished. No distress.  HENT:  Head: Normocephalic and atraumatic.  Right Ear: External ear normal. No middle ear effusion.  Left Ear: External ear normal. Tympanic membrane is not injected, not erythematous and not retracted. A middle ear effusion is present.  Mouth/Throat: Posterior oropharyngeal erythema (petechiae soft palate) present.  Eyes: Conjunctivae are normal. Right eye exhibits no discharge. Left eye exhibits no discharge.  Neck: Normal range of motion. Neck supple. No thyromegaly present.  Cardiovascular: Normal rate, regular rhythm and normal heart sounds.   No murmur heard. Pulmonary/Chest: Effort normal and breath sounds normal. No respiratory distress. She has no wheezes.   Lymphadenopathy:    She has no cervical adenopathy.  Neurological: She is alert and oriented to person, place, and time.  Skin: Skin is warm and dry.  Psychiatric: She has a normal mood and affect. Her behavior is normal. Thought content normal.          Assessment & Plan:  1. Viral upper respiratory illness 3 dys nasal congestion & cough, sore throat. Asthma flare in past w/viral illness. - albuterol (PROVENTIL HFA;VENTOLIN HFA) 108 (90 BASE) MCG/ACT inhaler; Inhale 2 puffs into the lungs every 4 (four) hours as needed for wheezing.  Dispense: 1 Inhaler; Refill: 0  2. Sore throat Petechiae soft palate. - POCT rapid strep A-neg - Upper Respiratory Culture

## 2013-07-31 NOTE — Patient Instructions (Signed)
I think you have a cold virus. You should be better in 1-2 weeks. I think the inhaler will be useful if your asthma flares as it has in the past. Please let us know if you develop chest pain with breathing or fever-you should be re-evaluated. Rest. Sip fluids every hour. Rinse sinuses daily with (Neilmed Sinus Rinse). Feel better!

## 2013-08-03 LAB — CULTURE, UPPER RESPIRATORY: Organism ID, Bacteria: NORMAL

## 2013-08-04 ENCOUNTER — Other Ambulatory Visit: Payer: Self-pay | Admitting: Nurse Practitioner

## 2013-08-04 DIAGNOSIS — J989 Respiratory disorder, unspecified: Secondary | ICD-10-CM

## 2013-08-04 MED ORDER — DOXYCYCLINE HYCLATE 100 MG PO TABS: 100.0000 mg | ORAL_TABLET | Freq: Two times a day (BID) | ORAL | Status: DC

## 2013-08-04 NOTE — Progress Notes (Signed)
Pt called stating her cough & nasal congestion is worse. Will start doxy for 7 dys. Advised to call if not improving by end of week.

## 2013-08-07 ENCOUNTER — Encounter: Payer: Self-pay | Admitting: Family Medicine

## 2013-08-07 ENCOUNTER — Ambulatory Visit (INDEPENDENT_AMBULATORY_CARE_PROVIDER_SITE_OTHER): Payer: Medicare Other | Admitting: Family Medicine

## 2013-08-07 VITALS — BP 134/90 | HR 60 | Temp 98.5°F | Wt 187.6 lb

## 2013-08-07 DIAGNOSIS — J019 Acute sinusitis, unspecified: Secondary | ICD-10-CM

## 2013-08-07 MED ORDER — FLUTICASONE PROPIONATE 50 MCG/ACT NA SUSP: 2.0000 | Freq: Every day | NASAL | Status: DC

## 2013-08-07 MED ORDER — METHYLPREDNISOLONE ACETATE 80 MG/ML IJ SUSP
80.0000 mg | Freq: Once | INTRAMUSCULAR | Status: AC
  Administered 2013-08-07: 80 mg via INTRAMUSCULAR

## 2013-08-07 MED ORDER — AMOXICILLIN-POT CLAVULANATE 875-125 MG PO TABS: 1.0000 | ORAL_TABLET | Freq: Two times a day (BID) | ORAL | Status: DC

## 2013-08-07 NOTE — Patient Instructions (Signed)

## 2013-08-07 NOTE — Progress Notes (Signed)
Pre visit review using our clinic review tool, if applicable. No additional management support is needed unless otherwise documented below in the visit note. 

## 2013-08-07 NOTE — Progress Notes (Signed)
  Subjective:     Wendy Christensen is a 67 y.o. female who presents for evaluation of sinus pain. Symptoms include: congestion, cough, facial pain, headaches, nasal congestion, post nasal drip, purulent rhinorrhea and sinus pressure. Onset of symptoms was 11 days ago. Symptoms have been gradually worsening since that time. Past history is significant for asthma. Patient is a non-smoker.  Pt was seen Tuesday and is no better.    The following portions of the patient's history were reviewed and updated as appropriate: allergies, current medications, past family history, past medical history, past social history, past surgical history and problem list.  Review of Systems Pertinent items are noted in HPI.   Objective:    BP 134/90  Pulse 60  Temp(Src) 98.5 F (36.9 C) (Oral)  Wt 187 lb 9.6 oz (85.095 kg)  SpO2 98% General appearance: alert, cooperative, appears stated age and no distress Ears: normal TM's and external ear canals both ears Nose: green discharge, moderate congestion, turbinates red, swollen, sinus tenderness bilateral Throat: lips, mucosa, and tongue normal; teeth and gums normal Neck: mild anterior cervical adenopathy, supple, symmetrical, trachea midline and thyroid not enlarged, symmetric, no tenderness/mass/nodules Lungs: clear to auscultation bilaterally Heart: S1, S2 normal    Assessment:    Acute bacterial sinusitis.    Plan:    Nasal steroids per medication orders. Antihistamines per medication orders. Augmentin per medication orders. f/u prn

## 2013-08-17 ENCOUNTER — Other Ambulatory Visit: Payer: Self-pay | Admitting: Family Medicine

## 2013-09-03 ENCOUNTER — Other Ambulatory Visit: Payer: Self-pay | Admitting: Family Medicine

## 2013-09-22 ENCOUNTER — Ambulatory Visit (INDEPENDENT_AMBULATORY_CARE_PROVIDER_SITE_OTHER): Payer: Medicare Other | Admitting: Family Medicine

## 2013-09-22 ENCOUNTER — Encounter: Payer: Self-pay | Admitting: Family Medicine

## 2013-09-22 ENCOUNTER — Other Ambulatory Visit: Payer: Self-pay | Admitting: Family Medicine

## 2013-09-22 VITALS — BP 126/74 | HR 58 | Temp 98.3°F | Wt 186.0 lb

## 2013-09-22 DIAGNOSIS — R002 Palpitations: Secondary | ICD-10-CM

## 2013-09-22 DIAGNOSIS — R42 Dizziness and giddiness: Secondary | ICD-10-CM

## 2013-09-22 DIAGNOSIS — E039 Hypothyroidism, unspecified: Secondary | ICD-10-CM

## 2013-09-22 DIAGNOSIS — R82998 Other abnormal findings in urine: Secondary | ICD-10-CM

## 2013-09-22 DIAGNOSIS — E785 Hyperlipidemia, unspecified: Secondary | ICD-10-CM

## 2013-09-22 DIAGNOSIS — I10 Essential (primary) hypertension: Secondary | ICD-10-CM

## 2013-09-22 DIAGNOSIS — R8290 Unspecified abnormal findings in urine: Secondary | ICD-10-CM

## 2013-09-22 DIAGNOSIS — R209 Unspecified disturbances of skin sensation: Secondary | ICD-10-CM

## 2013-09-22 LAB — CBC WITH DIFFERENTIAL/PLATELET
Basophils Absolute: 0 10*3/uL (ref 0.0–0.1)
Basophils Relative: 0.5 % (ref 0.0–3.0)
EOS ABS: 0.2 10*3/uL (ref 0.0–0.7)
Eosinophils Relative: 3.2 % (ref 0.0–5.0)
HCT: 37.4 % (ref 36.0–46.0)
Hemoglobin: 12.4 g/dL (ref 12.0–15.0)
Lymphocytes Relative: 27.2 % (ref 12.0–46.0)
Lymphs Abs: 1.7 10*3/uL (ref 0.7–4.0)
MCHC: 33 g/dL (ref 30.0–36.0)
MCV: 83.2 fl (ref 78.0–100.0)
MONO ABS: 0.4 10*3/uL (ref 0.1–1.0)
Monocytes Relative: 6.7 % (ref 3.0–12.0)
NEUTROS PCT: 62.4 % (ref 43.0–77.0)
Neutro Abs: 3.9 10*3/uL (ref 1.4–7.7)
PLATELETS: 299 10*3/uL (ref 150.0–400.0)
RBC: 4.49 Mil/uL (ref 3.87–5.11)
RDW: 14.2 % (ref 11.5–14.6)
WBC: 6.3 10*3/uL (ref 4.5–10.5)

## 2013-09-22 LAB — LIPID PANEL
CHOL/HDL RATIO: 4
Cholesterol: 189 mg/dL (ref 0–200)
HDL: 53.8 mg/dL (ref >39.00)
LDL CALC: 116 mg/dL — AB (ref 0–99)
TRIGLYCERIDES: 96 mg/dL (ref 0.0–149.0)
VLDL: 19.2 mg/dL (ref 0.0–40.0)

## 2013-09-22 LAB — POCT URINALYSIS DIPSTICK
Bilirubin, UA: NEGATIVE
Glucose, UA: NEGATIVE
Ketones, UA: NEGATIVE
NITRITE UA: NEGATIVE
Protein, UA: NEGATIVE
UROBILINOGEN UA: 0.2
pH, UA: 7

## 2013-09-22 LAB — BASIC METABOLIC PANEL
BUN: 22 mg/dL (ref 6–23)
CHLORIDE: 99 meq/L (ref 96–112)
CO2: 32 meq/L (ref 19–32)
CREATININE: 1.2 mg/dL (ref 0.4–1.2)
Calcium: 9 mg/dL (ref 8.4–10.5)
GFR: 56.2 mL/min — ABNORMAL LOW (ref >60.00)
Glucose, Bld: 79 mg/dL (ref 70–99)
Potassium: 3.3 mEq/L — ABNORMAL LOW (ref 3.5–5.1)
Sodium: 137 mEq/L (ref 135–145)

## 2013-09-22 LAB — TSH: TSH: 1.51 u[IU]/mL (ref 0.35–5.50)

## 2013-09-22 LAB — HEPATIC FUNCTION PANEL
ALT: 19 U/L (ref 0–35)
AST: 28 U/L (ref 0–37)
Albumin: 3.9 g/dL (ref 3.5–5.2)
Alkaline Phosphatase: 68 U/L (ref 39–117)
BILIRUBIN DIRECT: 0 mg/dL (ref 0.0–0.3)
BILIRUBIN TOTAL: 0.5 mg/dL (ref 0.3–1.2)
Total Protein: 7.6 g/dL (ref 6.0–8.3)

## 2013-09-22 LAB — VITAMIN B12: VITAMIN B 12: 559 pg/mL (ref 211–911)

## 2013-09-22 NOTE — Assessment & Plan Note (Signed)
Stable con't meds 

## 2013-09-22 NOTE — Progress Notes (Signed)
  S                       }ubjective:     Wendy Christensen is a 68 y.o. female who presents for evaluation of dizziness. The symptoms started 9 months ago and are unchanged. The attacks occur rarely and last 2 minutes. Positions that worsen symptoms: any motion. Previous workup/treatments: none. Associated ear symptoms: none. Associated CNS symptoms: none. Recent infections: sinus infection. Head trauma: denied. Drug ingestion: none. Noise exposure: no occupational exposure. Family history: non-contributory.  Pt felt better after she ate something.  She is also here f/u bp and cholesterol.  No cp, sob or palpatations.  No exercise.  The following portions of the patient's history were reviewed and updated as appropriate: allergies, current medications, past family history, past medical history, past social history, past surgical history and problem list.  Review of Systems Pertinent items are noted in HPI.    Objective:    BP 126/74  Pulse 58  Temp(Src) 98.3 F (36.8 C) (Oral)  Wt 186 lb (84.369 kg)  SpO2 97% General appearance: alert, cooperative, appears stated age and no distress Ears: normal TM's and external ear canals both ears Nose: Nares normal. Septum midline. Mucosa normal. No drainage or sinus tenderness. Throat: lips, mucosa, and tongue normal; teeth and gums normal Neck: no adenopathy, no carotid bruit, no JVD, supple, symmetrical, trachea midline and thyroid not enlarged, symmetric, no tenderness/mass/nodules Lungs: clear to auscultation bilaterally Heart: regular rate and rhythm, S1, S2 normal, no murmur, click, rub or gallop Neurologic: Alert and oriented X 3, normal strength and tone. Normal symmetric reflexes. Normal coordination and gait      Assessment:    dizziness     Plan:    Follow up in a few months. eat 5 small meals daily or 3 meals and 2 snacks

## 2013-09-22 NOTE — Assessment & Plan Note (Signed)
Check labs con't meds 

## 2013-09-22 NOTE — Addendum Note (Signed)
Addended by: Harl Bowie on: 09/22/2013 03:38 PM   Modules accepted: Orders

## 2013-09-22 NOTE — Progress Notes (Signed)
Pre visit review using our clinic review tool, if applicable. No additional management support is needed unless otherwise documented below in the visit note. 

## 2013-09-22 NOTE — Patient Instructions (Signed)

## 2013-09-23 LAB — MICROALBUMIN / CREATININE URINE RATIO
Creatinine,U: 228.1 mg/dL
MICROALB/CREAT RATIO: 0.1 mg/g (ref 0.0–30.0)
Microalb, Ur: 0.3 mg/dL (ref 0.0–1.9)

## 2013-09-24 LAB — URINE CULTURE: Colony Count: 25000

## 2013-09-29 ENCOUNTER — Other Ambulatory Visit: Payer: Self-pay

## 2013-09-29 DIAGNOSIS — I10 Essential (primary) hypertension: Secondary | ICD-10-CM

## 2013-09-29 MED ORDER — LOSARTAN POTASSIUM 100 MG PO TABS: 100.0000 mg | ORAL_TABLET | Freq: Every day | ORAL | Status: DC

## 2013-10-22 ENCOUNTER — Other Ambulatory Visit: Payer: Self-pay | Admitting: Family Medicine

## 2013-11-19 ENCOUNTER — Other Ambulatory Visit: Payer: Self-pay | Admitting: Family Medicine

## 2013-11-21 ENCOUNTER — Other Ambulatory Visit: Payer: Self-pay | Admitting: Family Medicine

## 2014-01-01 ENCOUNTER — Other Ambulatory Visit: Payer: Self-pay | Admitting: Family Medicine

## 2014-03-23 ENCOUNTER — Encounter: Payer: Medicare Other | Admitting: Family Medicine

## 2014-04-06 ENCOUNTER — Other Ambulatory Visit: Payer: Self-pay | Admitting: Family Medicine

## 2014-04-06 NOTE — Telephone Encounter (Signed)
Rx sent to the pharmacy by e-script.//AB/CMA 

## 2014-04-15 ENCOUNTER — Emergency Department (HOSPITAL_COMMUNITY)
Admission: EM | Admit: 2014-04-15 | Discharge: 2014-04-15 | Disposition: A | Discharge status: Home / Self Care | Payer: Medicare Other | Source: Home / Self Care | Attending: Family Medicine | Admitting: Family Medicine

## 2014-04-15 ENCOUNTER — Encounter (HOSPITAL_COMMUNITY): Payer: Self-pay | Admitting: Emergency Medicine

## 2014-04-15 DIAGNOSIS — J069 Acute upper respiratory infection, unspecified: Secondary | ICD-10-CM

## 2014-04-15 MED ORDER — IPRATROPIUM BROMIDE 0.06 % NA SOLN: 2.0000 | Freq: Four times a day (QID) | NASAL | Status: DC

## 2014-04-15 MED ORDER — AZITHROMYCIN 250 MG PO TABS: ORAL_TABLET | ORAL | Status: DC

## 2014-04-15 NOTE — ED Notes (Signed)
C/o  Sore throat.  Congestion.  Bilateral ear pain.   Symptoms present x 1 wk.  Denies fever, n/v/d.   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  No relief with otc meds.

## 2014-04-15 NOTE — Discharge Instructions (Signed)
Drink plenty of fluids as discussed, use medicine as prescribed, and mucinex or delsym for cough. Return or see your doctor if further problems °

## 2014-04-15 NOTE — ED Provider Notes (Signed)
CSN: [id number]     Arrival date & time 04/15/14  1806 History   First MD Initiated Contact with Patient 04/15/14 1815     Chief Complaint  Patient presents with  . Sore Throat   (Consider location/radiation/quality/duration/timing/severity/associated sxs/prior Treatment) Patient is a 68 y.o. female presenting with pharyngitis. The history is provided by the patient.  Sore Throat This is a new problem. The current episode started more than 2 days ago. The problem has been gradually worsening. Pertinent negatives include no shortness of breath.    Past Medical History  Diagnosis Date  . GERD (gastroesophageal reflux disease)   . Hypertension   . Hyperlipidemia   . Anxiety   . Allergic rhinitis   . Anemia   . Depression   . Thyroid goiter   . Asthma    History reviewed. No pertinent past surgical history. Family History  Problem Relation Age of Onset  . Hypertension Mother   . COPD Father   . Hyperlipidemia    . Hypertension    . Asthma Father   . Heart disease Mother   . Kidney cancer Maternal Aunt    History  Substance Use Topics  . Smoking status: Never Smoker   . Smokeless tobacco: Never Used  . Alcohol Use: No   OB History   Grav Para Term Preterm Abortions TAB SAB Ect Mult Living                 Review of Systems  Constitutional: Negative.  Negative for fever.  HENT: Positive for congestion, postnasal drip, rhinorrhea and sore throat.   Respiratory: Positive for cough. Negative for shortness of breath.   Gastrointestinal: Negative.   Genitourinary: Negative.     Allergies  Review of patient's allergies indicates no known allergies.  Home Medications   Prior to Admission medications   Medication Sig Start Date End Date Taking? Authorizing Provider  atorvastatin (LIPITOR) 40 MG tablet TAKE 1 TABLET BY MOUTH DAILY 09/03/13  Yes Alferd Apa Lowne, DO  citalopram (CELEXA) 40 MG tablet Take 40 mg by mouth daily.   Yes Historical Provider, MD  fenofibrate 160  MG tablet TAKE 1 TABLET BY MOUTH DAILY 11/21/13  Yes Yvonne R Lowne, DO  Flaxseed, Linseed, (FLAXSEED OIL) 1000 MG CAPS Take by mouth.   Yes Historical Provider, MD  losartan (COZAAR) 100 MG tablet PT NEEDS LABS DRAWN.  TAKE 1 TABLET BY MOUTH EVERY DAY. 04/06/14  Yes Alferd Apa Lowne, DO  metoprolol (LOPRESSOR) 100 MG tablet TAKE 1 TABLET BY MOUTH TWICE DAILY 08/17/13  Yes Alferd Apa Lowne, DO  metoprolol (LOPRESSOR) 100 MG tablet TAKE 1 TABLET BY MOUTH TWICE DAILY 11/19/13  Yes Rosalita Chessman, DO  Multiple Vitamins-Minerals (MULTIVITAMIN WITH MINERALS) tablet Take 1 tablet by mouth daily.   Yes Historical Provider, MD  Omeprazole-Sodium Bicarbonate (ZEGERID OTC) 20-1100 MG CAPS Take 1 capsule by mouth daily before breakfast.   Yes Historical Provider, MD  vitamin B-12 (CYANOCOBALAMIN) 1000 MCG tablet Take 1,000 mcg by mouth daily.   Yes Historical Provider, MD  albuterol (PROVENTIL HFA;VENTOLIN HFA) 108 (90 BASE) MCG/ACT inhaler Inhale 2 puffs into the lungs every 4 (four) hours as needed for wheezing. 07/31/13   Irene Pap, NP  azithromycin (ZITHROMAX Z-PAK) 250 MG tablet Take as directed on pack 04/15/14   Billy Fischer, MD  ipratropium (ATROVENT) 0.06 % nasal spray Place 2 sprays into both nostrils 4 (four) times daily. 04/15/14   Billy Fischer, MD  BP 146/82  Pulse 71  Temp(Src) 98.7 F (37.1 C) (Oral)  Resp 20  SpO2 94% Physical Exam  Nursing note and vitals reviewed. Constitutional: She is oriented to person, place, and time. She appears well-developed and well-nourished. No distress.  HENT:  Head: Normocephalic.  Right Ear: External ear normal.  Left Ear: External ear normal.  Nose: Nose normal.  Mouth/Throat: Oropharynx is clear and moist.  Eyes: Pupils are equal, round, and reactive to light.  Neck: Normal range of motion. Neck supple.  Cardiovascular: Normal heart sounds.   Pulmonary/Chest: Effort normal and breath sounds normal.  Neurological: She is alert and oriented to person,  place, and time.  Skin: Skin is warm and dry.    ED Course  Procedures (including critical care time) Labs Review Labs Reviewed - No data to display  Imaging Review No results found.   MDM   1. URI (upper respiratory infection)        Billy Fischer, MD 04/15/14 [phone]

## 2014-04-17 LAB — CULTURE, GROUP A STREP

## 2014-04-28 ENCOUNTER — Ambulatory Visit: Payer: Medicare Other | Admitting: Family Medicine

## 2014-04-28 ENCOUNTER — Telehealth: Payer: Self-pay

## 2014-04-28 NOTE — Telephone Encounter (Signed)
Poke with husband and he will give her the message to call back. The patient will need an appointment.     KP

## 2014-04-28 NOTE — Telephone Encounter (Signed)
Caller name:Kerin Relation to pt: Call back number: [phone] Pharmacy:  Reason for call: Wendy Christensen called to cancel her appt for today because she could not get off work, but she wants Dr [name] to call something in for her. She went to ED on 04/15/14 and she is not any better.

## 2014-05-01 NOTE — Telephone Encounter (Signed)
Letter mailed     KP 

## 2014-05-02 ENCOUNTER — Encounter: Payer: Self-pay | Admitting: Family Medicine

## 2014-05-02 ENCOUNTER — Ambulatory Visit (INDEPENDENT_AMBULATORY_CARE_PROVIDER_SITE_OTHER): Payer: Medicare Other | Admitting: Family Medicine

## 2014-05-02 VITALS — BP 130/84 | Temp 98.4°F | Ht 65.0 in | Wt 176.0 lb

## 2014-05-02 DIAGNOSIS — R05 Cough: Secondary | ICD-10-CM

## 2014-05-02 DIAGNOSIS — R059 Cough, unspecified: Secondary | ICD-10-CM

## 2014-05-02 MED ORDER — HYDROCODONE-HOMATROPINE 5-1.5 MG/5ML PO SYRP: 5.0000 mL | ORAL_SOLUTION | Freq: Four times a day (QID) | ORAL | Status: AC | PRN

## 2014-05-02 NOTE — Progress Notes (Signed)
   Subjective:    Patient ID: Wendy Christensen, female    DOB: 10-Nov-1945, 68 y.o.   MRN: [id number]  Sore Throat  Associated symptoms include coughing. Pertinent negatives include no shortness of breath.   Acute visit. Patient seen with persistent cough and sinus congestion symptoms and intermittent sore throat. She was seen at urgent care [facility] on 7/29. She was told take Mucinex DM and also prescribed Zithromax. She states her sinus congestion symptoms have improved somewhat but her cough is not improved. Nonsmoker. Cough is mostly nonproductive. She has history of GERD which is treated with Zegerid. No postnasal drip symptoms currently. Cough is fairly severe at night. No relief with over-the-counter medications.  Past Medical History  Diagnosis Date  . GERD (gastroesophageal reflux disease)   . Hypertension   . Hyperlipidemia   . Anxiety   . Allergic rhinitis   . Anemia   . Depression   . Thyroid goiter   . Asthma    No past surgical history on file.  reports that she has never smoked. She has never used smokeless tobacco. She reports that she does not drink alcohol or use illicit drugs. family history includes Asthma in her father; COPD in her father; Heart disease in her mother; Hyperlipidemia in an other family member; Hypertension in her mother and another family member; Kidney cancer in her maternal aunt. No Known Allergies    Review of Systems  Constitutional: Negative for fever and chills.  HENT: Positive for sore throat. Negative for postnasal drip.   Respiratory: Positive for cough. Negative for shortness of breath.        Objective:   Physical Exam  Constitutional: She appears well-developed and well-nourished.  HENT:  Right Ear: External ear normal.  Left Ear: External ear normal.  Mouth/Throat: Oropharynx is clear and moist.  Neck: Neck supple.  Cardiovascular: Normal rate and regular rhythm.   Pulmonary/Chest: Effort normal and breath sounds  normal. No respiratory distress. She has no wheezes. She has no rales.  Lymphadenopathy:    She has no cervical adenopathy.          Assessment & Plan:  Persistent cough. Nonfocal exam. Suspect post viral bronchitis cough. Hycodan cough syrup for suppression at night. Followup with primary next week if not improving.

## 2014-05-02 NOTE — Patient Instructions (Signed)
Acute Bronchitis Bronchitis is inflammation of the airways that extend from the windpipe into the lungs (bronchi). The inflammation often causes mucus to develop. This leads to a cough, which is the most common symptom of bronchitis.  In acute bronchitis, the condition usually develops suddenly and goes away over time, usually in a couple weeks. Smoking, allergies, and asthma can make bronchitis worse. Repeated episodes of bronchitis may cause further lung problems.  CAUSES Acute bronchitis is most often caused by the same virus that causes a cold. The virus can spread from person to person (contagious) through coughing, sneezing, and touching contaminated objects. SIGNS AND SYMPTOMS   Cough.   Fever.   Coughing up mucus.   Body aches.   Chest congestion.   Chills.   Shortness of breath.   Sore throat.  DIAGNOSIS  Acute bronchitis is usually diagnosed through a physical exam. Your health care provider will also ask you questions about your medical history. Tests, such as chest X-rays, are sometimes done to rule out other conditions.  TREATMENT  Acute bronchitis usually goes away in a couple weeks. Oftentimes, no medical treatment is necessary. Medicines are sometimes given for relief of fever or cough. Antibiotic medicines are usually not needed but may be prescribed in certain situations. In some cases, an inhaler may be recommended to help reduce shortness of breath and control the cough. A cool mist vaporizer may also be used to help thin bronchial secretions and make it easier to clear the chest.  HOME CARE INSTRUCTIONS  Get plenty of rest.   Drink enough fluids to keep your urine clear or pale yellow (unless you have a medical condition that requires fluid restriction). Increasing fluids may help thin your respiratory secretions (sputum) and reduce chest congestion, and it will prevent dehydration.   Take medicines only as directed by your health care provider.  If  you were prescribed an antibiotic medicine, finish it all even if you start to feel better.  Avoid smoking and secondhand smoke. Exposure to cigarette smoke or irritating chemicals will make bronchitis worse. If you are a smoker, consider using nicotine gum or skin patches to help control withdrawal symptoms. Quitting smoking will help your lungs heal faster.   Reduce the chances of another bout of acute bronchitis by washing your hands frequently, avoiding people with cold symptoms, and trying not to touch your hands to your mouth, nose, or eyes.   Keep all follow-up visits as directed by your health care provider.  SEEK MEDICAL CARE IF: Your symptoms do not improve after 1 week of treatment.  SEEK IMMEDIATE MEDICAL CARE IF:  You develop an increased fever or chills.   You have chest pain.   You have severe shortness of breath.  You have bloody sputum.   You develop dehydration.  You faint or repeatedly feel like you are going to pass out.  You develop repeated vomiting.  You develop a severe headache. MAKE SURE YOU:   Understand these instructions.  Will watch your condition.  Will get help right away if you are not doing well or get worse. Document Released: 10/12/2004 Document Revised: 01/19/2014 Document Reviewed: 02/25/2013 [facility] Patient Information 2015 [place], [place]. This information is not intended to replace advice given to you by your health care provider. Make sure you discuss any questions you have with your health care provider.  Follow up with primary provider by next week if your cough is not improving

## 2014-05-02 NOTE — Progress Notes (Signed)
Pre visit review using our clinic review tool, if applicable. No additional management support is needed unless otherwise documented below in the visit note. 

## 2014-05-10 ENCOUNTER — Other Ambulatory Visit: Payer: Self-pay | Admitting: Family Medicine

## 2014-06-05 ENCOUNTER — Other Ambulatory Visit: Payer: Self-pay | Admitting: Family Medicine

## 2014-06-09 ENCOUNTER — Telehealth: Payer: Self-pay

## 2014-06-09 NOTE — Telephone Encounter (Signed)
LVM for pt to call back and schedule AWV.   *schedule with Eula Listen, PA unless pt insist on PCP doing AWV

## 2014-06-13 ENCOUNTER — Other Ambulatory Visit: Payer: Self-pay | Admitting: Family Medicine

## 2014-07-06 ENCOUNTER — Other Ambulatory Visit: Payer: Self-pay

## 2014-07-06 DIAGNOSIS — Z1231 Encounter for screening mammogram for malignant neoplasm of breast: Secondary | ICD-10-CM

## 2014-07-27 ENCOUNTER — Ambulatory Visit
Admission: RE | Admit: 2014-07-27 | Discharge: 2014-07-27 | Disposition: A | Discharge status: Home / Self Care | Payer: Medicare Other | Source: Ambulatory Visit

## 2014-07-27 DIAGNOSIS — Z1231 Encounter for screening mammogram for malignant neoplasm of breast: Secondary | ICD-10-CM

## 2014-09-03 ENCOUNTER — Telehealth: Payer: Self-pay | Admitting: Family Medicine

## 2014-09-03 NOTE — Telephone Encounter (Signed)
Left message for patient to return my call.

## 2014-09-03 NOTE — Telephone Encounter (Signed)
Pt needs an OV and labs.  Called and left a message for call back.

## 2014-09-04 NOTE — Telephone Encounter (Signed)
Pt scheduled follow up with labs 09/07/14

## 2014-09-04 NOTE — Telephone Encounter (Signed)
Med filled.  

## 2014-09-07 ENCOUNTER — Ambulatory Visit: Payer: Self-pay | Admitting: Family Medicine

## 2014-09-14 ENCOUNTER — Other Ambulatory Visit: Payer: Self-pay | Admitting: Family Medicine

## 2014-09-14 ENCOUNTER — Encounter: Payer: Self-pay | Admitting: Family Medicine

## 2014-09-14 ENCOUNTER — Ambulatory Visit (INDEPENDENT_AMBULATORY_CARE_PROVIDER_SITE_OTHER): Payer: Self-pay | Admitting: Family Medicine

## 2014-09-14 VITALS — BP 150/87 | HR 56 | Temp 98.2°F | Wt 173.8 lb

## 2014-09-14 DIAGNOSIS — E785 Hyperlipidemia, unspecified: Secondary | ICD-10-CM

## 2014-09-14 DIAGNOSIS — J208 Acute bronchitis due to other specified organisms: Secondary | ICD-10-CM

## 2014-09-14 DIAGNOSIS — I10 Essential (primary) hypertension: Secondary | ICD-10-CM

## 2014-09-14 DIAGNOSIS — R739 Hyperglycemia, unspecified: Secondary | ICD-10-CM

## 2014-09-14 LAB — CBC WITH DIFFERENTIAL/PLATELET
Basophils Absolute: 0.1 10*3/uL (ref 0.0–0.1)
Basophils Relative: 1 % (ref 0.0–3.0)
EOS ABS: 0.2 10*3/uL (ref 0.0–0.7)
Eosinophils Relative: 3.4 % (ref 0.0–5.0)
HCT: 38 % (ref 36.0–46.0)
Hemoglobin: 12 g/dL (ref 12.0–15.0)
LYMPHS PCT: 29.3 % (ref 12.0–46.0)
Lymphs Abs: 1.9 10*3/uL (ref 0.7–4.0)
MCHC: 31.6 g/dL (ref 30.0–36.0)
MCV: 84.9 fl (ref 78.0–100.0)
MONO ABS: 0.4 10*3/uL (ref 0.1–1.0)
Monocytes Relative: 5.8 % (ref 3.0–12.0)
Neutro Abs: 4 10*3/uL (ref 1.4–7.7)
Neutrophils Relative %: 60.5 % (ref 43.0–77.0)
PLATELETS: 278 10*3/uL (ref 150.0–400.0)
RBC: 4.47 Mil/uL (ref 3.87–5.11)
RDW: 13.8 % (ref 11.5–15.5)
WBC: 6.6 10*3/uL (ref 4.0–10.5)

## 2014-09-14 LAB — HEPATIC FUNCTION PANEL
ALT: 21 U/L (ref 0–35)
AST: 27 U/L (ref 0–37)
Albumin: 3.9 g/dL (ref 3.5–5.2)
Alkaline Phosphatase: 67 U/L (ref 39–117)
BILIRUBIN TOTAL: 0.4 mg/dL (ref 0.2–1.2)
Bilirubin, Direct: 0 mg/dL (ref 0.0–0.3)
Total Protein: 8 g/dL (ref 6.0–8.3)

## 2014-09-14 LAB — LIPID PANEL
CHOLESTEROL: 193 mg/dL (ref 0–200)
HDL: 64.3 mg/dL (ref >39.00)
LDL Cholesterol: 115 mg/dL — ABNORMAL HIGH (ref 0–99)
NonHDL: 128.7
Total CHOL/HDL Ratio: 3
Triglycerides: 70 mg/dL (ref 0.0–149.0)
VLDL: 14 mg/dL (ref 0.0–40.0)

## 2014-09-14 LAB — BASIC METABOLIC PANEL
BUN: 14 mg/dL (ref 6–23)
CALCIUM: 9.2 mg/dL (ref 8.4–10.5)
CO2: 31 meq/L (ref 19–32)
Chloride: 102 mEq/L (ref 96–112)
Creatinine, Ser: 1 mg/dL (ref 0.4–1.2)
GFR: 73.7 mL/min (ref >60.00)
Glucose, Bld: 86 mg/dL (ref 70–99)
Potassium: 4.7 mEq/L (ref 3.5–5.1)
Sodium: 139 mEq/L (ref 135–145)

## 2014-09-14 LAB — HEMOGLOBIN A1C: Hgb A1c MFr Bld: 6.4 % (ref 4.6–6.5)

## 2014-09-14 MED ORDER — HYDROCODONE-HOMATROPINE 5-1.5 MG/5ML PO SYRP: ORAL_SOLUTION | ORAL | Status: DC

## 2014-09-14 MED ORDER — LOSARTAN POTASSIUM 100 MG PO TABS: ORAL_TABLET | ORAL | Status: DC

## 2014-09-14 MED ORDER — AZITHROMYCIN 250 MG PO TABS: ORAL_TABLET | ORAL | Status: DC

## 2014-09-14 NOTE — Progress Notes (Signed)
Pre visit review using our clinic review tool, if applicable. No additional management support is needed unless otherwise documented below in the visit note. 

## 2014-09-14 NOTE — Progress Notes (Signed)
Subjective:     Wendy Christensen is a 68 y.o. female here for evaluation of a cough. Onset of symptoms was 1 week ago. Symptoms have been gradually worsening since that time. The cough is productive and is aggravated by infection, pollens and reclining position. Associated symptoms include: postnasal drip and sputum production. Patient does not have a history of asthma. Patient does have a history of environmental allergens. Patient has not traveled recently. Patient does not have a history of smoking. Patient has not had a previous chest x-ray. Patient has not had a PPD done.  HYPERTENSION  Blood pressure range-not checking   Chest pain- no      Dyspnea- no Lightheadedness- no   Edema- no Other side effects - no   Medication compliance: good Low salt diet- yes  HYPERLIPIDEMIA  Medication compliance- good RUQ pain- no  Muscle aches- no Other side effects-no   The following portions of the patient's history were reviewed and updated as appropriate:  She  has a past medical history of GERD (gastroesophageal reflux disease); Hypertension; Hyperlipidemia; Anxiety; Allergic rhinitis; Anemia; Depression; Thyroid goiter; and Asthma. She  does not have any pertinent problems on file. She  has no past surgical history on file. Her family history includes Asthma in her father; COPD in her father; Heart disease in her mother; Hyperlipidemia in an other family member; Hypertension in her mother and another family member; Kidney cancer in her maternal aunt. She  reports that she has never smoked. She has never used smokeless tobacco. She reports that she does not drink alcohol or use illicit drugs. She has a current medication list which includes the following prescription(s): albuterol, atorvastatin, citalopram, fenofibrate, flaxseed oil, fluticasone, ipratropium, metoprolol, montelukast, multivitamin with minerals, omeprazole-sodium bicarbonate, vitamin b-12, and losartan. Current Outpatient  Prescriptions on File Prior to Visit  Medication Sig Dispense Refill  . albuterol (PROVENTIL HFA;VENTOLIN HFA) 108 (90 BASE) MCG/ACT inhaler Inhale 2 puffs into the lungs every 4 (four) hours as needed for wheezing. 1 Inhaler 0  . atorvastatin (LIPITOR) 40 MG tablet TAKE 1 TABLET BY MOUTH DAILY, REPEAT LABS ARE DUE NOW 30 tablet 0  . citalopram (CELEXA) 40 MG tablet Take 40 mg by mouth daily.    . fenofibrate 160 MG tablet TAKE 1 TABLET BY MOUTH DAILY 30 tablet 5  . Flaxseed, Linseed, (FLAXSEED OIL) 1000 MG CAPS Take by mouth.    Marland Kitchen ipratropium (ATROVENT) 0.06 % nasal spray Place 2 sprays into both nostrils 4 (four) times daily. 15 mL 1  . metoprolol (LOPRESSOR) 100 MG tablet TAKE 1 TABLET BY MOUTH TWICE DAILY 180 tablet 0  . Multiple Vitamins-Minerals (MULTIVITAMIN WITH MINERALS) tablet Take 1 tablet by mouth daily.    Earney Navy Bicarbonate (ZEGERID OTC) 20-1100 MG CAPS Take 1 capsule by mouth daily before breakfast.    . vitamin B-12 (CYANOCOBALAMIN) 1000 MCG tablet Take 1,000 mcg by mouth daily.    Marland Kitchen losartan (COZAAR) 100 MG tablet TAKE 1 TABLET BY MOUTH DAILY. OFFICE VISIT DUE NOW. (Patient not taking: Reported on 09/14/2014) 30 tablet 0   No current facility-administered medications on file prior to visit.   She has No Known Allergies..  Review of Systems Constitutional: negative Ears, nose, mouth, throat, and face: positive for nasal congestion Respiratory: positive for asthma, cough, sputum and wheezing Cardiovascular: negative Musculoskeletal:negative    Objective:    Oxygen saturation 96% on room air BP 150/87 mmHg  Pulse 56  Temp(Src) 98.2 F (36.8 C) (Oral)  Wt  173 lb 12.8 oz (78.835 kg)  SpO2 96% General appearance: alert, cooperative, appears stated age and no distress Ears: normal TM&#39;s and external ear canals both ears Nose: Nares normal. Septum midline. Mucosa normal. No drainage or sinus tenderness. Throat: lips, mucosa, and tongue normal; teeth and  gums normal   Neck: no adenopathy, supple, symmetrical, trachea midline and thyroid not enlarged, symmetric, no tenderness/mass/nodules Lungs: diminished breath sounds bilaterally Heart: S1, S2 normal Extremities: extremities normal, atraumatic, no cyanosis or edema    Assessment:    Acute Bronchitis    Plan:    Antibiotics per medication orders. Antitussives per medication orders. Avoid exposure to tobacco smoke and fumes. B-agonist inhaler. Call if shortness of breath worsens, blood in sputum, change in character of cough, development of fever or chills, inability to maintain nutrition and hydration. Avoid exposure to tobacco smoke and fumes. Chest x-ray.    1. Hyperlipidemia Check labs--- con't lipid and fenofibrate - Basic metabolic panel - CBC with Differential - Hepatic function panel - Hemoglobin A1c - Lipid panel - Microalbumin / creatinine urine ratio - POCT urinalysis dipstick  2. Essential hypertension stabel--- con't metoprolol - Basic metabolic panel - CBC with Differential - Hepatic function panel - Hemoglobin A1c - Lipid panel - Microalbumin / creatinine urine ratio - POCT urinalysis dipstick - losartan (COZAAR) 100 MG tablet; TAKE 1 TABLET BY MOUTH DAILY.  Dispense: 30 tablet; Refill: 0  3. Hyperglycemia  - Basic metabolic panel - CBC with Differential - Hepatic function panel - Hemoglobin A1c - Lipid panel - Microalbumin / creatinine urine ratio - POCT urinalysis dipstick  4. Acute bronchitis due to other specified organisms  - azithromycin (ZITHROMAX Z-PAK) 250 MG tablet; As directed  Dispense: 6 each; Refill: 0 - HYDROcodone-homatropine (HYCODAN) 5-1.5 MG/5ML syrup; 1 tsp po qhs prn cough  Dispense: 120 mL; Refill: 0

## 2014-09-14 NOTE — Patient Instructions (Signed)

## 2014-09-15 ENCOUNTER — Telehealth: Payer: Self-pay | Admitting: Family Medicine

## 2014-09-15 LAB — MICROALBUMIN / CREATININE URINE RATIO
Creatinine,U: 237.9 mg/dL
MICROALB UR: 0.4 mg/dL (ref 0.0–1.9)
Microalb Creat Ratio: 0.2 mg/g (ref 0.0–30.0)

## 2014-09-15 NOTE — Telephone Encounter (Signed)
emmi emailed °

## 2014-09-25 ENCOUNTER — Other Ambulatory Visit: Payer: Self-pay | Admitting: *Deleted

## 2014-09-25 DIAGNOSIS — I10 Essential (primary) hypertension: Secondary | ICD-10-CM

## 2014-09-25 MED ORDER — LOSARTAN POTASSIUM 100 MG PO TABS: ORAL_TABLET | ORAL | Status: DC

## 2014-12-03 ENCOUNTER — Telehealth: Payer: Self-pay

## 2014-12-03 DIAGNOSIS — G4730 Sleep apnea, unspecified: Secondary | ICD-10-CM

## 2014-12-03 NOTE — Telephone Encounter (Signed)
Call from the patient and she was going to th sleep center for her sleep apnea and wanted to get a referral to go back. She said it was located at [facility] Please advise    KP

## 2014-12-04 NOTE — Telephone Encounter (Signed)
She has not been in a long time and would like a referral. Please advise     KP

## 2014-12-04 NOTE — Telephone Encounter (Signed)
Ref placed.      KP 

## 2014-12-04 NOTE — Telephone Encounter (Signed)
She has seen Dr Elsworth Soho for that in past

## 2014-12-04 NOTE — Telephone Encounter (Signed)
Refer to pulm--- DR Elsworth Soho-- dx sleep apnea

## 2015-01-20 DIAGNOSIS — J4530 Mild persistent asthma, uncomplicated: Secondary | ICD-10-CM | POA: Diagnosis not present

## 2015-01-20 DIAGNOSIS — T781XXD Other adverse food reactions, not elsewhere classified, subsequent encounter: Secondary | ICD-10-CM | POA: Diagnosis not present

## 2015-01-20 DIAGNOSIS — J209 Acute bronchitis, unspecified: Secondary | ICD-10-CM | POA: Diagnosis not present

## 2015-01-20 DIAGNOSIS — J300 Vasomotor rhinitis: Secondary | ICD-10-CM | POA: Diagnosis not present

## 2015-01-26 ENCOUNTER — Ambulatory Visit (INDEPENDENT_AMBULATORY_CARE_PROVIDER_SITE_OTHER): Payer: Medicare Other | Admitting: Pulmonary Disease

## 2015-01-26 ENCOUNTER — Encounter: Payer: Self-pay | Admitting: Pulmonary Disease

## 2015-01-26 VITALS — BP 142/62 | HR 64 | Ht 65.0 in | Wt 178.4 lb

## 2015-01-26 DIAGNOSIS — J45909 Unspecified asthma, uncomplicated: Secondary | ICD-10-CM

## 2015-01-26 DIAGNOSIS — G4733 Obstructive sleep apnea (adult) (pediatric): Secondary | ICD-10-CM

## 2015-01-26 NOTE — Assessment & Plan Note (Signed)
CPAP titration study Based on this, we will get you a CPAP machine  The pathophysiology of obstructive sleep apnea , it's cardiovascular consequences & modes of treatment including CPAP were discused with the patient in detail & they evidenced understanding.

## 2015-01-26 NOTE — Patient Instructions (Signed)
CPAP titration study Based on this, we will get you a CPAP machine

## 2015-01-26 NOTE — Assessment & Plan Note (Signed)
Ct qvar Albuterol prn

## 2015-01-26 NOTE — Progress Notes (Signed)
   Subjective:    Patient ID: Wendy Christensen, female    DOB: [birth year], 69 y.o.   MRN: [id number]  HPI  69 year old woman who is referred for evaluation of loud snoring noted by her husband and excessive daytime somnolence.   She reports a past medical history of asthma and has been well controlled, she denies use of saba or nocturnal wheezing requiring albuterol usage, symptoms seem to be mild intermittent and triggered by allergies  Chief Complaint  Patient presents with  . Sleep Consult    Referred by Dr. [name]; Snoring, sometimes will stop breathing during sleep, wheezing during sleep.  Epworth Score: 11    Last seen 12/2012  HST 01/2013 - AHI 18/h  ESS 11  She reports restless sleep and tiredness in the daytime. Husband has not witnessed apneas but has noted loud snoring Epworth sleepiness score is 9/24, port sleepiness as a passenger in a car or while sitting and reading Bedtime is 10 PM, sleep latency is about 30 minutes she sleeps on her side with 3 pillows his symptoms of heartburn and reflux, she is 2 nocturnal awakenings including nocturia without any post void sleep latency, she is out of bed by 8:30 AM feeling tired with occasional dryness of mouth but denies headaches. There is no history suggestive of cataplexy, sleep paralysis or parasomnias  Past Medical History  Diagnosis Date  . GERD (gastroesophageal reflux disease)   . Hypertension   . Hyperlipidemia   . Anxiety   . Allergic rhinitis   . Anemia   . Depression   . Thyroid goiter   . Asthma     Review of Systems  Constitutional: Negative for fever, chills and unexpected weight change.  HENT: Negative for congestion, dental problem, ear pain, nosebleeds, postnasal drip, rhinorrhea, sinus pressure, sneezing, sore throat, trouble swallowing and voice change.   Eyes: Negative for visual disturbance.  Respiratory: Negative for cough, choking and shortness of breath.   Cardiovascular: Negative for chest  pain and leg swelling.  Gastrointestinal: Negative for vomiting, abdominal pain and diarrhea.  Genitourinary: Negative for difficulty urinating.  Musculoskeletal: Negative for arthralgias.  Skin: Negative for rash.  Neurological: Negative for tremors, syncope and headaches.  Hematological: Does not bruise/bleed easily.       Objective:   Physical Exam  Gen. Pleasant, obese, in no distress, normal affect ENT - no lesions, no post nasal drip, class 2-3 airway Neck: No JVD, no thyromegaly, no carotid bruits Lungs: no use of accessory muscles, no dullness to percussion, decreased without rales or rhonchi  Cardiovascular: Rhythm regular, heart sounds  normal, no murmurs or gallops, no peripheral edema Abdomen: soft and non-tender, no hepatosplenomegaly, BS normal. Musculoskeletal: No deformities, no cyanosis or clubbing Neuro:  alert, non focal, no tremors       Assessment & Plan:

## 2015-02-12 ENCOUNTER — Other Ambulatory Visit: Payer: Self-pay | Admitting: Family Medicine

## 2015-02-12 NOTE — Telephone Encounter (Signed)
Left message for patient to return my call.

## 2015-02-12 NOTE — Telephone Encounter (Signed)
Refill sent for atorvastatin. Pt is due for 6 month f/u with Dr [name] on or after 03/16/15.  Please call pt to arrange appt. Thanks!

## 2015-02-16 NOTE — Telephone Encounter (Signed)
Left message for patient to return my call.   Patient does have follow up scheduled for 03/05/15

## 2015-02-17 DIAGNOSIS — H25813 Combined forms of age-related cataract, bilateral: Secondary | ICD-10-CM | POA: Diagnosis not present

## 2015-02-18 ENCOUNTER — Other Ambulatory Visit: Payer: Self-pay

## 2015-02-18 MED ORDER — METOPROLOL TARTRATE 100 MG PO TABS: 100.0000 mg | ORAL_TABLET | Freq: Two times a day (BID) | ORAL | Status: DC

## 2015-02-24 ENCOUNTER — Ambulatory Visit (INDEPENDENT_AMBULATORY_CARE_PROVIDER_SITE_OTHER): Payer: Medicare Other | Admitting: Medical

## 2015-02-24 ENCOUNTER — Encounter: Payer: Self-pay | Admitting: Medical

## 2015-02-24 ENCOUNTER — Encounter (HOSPITAL_BASED_OUTPATIENT_CLINIC_OR_DEPARTMENT_OTHER): Payer: Self-pay

## 2015-02-24 ENCOUNTER — Emergency Department (HOSPITAL_BASED_OUTPATIENT_CLINIC_OR_DEPARTMENT_OTHER)
Admission: EM | Admit: 2015-02-24 | Discharge: 2015-02-24 | Disposition: A | Discharge status: Home / Self Care | Payer: Medicare Other | Attending: Emergency Medicine | Admitting: Emergency Medicine

## 2015-02-24 VITALS — BP 158/91 | HR 94 | Temp 98.6°F | Ht 65.0 in | Wt 173.4 lb

## 2015-02-24 DIAGNOSIS — I10 Essential (primary) hypertension: Secondary | ICD-10-CM | POA: Insufficient documentation

## 2015-02-24 DIAGNOSIS — E785 Hyperlipidemia, unspecified: Secondary | ICD-10-CM | POA: Insufficient documentation

## 2015-02-24 DIAGNOSIS — J45909 Unspecified asthma, uncomplicated: Secondary | ICD-10-CM | POA: Insufficient documentation

## 2015-02-24 DIAGNOSIS — Z79899 Other long term (current) drug therapy: Secondary | ICD-10-CM | POA: Insufficient documentation

## 2015-02-24 DIAGNOSIS — M545 Low back pain, unspecified: Secondary | ICD-10-CM

## 2015-02-24 DIAGNOSIS — R109 Unspecified abdominal pain: Secondary | ICD-10-CM | POA: Diagnosis present

## 2015-02-24 DIAGNOSIS — K219 Gastro-esophageal reflux disease without esophagitis: Secondary | ICD-10-CM | POA: Diagnosis not present

## 2015-02-24 DIAGNOSIS — F329 Major depressive disorder, single episode, unspecified: Secondary | ICD-10-CM | POA: Insufficient documentation

## 2015-02-24 DIAGNOSIS — Z862 Personal history of diseases of the blood and blood-forming organs and certain disorders involving the immune mechanism: Secondary | ICD-10-CM | POA: Diagnosis not present

## 2015-02-24 DIAGNOSIS — R197 Diarrhea, unspecified: Secondary | ICD-10-CM | POA: Insufficient documentation

## 2015-02-24 DIAGNOSIS — Z7951 Long term (current) use of inhaled steroids: Secondary | ICD-10-CM | POA: Diagnosis not present

## 2015-02-24 DIAGNOSIS — E860 Dehydration: Secondary | ICD-10-CM | POA: Diagnosis not present

## 2015-02-24 DIAGNOSIS — R112 Nausea with vomiting, unspecified: Secondary | ICD-10-CM | POA: Insufficient documentation

## 2015-02-24 DIAGNOSIS — F419 Anxiety disorder, unspecified: Secondary | ICD-10-CM | POA: Diagnosis not present

## 2015-02-24 DIAGNOSIS — R1032 Left lower quadrant pain: Secondary | ICD-10-CM | POA: Diagnosis not present

## 2015-02-24 LAB — URINALYSIS, ROUTINE W REFLEX MICROSCOPIC
Bilirubin Urine: NEGATIVE
Glucose, UA: NEGATIVE mg/dL
Hgb urine dipstick: NEGATIVE
Ketones, ur: NEGATIVE mg/dL
Leukocytes, UA: NEGATIVE
Nitrite: NEGATIVE
PROTEIN: NEGATIVE mg/dL
SPECIFIC GRAVITY, URINE: 1.018 (ref 1.005–1.030)
UROBILINOGEN UA: 0.2 mg/dL (ref 0.0–1.0)
pH: 7.5 (ref 5.0–8.0)

## 2015-02-24 LAB — COMPREHENSIVE METABOLIC PANEL
ALT: 24 U/L (ref 14–54)
ANION GAP: 9 (ref 5–15)
AST: 29 U/L (ref 15–41)
Albumin: 4.1 g/dL (ref 3.5–5.0)
Alkaline Phosphatase: 72 U/L (ref 38–126)
BILIRUBIN TOTAL: 0.3 mg/dL (ref 0.3–1.2)
BUN: 15 mg/dL (ref 6–20)
CALCIUM: 9.1 mg/dL (ref 8.9–10.3)
CO2: 28 mmol/L (ref 22–32)
Chloride: 102 mmol/L (ref 101–111)
Creatinine, Ser: 1.12 mg/dL — ABNORMAL HIGH (ref 0.44–1.00)
GFR calc Af Amer: 58 mL/min — ABNORMAL LOW (ref >60)
GFR calc non Af Amer: 50 mL/min — ABNORMAL LOW (ref >60)
Glucose, Bld: 123 mg/dL — ABNORMAL HIGH (ref 65–99)
POTASSIUM: 3.5 mmol/L (ref 3.5–5.1)
Sodium: 139 mmol/L (ref 135–145)
Total Protein: 7.9 g/dL (ref 6.5–8.1)

## 2015-02-24 LAB — CBC WITH DIFFERENTIAL/PLATELET
Basophils Absolute: 0 10*3/uL (ref 0.0–0.1)
Basophils Relative: 0 % (ref 0–1)
Eosinophils Absolute: 0 10*3/uL (ref 0.0–0.7)
Eosinophils Relative: 0 % (ref 0–5)
HCT: 38.4 % (ref 36.0–46.0)
HEMOGLOBIN: 12.6 g/dL (ref 12.0–15.0)
LYMPHS PCT: 7 % — AB (ref 12–46)
Lymphs Abs: 0.8 10*3/uL (ref 0.7–4.0)
MCH: 27.3 pg (ref 26.0–34.0)
MCHC: 32.8 g/dL (ref 30.0–36.0)
MCV: 83.3 fL (ref 78.0–100.0)
MONO ABS: 0.3 10*3/uL (ref 0.1–1.0)
Monocytes Relative: 3 % (ref 3–12)
NEUTROS ABS: 9.8 10*3/uL — AB (ref 1.7–7.7)
Neutrophils Relative %: 90 % — ABNORMAL HIGH (ref 43–77)
Platelets: 283 10*3/uL (ref 150–400)
RBC: 4.61 MIL/uL (ref 3.87–5.11)
RDW: 13 % (ref 11.5–15.5)
WBC: 10.9 10*3/uL — AB (ref 4.0–10.5)

## 2015-02-24 LAB — POCT URINALYSIS DIPSTICK
Bilirubin, UA: NEGATIVE
Glucose, UA: NEGATIVE
Ketones, UA: NEGATIVE
Leukocytes, UA: NEGATIVE
NITRITE UA: NEGATIVE
Protein, UA: 15
SPEC GRAV UA: 1.01
UROBILINOGEN UA: 0.2
pH, UA: 7.5

## 2015-02-24 LAB — LIPASE, BLOOD: LIPASE: 15 U/L — AB (ref 22–51)

## 2015-02-24 MED ORDER — SODIUM CHLORIDE 0.9 % IV BOLUS (SEPSIS)
1000.0000 mL | Freq: Once | INTRAVENOUS | Status: AC
  Administered 2015-02-24: 1000 mL via INTRAVENOUS

## 2015-02-24 MED ORDER — ONDANSETRON HCL 4 MG/2ML IJ SOLN
4.0000 mg | Freq: Once | INTRAMUSCULAR | Status: AC
  Administered 2015-02-24: 4 mg via INTRAVENOUS
  Filled 2015-02-24: qty 2

## 2015-02-24 MED ORDER — MORPHINE SULFATE 4 MG/ML IJ SOLN
4.0000 mg | Freq: Once | INTRAMUSCULAR | Status: AC
  Administered 2015-02-24: 4 mg via INTRAVENOUS
  Filled 2015-02-24: qty 1

## 2015-02-24 MED ORDER — ONDANSETRON 4 MG PO TBDP: 4.0000 mg | ORAL_TABLET | Freq: Three times a day (TID) | ORAL | Status: DC | PRN

## 2015-02-24 NOTE — Discharge Instructions (Signed)
Please follow up with your primary care physician in 1-2 days. If you do not have one please call the [place] number listed above. Please read all discharge instructions and return precautions.   Viral Gastroenteritis Viral gastroenteritis is also known as stomach flu. This condition affects the stomach and intestinal tract. It can cause sudden diarrhea and vomiting. The illness typically lasts 3 to 8 days. Most people develop an immune response that eventually gets rid of the virus. While this natural response develops, the virus can make you quite ill. CAUSES  Many different viruses can cause gastroenteritis, such as rotavirus or noroviruses. You can catch one of these viruses by consuming contaminated food or water. You may also catch a virus by sharing utensils or other personal items with an infected person or by touching a contaminated surface. SYMPTOMS  The most common symptoms are diarrhea and vomiting. These problems can cause a severe loss of body fluids (dehydration) and a body salt (electrolyte) imbalance. Other symptoms may include:  Fever.  Headache.  Fatigue.  Abdominal pain. DIAGNOSIS  Your caregiver can usually diagnose viral gastroenteritis based on your symptoms and a physical exam. A stool sample may also be taken to test for the presence of viruses or other infections. TREATMENT  This illness typically goes away on its own. Treatments are aimed at rehydration. The most serious cases of viral gastroenteritis involve vomiting so severely that you are not able to keep fluids down. In these cases, fluids must be given through an intravenous line (IV). HOME CARE INSTRUCTIONS   Drink enough fluids to keep your urine clear or pale yellow. Drink small amounts of fluids frequently and increase the amounts as tolerated.  Ask your caregiver for specific rehydration instructions.  Avoid:  Foods high in sugar.  Alcohol.  Carbonated  drinks.  Tobacco.  Juice.  Caffeine drinks.  Extremely hot or cold fluids.  Fatty, greasy foods.  Too much intake of anything at one time.  Dairy products until 24 to 48 hours after diarrhea stops.  You may consume probiotics. Probiotics are active cultures of beneficial bacteria. They may lessen the amount and number of diarrheal stools in adults. Probiotics can be found in yogurt with active cultures and in supplements.  Wash your hands well to avoid spreading the virus.  Only take over-the-counter or prescription medicines for pain, discomfort, or fever as directed by your caregiver. Do not give aspirin to children. Antidiarrheal medicines are not recommended.  Ask your caregiver if you should continue to take your regular prescribed and over-the-counter medicines.  Keep all follow-up appointments as directed by your caregiver. SEEK IMMEDIATE MEDICAL CARE IF:   You are unable to keep fluids down.  You do not urinate at least once every 6 to 8 hours.  You develop shortness of breath.  You notice blood in your stool or vomit. This may look like coffee grounds.  You have abdominal pain that increases or is concentrated in one small area (localized).  You have persistent vomiting or diarrhea.  You have a fever.  The patient is a child younger than 3 months, and he or she has a fever.  The patient is a child older than 3 months, and he or she has a fever and persistent symptoms.  The patient is a child older than 3 months, and he or she has a fever and symptoms suddenly get worse.  The patient is a baby, and he or she has no tears when  crying. MAKE SURE YOU:   Understand these instructions.  Will watch your condition.  Will get help right away if you are not doing well or get worse. Document Released: 09/04/2005 Document Revised: 11/27/2011 Document Reviewed: 06/21/2011 [facility] Patient Information 2015 [place], [place]. This information is not intended to replace  advice given to you by your health care provider. Make sure you discuss any questions you have with your health care provider.

## 2015-02-24 NOTE — Progress Notes (Signed)
Pre visit review using our clinic review tool, if applicable. No additional management support is needed unless otherwise documented below in the visit note. 

## 2015-02-24 NOTE — Assessment & Plan Note (Signed)
With acute onset of abdomen and back pain described at level 9/10 with nausea, vomtiing and loose stools, I do want your pt evaluated in ED. I called down stairs and notified charge nurse.  Not also some trace hematuria so kidney stone   Follow up her post ED evaluation.

## 2015-02-24 NOTE — ED Notes (Signed)
PA at bedside.

## 2015-02-24 NOTE — ED Notes (Signed)
Left flank pain since 4am-sent from PCP office in the building

## 2015-02-24 NOTE — ED Provider Notes (Signed)
CSN: [id number]     Arrival date & time 02/24/15  1239 History   First MD Initiated Contact with Patient 02/24/15 1245     Chief Complaint  Patient presents with  . Flank Pain     (Consider location/radiation/quality/duration/timing/severity/associated sxs/prior Treatment) HPI Comments: Patient is a 69 year old female past medical history significant for HTN, HLD, anemia, asthma, GERD presenting to the emergency department with her PCP office for evaluation of acute onset left lower back pain without radiation. She states the back pain comes and goes. States she developed non-episodes of nonbloody nonbilious emesis and nonbloody diarrhea, last episode of vomiting was one hour ago. No modifying factors identified. No abdominal surgical history.  Patient is a 69 y.o. female presenting with flank pain.  Flank Pain This is a new problem. The current episode started today. Associated symptoms include nausea and vomiting. She has tried nothing for the symptoms. The treatment provided no relief.    Past Medical History  Diagnosis Date  . GERD (gastroesophageal reflux disease)   . Hypertension   . Hyperlipidemia   . Anxiety   . Allergic rhinitis   . Anemia   . Depression   . Thyroid goiter   . Asthma    History reviewed. No pertinent past surgical history. Family History  Problem Relation Age of Onset  . Hypertension Mother   . COPD Father   . Hyperlipidemia    . Hypertension    . Asthma Father   . Heart disease Mother   . Kidney cancer Maternal Aunt    History  Substance Use Topics  . Smoking status: Never Smoker   . Smokeless tobacco: Never Used  . Alcohol Use: No   OB History    No data available     Review of Systems  Gastrointestinal: Positive for nausea, vomiting and diarrhea.  Genitourinary: Positive for flank pain.  All other systems reviewed and are negative.     Allergies  Review of patient's allergies indicates no known allergies.  Home Medications    Prior to Admission medications   Medication Sig Start Date End Date Taking? Authorizing Provider  albuterol (PROVENTIL HFA;VENTOLIN HFA) 108 (90 BASE) MCG/ACT inhaler Inhale 2 puffs into the lungs every 4 (four) hours as needed for wheezing. 07/31/13   Irene Pap, NP  atorvastatin (LIPITOR) 40 MG tablet Take 1 tablet (40 mg total) by mouth daily. Repeat labs are due now 02/12/15   Rosalita Chessman, DO  citalopram (CELEXA) 40 MG tablet Take 40 mg by mouth daily.    Historical Provider, MD  fenofibrate 160 MG tablet TAKE 1 TABLET BY MOUTH DAILY 11/21/13   Alferd Apa Lowne, DO  fluticasone (FLONASE) 50 MCG/ACT nasal spray Place 2 sprays into both nostrils as needed. 08/10/14   Historical Provider, MD  losartan (COZAAR) 100 MG tablet TAKE 1 TABLET BY MOUTH DAILY. 09/25/14   Rosalita Chessman, DO  metoprolol (LOPRESSOR) 100 MG tablet Take 1 tablet (100 mg total) by mouth 2 (two) times daily. 02/18/15   Alferd Apa Lowne, DO  montelukast (SINGULAIR) 10 MG tablet Take 1 tablet by mouth at bedtime. 09/07/14   Historical Provider, MD  Multiple Vitamins-Minerals (MULTIVITAMIN WITH MINERALS) tablet Take 1 tablet by mouth daily.    Historical Provider, MD  Omeprazole-Sodium Bicarbonate (ZEGERID OTC) 20-1100 MG CAPS Take 1 capsule by mouth daily before breakfast.    Historical Provider, MD  ondansetron (ZOFRAN ODT) 4 MG disintegrating tablet Take 1 tablet (4 mg total) by mouth every  8 (eight) hours as needed for nausea. 02/24/15   Emonni Depasquale, PA-C  vitamin B-12 (CYANOCOBALAMIN) 1000 MCG tablet Take 1,000 mcg by mouth daily.    Historical Provider, MD   BP 148/84 mmHg  Pulse 86  Temp(Src) 98.7 F (37.1 C) (Oral)  Resp 16  Ht 5\' 5"  (1.651 m)  Wt 173 lb (78.472 kg)  BMI 28.79 kg/m2  SpO2 96% Physical Exam  Constitutional: She is oriented to person, place, and time. She appears well-developed and well-nourished. No distress.  HENT:  Head: Normocephalic and atraumatic.  Right Ear: External ear normal.   Left Ear: External ear normal.  Nose: Nose normal.  Mouth/Throat: Uvula is midline and oropharynx is clear and moist. Mucous membranes are dry.  Eyes: Conjunctivae are normal.  Neck: Normal range of motion. Neck supple.  No nuchal rigidity.   Cardiovascular: Normal rate, regular rhythm and normal heart sounds.   Pulmonary/Chest: Effort normal and breath sounds normal. No respiratory distress.  Abdominal: Soft. There is no tenderness. There is no rigidity, no rebound, no guarding and no CVA tenderness.  Musculoskeletal: Normal range of motion.  Neurological: She is alert and oriented to person, place, and time.  Skin: Skin is warm and dry. She is not diaphoretic.  Psychiatric: She has a normal mood and affect.  Nursing note and vitals reviewed.   ED Course  Procedures (including critical care time) Medications  ondansetron (ZOFRAN) injection 4 mg (4 mg Intravenous Given 02/24/15 1353)  sodium chloride 0.9 % bolus 1,000 mL (0 mLs Intravenous Stopped 02/24/15 1611)  morphine 4 MG/ML injection 4 mg (4 mg Intravenous Given 02/24/15 1357)    Labs Review Labs Reviewed  CBC WITH DIFFERENTIAL/PLATELET - Abnormal; Notable for the following:    WBC 10.9 (*)    Neutrophils Relative % 90 (*)    Neutro Abs 9.8 (*)    Lymphocytes Relative 7 (*)    All other components within normal limits  COMPREHENSIVE METABOLIC PANEL - Abnormal; Notable for the following:    Glucose, Bld 123 (*)    Creatinine, Ser 1.12 (*)    GFR calc non Af Amer 50 (*)    GFR calc Af Amer 58 (*)    All other components within normal limits  LIPASE, BLOOD - Abnormal; Notable for the following:    Lipase 15 (*)    All other components within normal limits  URINALYSIS, ROUTINE W REFLEX MICROSCOPIC (NOT AT [facility])    Imaging Review No results found.   EKG Interpretation None      MDM   Final diagnoses:  Nausea vomiting and diarrhea    Filed Vitals:   02/24/15 1611  BP: 148/84  Pulse: 86  Temp:   Resp: 16    Afebrile, NAD, non-toxic appearing, AAOx4.  I have reviewed nursing notes, vital signs, and all appropriate lab and imaging results if ordered as above.  Patient with symptoms consistent with viral gastroenteritis.  Vitals are stable, no fever.  No signs of dehydration, tolerating PO fluids > 6 oz.  Lungs are clear.  No focal abdominal pain, no concern for appendicitis, cholecystitis, pancreatitis, ruptured viscus, UTI, kidney stone, or any other abdominal etiology.  Supportive therapy indicated with return if symptoms worsen.  Patient counseled. Patient is stable at time of discharge. Patient d/w with Dr. Darl Householder, agrees with plan.      Baron Sane, PA-C 02/24/15 Baden Yao, MD 02/25/15 [phone]

## 2015-02-24 NOTE — Progress Notes (Signed)
Subjective:    Patient ID: Wendy Christensen, female    DOB: [birth year], 69 y.o.   MRN: [id number]  HPI  Pt in with pain. She states level 9/10 pain. This started at 4 am. Pt has been vomiting. About 9-10 times. Some loose stools today as well about the same. Some chills. No sweats. No fever. Some possible exposure to pin worms noted and tooks some otc med for that recenlty.   Pain left cva pain and radiates to left flank.   Review of Systems  Respiratory: Negative for cough, chest tightness, shortness of breath and wheezing.   Cardiovascular: Negative for chest pain and palpitations.  Gastrointestinal: Positive for nausea, vomiting, abdominal pain and diarrhea.  Musculoskeletal: Positive for back pain.  Hematological: Negative for adenopathy. Does not bruise/bleed easily.    Past Medical History  Diagnosis Date  . GERD (gastroesophageal reflux disease)   . Hypertension   . Hyperlipidemia   . Anxiety   . Allergic rhinitis   . Anemia   . Depression   . Thyroid goiter   . Asthma     History   Social History  . Marital Status: Married    Spouse Name: N/A  . Number of Children: N/A  . Years of Education: N/A   Occupational History  . Retired    Social History Main Topics  . Smoking status: Never Smoker   . Smokeless tobacco: Never Used  . Alcohol Use: No  . Drug Use: No  . Sexual Activity: Not on file     Comment: none   Other Topics Concern  . 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  Family History  Problem Relation Age of Onset  . Hypertension Mother   . COPD Father   . Hyperlipidemia    . Hypertension    . Asthma Father   . Heart disease Mother   . Kidney cancer Maternal Aunt     No Known Allergies  No current facility-administered medications on file prior to visit.   Current Outpatient Prescriptions on File Prior to Visit  Medication Sig Dispense Refill    . albuterol (PROVENTIL HFA;VENTOLIN HFA) 108 (90 BASE) MCG/ACT inhaler Inhale 2 puffs into the lungs every 4 (four) hours as needed for wheezing. 1 Inhaler 0  . atorvastatin (LIPITOR) 40 MG tablet Take 1 tablet (40 mg total) by mouth daily. Repeat labs are due now 90 tablet 0  . citalopram (CELEXA) 40 MG tablet Take 40 mg by mouth daily.    . fenofibrate 160 MG tablet TAKE 1 TABLET BY MOUTH DAILY 30 tablet 5  . fluticasone (FLONASE) 50 MCG/ACT nasal spray Place 2 sprays into both nostrils as needed.  3  . losartan (COZAAR) 100 MG tablet TAKE 1 TABLET BY MOUTH DAILY. 30 tablet 5  . metoprolol (LOPRESSOR) 100 MG tablet Take 1 tablet (100 mg total) by mouth 2 (two) times daily. 180 tablet 1  . montelukast (SINGULAIR) 10 MG tablet Take 1 tablet by mouth at bedtime.    . Multiple Vitamins-Minerals (MULTIVITAMIN WITH MINERALS) tablet Take 1 tablet by mouth daily.    E[place]ney [place] Bicarbonate (ZEGERID OTC) 20-1100 MG CAPS Take 1 capsule by mouth daily before breakfast.    . vitamin B-12 (CYANOCOBALAMIN) 1000 MCG tablet Take 1,000 mcg by mouth daily.      BP 158/91 mmHg  Pulse 94  Temp(Src) 98.6 F (37  C) (Oral)  Ht 5\' 5"  (1.651 m)  Wt 173 lb 6.4 oz (78.654 kg)  BMI 28.86 kg/m2  SpO2 100%       Objective:   Physical Exam  General Appearance- Not in acute distress.  HEENT Eyes- Scleraeral/Conjuntiva-bilat- Not Yellow. Mouth & Throat- Normal.  Chest and Lung Exam Auscultation: Breath sounds:-Normal. Adventitious sounds:- No Adventitious sounds.  Cardiovascular Auscultation:Rythm - Regular. Heart Sounds -Normal heart sounds.  Abdomen Inspection:-Inspection Normal.  Palpation/Perucssion: Palpation and Percussion of the abdomen reveal- luq and llq  Tender, No Rebound tenderness, No rigidity(Guarding) and No Palpable abdominal masses.  Liver:-Normal.  Spleen:- Normal.   Back- lt upper cva area pain on palpation.      Assessment & Plan:

## 2015-02-24 NOTE — Patient Instructions (Addendum)
With acute onset of abdomen and back pain described at level 9/10 with nausea, vomtiing and loose stools, I do want your pt evaluated in ED. I called down stairs and notified charge nurse.  Not also some trace hematuria so kidney stone   Follow up her post ED evaluation.

## 2015-03-04 ENCOUNTER — Ambulatory Visit: Payer: Self-pay | Admitting: Family Medicine

## 2015-03-05 ENCOUNTER — Telehealth: Payer: Self-pay | Admitting: Family Medicine

## 2015-03-05 ENCOUNTER — Encounter: Payer: Self-pay | Admitting: Family Medicine

## 2015-03-05 NOTE — Telephone Encounter (Signed)
Pt no show 03/04/15 4:00pm, 6 month Follow Up, pt called in 6/13 requesting appt be moved up and Kim agreed to 6/16 4:00pm, patient left message on VM on 03/04/15 at 7:12am stating that she cannot get out of work, appt has not been rescheduled, charge?

## 2015-03-08 NOTE — Telephone Encounter (Signed)
yes

## 2015-03-11 ENCOUNTER — Other Ambulatory Visit: Payer: Self-pay | Admitting: Family Medicine

## 2015-03-11 ENCOUNTER — Telehealth: Payer: Self-pay | Admitting: Family Medicine

## 2015-03-11 DIAGNOSIS — E785 Hyperlipidemia, unspecified: Secondary | ICD-10-CM

## 2015-03-11 NOTE — Telephone Encounter (Signed)
Please advise      KP 

## 2015-03-11 NOTE — Telephone Encounter (Signed)
Caller name: Senta Kantor Relationship to patient: self Can be reached: [phone] (H)  Reason for call: Pt called to reschedule 6 month follow up appt for 03/30/15. She wants labs the week before. If ok, please enter orders.

## 2015-03-11 NOTE — Telephone Encounter (Signed)
Order is in.

## 2015-03-15 ENCOUNTER — Ambulatory Visit: Payer: Self-pay | Admitting: Family Medicine

## 2015-03-23 ENCOUNTER — Other Ambulatory Visit (INDEPENDENT_AMBULATORY_CARE_PROVIDER_SITE_OTHER): Payer: Medicare Other

## 2015-03-23 DIAGNOSIS — E785 Hyperlipidemia, unspecified: Secondary | ICD-10-CM

## 2015-03-23 LAB — HEPATIC FUNCTION PANEL
ALBUMIN: 3.6 g/dL (ref 3.5–5.2)
ALT: 9 U/L (ref 0–35)
AST: 14 U/L (ref 0–37)
Alkaline Phosphatase: 60 U/L (ref 39–117)
BILIRUBIN DIRECT: 0 mg/dL (ref 0.0–0.3)
TOTAL PROTEIN: 7.3 g/dL (ref 6.0–8.3)
Total Bilirubin: 0.3 mg/dL (ref 0.2–1.2)

## 2015-03-23 LAB — CBC WITH DIFFERENTIAL/PLATELET
BASOS ABS: 0 10*3/uL (ref 0.0–0.1)
Basophils Relative: 0.4 % (ref 0.0–3.0)
EOS ABS: 0.3 10*3/uL (ref 0.0–0.7)
Eosinophils Relative: 4.4 % (ref 0.0–5.0)
HCT: 34 % — ABNORMAL LOW (ref 36.0–46.0)
HEMOGLOBIN: 11.1 g/dL — AB (ref 12.0–15.0)
LYMPHS ABS: 1.3 10*3/uL (ref 0.7–4.0)
LYMPHS PCT: 20.3 % (ref 12.0–46.0)
MCHC: 32.6 g/dL (ref 30.0–36.0)
MCV: 83.5 fl (ref 78.0–100.0)
MONO ABS: 0.3 10*3/uL (ref 0.1–1.0)
Monocytes Relative: 4.8 % (ref 3.0–12.0)
Neutro Abs: 4.5 10*3/uL (ref 1.4–7.7)
Neutrophils Relative %: 70.1 % (ref 43.0–77.0)
PLATELETS: 312 10*3/uL (ref 150.0–400.0)
RBC: 4.07 Mil/uL (ref 3.87–5.11)
RDW: 13.6 % (ref 11.5–15.5)
WBC: 6.4 10*3/uL (ref 4.0–10.5)

## 2015-03-23 LAB — BASIC METABOLIC PANEL
BUN: 22 mg/dL (ref 6–23)
CO2: 31 meq/L (ref 19–32)
CREATININE: 1.68 mg/dL — AB (ref 0.40–1.20)
Calcium: 9.2 mg/dL (ref 8.4–10.5)
Chloride: 101 mEq/L (ref 96–112)
GFR: 39.04 mL/min — ABNORMAL LOW (ref >60.00)
Glucose, Bld: 103 mg/dL — ABNORMAL HIGH (ref 70–99)
POTASSIUM: 4.1 meq/L (ref 3.5–5.1)
Sodium: 138 mEq/L (ref 135–145)

## 2015-03-23 LAB — LIPID PANEL
Cholesterol: 159 mg/dL (ref 0–200)
HDL: 45.6 mg/dL (ref >39.00)
LDL Cholesterol: 96 mg/dL (ref 0–99)
NONHDL: 113.4
Total CHOL/HDL Ratio: 3
Triglycerides: 86 mg/dL (ref 0.0–149.0)
VLDL: 17.2 mg/dL (ref 0.0–40.0)

## 2015-03-23 LAB — MICROALBUMIN / CREATININE URINE RATIO
Creatinine,U: 231.4 mg/dL
Microalb Creat Ratio: 0.3 mg/g (ref 0.0–30.0)
Microalb, Ur: <0.7 mg/dL (ref 0.0–1.9)

## 2015-03-26 ENCOUNTER — Ambulatory Visit (HOSPITAL_BASED_OUTPATIENT_CLINIC_OR_DEPARTMENT_OTHER): Payer: Medicare Other | Attending: Pulmonary Disease | Admitting: *Deleted

## 2015-03-26 VITALS — Ht 65.0 in | Wt 172.0 lb

## 2015-03-26 DIAGNOSIS — G4733 Obstructive sleep apnea (adult) (pediatric): Secondary | ICD-10-CM | POA: Diagnosis not present

## 2015-03-26 DIAGNOSIS — G4730 Sleep apnea, unspecified: Secondary | ICD-10-CM | POA: Diagnosis present

## 2015-03-30 ENCOUNTER — Ambulatory Visit (INDEPENDENT_AMBULATORY_CARE_PROVIDER_SITE_OTHER): Payer: Medicare Other | Admitting: Family Medicine

## 2015-03-30 ENCOUNTER — Other Ambulatory Visit: Payer: Self-pay

## 2015-03-30 ENCOUNTER — Encounter: Payer: Self-pay | Admitting: Family Medicine

## 2015-03-30 VITALS — BP 170/90 | HR 91 | Temp 98.3°F | Ht 65.0 in | Wt 174.0 lb

## 2015-03-30 DIAGNOSIS — I10 Essential (primary) hypertension: Secondary | ICD-10-CM

## 2015-03-30 DIAGNOSIS — N289 Disorder of kidney and ureter, unspecified: Secondary | ICD-10-CM | POA: Insufficient documentation

## 2015-03-30 DIAGNOSIS — K219 Gastro-esophageal reflux disease without esophagitis: Secondary | ICD-10-CM

## 2015-03-30 DIAGNOSIS — R7989 Other specified abnormal findings of blood chemistry: Secondary | ICD-10-CM

## 2015-03-30 MED ORDER — VALSARTAN 320 MG PO TABS: 320.0000 mg | ORAL_TABLET | Freq: Every day | ORAL | Status: DC

## 2015-03-30 MED ORDER — OMEPRAZOLE 20 MG PO CPDR: 20.0000 mg | DELAYED_RELEASE_CAPSULE | Freq: Every day | ORAL | Status: DC

## 2015-03-30 NOTE — Assessment & Plan Note (Signed)
Elevated today D/c cozaar and start diovan 320 mg 1 po qd rto 3 months or sooner prn

## 2015-03-30 NOTE — Assessment & Plan Note (Signed)
Referral for nephrology pending Lab Results  Component Value Date   CREATININE 1.68* 03/23/2015

## 2015-03-30 NOTE — Patient Instructions (Signed)

## 2015-03-30 NOTE — Progress Notes (Signed)
Patient ID: Wendy Christensen, female    DOB: Mar 22, 1946  Age: 69 y.o. MRN: [id number]    Subjective:  Subjective HPI Wendy Christensen presents for f/u bp -- bp has been running high the last several office visits.  No cp, no sob.   Review of Systems  Constitutional: Negative for diaphoresis, appetite change, fatigue and unexpected weight change.  Eyes: Negative for pain, redness and visual disturbance.  Respiratory: Negative for cough, chest tightness, shortness of breath and wheezing.   Cardiovascular: Negative for chest pain, palpitations and leg swelling.  Endocrine: Negative for cold intolerance, heat intolerance, polydipsia, polyphagia and polyuria.  Genitourinary: Negative for dysuria, frequency and difficulty urinating.  Neurological: Negative for dizziness, light-headedness, numbness and headaches.  Psychiatric/Behavioral: Negative for dysphoric mood and decreased concentration. The patient is not nervous/anxious.     History Past Medical History  Diagnosis Date  . GERD (gastroesophageal reflux disease)   . Hypertension   . Hyperlipidemia   . Anxiety   . Allergic rhinitis   . Anemia   . Depression   . Thyroid goiter   . Asthma     She has no past surgical history on file.   Her family history includes Asthma in her father; COPD in her father; Heart disease in her mother; Hyperlipidemia in an other family member; Hypertension in her mother and another family member; Kidney cancer in her maternal aunt.She reports that she has never smoked. She has never used smokeless tobacco. She reports that she does not drink alcohol or use illicit drugs.  Current Outpatient Prescriptions on File Prior to Visit  Medication Sig Dispense Refill  . albuterol (PROVENTIL HFA;VENTOLIN HFA) 108 (90 BASE) MCG/ACT inhaler Inhale 2 puffs into the lungs every 4 (four) hours as needed for wheezing. 1 Inhaler 0  . atorvastatin (LIPITOR) 40 MG tablet Take 1 tablet (40 mg total) by mouth  daily. Repeat labs are due now 90 tablet 0  . citalopram (CELEXA) 40 MG tablet Take 40 mg by mouth daily.    . fenofibrate 160 MG tablet TAKE 1 TABLET BY MOUTH DAILY 30 tablet 5  . fluticasone (FLONASE) 50 MCG/ACT nasal spray Place 2 sprays into both nostrils as needed.  3  . metoprolol (LOPRESSOR) 100 MG tablet Take 1 tablet (100 mg total) by mouth 2 (two) times daily. 180 tablet 1  . montelukast (SINGULAIR) 10 MG tablet Take 1 tablet by mouth at bedtime.    . Multiple Vitamins-Minerals (MULTIVITAMIN WITH MINERALS) tablet Take 1 tablet by mouth daily.    [place] [employer] Bicarbonate (ZEGERID OTC) 20-1100 MG CAPS Take 1 capsule by mouth daily before breakfast.    . ondansetron (ZOFRAN ODT) 4 MG disintegrating tablet Take 1 tablet (4 mg total) by mouth every 8 (eight) hours as needed for nausea. 10 tablet 0  . vitamin B-12 (CYANOCOBALAMIN) 1000 MCG tablet Take 1,000 mcg by mouth daily.     No current facility-administered medications on file prior to visit.     Objective:  Objective Physical Exam  Constitutional: She is oriented to person, place, and time. She appears well-developed and well-nourished.  HENT:  Head: Normocephalic and atraumatic.  Eyes: Conjunctivae and EOM are normal.  Neck: Normal range of motion. Neck supple. No JVD present. Carotid bruit is not present. No thyromegaly present.  Cardiovascular: Normal rate, regular rhythm and normal heart sounds.   No murmur heard. Pulmonary/Chest: Effort normal and breath sounds normal. No respiratory distress. She has no wheezes. She has no rales. She  exhibits no tenderness.  Musculoskeletal: She exhibits no edema.  Neurological: She is alert and oriented to person, place, and time.  Psychiatric: She has a normal mood and affect. Her behavior is normal.   BP 170/90 mmHg  Pulse 91  Temp(Src) 98.3 F (36.8 C) (Oral)  Ht 5\' 5"  (1.651 m)  Wt 174 lb (78.926 kg)  BMI 28.96 kg/m2  SpO2 98% Wt Readings from Last 3 Encounters:    03/30/15 174 lb (78.926 kg)  03/26/15 172 lb (78.019 kg)  02/24/15 173 lb (78.472 kg)     Lab Results  Component Value Date   WBC 6.4 03/23/2015   HGB 11.1* 03/23/2015   HCT 34.0* 03/23/2015   PLT 312.0 03/23/2015   GLUCOSE 103* 03/23/2015   CHOL 159 03/23/2015   TRIG 86.0 03/23/2015   HDL 45.60 03/23/2015   LDLDIRECT 215.7 10/18/2011   LDLCALC 96 03/23/2015   ALT 9 03/23/2015   AST 14 03/23/2015   NA 138 03/23/2015   K 4.1 03/23/2015   CL 101 03/23/2015   CREATININE 1.68* 03/23/2015   BUN 22 03/23/2015   CO2 31 03/23/2015   TSH 1.51 09/22/2013   HGBA1C 6.4 09/14/2014   MICROALBUR <0.7 03/23/2015    No results found.   Assessment & Plan:  Plan I have discontinued Wendy Christensen's losartan. I am also having her start on valsartan and omeprazole. Additionally, I am having her maintain her multivitamin with minerals, Omeprazole-Sodium Bicarbonate, citalopram, vitamin B-12, albuterol, fenofibrate, montelukast, fluticasone, atorvastatin, metoprolol, ondansetron, and FLOVENT HFA.  Meds ordered this encounter  Medications  . FLOVENT HFA 110 MCG/ACT inhaler    Sig: Inhale 2 puffs by mouth daily.    Refill:  3  . valsartan (DIOVAN) 320 MG tablet    Sig: Take 1 tablet (320 mg total) by mouth daily.    Dispense:  30 tablet    Refill:  2  . omeprazole (PRILOSEC) 20 MG capsule    Sig: Take 1 capsule (20 mg total) by mouth daily.    Dispense:  90 capsule    Refill:  3    Problem List Items Addressed This Visit    Renal insufficiency    Referral for nephrology pending Lab Results  Component Value Date   CREATININE 1.68* 03/23/2015         GERD   Relevant Medications   omeprazole (PRILOSEC) 20 MG capsule   Essential hypertension - Primary    Elevated today D/c cozaar and start diovan 320 mg 1 po qd rto 3 months or sooner prn       Relevant Medications   valsartan (DIOVAN) 320 MG tablet      Follow-up: Return in about 6 months (around 09/30/2015), or if  symptoms worsen or fail to improve, for annual exam, fasting.  Garnet Koyanagi, DO

## 2015-03-30 NOTE — Progress Notes (Signed)
Pre visit review using our clinic review tool, if applicable. No additional management support is needed unless otherwise documented below in the visit note. 

## 2015-03-31 NOTE — Addendum Note (Signed)
Addended by: Rigoberto Noel on: 03/31/2015 04:05 PM   Modules accepted: Level of Service

## 2015-03-31 NOTE — Progress Notes (Signed)
Patient Name: Wendy Christensen, Wendy Christensen Date: 03/26/2015 Gender: Female D.O.B: 11-20-1945 Age (years): 26 Referring Provider: Not Available Height (inches): 65 Interpreting Physician: Kara Mead MD, ABSM Weight (lbs): 172 RPSGT: Gerhard Perches BMI: 29 MRN: [id number] Neck Size: 14.00  CLINICAL INFORMATION The patient is referred for a CPAP titration to treat sleep apnea. HST 01/2013 showed AHI 18/h  SLEEP STUDY TECHNIQUE As per the AASM Manual for the Scoring of Sleep and Associated Events v2.3 (April 2016) with a hypopnea requiring 4% desaturations.  The channels recorded and monitored were frontal, central and occipital EEG, electrooculogram (EOG), submentalis EMG (chin), nasal and oral airflow, thoracic and abdominal wall motion, anterior tibialis EMG, snore microphone, electrocardiogram, and pulse oximetry. Continuous positive airway pressure (CPAP) was initiated at the beginning of the study and titrated to treat sleep-disordered breathing.  MEDICATIONS Medications taken by the patient : N/A  Medications administered by patient during sleep study : No sleep medicine administered. RESPIRATORY PARAMETERS Optimal PAP Pressure (cm): 10 AHI at Optimal Pressure (/hr): 0.0 Overall Minimal O2 (%): 93.00 Supine % at Optimal Pressure (%): 0 Minimal O2 at Optimal Pressure (%): 93.0   SLEEP ARCHITECTURE The study was initiated at 10:40:52 PM and ended at 4:58:38 AM.  Sleep onset time was 43.6 minutes and the sleep efficiency was 70.0%. The total sleep time was 264.5 minutes.  The patient spent 3.97% of the night in stage N1 sleep, 77.88% in stage N2 sleep, 7.37% in stage N3 and 10.78% in REM.Stage REM latency was 248.5 minutes  Wake after sleep onset was 69.6. Alpha intrusion was absent. Supine sleep was 14.37%.  CARDIAC DATA The 2 lead EKG demonstrated sinus rhythm. The mean heart rate was 61.28 beats per minute. Other EKG findings include: None. LEG MOVEMENT DATA The total  Periodic Limb Movements of Sleep (PLMS) were 1. The PLMS index was 0.23. A PLMS index of <15 is considered normal in adults.  IMPRESSIONS The optimal PAP pressure was 10 cm of water. Central sleep apnea was not noted during this titration (CAI = 0.0/h). Significant oxygen desaturations were not observed during this titration (min O2 = 93.00%). The patient snored with Soft snoring volume during this titration study. No cardiac abnormalities were observed during this study. Clinically significant periodic limb movements were not noted during this study. Arousals associated with PLMs were rare.  DIAGNOSIS Obstructive Sleep Apnea (327.23 [G47.33 ICD-10])  RECOMMENDATIONS Trial of CPAP therapy on 10 cm H2O with a Standard size Fisher&Paykel Nasal Pillow Mask Pilairo Q mask and heated humidification. Avoid alcohol, sedatives and other CNS depressants that may worsen sleep apnea and disrupt normal sleep architecture. Sleep hygiene should be reviewed to assess factors that may improve sleep quality. Weight management and regular exercise should be initiated or continued. Return for re-evaluation after 4 weeks of therapy  Rigoberto Noel. MD

## 2015-04-03 DIAGNOSIS — G4730 Sleep apnea, unspecified: Secondary | ICD-10-CM | POA: Diagnosis not present

## 2015-04-07 DIAGNOSIS — R944 Abnormal results of kidney function studies: Secondary | ICD-10-CM

## 2015-04-14 ENCOUNTER — Other Ambulatory Visit: Payer: Self-pay | Admitting: Family Medicine

## 2015-05-04 DIAGNOSIS — I10 Essential (primary) hypertension: Secondary | ICD-10-CM | POA: Diagnosis not present

## 2015-05-04 DIAGNOSIS — R7989 Other specified abnormal findings of blood chemistry: Secondary | ICD-10-CM | POA: Diagnosis not present

## 2015-05-10 ENCOUNTER — Other Ambulatory Visit: Payer: Self-pay | Admitting: Nephrology

## 2015-05-10 DIAGNOSIS — R7989 Other specified abnormal findings of blood chemistry: Secondary | ICD-10-CM

## 2015-05-17 ENCOUNTER — Other Ambulatory Visit: Payer: Self-pay

## 2015-05-18 ENCOUNTER — Ambulatory Visit
Admission: RE | Admit: 2015-05-18 | Discharge: 2015-05-18 | Disposition: A | Discharge status: Home / Self Care | Payer: Medicare Other | Source: Ambulatory Visit | Attending: Nephrology | Admitting: Nephrology

## 2015-05-18 DIAGNOSIS — N1330 Unspecified hydronephrosis: Secondary | ICD-10-CM | POA: Diagnosis not present

## 2015-05-18 DIAGNOSIS — R7989 Other specified abnormal findings of blood chemistry: Secondary | ICD-10-CM

## 2015-05-20 DIAGNOSIS — H25813 Combined forms of age-related cataract, bilateral: Secondary | ICD-10-CM | POA: Diagnosis not present

## 2015-06-03 ENCOUNTER — Telehealth: Payer: Self-pay | Admitting: Pulmonary Disease

## 2015-06-03 DIAGNOSIS — G4733 Obstructive sleep apnea (adult) (pediatric): Secondary | ICD-10-CM

## 2015-06-03 NOTE — Telephone Encounter (Signed)
OSA was corrected by CPPA 10 cm Start - CPAP therapy on 10 cm H2O with a Standard size Fisher&Paykel Nasal Pillow Mask Pilairo Q mask and heated humidification, download in 4 wks OV in 6 wks with TP or me

## 2015-06-03 NOTE — Telephone Encounter (Signed)
Called and spoke to pt. Pt is requesting results of sleep study she had in 03/2015.   Dr. Elsworth Soho, please advise. Thanks.

## 2015-06-04 NOTE — Telephone Encounter (Signed)
Patient notified. Scheduled for follow up appointment Order entered for CPAP. Nothing further needed.

## 2015-06-07 DIAGNOSIS — N132 Hydronephrosis with renal and ureteral calculous obstruction: Secondary | ICD-10-CM | POA: Diagnosis not present

## 2015-06-07 DIAGNOSIS — N1330 Unspecified hydronephrosis: Secondary | ICD-10-CM | POA: Diagnosis not present

## 2015-06-07 DIAGNOSIS — N200 Calculus of kidney: Secondary | ICD-10-CM | POA: Diagnosis not present

## 2015-06-08 ENCOUNTER — Other Ambulatory Visit: Payer: Self-pay | Admitting: Urology

## 2015-06-11 ENCOUNTER — Encounter (HOSPITAL_BASED_OUTPATIENT_CLINIC_OR_DEPARTMENT_OTHER): Payer: Self-pay | Admitting: *Deleted

## 2015-06-14 ENCOUNTER — Encounter (HOSPITAL_BASED_OUTPATIENT_CLINIC_OR_DEPARTMENT_OTHER): Payer: Self-pay | Admitting: *Deleted

## 2015-06-14 NOTE — Progress Notes (Signed)
NPO AFTER MN.  ARRIVE AT 0600.  NEEDS ISTAT AND EKG.  WILL TAKE PRILOSEC, CELEXA, LOPRESSOR, AND DIOVAN AM DOS W/ SIPS OF WATER.

## 2015-06-15 DIAGNOSIS — G4733 Obstructive sleep apnea (adult) (pediatric): Secondary | ICD-10-CM | POA: Diagnosis not present

## 2015-06-20 NOTE — Anesthesia Preprocedure Evaluation (Addendum)
Anesthesia Evaluation  Patient identified by MRN, date of birth, ID band Patient awake    Reviewed: Allergy & Precautions, NPO status , Patient's Chart, lab work & pertinent test results  History of Anesthesia Complications Negative for: history of anesthetic complications  Airway Mallampati: III  TM Distance: >3 FB Neck ROM: Full    Dental no notable dental hx. (+) Dental Advisory Given   Pulmonary asthma , sleep apnea and Continuous Positive Airway Pressure Ventilation ,    Pulmonary exam normal breath sounds clear to auscultation       Cardiovascular hypertension (Took beta blocker this am), Pt. on medications and Pt. on home beta blockers Normal cardiovascular exam Rhythm:Regular Rate:Normal     Neuro/Psych PSYCHIATRIC DISORDERS Anxiety Depression negative neurological ROS  negative psych ROS   GI/Hepatic Neg liver ROS, GERD  Medicated and Controlled,  Endo/Other  Hypothyroidism   Renal/GU Renal disease  negative genitourinary   Musculoskeletal negative musculoskeletal ROS (+)   Abdominal   Peds negative pediatric ROS (+)  Hematology negative hematology ROS (+)   Anesthesia Other Findings   Reproductive/Obstetrics negative OB ROS                            Anesthesia Physical Anesthesia Plan  ASA: II  Anesthesia Plan: General   Post-op Pain Management:    Induction: Intravenous  Airway Management Planned: LMA  Additional Equipment:   Intra-op Plan:   Post-operative Plan: Extubation in OR  Informed Consent: I have reviewed the patients History and Physical, chart, labs and discussed the procedure including the risks, benefits and alternatives for the proposed anesthesia with the patient or authorized representative who has indicated his/her understanding and acceptance.   Dental advisory given  Plan Discussed with: CRNA  Anesthesia Plan Comments:         Anesthesia Quick Evaluation

## 2015-06-21 ENCOUNTER — Other Ambulatory Visit: Payer: Self-pay

## 2015-06-21 ENCOUNTER — Ambulatory Visit (HOSPITAL_BASED_OUTPATIENT_CLINIC_OR_DEPARTMENT_OTHER): Payer: Medicare Other | Admitting: Anesthesiology

## 2015-06-21 ENCOUNTER — Encounter (HOSPITAL_BASED_OUTPATIENT_CLINIC_OR_DEPARTMENT_OTHER): Payer: Self-pay

## 2015-06-21 ENCOUNTER — Ambulatory Visit (HOSPITAL_BASED_OUTPATIENT_CLINIC_OR_DEPARTMENT_OTHER)
Admission: RE | Admit: 2015-06-21 | Discharge: 2015-06-21 | Disposition: A | Discharge status: Home / Self Care | Payer: Medicare Other | Source: Ambulatory Visit | Attending: Urology | Admitting: Urology

## 2015-06-21 ENCOUNTER — Encounter (HOSPITAL_BASED_OUTPATIENT_CLINIC_OR_DEPARTMENT_OTHER)
Admission: RE | Disposition: A | Discharge status: Home / Self Care | Payer: Self-pay | Source: Ambulatory Visit | Attending: Urology

## 2015-06-21 DIAGNOSIS — E039 Hypothyroidism, unspecified: Secondary | ICD-10-CM | POA: Diagnosis not present

## 2015-06-21 DIAGNOSIS — M1990 Unspecified osteoarthritis, unspecified site: Secondary | ICD-10-CM | POA: Insufficient documentation

## 2015-06-21 DIAGNOSIS — I10 Essential (primary) hypertension: Secondary | ICD-10-CM | POA: Insufficient documentation

## 2015-06-21 DIAGNOSIS — J45909 Unspecified asthma, uncomplicated: Secondary | ICD-10-CM | POA: Insufficient documentation

## 2015-06-21 DIAGNOSIS — N179 Acute kidney failure, unspecified: Secondary | ICD-10-CM | POA: Insufficient documentation

## 2015-06-21 DIAGNOSIS — I499 Cardiac arrhythmia, unspecified: Secondary | ICD-10-CM | POA: Insufficient documentation

## 2015-06-21 DIAGNOSIS — G4730 Sleep apnea, unspecified: Secondary | ICD-10-CM | POA: Diagnosis not present

## 2015-06-21 DIAGNOSIS — F419 Anxiety disorder, unspecified: Secondary | ICD-10-CM | POA: Insufficient documentation

## 2015-06-21 DIAGNOSIS — Z7951 Long term (current) use of inhaled steroids: Secondary | ICD-10-CM | POA: Diagnosis not present

## 2015-06-21 DIAGNOSIS — N201 Calculus of ureter: Secondary | ICD-10-CM | POA: Diagnosis not present

## 2015-06-21 DIAGNOSIS — F329 Major depressive disorder, single episode, unspecified: Secondary | ICD-10-CM | POA: Diagnosis not present

## 2015-06-21 DIAGNOSIS — K219 Gastro-esophageal reflux disease without esophagitis: Secondary | ICD-10-CM | POA: Diagnosis not present

## 2015-06-21 DIAGNOSIS — N200 Calculus of kidney: Secondary | ICD-10-CM | POA: Diagnosis not present

## 2015-06-21 DIAGNOSIS — Z79899 Other long term (current) drug therapy: Secondary | ICD-10-CM | POA: Diagnosis not present

## 2015-06-21 DIAGNOSIS — N132 Hydronephrosis with renal and ureteral calculous obstruction: Secondary | ICD-10-CM | POA: Insufficient documentation

## 2015-06-21 DIAGNOSIS — E7800 Pure hypercholesterolemia, unspecified: Secondary | ICD-10-CM | POA: Diagnosis not present

## 2015-06-21 DIAGNOSIS — N1330 Unspecified hydronephrosis: Secondary | ICD-10-CM | POA: Diagnosis present

## 2015-06-21 HISTORY — DX: Dependence on other enabling machines and devices: Z99.89

## 2015-06-21 HISTORY — PX: CYSTOSCOPY WITH RETROGRADE PYELOGRAM, URETEROSCOPY AND STENT PLACEMENT: SHX5789

## 2015-06-21 HISTORY — DX: Unspecified asthma, uncomplicated: J45.909

## 2015-06-21 HISTORY — PX: HOLMIUM LASER APPLICATION: SHX5852

## 2015-06-21 HISTORY — DX: Presence of spectacles and contact lenses: Z97.3

## 2015-06-21 HISTORY — DX: Frequency of micturition: R35.0

## 2015-06-21 HISTORY — DX: Obstructive sleep apnea (adult) (pediatric): G47.33

## 2015-06-21 HISTORY — PX: STONE EXTRACTION WITH BASKET: SHX5318

## 2015-06-21 HISTORY — DX: Urgency of urination: R39.15

## 2015-06-21 LAB — POCT I-STAT 4, (NA,K, GLUC, HGB,HCT)
Glucose, Bld: 101 mg/dL — ABNORMAL HIGH (ref 65–99)
HCT: 36 % (ref 36.0–46.0)
Hemoglobin: 12.2 g/dL (ref 12.0–15.0)
POTASSIUM: 3.9 mmol/L (ref 3.5–5.1)
SODIUM: 138 mmol/L (ref 135–145)

## 2015-06-21 SURGERY — CYSTOURETEROSCOPY, WITH RETROGRADE PYELOGRAM AND STENT INSERTION
Anesthesia: General | Site: Ureter | Laterality: Left

## 2015-06-21 MED ORDER — ACETAMINOPHEN 10 MG/ML IV SOLN
INTRAVENOUS | Status: DC | PRN
  Administered 2015-06-21: 1000 mg via INTRAVENOUS

## 2015-06-21 MED ORDER — FENTANYL CITRATE (PF) 100 MCG/2ML IJ SOLN
25.0000 ug | INTRAMUSCULAR | Status: DC | PRN
  Filled 2015-06-21: qty 1

## 2015-06-21 MED ORDER — FENTANYL CITRATE (PF) 100 MCG/2ML IJ SOLN
INTRAMUSCULAR | Status: DC | PRN
  Administered 2015-06-21: 25 ug via INTRAVENOUS
  Administered 2015-06-21: 50 ug via INTRAVENOUS
  Administered 2015-06-21: 25 ug via INTRAVENOUS

## 2015-06-21 MED ORDER — HYDROCODONE-ACETAMINOPHEN 5-325 MG PO TABS: 1.0000 | ORAL_TABLET | ORAL | Status: DC | PRN

## 2015-06-21 MED ORDER — CIPROFLOXACIN HCL 500 MG PO TABS: 500.0000 mg | ORAL_TABLET | Freq: Two times a day (BID) | ORAL | Status: DC

## 2015-06-21 MED ORDER — CIPROFLOXACIN IN D5W 400 MG/200ML IV SOLN
400.0000 mg | INTRAVENOUS | Status: AC
  Administered 2015-06-21: 400 mg via INTRAVENOUS
  Filled 2015-06-21: qty 200

## 2015-06-21 MED ORDER — TAMSULOSIN HCL 0.4 MG PO CAPS: 0.4000 mg | ORAL_CAPSULE | Freq: Every day | ORAL | Status: DC

## 2015-06-21 MED ORDER — EPHEDRINE SULFATE 50 MG/ML IJ SOLN
INTRAMUSCULAR | Status: DC | PRN
  Administered 2015-06-21 (×2): 10 mg via INTRAVENOUS

## 2015-06-21 MED ORDER — LACTATED RINGERS IV SOLN
INTRAVENOUS | Status: DC
  Administered 2015-06-21: 07:00:00 via INTRAVENOUS
  Filled 2015-06-21: qty 1000

## 2015-06-21 MED ORDER — ONDANSETRON HCL 4 MG/2ML IJ SOLN
4.0000 mg | Freq: Once | INTRAMUSCULAR | Status: DC | PRN
  Filled 2015-06-21: qty 2

## 2015-06-21 MED ORDER — MENTHOL 3 MG MT LOZG
LOZENGE | OROMUCOSAL | Status: AC
  Filled 2015-06-21: qty 9

## 2015-06-21 MED ORDER — ALBUTEROL SULFATE HFA 108 (90 BASE) MCG/ACT IN AERS
INHALATION_SPRAY | RESPIRATORY_TRACT | Status: DC | PRN
  Administered 2015-06-21: 2 via RESPIRATORY_TRACT

## 2015-06-21 MED ORDER — GLYCOPYRROLATE 0.2 MG/ML IJ SOLN
INTRAMUSCULAR | Status: DC | PRN
  Administered 2015-06-21: 0.2 mg via INTRAVENOUS

## 2015-06-21 MED ORDER — MIDAZOLAM HCL 2 MG/2ML IJ SOLN
INTRAMUSCULAR | Status: AC
  Filled 2015-06-21: qty 2

## 2015-06-21 MED ORDER — IOHEXOL 350 MG/ML SOLN
INTRAVENOUS | Status: DC | PRN
  Administered 2015-06-21: 25 mL

## 2015-06-21 MED ORDER — ONDANSETRON HCL 4 MG/2ML IJ SOLN
INTRAMUSCULAR | Status: DC | PRN
  Administered 2015-06-21: 4 mg via INTRAVENOUS

## 2015-06-21 MED ORDER — DEXAMETHASONE SODIUM PHOSPHATE 4 MG/ML IJ SOLN
INTRAMUSCULAR | Status: DC | PRN
  Administered 2015-06-21: 10 mg via INTRAVENOUS

## 2015-06-21 MED ORDER — KETOROLAC TROMETHAMINE 30 MG/ML IJ SOLN
INTRAMUSCULAR | Status: DC | PRN
  Administered 2015-06-21: 15 mg via INTRAVENOUS

## 2015-06-21 MED ORDER — FENTANYL CITRATE (PF) 100 MCG/2ML IJ SOLN
INTRAMUSCULAR | Status: AC
Start: 2015-06-21 — End: 2015-06-21
  Filled 2015-06-21: qty 4

## 2015-06-21 MED ORDER — LIDOCAINE HCL (CARDIAC) 20 MG/ML IV SOLN
INTRAVENOUS | Status: DC | PRN
  Administered 2015-06-21: 75 mg via INTRAVENOUS

## 2015-06-21 MED ORDER — PROPOFOL 10 MG/ML IV BOLUS
INTRAVENOUS | Status: DC | PRN
  Administered 2015-06-21: 200 mg via INTRAVENOUS

## 2015-06-21 MED ORDER — CIPROFLOXACIN IN D5W 400 MG/200ML IV SOLN
INTRAVENOUS | Status: AC
  Filled 2015-06-21: qty 200

## 2015-06-21 MED ORDER — SODIUM CHLORIDE 0.9 % IR SOLN
Status: DC | PRN
  Administered 2015-06-21: 3000 mL via INTRAVESICAL

## 2015-06-21 SURGICAL SUPPLY — 16 items
BAG URO CATCHER STRL LF (DRAPE) ×3 IMPLANT
BASKET ZERO TIP NITINOL 2.4FR (BASKET) ×3 IMPLANT
CATH URET 5FR 28IN OPEN ENDED (CATHETERS) ×3 IMPLANT
FIBER LASER FLEXIVA 365 (UROLOGICAL SUPPLIES) ×3 IMPLANT
FIBER LASER TRAC TIP (UROLOGICAL SUPPLIES) IMPLANT
GLOVE BIO SURGEON STRL SZ7 (GLOVE) ×6 IMPLANT
GLOVE BIO SURGEON STRL SZ7.5 (GLOVE) ×3 IMPLANT
GOWN STRL REUS W/ TWL LRG LVL3 (GOWN DISPOSABLE) ×2 IMPLANT
GOWN STRL REUS W/ TWL XL LVL3 (GOWN DISPOSABLE) ×2 IMPLANT
GOWN STRL REUS W/TWL LRG LVL3 (GOWN DISPOSABLE) ×1
GOWN STRL REUS W/TWL XL LVL3 (GOWN DISPOSABLE) ×1
GUIDEWIRE STR DUAL SENSOR (WIRE) ×3 IMPLANT
IV NS IRRIG 3000ML ARTHROMATIC (IV SOLUTION) ×6 IMPLANT
MANIFOLD NEPTUNE II (INSTRUMENTS) ×3 IMPLANT
PACK CYSTOSCOPY (CUSTOM PROCEDURE TRAY) ×3 IMPLANT
STENT URET 6FRX26 CONTOUR (STENTS) ×3 IMPLANT

## 2015-06-21 NOTE — Discharge Instructions (Signed)

## 2015-06-21 NOTE — H&P (Signed)
Chief Complaint Left hydronephrosis    Referring provider: Dr. Marylou Flesher   History of Present Illness 69 y.o. old female with no GU history presents for left hydronephrosis. This was found during work up for elevated Cr. (1.68 in July 2016 and 1.12 in June 2016). She has no symptoms. She denies flank pain, nausea, and vomiting. She does note frequency and nocturia x2. She denies history of stones and hematuria.   Past Medical History Problems  1. History of Anxiety (F41.9) 2. History of arthritis (Z87.39) 3. History of asthma (Z87.09) 4. History of cardiac arrhythmia (Z86.79) 5. History of depression (Z86.59) 6. History of esophageal reflux (Z87.19) 7. History of hypercholesterolemia (Z86.39) 8. History of hypertension (Z86.79) 9. History of sleep apnea (Z87.09)  Surgical History Problems  1. History of No Surgical Problems  Current Meds 1. Atorvastatin Calcium 40 MG Oral Tablet;  Therapy: (Recorded:19Sep2016) to Recorded 2. Citalopram Hydrobromide 40 MG Oral Tablet;  Therapy: (Recorded:19Sep2016) to Recorded 3. Metoprolol Tartrate 100 MG Oral Tablet;  Therapy: (Recorded:19Sep2016) to Recorded 4. Montelukast Sodium 10 MG Oral Tablet;  Therapy: (Recorded:19Sep2016) to Recorded 5. Omeprazole 20 MG Oral Capsule Delayed Release;  Therapy: (Recorded:19Sep2016) to Recorded 6. ProAir HFA AERS;  Therapy: (Recorded:19Sep2016) to Recorded 7. Qnasl 80 MCG/ACT Nasal Aerosol Solution;  Therapy: (Recorded:19Sep2016) to Recorded 8. Valsartan 320 MG Oral Tablet;  Therapy: (Recorded:19Sep2016) to Recorded  Allergies Medication  1. No Known Drug Allergies  Family History Problems  1. Family history of hypertension (Z82.49) : Mother, Brother  Social History Problems    Denied: History of Alcohol use   Caffeine use (F15.90)   Married   Never a smoker   Number of children   Retired  Review of Systems Genitourinary, constitutional, skin, eye, otolaryngeal,  hematologic/lymphatic, cardiovascular, pulmonary, endocrine, musculoskeletal, gastrointestinal, neurological and psychiatric system(s) were reviewed and pertinent findings if present are noted and are otherwise negative.  Genitourinary: urinary frequency and nocturia.  Gastrointestinal: nausea, vomiting, heartburn and diarrhea.  Integumentary: pruritus.  Eyes: blurred vision.  ENT: sinus problems.  Respiratory: shortness of breath and cough.  Endocrine: polydipsia.  Psychiatric: depression and anxiety.    Vitals Vital Signs [Data Includes: Last 1 Day]  Recorded: 19Sep2016 11:11AM  Height: 5 ft 5 in Weight: 174 lb  BMI Calculated: 28.96 BSA Calculated: 1.86 Blood Pressure: 154 / 82 Temperature: 97.8 F Heart Rate: 54  Physical Exam Constitutional: Well nourished . No acute distress.  ENT:. The ears and nose are normal in appearance.  Neck: The appearance of the neck is normal.  Pulmonary: No respiratory distress.  Cardiovascular:. No peripheral edema.  Abdomen: The abdomen is not distended. The abdomen is soft and nontender. No CVA tenderness.  Genitourinary:. Deferred.   Skin: Normal skin turgor.  Neuro/Psych:. Mood and affect are appropriate.    Results/Data Urine [Data Includes: Last 1 Day]   [id number]  COLOR YELLOW   APPEARANCE CLEAR   SPECIFIC GRAVITY 1.010   pH 6.5   GLUCOSE NEGATIVE   BILIRUBIN NEGATIVE   KETONE NEGATIVE   BLOOD NEGATIVE   PROTEIN NEGATIVE   NITRITE NEGATIVE   LEUKOCYTE ESTERASE 2+   SQUAMOUS EPITHELIAL/HPF 0-5 HPF  WBC 0-5 WBC/HPF  RBC 0-2 RBC/HPF  BACTERIA FEW HPF  CRYSTALS NONE SEEN HPF  CASTS NONE SEEN LPF  Yeast NONE SEEN HPF        Procedure CT: Left hydroureteronephrosis 2/2 1 cm distal stone     Assessment 1. Left hydronephrosis 2/2 nephrolithiasis (distal ureter    2. AKI 2/2  above     I discussed with the patient the risks, benefits, and alternatives to surgery. The patient is aware that this may take more than  one operation if her ureter is narrow. She is also aware that there may be a need for a nephrostomy tube/antegrade stent placement if the stone is unable to be passed in a retrograde fashion.   Plan Health Maintenance  1. UA With REFLEX; [Do Not Release]; Status:Complete;   Done: [id number] 10:54AM Hydronephrosis, left  2. Follow-up Schedule Surgery Office  Follow-up for surgery in OR  Status: Hold For -  Appointment  Requested for: 19Sep2016 3. AU CT-STONE PROTOCOL; Status:In Progress - Specimen/Data Collected;   Done:  [id number] 11:46AM Nephrolithiasis  4. URINE CULTURE; Status:In Progress - Specimen/Data Collected;   Done: [id number]  -OR for cystoscopy, left ureteroscopy, laser lithotripsy   Discussion/Summary CC: Dr. Marylou Flesher, Dr. Garnet Koyanagi   Signatures Electronically signed by : Baruch Gouty, M.D.; Jun 07 2015 12:20PM EST

## 2015-06-21 NOTE — Anesthesia Procedure Notes (Signed)
Procedure Name: LMA Insertion Date/Time: 06/21/2015 7:34 AM Performed by: Bethena Roys T Pre-anesthesia Checklist: Patient identified, Emergency Drugs available, Suction available and Patient being monitored Patient Re-evaluated:Patient Re-evaluated prior to inductionOxygen Delivery Method: Circle System Utilized Preoxygenation: Pre-oxygenation with 100% oxygen Intubation Type: IV induction Ventilation: Mask ventilation without difficulty LMA: LMA with gastric port inserted LMA Size: 5.0 Number of attempts: 1 Placement Confirmation: positive ETCO2 Tube secured with: Tape Dental Injury: Teeth and Oropharynx as per pre-operative assessment

## 2015-06-21 NOTE — Anesthesia Postprocedure Evaluation (Signed)
  Anesthesia Post-op Note  Patient: Wendy Christensen  Procedure(s) Performed: Procedure(s) (LRB): CYSTOSCOPY WITH RETROGRADE PYELOGRAM, URETEROSCOPY AND STENT PLACEMENT (Left) HOLMIUM LASER APPLICATION (Left) STONE EXTRACTION WITH BASKET (Left)  Patient Location: PACU  Anesthesia Type: General  Level of Consciousness: awake and alert   Airway and Oxygen Therapy: Patient Spontanous Breathing  Post-op Pain: mild  Post-op Assessment: Post-op Vital signs reviewed, Patient's Cardiovascular Status Stable, Respiratory Function Stable, Patent Airway and No signs of Nausea or vomiting  Last Vitals:  Filed Vitals:   06/21/15 0915  BP: 169/84  Pulse: 73  Temp:   Resp: 13    Post-op Vital Signs: stable   Complications: No apparent anesthesia complications

## 2015-06-21 NOTE — Op Note (Signed)
Preoperative diagnosis: Left ureteral calculus, left hydronephrosis  Postoperative diagnosis: Same  Procedure: Cystoscopy: Left retrograde pyelogram with interpretation, left ureteroscopy, laser lithotripsy, stimulation, left ureteral stent placement (6 [place] by 26 cm)  Surgeon: Dr. Pilar Jarvis  Drains: 6 French by 26 cm double-J ureteral stent  Retrograde pyelogram: On Initial retrograde pyelogram Contrast was unable to pass past the distal stone. At the end of the procedure after lithotripsy, retrograde program showed hydroureteronephrosis with proper stent placement in the case.  Disposition: Stable to postanesthesia care unit  Indications for procedure: The patient is a 69 year old female who was incidentally found have hydroureteronephrosis. On further workup she was found to have a distal 1.2 cm stone. She since today for further workup.  Description of procedure: The patient was met in the preoperative area. All risks benefits and indications the procedure described in great detail. The patient consented to the procedure. Preoperative antibiotics were given. The patient was then taken back to the operative theater. Gen. anesthesia was induced per anesthesia service. Patient was placed in the dorsal lithotomy position prepped and draped in usual sterile fashion. A timeout was called. A 21 French 30 cystoscope was inserted into the patient's bladder per urethra atraumatically. Pan cystoscopy was unremarkable. The left ureteral orifice was visualized. A opening catheter was placed. Retrograde pyelogram was obtained. This showed contrast unable to pass the stone. A wire however was easily passed under fluoroscopy to the left renal pelvis. The cystoscope was withdrawn. Ureteroscope was a somewhat certain patient's bladder and into the left ureteral orifice. The stone was visualized. Laser lithotripsy was then used to break the stone is small fragments. The fracture was then removed with the 0 tip  basket. All major fragments were removed. Due to the of her ureter, we can increase the difficult to intubate the left ureteral orifice. Retrograde powder was obtained through the ureteroscope there is withdrawn. The retrograde program showed now open left ureter with hydroureteronephrosis. There were some small fragments left in the ureter. None were deemed to be large. The cystoscope was then reassembled. A 6 [place] 6 [place] by 26 cm stent was then placed over the sensor wire. Sensor wire withdrawn. A correct placement was confirmed to curl seen in the patient's renal pelvis with fluoroscopy. Correct distal placement was seen to curl seen in the patient's urinary bladder under direct visualization. Stone fragment was then evacuated. Patient was then woken from anesthesia and transferred stable condition to the post care unit. Next  Plan: The patient will follow up in 1-2 weeks. At that time we will obtain a KUB x-ray to ensure no large fragments remaining in the ureter prior to cystoscopic stent removal.

## 2015-06-21 NOTE — Transfer of Care (Signed)
Immediate Anesthesia Transfer of Care Note  Patient: Wendy Christensen  Procedure(s) Performed: Procedure(s): CYSTOSCOPY WITH RETROGRADE PYELOGRAM, URETEROSCOPY AND STENT PLACEMENT (Left) HOLMIUM LASER APPLICATION (Left)  Patient Location: PACU  Anesthesia Type:General  Level of Consciousness: awake, alert  and oriented  Airway & Oxygen Therapy: Patient Spontanous Breathing and Patient connected to nasal cannula oxygen  Post-op Assessment: Report given to RN  Post vital signs: Reviewed and stable  Last Vitals:  Filed Vitals:   06/21/15 0608  BP: 159/92  Pulse: 54  Temp: 36.9 C  Resp: 16    Complications: No apparent anesthesia complications

## 2015-06-22 ENCOUNTER — Encounter (HOSPITAL_BASED_OUTPATIENT_CLINIC_OR_DEPARTMENT_OTHER): Payer: Self-pay | Admitting: Urology

## 2015-06-29 DIAGNOSIS — N200 Calculus of kidney: Secondary | ICD-10-CM | POA: Diagnosis not present

## 2015-06-30 ENCOUNTER — Ambulatory Visit: Payer: Medicare Other

## 2015-07-05 ENCOUNTER — Other Ambulatory Visit: Payer: Self-pay

## 2015-07-05 DIAGNOSIS — Z1231 Encounter for screening mammogram for malignant neoplasm of breast: Secondary | ICD-10-CM

## 2015-07-07 ENCOUNTER — Ambulatory Visit (INDEPENDENT_AMBULATORY_CARE_PROVIDER_SITE_OTHER): Payer: Medicare Other

## 2015-07-07 VITALS — BP 126/70 | HR 61 | Ht 63.75 in | Wt 172.0 lb

## 2015-07-07 DIAGNOSIS — I10 Essential (primary) hypertension: Secondary | ICD-10-CM

## 2015-07-07 DIAGNOSIS — Z0000 Encounter for general adult medical examination without abnormal findings: Secondary | ICD-10-CM | POA: Diagnosis not present

## 2015-07-07 DIAGNOSIS — Z23 Encounter for immunization: Secondary | ICD-10-CM | POA: Diagnosis not present

## 2015-07-07 MED ORDER — VALSARTAN 320 MG PO TABS: 320.0000 mg | ORAL_TABLET | Freq: Every day | ORAL | Status: DC

## 2015-07-07 NOTE — Patient Instructions (Addendum)
Schedule PAP smear with Dr. [name].  Complete Mammogram as scheduled.  Follow up with providers as scheduled.  Continue to eat heart healthy diet and exercise.    Fat and Cholesterol Restricted Diet Getting too much fat and cholesterol in your diet may cause health problems. Following this diet helps keep your fat and cholesterol at normal levels. This can keep you from getting sick. WHAT TYPES OF FAT SHOULD I CHOOSE?  Choose monosaturated and polyunsaturated fats. These are found in foods such as olive oil, canola oil, flaxseeds, walnuts, almonds, and seeds.  Eat more omega-3 fats. Good choices include salmon, mackerel, sardines, tuna, flaxseed oil, and ground flaxseeds.  Limit saturated fats. These are in animal products such as meats, butter, and cream. They can also be in plant products such as palm oil, palm kernel oil, and coconut oil.   Avoid foods with partially hydrogenated oils in them. These contain trans fats. Examples of foods that have trans fats are stick margarine, some tub margarines, cookies, crackers, and other baked goods. WHAT GENERAL GUIDELINES DO I NEED TO FOLLOW?   Check food labels. Look for the words "trans fat" and "saturated fat."  When preparing a meal:  Fill half of your plate with vegetables and green salads.  Fill one fourth of your plate with whole grains. Look for the word "whole" as the first word in the ingredient list.  Fill one fourth of your plate with lean protein foods.  Limit fruit to two servings a day. Choose fruit instead of juice.  Eat more foods with soluble fiber. Examples of foods with this type of fiber are apples, broccoli, carrots, beans, peas, and barley. Try to get 20-30 g (grams) of fiber per day.  Eat more home-cooked foods. Eat less at restaurants and buffets.  Limit or avoid alcohol.  Limit foods high in starch and sugar.  Limit fried foods.  Cook foods without frying them. Baking, boiling, grilling, and broiling are  all great options.  Lose weight if you are overweight. Losing even a small amount of weight can help your overall health. It can also help prevent diseases such as diabetes and heart disease. WHAT FOODS CAN I EAT? Grains Whole grains, such as whole wheat or whole grain breads, crackers, cereals, and pasta. Unsweetened oatmeal, bulgur, barley, quinoa, or brown rice. Corn or whole wheat flour tortillas. Vegetables Fresh or frozen vegetables (raw, steamed, roasted, or grilled). Green salads. Fruits All fresh, canned (in natural juice), or frozen fruits. Meat and Other Protein Products Ground beef (85% or leaner), grass-fed beef, or beef trimmed of fat. Skinless chicken or [place]. Ground chicken or [place]. Pork trimmed of fat. All fish and seafood. Eggs. Dried beans, peas, or lentils. Unsalted nuts or seeds. Unsalted canned or dry beans. Dairy Low-fat dairy products, such as skim or 1% milk, 2% or reduced-fat cheeses, low-fat ricotta or cottage cheese, or plain low-fat yogurt. Fats and Oils Tub margarines without trans fats. Light or reduced-fat mayonnaise and salad dressings. Avocado. Olive, canola, sesame, or safflower oils. Natural peanut or almond butter (choose ones without added sugar and oil). The items listed above may not be a complete list of recommended foods or beverages. Contact your dietitian for more options. WHAT FOODS ARE NOT RECOMMENDED? Grains White bread. White pasta. White rice. Cornbread. Bagels, pastries, and croissants. Crackers that contain trans fat. Vegetables White potatoes. Corn. Creamed or fried vegetables. Vegetables in a cheese sauce. Fruits Dried fruits. Canned fruit in light or heavy syrup. Fruit juice.  Meat and Other Protein Products Fatty cuts of meat. Ribs, chicken wings, bacon, sausage, bologna, salami, chitterlings, fatback, hot dogs, bratwurst, and packaged luncheon meats. Liver and organ meats. Dairy Whole or 2% milk, cream, half-and-half, and cream  cheese. Whole milk cheeses. Whole-fat or sweetened yogurt. Full-fat cheeses. Nondairy creamers and whipped toppings. Processed cheese, cheese spreads, or cheese curds. Sweets and Desserts Corn syrup, sugars, honey, and molasses. Candy. Jam and jelly. Syrup. Sweetened cereals. Cookies, pies, cakes, donuts, muffins, and ice cream. Fats and Oils Butter, stick margarine, lard, shortening, ghee, or bacon fat. Coconut, palm kernel, or palm oils. Beverages Alcohol. Sweetened drinks (such as sodas, lemonade, and fruit drinks or punches). The items listed above may not be a complete list of foods and beverages to avoid. Contact your dietitian for more information.   This information is not intended to replace advice given to you by your health care provider. Make sure you discuss any questions you have with your health care provider.   Document Released: 03/05/2012 Document Revised: 09/25/2014 Document Reviewed: 12/04/2013 Elsevier Interactive Patient Education 2016 [place] in the Home  Falls can cause injuries. They can happen to people of all ages. There are many things you can do to make your home safe and to help prevent falls.  WHAT CAN I DO ON THE OUTSIDE OF MY HOME?  Regularly fix the edges of walkways and driveways and fix any cracks.  Remove anything that might make you trip as you walk through a door, such as a raised step or threshold.  Trim any bushes or trees on the path to your home.  Use bright outdoor lighting.  Clear any walking paths of anything that might make someone trip, such as rocks or tools.  Regularly check to see if handrails are loose or broken. Make sure that both sides of any steps have handrails.  Any raised decks and porches should have guardrails on the edges.  Have any leaves, snow, or ice cleared regularly.  Use sand or salt on walking paths during winter.  Clean up any spills in your garage right away. This includes oil or grease  spills. WHAT CAN I DO IN THE BATHROOM?   Use night lights.  Install grab bars by the toilet and in the tub and shower. Do not use towel bars as grab bars.  Use non-skid mats or decals in the tub or shower.  If you need to sit down in the shower, use a plastic, non-slip stool.  Keep the floor dry. Clean up any water that spills on the floor as soon as it happens.  Remove soap buildup in the tub or shower regularly.  Attach bath mats securely with double-sided non-slip rug tape.  Do not have throw rugs and other things on the floor that can make you trip. WHAT CAN I DO IN THE BEDROOM?  Use night lights.  Make sure that you have a light by your bed that is easy to reach.  Do not use any sheets or blankets that are too big for your bed. They should not hang down onto the floor.  Have a firm chair that has side arms. You can use this for support while you get dressed.  Do not have throw rugs and other things on the floor that can make you trip. WHAT CAN I DO IN THE KITCHEN?  Clean up any spills right away.  Avoid walking on wet floors.  Keep items that you use a lot  in easy-to-reach places.  If you need to reach something above you, use a strong step stool that has a grab bar.  Keep electrical cords out of the way.  Do not use floor polish or wax that makes floors slippery. If you must use wax, use non-skid floor wax.  Do not have throw rugs and other things on the floor that can make you trip. WHAT CAN I DO WITH MY STAIRS?  Do not leave any items on the stairs.  Make sure that there are handrails on both sides of the stairs and use them. Fix handrails that are broken or loose. Make sure that handrails are as long as the stairways.  Check any carpeting to make sure that it is firmly attached to the stairs. Fix any carpet that is loose or worn.  Avoid having throw rugs at the top or bottom of the stairs. If you do have throw rugs, attach them to the floor with carpet  tape.  Make sure that you have a light switch at the top of the stairs and the bottom of the stairs. If you do not have them, ask someone to add them for you. WHAT ELSE CAN I DO TO HELP PREVENT FALLS?  Wear shoes that:  Do not have high heels.  Have rubber bottoms.  Are comfortable and fit you well.  Are closed at the toe. Do not wear sandals.  If you use a stepladder:  Make sure that it is fully opened. Do not climb a closed stepladder.  Make sure that both sides of the stepladder are locked into place.  Ask someone to hold it for you, if possible.  Clearly mark and make sure that you can see:  Any grab bars or handrails.  First and last steps.  Where the edge of each step is.  Use tools that help you move around (mobility aids) if they are needed. These include:  Canes.  Walkers.  Scooters.  Crutches.  Turn on the lights when you go into a dark area. Replace any light bulbs as soon as they burn out.  Set up your furniture so you have a clear path. Avoid moving your furniture around.  If any of your floors are uneven, fix them.  If there are any pets around you, be aware of where they are.  Review your medicines with your doctor. Some medicines can make you feel dizzy. This can increase your chance of falling. Ask your doctor what other things that you can do to help prevent falls.   This information is not intended to replace advice given to you by your health care provider. Make sure you discuss any questions you have with your health care provider.   Document Released: 07/01/2009 Document Revised: 01/19/2015 Document Reviewed: 10/09/2014 Elsevier Interactive Patient Education 2016 [place] A mammogram is an X-ray of the breasts that is done to check for abnormal changes. This procedure can screen for and detect any changes that may suggest breast cancer. A mammogram can also identify other changes and variations in the breast, such  as:  Inflammation of the breast tissue (mastitis).  An infected area that contains a collection of pus (abscess).  A fluid-filled sac (cyst).  Fibrocystic changes. This is when breast tissue becomes denser, which can make the tissue feel rope-like or uneven under the skin.  Tumors that are not cancerous (benign). LET [facility] CARE PROVIDER KNOW ABOUT:  Any allergies you have.  If you have breast  implants.  If you have had previous breast disease, biopsy, or surgery.  If you are breastfeeding.  Any possibility that you could be pregnant, if this applies.  If you are younger than age 104.  If you have a family history of breast cancer. RISKS AND COMPLICATIONS Generally, this is a safe procedure. However, problems may occur, including:  Exposure to radiation. Radiation levels are very low with this test.  The results being misinterpreted.  The need for further tests.  The inability of the mammogram to detect certain cancers. BEFORE THE PROCEDURE  Schedule your test about 1-2 weeks after your menstrual period. This is usually when your breasts are the least tender.  If you have had a mammogram done at a different facility in the past, get the mammogram X-rays or have them sent to your current exam facility in order to compare them.  Wash your breasts and under your arms the day of the test.  Do not wear deodorants, perfumes, lotions, or powders anywhere on your body on the day of the test.  Remove any jewelry from your neck.  Wear clothes that you can change into and out of easily. PROCEDURE  You will undress from the waist up and put on a gown.  You will stand in front of the X-ray machine.  Each breast will be placed between two plastic or glass plates. The plates will compress your breast for a few seconds. Try to stay as relaxed as possible during the procedure. This does not cause any harm to your breasts and any discomfort you feel will be very  brief.  X-rays will be taken from different angles of each breast. The procedure may vary among health care providers and hospitals. AFTER THE PROCEDURE  The mammogram will be examined by a specialist (radiologist).  You may need to repeat certain parts of the test, depending on the quality of the images. This is commonly done if the radiologist needs a better view of the breast tissue.  Ask when your test results will be ready. Make sure you get your test results.  You may resume your normal activities.   This information is not intended to replace advice given to you by your health care provider. Make sure you discuss any questions you have with your health care provider.   Document Released: 09/01/2000 Document Revised: 05/26/2015 Document Reviewed: 11/13/2014 Elsevier Interactive Patient Education [employer].

## 2015-07-07 NOTE — Progress Notes (Signed)
Pre visit review using our clinic review tool, if applicable. No additional management support is needed unless otherwise documented below in the visit note. 

## 2015-07-07 NOTE — Progress Notes (Signed)
Subjective:   Wendy Christensen is a 69 y.o. female who presents for Medicare Annual (Subsequent) preventive examination.  Review of Systems: No ROS  Cardiac Risk Factors include: advanced age (>37men, >47 women);hypertension  Sleep patterns:  Wears CPAP.  Sleeps at least 6 hours per night.   Home Safety/Smoke Alarms:  Feels safe at home.  Lives with husband in a one level home.  Smoke alarms present.  Data processing manager present. Alarm system present.  Firearm Safety:  No firearms  Seat Belt Safety/Bike Helmet:  Always wears seat belt Sun exposure:  Discussed skin cancer prevention.   Counseling:   Eye Exam- 05/2015 Dental- Last exam 2 years ago. Plans to schedule an appointment. Dr. Marcello Moores Female:  Pap- Plans to schedule with Dr. [name].     Mammo- 07/27/14-negative Dexa scan-01/21/09- low bone mass.   CCS- 11/14/11-1 polyp and diverticulosis; repeat in 5 years    Objective:     Vitals: BP 126/70 mmHg  Pulse 61  Ht 5' 3.75" (1.619 m)  Wt 172 lb (78.019 kg)  BMI 29.77 kg/m2  SpO2 97%  Tobacco History  Smoking status  . Never Smoker   Smokeless tobacco  . Never Used     Counseling given: Not Answered   Past Medical History  Diagnosis Date  . GERD (gastroesophageal reflux disease)   . Hypertension   . Hyperlipidemia   . Anxiety   . Allergic rhinitis   . Depression   . Thyroid goiter   . Asthma, intrinsic   . Frequency of urination   . Urgency of urination   . Wears glasses   . OSA on CPAP     PT IN PROCESS OF GETTING MACHINE SET UP  . Kidney stone     Left kidney-calcium    Past Surgical History  Procedure Laterality Date  . Tubal ligation  1980's  . Dilation and curettage of uterus  1990    W/ HYSTEROSCOPY  . Cystoscopy with retrograde pyelogram, ureteroscopy and stent placement Left 06/21/2015    Procedure: CYSTOSCOPY WITH RETROGRADE PYELOGRAM, URETEROSCOPY AND STENT PLACEMENT;  Surgeon: Nickie Retort, MD;  Location: [facility];   Service: Urology;  Laterality: Left;  . Holmium laser application Left 63/04/7563    Procedure: HOLMIUM LASER APPLICATION;  Surgeon: Nickie Retort, MD;  Location: [facility];  Service: Urology;  Laterality: Left;  . Stone extraction with basket Left 06/21/2015    Procedure: STONE EXTRACTION WITH BASKET;  Surgeon: Nickie Retort, MD;  Location: [facility];  Service: Urology;  Laterality: Left;   Family History  Problem Relation Age of Onset  . Hypertension Mother   . COPD Father   . Hyperlipidemia    . Hypertension    . Asthma Father   . Heart disease Mother   . Kidney cancer Maternal Aunt    History  Sexual Activity  . Sexual Activity:  . Partners: Male    Comment: Husband     Outpatient Encounter Prescriptions as of 07/07/2015  Medication Sig  . acetaminophen (TYLENOL) 325 MG tablet Take 650 mg by mouth every 6 (six) hours as needed.  Marland Kitchen albuterol (PROVENTIL HFA;VENTOLIN HFA) 108 (90 BASE) MCG/ACT inhaler Inhale 2 puffs into the lungs every 4 (four) hours as needed for wheezing.  Marland Kitchen atorvastatin (LIPITOR) 40 MG tablet TAKE 1 TABLET BY MOUTH DAILY (Patient taking differently: TAKE 1 TABLET BY MOUTH DAILY--  TAKES IN PM)  . citalopram (CELEXA) 40 MG tablet  Take 40 mg by mouth every morning.   . fenofibrate 160 MG tablet TAKE 1 TABLET BY MOUTH DAILY (Patient taking differently: TAKE 1 TABLET BY MOUTH DAILY--  TAKES IN PM)  . FLOVENT HFA 110 MCG/ACT inhaler Inhale 2 puffs into the lungs every morning. Inhale 2 puffs by mouth daily.  . fluticasone (FLONASE) 50 MCG/ACT nasal spray Place 2 sprays into both nostrils as needed.  . metoprolol (LOPRESSOR) 100 MG tablet Take 1 tablet (100 mg total) by mouth 2 (two) times daily.  . montelukast (SINGULAIR) 10 MG tablet Take 1 tablet by mouth at bedtime.  . Multiple Vitamins-Minerals (MULTIVITAMIN WITH MINERALS) tablet Take 1 tablet by mouth daily.  Marland Kitchen omeprazole (PRILOSEC) 20 MG capsule Take 1 capsule (20 mg  total) by mouth daily. (Patient taking differently: Take 20 mg by mouth every morning. )  . tamsulosin (FLOMAX) 0.4 MG CAPS capsule Take 1 capsule (0.4 mg total) by mouth daily.  . valsartan (DIOVAN) 320 MG tablet Take 1 tablet (320 mg total) by mouth daily.  . vitamin B-12 (CYANOCOBALAMIN) 1000 MCG tablet Take 1,000 mcg by mouth daily.  . [DISCONTINUED] valsartan (DIOVAN) 320 MG tablet Take 1 tablet (320 mg total) by mouth daily. (Patient taking differently: Take 320 mg by mouth every morning. )  . gentamicin (GARAMYCIN) 0.3 % ophthalmic solution PLACE 1 DROP INTO THE LEFT EYE QID BEGINNING 24 HOURS AFTER SURGERY  . HYDROcodone-acetaminophen (NORCO) 5-325 MG tablet Take 1 tablet by mouth every 4 (four) hours as needed for moderate pain. (Patient not taking: Reported on 07/07/2015)  . [DISCONTINUED] ciprofloxacin (CIPRO) 500 MG tablet Take 1 tablet (500 mg total) by mouth 2 (two) times daily. (Patient not taking: Reported on 07/07/2015)   No facility-administered encounter medications on file as of 07/07/2015.    Activities of Daily Living In your present state of health, do you have any difficulty performing the following activities: 07/07/2015 06/21/2015  Hearing? N N  Vision? N N  Difficulty concentrating or making decisions? N N  Walking or climbing stairs? N N  Dressing or bathing? N N  Doing errands, shopping? N -  Preparing Food and eating ? N -  Using the Toilet? N -  In the past six months, have you accidently leaked urine? Y -  Do you have problems with loss of bowel control? N -  Managing your Medications? N -  Managing your Finances? N -  Housekeeping or managing your Housekeeping? N -    Patient Care Team: Rosalita Chessman, DO as PCP - General Inda Castle, MD as Consulting Physician (Gastroenterology) Rigoberto Noel, MD as Consulting Physician (Pulmonary Disease) Renato Shin, MD as Consulting Physician (Endocrinology) Rexene Agent, MD as Attending Physician  (Nephrology) Nickie Retort, MD as Consulting Physician (Urology) Mosetta Anis, MD as Referring Physician (Allergy)  Dr. Marcello Moores- Dentistry Dr. Sabra Heck- Opthalmology    Assessment:  Essential hypertension- stable on medications.  Encouraged heart healthy diet and exercise.  Followed by Dr. [name].  Hyperlipidemia- Last Lipid Panel- 03/23/15- WNL, pt encouraged to limit saturated and trans fats, and exercise.    OSA- Wears CPAP at night.  Sleeps better.  Following with Dr. Shannan Harper.    Anxiety depression- Controlled with citalopram.  Asymptomatic today.  Followed by Dr. [name].   Exercise Activities and Dietary recommendations Current Exercise Habits:: Home exercise routine, Type of exercise: walking, Time (Minutes): 20, Frequency (Times/Week): > 6, Weekly Exercise (Minutes/Week): 0, Intensity: Moderate   Diet: Breakfast-plain waffle and  coffee, Snack- 1/2 apple, cubes cheese, peanuts  Lunch-Sandwich; yogurt, Dinner- 1 meat, 1 vegetable, 1 starch.  1/2 apple before bed.  Fluids- water, gatorade and iced sweet tea.    Goals    . Keep blood pressure below 130/80    . Keep cholesterol within normal range      Fall Risk Fall Risk  07/07/2015 09/22/2013 12/16/2012  Falls in the past year? No No No  Number falls in past yr: 2 or more - -  Injury with Fall? No - -  Follow up Education provided;Falls prevention discussed - -   Depression Screen PHQ 2/9 Scores 07/07/2015 09/22/2013  PHQ - 2 Score 0 0     Cognitive Testing MMSE - Mini Mental State Exam 07/07/2015  Orientation to time 5  Orientation to Place 5  Registration 3  Attention/ Calculation 5  Recall 2  Language- name 2 objects 2  Language- repeat 1  Language- follow 3 step command 3  Language- read & follow direction 1  Write a sentence 1  Copy design 1  Total score 29    Immunization History  Administered Date(s) Administered  . Influenza Split 05/29/2012, 06/17/2013  . Influenza Whole 07/31/2007  .  Influenza, High Dose Seasonal PF 07/02/2015  . Influenza-Unspecified 08/07/2014  . Pneumococcal Conjugate-13 07/07/2015  . Pneumococcal Polysaccharide-23 12/16/2012  . Td 04/18/2005  . Zoster 04/24/2008   Screening Tests Health Maintenance  Topic Date Due  . Hepatitis C Screening  August 04, 1946  . TETANUS/TDAP  04/19/2015  . INFLUENZA VACCINE  04/18/2016  . MAMMOGRAM  07/27/2016  . COLONOSCOPY  11/13/2016  . DEXA SCAN  Completed  . ZOSTAVAX  Completed  . PNA vac Low Risk Adult  Completed      Plan:  Schedule PAP smear with Dr. [name]. Complete Mammogram as scheduled. Follow up with providers as scheduled. Continue to eat heart healthy diet and exercise.     During the course of the visit the patient was educated and counseled about the following appropriate screening and preventive services:   Vaccines to include Pneumoccal, Influenza, Hepatitis B, Td, Zostavax, HCV  Electrocardiogram  Cardiovascular Disease  Colorectal cancer screening  Bone density screening  Diabetes screening  Glaucoma screening  Mammography/PAP  Nutrition counseling   Patient Instructions (the written plan) was given to the patient.   Rudene Anda, RN  07/07/2015

## 2015-07-13 ENCOUNTER — Encounter: Payer: Self-pay | Admitting: Family Medicine

## 2015-07-13 ENCOUNTER — Ambulatory Visit (INDEPENDENT_AMBULATORY_CARE_PROVIDER_SITE_OTHER): Payer: Medicare Other | Admitting: Family Medicine

## 2015-07-13 VITALS — BP 140/76 | HR 58 | Temp 99.2°F | Wt 171.4 lb

## 2015-07-13 DIAGNOSIS — I10 Essential (primary) hypertension: Secondary | ICD-10-CM | POA: Diagnosis not present

## 2015-07-13 MED ORDER — METOPROLOL TARTRATE 100 MG PO TABS: 100.0000 mg | ORAL_TABLET | Freq: Two times a day (BID) | ORAL | Status: DC

## 2015-07-13 NOTE — Patient Instructions (Signed)
Hypertension Hypertension, commonly called high blood pressure, is when the force of blood pumping through your arteries is too strong. Your arteries are the blood vessels that carry blood from your heart throughout your body. A blood pressure reading consists of a higher number over a lower number, such as 110/72. The higher number (systolic) is the pressure inside your arteries when your heart pumps. The lower number (diastolic) is the pressure inside your arteries when your heart relaxes. Ideally you want your blood pressure below 120/80. Hypertension forces your heart to work harder to pump blood. Your arteries may become narrow or stiff. Having untreated or uncontrolled hypertension can cause heart attack, stroke, kidney disease, and other problems. RISK FACTORS Some risk factors for high blood pressure are controllable. Others are not.  Risk factors you cannot control include:   Race. You may be at higher risk if you are African American.  Age. Risk increases with age.  Gender. Men are at higher risk than women before age 45 years. After age 65, women are at higher risk than men. Risk factors you can control include:  Not getting enough exercise or physical activity.  Being overweight.  Getting too much fat, sugar, calories, or salt in your diet.  Drinking too much alcohol. SIGNS AND SYMPTOMS Hypertension does not usually cause signs or symptoms. Extremely high blood pressure (hypertensive crisis) may cause headache, anxiety, shortness of breath, and nosebleed. DIAGNOSIS To check if you have hypertension, your health care provider will measure your blood pressure while you are seated, with your arm held at the level of your heart. It should be measured at least twice using the same arm. Certain conditions can cause a difference in blood pressure between your right and left arms. A blood pressure reading that is higher than normal on one occasion does not mean that you need treatment. If  it is not clear whether you have high blood pressure, you may be asked to return on a different day to have your blood pressure checked again. Or, you may be asked to monitor your blood pressure at home for 1 or more weeks. TREATMENT Treating high blood pressure includes making lifestyle changes and possibly taking medicine. Living a healthy lifestyle can help lower high blood pressure. You may need to change some of your habits. Lifestyle changes may include:  Following the DASH diet. This diet is high in fruits, vegetables, and whole grains. It is low in salt, red meat, and added sugars.  Keep your sodium intake below 2,300 mg per day.  Getting at least 30-45 minutes of aerobic exercise at least 4 times per week.  Losing weight if necessary.  Not smoking.  Limiting alcoholic beverages.  Learning ways to reduce stress. Your health care provider may prescribe medicine if lifestyle changes are not enough to get your blood pressure under control, and if one of the following is true:  You are 18-59 years of age and your systolic blood pressure is above 140.  You are 60 years of age or older, and your systolic blood pressure is above 150.  Your diastolic blood pressure is above 90.  You have diabetes, and your systolic blood pressure is over 140 or your diastolic blood pressure is over 90.  You have kidney disease and your blood pressure is above 140/90.  You have heart disease and your blood pressure is above 140/90. Your personal target blood pressure may vary depending on your medical conditions, your age, and other factors. HOME CARE INSTRUCTIONS    Have your blood pressure rechecked as directed by your health care provider.   Take medicines only as directed by your health care provider. Follow the directions carefully. Blood pressure medicines must be taken as prescribed. The medicine does not work as well when you skip doses. Skipping doses also puts you at risk for  problems.  Do not smoke.   Monitor your blood pressure at home as directed by your health care provider. SEEK MEDICAL CARE IF:   You think you are having a reaction to medicines taken.  You have recurrent headaches or feel dizzy.  You have swelling in your ankles.  You have trouble with your vision. SEEK IMMEDIATE MEDICAL CARE IF:  You develop a severe headache or confusion.  You have unusual weakness, numbness, or feel faint.  You have severe chest or abdominal pain.  You vomit repeatedly.  You have trouble breathing. MAKE SURE YOU:   Understand these instructions.  Will watch your condition.  Will get help right away if you are not doing well or get worse.   This information is not intended to replace advice given to you by your health care provider. Make sure you discuss any questions you have with your health care provider.   Document Released: 09/04/2005 Document Revised: 01/19/2015 Document Reviewed: 06/27/2013 Elsevier Interactive Patient Education 2016 [employer]  

## 2015-07-13 NOTE — Progress Notes (Signed)
Patient ID: Wendy Christensen, female    DOB: 1945-10-25  Age: 69 y.o. MRN: [id number]    Subjective:  Subjective HPI [name] presents for f/u bp.   No complaints.     Review of Systems  Constitutional: Negative for diaphoresis, appetite change, fatigue and unexpected weight change.  Eyes: Negative for pain, redness and visual disturbance.  Respiratory: Negative for cough, chest tightness, shortness of breath and wheezing.   Cardiovascular: Negative for chest pain, palpitations and leg swelling.  Endocrine: Negative for cold intolerance, heat intolerance, polydipsia, polyphagia and polyuria.  Genitourinary: Negative for dysuria, frequency and difficulty urinating.  Neurological: Negative for dizziness, light-headedness, numbness and headaches.  Psychiatric/Behavioral: Negative for dysphoric mood and decreased concentration. The patient is not nervous/anxious.     History Past Medical History  Diagnosis Date  . GERD (gastroesophageal reflux disease)   . Hypertension   . Hyperlipidemia   . Anxiety   . Allergic rhinitis   . Depression   . Thyroid goiter   . Asthma, intrinsic   . Frequency of urination   . Urgency of urination   . Wears glasses   . OSA on CPAP     PT IN PROCESS OF GETTING MACHINE SET UP  . Kidney stone     Left kidney-calcium     She has past surgical history that includes Tubal ligation (1980's); Dilation and curettage of uterus (1990); Cystoscopy with retrograde pyelogram, ureteroscopy and stent placement (Left, 06/21/2015); Holmium laser application (Left, 60/02/3015); and Stone extraction with basket (Left, 06/21/2015).   Her family history includes Asthma in her father; COPD in her father; Heart disease in her mother; Hyperlipidemia in an other family member; Hypertension in her mother and another family member; Kidney cancer in her maternal aunt.She reports that she has never smoked. She has never used smokeless tobacco. She reports that she  drinks alcohol. She reports that she does not use illicit drugs.  Current Outpatient Prescriptions on File Prior to Visit  Medication Sig Dispense Refill  . acetaminophen (TYLENOL) 325 MG tablet Take 650 mg by mouth every 6 (six) hours as needed.    Marland Kitchen albuterol (PROVENTIL HFA;VENTOLIN HFA) 108 (90 BASE) MCG/ACT inhaler Inhale 2 puffs into the lungs every 4 (four) hours as needed for wheezing. 1 Inhaler 0  . atorvastatin (LIPITOR) 40 MG tablet TAKE 1 TABLET BY MOUTH DAILY (Patient taking differently: TAKE 1 TABLET BY MOUTH DAILY--  TAKES IN PM) 30 tablet 5  . citalopram (CELEXA) 40 MG tablet Take 40 mg by mouth every morning.     . fenofibrate 160 MG tablet TAKE 1 TABLET BY MOUTH DAILY (Patient taking differently: TAKE 1 TABLET BY MOUTH DAILY--  TAKES IN PM) 30 tablet 5  . FLOVENT HFA 110 MCG/ACT inhaler Inhale 2 puffs into the lungs every morning. Inhale 2 puffs by mouth daily.  3  . fluticasone (FLONASE) 50 MCG/ACT nasal spray Place 2 sprays into both nostrils as needed.  3  . gentamicin (GARAMYCIN) 0.3 % ophthalmic solution PLACE 1 DROP INTO THE LEFT EYE QID BEGINNING 24 HOURS AFTER SURGERY  0  . HYDROcodone-acetaminophen (NORCO) 5-325 MG tablet Take 1 tablet by mouth every 4 (four) hours as needed for moderate pain. 30 tablet 0  . montelukast (SINGULAIR) 10 MG tablet Take 1 tablet by mouth at bedtime.    . Multiple Vitamins-Minerals (MULTIVITAMIN WITH MINERALS) tablet Take 1 tablet by mouth daily.    Marland Kitchen omeprazole (PRILOSEC) 20 MG capsule Take 1 capsule (20 mg  total) by mouth daily. (Patient taking differently: Take 20 mg by mouth every morning. ) 90 capsule 3  . tamsulosin (FLOMAX) 0.4 MG CAPS capsule Take 1 capsule (0.4 mg total) by mouth daily. 30 capsule 0  . valsartan (DIOVAN) 320 MG tablet Take 1 tablet (320 mg total) by mouth daily. 30 tablet 2  . vitamin B-12 (CYANOCOBALAMIN) 1000 MCG tablet Take 1,000 mcg by mouth daily.     No current facility-administered medications on file prior to  visit.     Objective:  Objective Physical Exam  Constitutional: She is oriented to person, place, and time. She appears well-developed and well-nourished.  HENT:  Head: Normocephalic and atraumatic.  Eyes: Conjunctivae and EOM are normal.  Neck: Normal range of motion. Neck supple. No JVD present. Carotid bruit is not present. No thyromegaly present.  Cardiovascular: Normal rate, regular rhythm and normal heart sounds.   No murmur heard. Pulmonary/Chest: Effort normal and breath sounds normal. No respiratory distress. She has no wheezes. She has no rales. She exhibits no tenderness.  Musculoskeletal: She exhibits no edema.  Neurological: She is alert and oriented to person, place, and time.  Psychiatric: She has a normal mood and affect. Her behavior is normal. Judgment and thought content normal.  Nursing note and vitals reviewed.  BP 140/76 mmHg  Pulse 58  Temp(Src) 99.2 F (37.3 C) (Oral)  Wt 171 lb 6.4 oz (77.747 kg)  SpO2 96% Wt Readings from Last 3 Encounters:  07/13/15 171 lb 6.4 oz (77.747 kg)  07/07/15 172 lb (78.019 kg)  06/21/15 170 lb (77.111 kg)     Lab Results  Component Value Date   WBC 6.4 03/23/2015   HGB 12.2 06/21/2015   HCT 36.0 06/21/2015   PLT 312.0 03/23/2015   GLUCOSE 101* 06/21/2015   CHOL 159 03/23/2015   TRIG 86.0 03/23/2015   HDL 45.60 03/23/2015   LDLDIRECT 215.7 10/18/2011   LDLCALC 96 03/23/2015   ALT 9 03/23/2015   AST 14 03/23/2015   NA 138 06/21/2015   K 3.9 06/21/2015   CL 101 03/23/2015   CREATININE 1.68* 03/23/2015   BUN 22 03/23/2015   CO2 31 03/23/2015   TSH 1.51 09/22/2013   HGBA1C 6.4 09/14/2014   MICROALBUR <0.7 03/23/2015    No results found.   Assessment & Plan:  Plan I am having Ms. Richrd Sox maintain her multivitamin with minerals, citalopram, vitamin B-12, albuterol, fenofibrate, montelukast, fluticasone, FLOVENT HFA, omeprazole, atorvastatin, acetaminophen, HYDROcodone-acetaminophen, tamsulosin,  gentamicin, valsartan, and metoprolol.  Meds ordered this encounter  Medications  . metoprolol (LOPRESSOR) 100 MG tablet    Sig: Take 1 tablet (100 mg total) by mouth 2 (two) times daily.    Dispense:  180 tablet    Refill:  1    Problem List Items Addressed This Visit    Essential hypertension - Primary   Relevant Medications   metoprolol (LOPRESSOR) 100 MG tablet      Follow-up: Return in about 6 months (around 01/11/2016), or if symptoms worsen or fail to improve, for annual exam, fasting.  Garnet Koyanagi, DO

## 2015-07-13 NOTE — Assessment & Plan Note (Signed)
con't meds per orders Stable rto 3 months

## 2015-07-13 NOTE — Progress Notes (Signed)
Pre visit review using our clinic review tool, if applicable. No additional management support is needed unless otherwise documented below in the visit note. 

## 2015-07-16 ENCOUNTER — Ambulatory Visit: Payer: Self-pay | Admitting: Adult Health

## 2015-07-16 DIAGNOSIS — G4733 Obstructive sleep apnea (adult) (pediatric): Secondary | ICD-10-CM | POA: Diagnosis not present

## 2015-07-16 NOTE — Progress Notes (Signed)
reviewed

## 2015-07-19 ENCOUNTER — Other Ambulatory Visit: Payer: Self-pay

## 2015-07-19 MED ORDER — ATORVASTATIN CALCIUM 40 MG PO TABS: 40.0000 mg | ORAL_TABLET | Freq: Every day | ORAL | Status: DC

## 2015-07-29 ENCOUNTER — Ambulatory Visit
Admission: RE | Admit: 2015-07-29 | Discharge: 2015-07-29 | Disposition: A | Discharge status: Home / Self Care | Payer: Medicare Other | Source: Ambulatory Visit

## 2015-07-29 DIAGNOSIS — Z1231 Encounter for screening mammogram for malignant neoplasm of breast: Secondary | ICD-10-CM

## 2015-08-09 DIAGNOSIS — N1330 Unspecified hydronephrosis: Secondary | ICD-10-CM | POA: Diagnosis not present

## 2015-08-15 DIAGNOSIS — G4733 Obstructive sleep apnea (adult) (pediatric): Secondary | ICD-10-CM | POA: Diagnosis not present

## 2015-08-16 DIAGNOSIS — R7989 Other specified abnormal findings of blood chemistry: Secondary | ICD-10-CM | POA: Diagnosis not present

## 2015-08-16 DIAGNOSIS — N1330 Unspecified hydronephrosis: Secondary | ICD-10-CM | POA: Diagnosis not present

## 2015-08-16 DIAGNOSIS — N200 Calculus of kidney: Secondary | ICD-10-CM | POA: Diagnosis not present

## 2015-08-16 DIAGNOSIS — I10 Essential (primary) hypertension: Secondary | ICD-10-CM | POA: Diagnosis not present

## 2015-08-16 LAB — BASIC METABOLIC PANEL
BUN: 19 mg/dL (ref 4–21)
Creatinine: 1.4 mg/dL — AB (ref 0.5–1.1)
Glucose: 105 mg/dL
POTASSIUM: 3.7 mmol/L (ref 3.4–5.3)
SODIUM: 138 mmol/L (ref 137–147)

## 2015-08-17 DIAGNOSIS — G4733 Obstructive sleep apnea (adult) (pediatric): Secondary | ICD-10-CM | POA: Diagnosis not present

## 2015-08-25 ENCOUNTER — Encounter: Payer: Self-pay | Admitting: Family Medicine

## 2015-09-14 DIAGNOSIS — G4733 Obstructive sleep apnea (adult) (pediatric): Secondary | ICD-10-CM | POA: Diagnosis not present

## 2015-09-24 ENCOUNTER — Ambulatory Visit (INDEPENDENT_AMBULATORY_CARE_PROVIDER_SITE_OTHER): Payer: Medicare Other | Admitting: Acute Care

## 2015-09-24 ENCOUNTER — Other Ambulatory Visit: Payer: Self-pay | Admitting: Acute Care

## 2015-09-24 ENCOUNTER — Encounter: Payer: Self-pay | Admitting: Acute Care

## 2015-09-24 VITALS — BP 138/76 | HR 53 | Temp 98.1°F | Ht 65.0 in | Wt 175.8 lb

## 2015-09-24 DIAGNOSIS — J019 Acute sinusitis, unspecified: Secondary | ICD-10-CM | POA: Diagnosis not present

## 2015-09-24 DIAGNOSIS — G4733 Obstructive sleep apnea (adult) (pediatric): Secondary | ICD-10-CM

## 2015-09-24 MED ORDER — DOXYCYCLINE HYCLATE 100 MG PO TABS: 100.0000 mg | ORAL_TABLET | Freq: Two times a day (BID) | ORAL | Status: DC

## 2015-09-24 NOTE — Assessment & Plan Note (Signed)
Continue on CPAP at bedtime. You appear to be benefiting from the treatment.  Your night time apnea events have dropped from 18 per hour to 4.9 per hour. Goal is to wear for at least 4-6 hours every night for maximal clinical benefit. Continue to work on weight loss, as the link between excess weight  and sleep apnea is well established.  Do not drive if sleepy. Follow up with Dr.Alva In 3 months or before as needed.  Please contact office for sooner follow up if symptoms do not improve or worsen or seek emergency care.

## 2015-09-24 NOTE — Assessment & Plan Note (Addendum)
Doxycycline 100 mg BID for 7 days for your sinus infection today. Call if you do not improve, or go to your allergist. Continue using Mucinex as needed

## 2015-09-24 NOTE — Progress Notes (Signed)
Subjective:    Patient ID: Wendy Christensen, female    DOB: 11-23-1945, 70 y.o.   MRN: [id number]  HPI  70 year old woman with OSA who was started on CPAP after sleep study indicated AHI of 17.9. Pt. Continues on CPAP at set pressure of 10 cm H2O and returns to office today for follow up:  Significant Studies:  Sleep Study 03/26/15: AHI: 17.9 Desat to 89% Titration Study: 03/2015  No Desaturations Download 07/09/15-08/07/2015: AHI : 4.9 Compliance : 70% Usage days: 23/30 Average Usage: days used 6 hours 13 minutes.  09/24/15: OV CPAP follow up: Pt . Is using CPAP with 70% Compliance. She is less sleepy and feels well rested.She is clearly benefiting clinically from the treatment. She is currently experiencing green sinus drainage and PND. Understands she needs to use CPAP every night for at least 4-6 hours. Past Medical History  Diagnosis Date  . GERD (gastroesophageal reflux disease)   . Hypertension   . Hyperlipidemia   . Anxiety   . Allergic rhinitis   . Depression   . Thyroid goiter   . Asthma, intrinsic   . Frequency of urination   . Urgency of urination   . Wears glasses   . OSA on CPAP     PT IN PROCESS OF GETTING MACHINE SET UP  . Kidney stone     Left kidney-calcium    Current Outpatient Prescriptions on File Prior to Visit  Medication Sig Dispense Refill  . acetaminophen (TYLENOL) 325 MG tablet Take 650 mg by mouth every 6 (six) hours as needed.    Marland Kitchen albuterol (PROVENTIL HFA;VENTOLIN HFA) 108 (90 BASE) MCG/ACT inhaler Inhale 2 puffs into the lungs every 4 (four) hours as needed for wheezing. 1 Inhaler 0  . atorvastatin (LIPITOR) 40 MG tablet Take 1 tablet (40 mg total) by mouth daily. 90 tablet 1  . citalopram (CELEXA) 40 MG tablet Take 40 mg by mouth every morning.     . fenofibrate 160 MG tablet TAKE 1 TABLET BY MOUTH DAILY (Patient taking differently: TAKE 1 TABLET BY MOUTH DAILY--  TAKES IN PM) 30 tablet 5  . FLOVENT HFA 110 MCG/ACT inhaler Inhale 2  puffs into the lungs daily as needed.   3  . fluticasone (FLONASE) 50 MCG/ACT nasal spray Place 2 sprays into both nostrils as needed.  3  . metoprolol (LOPRESSOR) 100 MG tablet Take 1 tablet (100 mg total) by mouth 2 (two) times daily. 180 tablet 1  . montelukast (SINGULAIR) 10 MG tablet Take 1 tablet by mouth at bedtime.    . Multiple Vitamins-Minerals (MULTIVITAMIN WITH MINERALS) tablet Take 1 tablet by mouth daily.    Marland Kitchen omeprazole (PRILOSEC) 20 MG capsule Take 1 capsule (20 mg total) by mouth daily. (Patient taking differently: Take 20 mg by mouth every morning. ) 90 capsule 3  . valsartan (DIOVAN) 320 MG tablet Take 1 tablet (320 mg total) by mouth daily. 30 tablet 2  . vitamin B-12 (CYANOCOBALAMIN) 1000 MCG tablet Take 1,000 mcg by mouth daily.    Marland Kitchen gentamicin (GARAMYCIN) 0.3 % ophthalmic solution Reported on 09/24/2015  0   No current facility-administered medications on file prior to visit.   No Known Allergies  Review of Systems Constitutional:   No  weight loss, night sweats,  Fevers, chills, fatigue, or  lassitude.  HEENT:   No headaches,  Difficulty swallowing,  Tooth/dental problems, or  Sore throat,  No sneezing, itching, ear ache, +nasal congestion, + post nasal drip,   CV:  No chest pain,  Orthopnea, PND, swelling in lower extremities, anasarca, dizziness, palpitations, syncope.   GI  No heartburn, indigestion, abdominal pain, nausea, vomiting, diarrhea, change in bowel habits, loss of appetite, bloody stools.   Resp: No shortness of breath with exertion or at rest.  No excess mucus, no productive cough,  No non-productive cough,  No coughing up of blood.  + change in color of mucus.  No wheezing.  No chest wall deformity  Skin: no rash or lesions.  GU: no dysuria, change in color of urine, no urgency or frequency.  No flank pain, no hematuria   MS:  No joint pain or swelling.  No decreased range of motion.  No back pain.  Psych:  No change in mood or  affect. No depression or anxiety.  No memory loss.         Objective:   Physical Exam  BP 138/76 mmHg  Pulse 53  Temp(Src) 98.1 F (36.7 C) (Oral)  Ht 5\' 5"  (1.651 m)  Wt 175 lb 12.8 oz (79.742 kg)  BMI 29.25 kg/m2  SpO2 99%  Physical Exam:  General- No distress,  A&Ox3 ENT: No sinus tenderness, TM clear, irritated nasal mucosa, no oral exudate, + post nasal drip,+ yellow green drainage ,no LAN Cardiac: S1, S2, regular rate and rhythm, no murmur Chest: No wheeze/ rales/ dullness; no accessory muscle use, no nasal flaring, no sternal retractions Abd.: Soft Non-tender Ext: No edema Neuro:  normal strength Skin: No rashes, warm and dry Psych: normal mood and behavior       Assessment & Plan:

## 2015-09-24 NOTE — Patient Instructions (Addendum)
   Continue on CPAP at bedtime. You appear to be benefiting from the treatment.  Your night time apnea events have dropped from 18 per hour to 4.9 per hour. Goal is to wear for at least 4-6 hours every night for maximal clinical benefit. Continue to work on weight loss, as the link between excess weight  and sleep apnea is well established.  Do not drive if sleepy. Doxycycline 100 mg BID for 7 days for your sinus infection today. Call if you do not improve, or go to your allergist. Follow up with Dr.Alva In 3 months or before as needed.  Please contact office for sooner follow up if symptoms do not improve or worsen or seek emergency care.

## 2015-09-26 NOTE — Progress Notes (Signed)
Reviewed & agree with plan  

## 2015-10-06 DIAGNOSIS — G4733 Obstructive sleep apnea (adult) (pediatric): Secondary | ICD-10-CM | POA: Diagnosis not present

## 2015-10-11 DIAGNOSIS — H25811 Combined forms of age-related cataract, right eye: Secondary | ICD-10-CM | POA: Diagnosis not present

## 2015-10-11 DIAGNOSIS — H25812 Combined forms of age-related cataract, left eye: Secondary | ICD-10-CM | POA: Diagnosis not present

## 2015-10-15 ENCOUNTER — Ambulatory Visit (INDEPENDENT_AMBULATORY_CARE_PROVIDER_SITE_OTHER): Payer: Medicare Other | Admitting: Family Medicine

## 2015-10-15 ENCOUNTER — Encounter: Payer: Self-pay | Admitting: Family Medicine

## 2015-10-15 VITALS — BP 157/76 | HR 52 | Temp 98.6°F | Ht 65.0 in | Wt 176.4 lb

## 2015-10-15 DIAGNOSIS — Z0000 Encounter for general adult medical examination without abnormal findings: Secondary | ICD-10-CM | POA: Diagnosis not present

## 2015-10-15 DIAGNOSIS — R634 Abnormal weight loss: Secondary | ICD-10-CM | POA: Diagnosis not present

## 2015-10-15 DIAGNOSIS — I10 Essential (primary) hypertension: Secondary | ICD-10-CM

## 2015-10-15 DIAGNOSIS — E785 Hyperlipidemia, unspecified: Secondary | ICD-10-CM

## 2015-10-15 DIAGNOSIS — F411 Generalized anxiety disorder: Secondary | ICD-10-CM | POA: Diagnosis not present

## 2015-10-15 DIAGNOSIS — G4733 Obstructive sleep apnea (adult) (pediatric): Secondary | ICD-10-CM | POA: Diagnosis not present

## 2015-10-15 DIAGNOSIS — Z1159 Encounter for screening for other viral diseases: Secondary | ICD-10-CM | POA: Diagnosis not present

## 2015-10-15 LAB — LIPID PANEL
CHOL/HDL RATIO: 7
Cholesterol: 348 mg/dL — ABNORMAL HIGH (ref 0–200)
HDL: 47.1 mg/dL (ref >39.00)
LDL Cholesterol: 274 mg/dL — ABNORMAL HIGH (ref 0–99)
NonHDL: 300.77
TRIGLYCERIDES: 135 mg/dL (ref 0.0–149.0)
VLDL: 27 mg/dL (ref 0.0–40.0)

## 2015-10-15 LAB — HEPATIC FUNCTION PANEL
ALBUMIN: 4 g/dL (ref 3.5–5.2)
ALT: 13 U/L (ref 0–35)
AST: 19 U/L (ref 0–37)
Alkaline Phosphatase: 69 U/L (ref 39–117)
BILIRUBIN DIRECT: 0.1 mg/dL (ref 0.0–0.3)
TOTAL PROTEIN: 7.7 g/dL (ref 6.0–8.3)
Total Bilirubin: 0.3 mg/dL (ref 0.2–1.2)

## 2015-10-15 LAB — CBC WITH DIFFERENTIAL/PLATELET
BASOS PCT: 0.8 % (ref 0.0–3.0)
Basophils Absolute: 0 10*3/uL (ref 0.0–0.1)
EOS ABS: 0.6 10*3/uL (ref 0.0–0.7)
Eosinophils Relative: 9.6 % — ABNORMAL HIGH (ref 0.0–5.0)
HEMATOCRIT: 36.8 % (ref 36.0–46.0)
Hemoglobin: 11.8 g/dL — ABNORMAL LOW (ref 12.0–15.0)
LYMPHS ABS: 1.6 10*3/uL (ref 0.7–4.0)
LYMPHS PCT: 26.1 % (ref 12.0–46.0)
MCHC: 32.1 g/dL (ref 30.0–36.0)
MCV: 83.3 fl (ref 78.0–100.0)
Monocytes Absolute: 0.3 10*3/uL (ref 0.1–1.0)
Monocytes Relative: 5.4 % (ref 3.0–12.0)
NEUTROS ABS: 3.5 10*3/uL (ref 1.4–7.7)
NEUTROS PCT: 58.1 % (ref 43.0–77.0)
PLATELETS: 324 10*3/uL (ref 150.0–400.0)
RBC: 4.41 Mil/uL (ref 3.87–5.11)
RDW: 13.9 % (ref 11.5–15.5)
WBC: 6.1 10*3/uL (ref 4.0–10.5)

## 2015-10-15 MED ORDER — CITALOPRAM HYDROBROMIDE 40 MG PO TABS
40.0000 mg | ORAL_TABLET | Freq: Every morning | ORAL | Status: DC
Start: 2015-10-15 — End: 2016-02-29

## 2015-10-15 NOTE — Progress Notes (Signed)
Pre visit review using our clinic review tool, if applicable. No additional management support is needed unless otherwise documented below in the visit note. 

## 2015-10-15 NOTE — Patient Instructions (Signed)
Preventive Care for Adults, Female A healthy lifestyle and preventive care can promote health and wellness. Preventive health guidelines for women include the following key practices.  A routine yearly physical is a good way to check with your health care provider about your health and preventive screening. It is a chance to share any concerns and updates on your health and to receive a thorough exam.  Visit your dentist for a routine exam and preventive care every 6 months. Brush your teeth twice a day and floss once a day. Good oral hygiene prevents tooth decay and gum disease.  The frequency of eye exams is based on your age, health, family medical history, use of contact lenses, and other factors. Follow your health care provider's recommendations for frequency of eye exams.  Eat a healthy diet. Foods like vegetables, fruits, whole grains, low-fat dairy products, and lean protein foods contain the nutrients you need without too many calories. Decrease your intake of foods high in solid fats, added sugars, and salt. Eat the right amount of calories for you.Get information about a proper diet from your health care provider, if necessary.  Regular physical exercise is one of the most important things you can do for your health. Most adults should get at least 150 minutes of moderate-intensity exercise (any activity that increases your heart rate and causes you to sweat) each week. In addition, most adults need muscle-strengthening exercises on 2 or more days a week.  Maintain a healthy weight. The body mass index (BMI) is a screening tool to identify possible weight problems. It provides an estimate of body fat based on height and weight. Your health care provider can find your BMI and can help you achieve or maintain a healthy weight.For adults 20 years and older:  A BMI below 18.5 is considered underweight.  A BMI of 18.5 to 24.9 is normal.  A BMI of 25 to 29.9 is considered overweight.  A  BMI of 30 and above is considered obese.  Maintain normal blood lipids and cholesterol levels by exercising and minimizing your intake of saturated fat. Eat a balanced diet with plenty of fruit and vegetables. Blood tests for lipids and cholesterol should begin at age 45 and be repeated every 5 years. If your lipid or cholesterol levels are high, you are over 50, or you are at high risk for heart disease, you may need your cholesterol levels checked more frequently.Ongoing high lipid and cholesterol levels should be treated with medicines if diet and exercise are not working.  If you smoke, find out from your health care provider how to quit. If you do not use tobacco, do not start.  Lung cancer screening is recommended for adults aged 45-80 years who are at high risk for developing lung cancer because of a history of smoking. A yearly low-dose CT scan of the lungs is recommended for people who have at least a 30-pack-year history of smoking and are a current smoker or have quit within the past 15 years. A pack year of smoking is smoking an average of 1 pack of cigarettes a day for 1 year (for example: 1 pack a day for 30 years or 2 packs a day for 15 years). Yearly screening should continue until the smoker has stopped smoking for at least 15 years. Yearly screening should be stopped for people who develop a health problem that would prevent them from having lung cancer treatment.  If you are pregnant, do not drink alcohol. If you are  breastfeeding, be very cautious about drinking alcohol. If you are not pregnant and choose to drink alcohol, do not have more than 1 drink per day. One drink is considered to be 12 ounces (355 mL) of beer, 5 ounces (148 mL) of wine, or 1.5 ounces (44 mL) of liquor.  Avoid use of street drugs. Do not share needles with anyone. Ask for help if you need support or instructions about stopping the use of drugs.  High blood pressure causes heart disease and increases the risk  of stroke. Your blood pressure should be checked at least every 1 to 2 years. Ongoing high blood pressure should be treated with medicines if weight loss and exercise do not work.  If you are 55-79 years old, ask your health care provider if you should take aspirin to prevent strokes.  Diabetes screening is done by taking a blood sample to check your blood glucose level after you have not eaten for a certain period of time (fasting). If you are not overweight and you do not have risk factors for diabetes, you should be screened once every 3 years starting at age 45. If you are overweight or obese and you are 40-70 years of age, you should be screened for diabetes every year as part of your cardiovascular risk assessment.  Breast cancer screening is essential preventive care for women. You should practice "breast self-awareness." This means understanding the normal appearance and feel of your breasts and may include breast self-examination. Any changes detected, no matter how small, should be reported to a health care provider. Women in their 20s and 30s should have a clinical breast exam (CBE) by a health care provider as part of a regular health exam every 1 to 3 years. After age 40, women should have a CBE every year. Starting at age 40, women should consider having a mammogram (breast X-ray test) every year. Women who have a family history of breast cancer should talk to their health care provider about genetic screening. Women at a high risk of breast cancer should talk to their health care providers about having an MRI and a mammogram every year.  Breast cancer gene (BRCA)-related cancer risk assessment is recommended for women who have family members with BRCA-related cancers. BRCA-related cancers include breast, ovarian, tubal, and peritoneal cancers. Having family members with these cancers may be associated with an increased risk for harmful changes (mutations) in the breast cancer genes BRCA1 and  BRCA2. Results of the assessment will determine the need for genetic counseling and BRCA1 and BRCA2 testing.  Your health care provider may recommend that you be screened regularly for cancer of the pelvic organs (ovaries, uterus, and vagina). This screening involves a pelvic examination, including checking for microscopic changes to the surface of your cervix (Pap test). You may be encouraged to have this screening done every 3 years, beginning at age 21.  For women ages 30-65, health care providers may recommend pelvic exams and Pap testing every 3 years, or they may recommend the Pap and pelvic exam, combined with testing for human papilloma virus (HPV), every 5 years. Some types of HPV increase your risk of cervical cancer. Testing for HPV may also be done on women of any age with unclear Pap test results.  Other health care providers may not recommend any screening for nonpregnant women who are considered low risk for pelvic cancer and who do not have symptoms. Ask your health care provider if a screening pelvic exam is right for   you.  If you have had past treatment for cervical cancer or a condition that could lead to cancer, you need Pap tests and screening for cancer for at least 20 years after your treatment. If Pap tests have been discontinued, your risk factors (such as having a new sexual partner) need to be reassessed to determine if screening should resume. Some women have medical problems that increase the chance of getting cervical cancer. In these cases, your health care provider may recommend more frequent screening and Pap tests.  Colorectal cancer can be detected and often prevented. Most routine colorectal cancer screening begins at the age of 50 years and continues through age 75 years. However, your health care provider may recommend screening at an earlier age if you have risk factors for colon cancer. On a yearly basis, your health care provider may provide home test kits to check  for hidden blood in the stool. Use of a small camera at the end of a tube, to directly examine the colon (sigmoidoscopy or colonoscopy), can detect the earliest forms of colorectal cancer. Talk to your health care provider about this at age 50, when routine screening begins. Direct exam of the colon should be repeated every 5-10 years through age 75 years, unless early forms of precancerous polyps or small growths are found.  People who are at an increased risk for hepatitis B should be screened for this virus. You are considered at high risk for hepatitis B if:  You were born in a country where hepatitis B occurs often. Talk with your health care provider about which countries are considered high risk.  Your parents were born in a high-risk country and you have not received a shot to protect against hepatitis B (hepatitis B vaccine).  You have HIV or AIDS.  You use needles to inject street drugs.  You live with, or have sex with, someone who has hepatitis B.  You get hemodialysis treatment.  You take certain medicines for conditions like cancer, organ transplantation, and autoimmune conditions.  Hepatitis C blood testing is recommended for all people born from 1945 through 1965 and any individual with known risks for hepatitis C.  Practice safe sex. Use condoms and avoid high-risk sexual practices to reduce the spread of sexually transmitted infections (STIs). STIs include gonorrhea, chlamydia, syphilis, trichomonas, herpes, HPV, and human immunodeficiency virus (HIV). Herpes, HIV, and HPV are viral illnesses that have no cure. They can result in disability, cancer, and death.  You should be screened for sexually transmitted illnesses (STIs) including gonorrhea and chlamydia if:  You are sexually active and are younger than 24 years.  You are older than 24 years and your health care provider tells you that you are at risk for this type of infection.  Your sexual activity has changed  since you were last screened and you are at an increased risk for chlamydia or gonorrhea. Ask your health care provider if you are at risk.  If you are at risk of being infected with HIV, it is recommended that you take a prescription medicine daily to prevent HIV infection. This is called preexposure prophylaxis (PrEP). You are considered at risk if:  You are sexually active and do not regularly use condoms or know the HIV status of your partner(s).  You take drugs by injection.  You are sexually active with a partner who has HIV.  Talk with your health care provider about whether you are at high risk of being infected with HIV. If   you choose to begin PrEP, you should first be tested for HIV. You should then be tested every 3 months for as long as you are taking PrEP.  Osteoporosis is a disease in which the bones lose minerals and strength with aging. This can result in serious bone fractures or breaks. The risk of osteoporosis can be identified using a bone density scan. Women ages 67 years and over and women at risk for fractures or osteoporosis should discuss screening with their health care providers. Ask your health care provider whether you should take a calcium supplement or vitamin D to reduce the rate of osteoporosis.  Menopause can be associated with physical symptoms and risks. Hormone replacement therapy is available to decrease symptoms and risks. You should talk to your health care provider about whether hormone replacement therapy is right for you.  Use sunscreen. Apply sunscreen liberally and repeatedly throughout the day. You should seek shade when your shadow is shorter than you. Protect yourself by wearing long sleeves, pants, a wide-brimmed hat, and sunglasses year round, whenever you are outdoors.  Once a month, do a whole body skin exam, using a mirror to look at the skin on your back. Tell your health care provider of new moles, moles that have irregular borders, moles that  are larger than a pencil eraser, or moles that have changed in shape or color.  Stay current with required vaccines (immunizations).  Influenza vaccine. All adults should be immunized every year.  Tetanus, diphtheria, and acellular pertussis (Td, Tdap) vaccine. Pregnant women should receive 1 dose of Tdap vaccine during each pregnancy. The dose should be obtained regardless of the length of time since the last dose. Immunization is preferred during the 27th-36th week of gestation. An adult who has not previously received Tdap or who does not know her vaccine status should receive 1 dose of Tdap. This initial dose should be followed by tetanus and diphtheria toxoids (Td) booster doses every 10 years. Adults with an unknown or incomplete history of completing a 3-dose immunization series with Td-containing vaccines should begin or complete a primary immunization series including a Tdap dose. Adults should receive a Td booster every 10 years.  Varicella vaccine. An adult without evidence of immunity to varicella should receive 2 doses or a second dose if she has previously received 1 dose. Pregnant females who do not have evidence of immunity should receive the first dose after pregnancy. This first dose should be obtained before leaving the health care facility. The second dose should be obtained 4-8 weeks after the first dose.  Human papillomavirus (HPV) vaccine. Females aged 13-26 years who have not received the vaccine previously should obtain the 3-dose series. The vaccine is not recommended for use in pregnant females. However, pregnancy testing is not needed before receiving a dose. If a female is found to be pregnant after receiving a dose, no treatment is needed. In that case, the remaining doses should be delayed until after the pregnancy. Immunization is recommended for any person with an immunocompromised condition through the age of 61 years if she did not get any or all doses earlier. During the  3-dose series, the second dose should be obtained 4-8 weeks after the first dose. The third dose should be obtained 24 weeks after the first dose and 16 weeks after the second dose.  Zoster vaccine. One dose is recommended for adults aged 30 years or older unless certain conditions are present.  Measles, mumps, and rubella (MMR) vaccine. Adults born  before 1957 generally are considered immune to measles and mumps. Adults born in 1957 or later should have 1 or more doses of MMR vaccine unless there is a contraindication to the vaccine or there is laboratory evidence of immunity to each of the three diseases. A routine second dose of MMR vaccine should be obtained at least 28 days after the first dose for students attending postsecondary schools, health care workers, or international travelers. People who received inactivated measles vaccine or an unknown type of measles vaccine during 1963-1967 should receive 2 doses of MMR vaccine. People who received inactivated mumps vaccine or an unknown type of mumps vaccine before 1979 and are at high risk for mumps infection should consider immunization with 2 doses of MMR vaccine. For females of childbearing age, rubella immunity should be determined. If there is no evidence of immunity, females who are not pregnant should be vaccinated. If there is no evidence of immunity, females who are pregnant should delay immunization until after pregnancy. Unvaccinated health care workers born before 1957 who lack laboratory evidence of measles, mumps, or rubella immunity or laboratory confirmation of disease should consider measles and mumps immunization with 2 doses of MMR vaccine or rubella immunization with 1 dose of MMR vaccine.  Pneumococcal 13-valent conjugate (PCV13) vaccine. When indicated, a person who is uncertain of his immunization history and has no record of immunization should receive the PCV13 vaccine. All adults 65 years of age and older should receive this  vaccine. An adult aged 19 years or older who has certain medical conditions and has not been previously immunized should receive 1 dose of PCV13 vaccine. This PCV13 should be followed with a dose of pneumococcal polysaccharide (PPSV23) vaccine. Adults who are at high risk for pneumococcal disease should obtain the PPSV23 vaccine at least 8 weeks after the dose of PCV13 vaccine. Adults older than 68 years of age who have normal immune system function should obtain the PPSV23 vaccine dose at least 1 year after the dose of PCV13 vaccine.  Pneumococcal polysaccharide (PPSV23) vaccine. When PCV13 is also indicated, PCV13 should be obtained first. All adults aged 65 years and older should be immunized. An adult younger than age 65 years who has certain medical conditions should be immunized. Any person who resides in a nursing home or long-term care facility should be immunized. An adult smoker should be immunized. People with an immunocompromised condition and certain other conditions should receive both PCV13 and PPSV23 vaccines. People with human immunodeficiency virus (HIV) infection should be immunized as soon as possible after diagnosis. Immunization during chemotherapy or radiation therapy should be avoided. Routine use of PPSV23 vaccine is not recommended for American Indians, [place] Natives, or people younger than 65 years unless there are medical conditions that require PPSV23 vaccine. When indicated, people who have unknown immunization and have no record of immunization should receive PPSV23 vaccine. One-time revaccination 5 years after the first dose of PPSV23 is recommended for people aged 19-64 years who have chronic kidney failure, nephrotic syndrome, asplenia, or immunocompromised conditions. People who received 1-2 doses of PPSV23 before age 65 years should receive another dose of PPSV23 vaccine at age 65 years or later if at least 5 years have passed since the previous dose. Doses of PPSV23 are not  needed for people immunized with PPSV23 at or after age 65 years.  Meningococcal vaccine. Adults with asplenia or persistent complement component deficiencies should receive 2 doses of quadrivalent meningococcal conjugate (MenACWY-D) vaccine. The doses should be obtained   at least 2 months apart. Microbiologists working with certain meningococcal bacteria, [place] recruits, people at risk during an outbreak, and people who travel to or live in countries with a high rate of meningitis should be immunized. A first-year college student up through age 34 years who is living in a residence hall should receive a dose if she did not receive a dose on or after her 16th birthday. Adults who have certain high-risk conditions should receive one or more doses of vaccine.  Hepatitis A vaccine. Adults who wish to be protected from this disease, have certain high-risk conditions, work with hepatitis A-infected animals, work in hepatitis A research labs, or travel to or work in countries with a high rate of hepatitis A should be immunized. Adults who were previously unvaccinated and who anticipate close contact with an international adoptee during the first 60 days after arrival in the [place] from a country with a high rate of hepatitis A should be immunized.  Hepatitis B vaccine. Adults who wish to be protected from this disease, have certain high-risk conditions, may be exposed to blood or other infectious body fluids, are household contacts or sex partners of hepatitis B positive people, are clients or workers in certain care facilities, or travel to or work in countries with a high rate of hepatitis B should be immunized.  Haemophilus influenzae type b (Hib) vaccine. A previously unvaccinated person with asplenia or sickle cell disease or having a scheduled splenectomy should receive 1 dose of Hib vaccine. Regardless of previous immunization, a recipient of a hematopoietic stem cell transplant should receive a  3-dose series 6-12 months after her successful transplant. Hib vaccine is not recommended for adults with HIV infection. Preventive Services / Frequency Ages 35 to 4 years  Blood pressure check.** / Every 3-5 years.  Lipid and cholesterol check.** / Every 5 years beginning at age 60.  Clinical breast exam.** / Every 3 years for women in their 71s and 10s.  BRCA-related cancer risk assessment.** / For women who have family members with a BRCA-related cancer (breast, ovarian, tubal, or peritoneal cancers).  Pap test.** / Every 2 years from ages 76 through 26. Every 3 years starting at age 61 through age 76 or 93 with a history of 3 consecutive normal Pap tests.  HPV screening.** / Every 3 years from ages 37 through ages 60 to 51 with a history of 3 consecutive normal Pap tests.  Hepatitis C blood test.** / For any individual with known risks for hepatitis C.  Skin self-exam. / Monthly.  Influenza vaccine. / Every year.  Tetanus, diphtheria, and acellular pertussis (Tdap, Td) vaccine.** / Consult your health care provider. Pregnant women should receive 1 dose of Tdap vaccine during each pregnancy. 1 dose of Td every 10 years.  Varicella vaccine.** / Consult your health care provider. Pregnant females who do not have evidence of immunity should receive the first dose after pregnancy.  HPV vaccine. / 3 doses over 6 months, if 93 and younger. The vaccine is not recommended for use in pregnant females. However, pregnancy testing is not needed before receiving a dose.  Measles, mumps, rubella (MMR) vaccine.** / You need at least 1 dose of MMR if you were born in 1957 or later. You may also need a 2nd dose. For females of childbearing age, rubella immunity should be determined. If there is no evidence of immunity, females who are not pregnant should be vaccinated. If there is no evidence of immunity, females who are  pregnant should delay immunization until after pregnancy.  Pneumococcal  13-valent conjugate (PCV13) vaccine.** / Consult your health care provider.  Pneumococcal polysaccharide (PPSV23) vaccine.** / 1 to 2 doses if you smoke cigarettes or if you have certain conditions.  Meningococcal vaccine.** / 1 dose if you are age 68 to 8 years and a Market researcher living in a residence hall, or have one of several medical conditions, you need to get vaccinated against meningococcal disease. You may also need additional booster doses.  Hepatitis A vaccine.** / Consult your health care provider.  Hepatitis B vaccine.** / Consult your health care provider.  Haemophilus influenzae type b (Hib) vaccine.** / Consult your health care provider. Ages 7 to 53 years  Blood pressure check.** / Every year.  Lipid and cholesterol check.** / Every 5 years beginning at age 25 years.  Lung cancer screening. / Every year if you are aged 11-80 years and have a 30-pack-year history of smoking and currently smoke or have quit within the past 15 years. Yearly screening is stopped once you have quit smoking for at least 15 years or develop a health problem that would prevent you from having lung cancer treatment.  Clinical breast exam.** / Every year after age 48 years.  BRCA-related cancer risk assessment.** / For women who have family members with a BRCA-related cancer (breast, ovarian, tubal, or peritoneal cancers).  Mammogram.** / Every year beginning at age 41 years and continuing for as long as you are in good health. Consult with your health care provider.  Pap test.** / Every 3 years starting at age 65 years through age 37 or 70 years with a history of 3 consecutive normal Pap tests.  HPV screening.** / Every 3 years from ages 72 years through ages 60 to 40 years with a history of 3 consecutive normal Pap tests.  Fecal occult blood test (FOBT) of stool. / Every year beginning at age 21 years and continuing until age 5 years. You may not need to do this test if you get  a colonoscopy every 10 years.  Flexible sigmoidoscopy or colonoscopy.** / Every 5 years for a flexible sigmoidoscopy or every 10 years for a colonoscopy beginning at age 35 years and continuing until age 48 years.  Hepatitis C blood test.** / For all people born from [birth year] through 1965 and any individual with known risks for hepatitis C.  Skin self-exam. / Monthly.  Influenza vaccine. / Every year.  Tetanus, diphtheria, and acellular pertussis (Tdap/Td) vaccine.** / Consult your health care provider. Pregnant women should receive 1 dose of Tdap vaccine during each pregnancy. 1 dose of Td every 10 years.  Varicella vaccine.** / Consult your health care provider. Pregnant females who do not have evidence of immunity should receive the first dose after pregnancy.  Zoster vaccine.** / 1 dose for adults aged 30 years or older.  Measles, mumps, rubella (MMR) vaccine.** / You need at least 1 dose of MMR if you were born in 1957 or later. You may also need a second dose. For females of childbearing age, rubella immunity should be determined. If there is no evidence of immunity, females who are not pregnant should be vaccinated. If there is no evidence of immunity, females who are pregnant should delay immunization until after pregnancy.  Pneumococcal 13-valent conjugate (PCV13) vaccine.** / Consult your health care provider.  Pneumococcal polysaccharide (PPSV23) vaccine.** / 1 to 2 doses if you smoke cigarettes or if you have certain conditions.  Meningococcal vaccine.** /  Consult your health care provider.  Hepatitis A vaccine.** / Consult your health care provider.  Hepatitis B vaccine.** / Consult your health care provider.  Haemophilus influenzae type b (Hib) vaccine.** / Consult your health care provider. Ages 64 years and over  Blood pressure check.** / Every year.  Lipid and cholesterol check.** / Every 5 years beginning at age 23 years.  Lung cancer screening. / Every year if you  are aged 16-80 years and have a 30-pack-year history of smoking and currently smoke or have quit within the past 15 years. Yearly screening is stopped once you have quit smoking for at least 15 years or develop a health problem that would prevent you from having lung cancer treatment.  Clinical breast exam.** / Every year after age 74 years.  BRCA-related cancer risk assessment.** / For women who have family members with a BRCA-related cancer (breast, ovarian, tubal, or peritoneal cancers).  Mammogram.** / Every year beginning at age 44 years and continuing for as long as you are in good health. Consult with your health care provider.  Pap test.** / Every 3 years starting at age 58 years through age 22 or 39 years with 3 consecutive normal Pap tests. Testing can be stopped between 65 and 70 years with 3 consecutive normal Pap tests and no abnormal Pap or HPV tests in the past 10 years.  HPV screening.** / Every 3 years from ages 64 years through ages 70 or 61 years with a history of 3 consecutive normal Pap tests. Testing can be stopped between 65 and 70 years with 3 consecutive normal Pap tests and no abnormal Pap or HPV tests in the past 10 years.  Fecal occult blood test (FOBT) of stool. / Every year beginning at age 40 years and continuing until age 27 years. You may not need to do this test if you get a colonoscopy every 10 years.  Flexible sigmoidoscopy or colonoscopy.** / Every 5 years for a flexible sigmoidoscopy or every 10 years for a colonoscopy beginning at age 7 years and continuing until age 32 years.  Hepatitis C blood test.** / For all people born from [birth year] through 1963 and any individual with known risks for hepatitis C.  Osteoporosis screening.** / A one-time screening for women ages 30 years and over and women at risk for fractures or osteoporosis.  Skin self-exam. / Monthly.  Influenza vaccine. / Every year.  Tetanus, diphtheria, and acellular pertussis (Tdap/Td)  vaccine.** / 1 dose of Td every 10 years.  Varicella vaccine.** / Consult your health care provider.  Zoster vaccine.** / 1 dose for adults aged 35 years or older.  Pneumococcal 13-valent conjugate (PCV13) vaccine.** / Consult your health care provider.  Pneumococcal polysaccharide (PPSV23) vaccine.** / 1 dose for all adults aged 46 years and older.  Meningococcal vaccine.** / Consult your health care provider.  Hepatitis A vaccine.** / Consult your health care provider.  Hepatitis B vaccine.** / Consult your health care provider.  Haemophilus influenzae type b (Hib) vaccine.** / Consult your health care provider. ** Family history and personal history of risk and conditions may change your health care provider's recommendations.   This information is not intended to replace advice given to you by your health care provider. Make sure you discuss any questions you have with your health care provider.   Document Released: 10/31/2001 Document Revised: 09/25/2014 Document Reviewed: 01/30/2011 Elsevier Interactive Patient Education [employer].

## 2015-10-15 NOTE — Progress Notes (Signed)
Subjective:   Wendy Christensen is a 70 y.o. female who presents for Medicare Annual (Subsequent) preventive examination.  Review of Systems:   Review of Systems  Constitutional: Negative for activity change, appetite change and fatigue.  HENT: Negative for hearing loss, congestion, tinnitus and ear discharge.   Eyes: Negative for visual disturbance (see optho q1y --  Cataract surgery next month Respiratory: Negative for cough, chest tightness and shortness of breath.   Cardiovascular: Negative for chest pain, palpitations and leg swelling.  Gastrointestinal: Negative for abdominal pain, diarrhea, constipation and abdominal distention.  Genitourinary: Negative for urgency, frequency, decreased urine volume and difficulty urinating.  Musculoskeletal: Negative for back pain, arthralgias and gait problem.  Skin: Negative for color change, pallor and rash.  Neurological: Negative for dizziness, light-headedness, numbness and headaches.  Hematological: Negative for adenopathy. Does not bruise/bleed easily.  Psychiatric/Behavioral: Negative for suicidal ideas, confusion, sleep disturbance, self-injury, dysphoric mood, decreased concentration and agitation.  Pt is able to read and write and can do all ADLs No risk for falling No abuse/ violence in home           Objective:     Vitals: BP 157/76 mmHg  Pulse 52  Temp(Src) 98.6 F (37 C) (Oral)  Ht 5\' 5"  (1.651 m)  Wt 176 lb 6.4 oz (80.015 kg)  BMI 29.35 kg/m2  SpO2 98% BP 157/76 mmHg  Pulse 52  Temp(Src) 98.6 F (37 C) (Oral)  Ht 5\' 5"  (1.651 m)  Wt 176 lb 6.4 oz (80.015 kg)  BMI 29.35 kg/m2  SpO2 98% General appearance: alert, cooperative, appears stated age and no distress Head: Normocephalic, without obvious abnormality, atraumatic Eyes: conjunctivae/corneas clear. PERRL, EOM's intact. Fundi benign. Ears: normal TM's and external ear canals both ears Nose: Nares normal. Septum midline. Mucosa normal. No drainage  or sinus tenderness. Throat: lips, mucosa, and tongue normal; teeth and gums normal Neck: no adenopathy, no carotid bruit, no JVD, supple, symmetrical, trachea midline and thyroid not enlarged, symmetric, no tenderness/mass/nodules Back: symmetric, no curvature. ROM normal. No CVA tenderness. Lungs: clear to auscultation bilaterally Breasts: normal appearance, no masses or tenderness Heart: S1, S2 normal Abdomen: soft, non-tender; bowel sounds normal; no masses,  no organomegaly Pelvic: deferred-- gyn Extremities: extremities normal, atraumatic, no cyanosis or edema Pulses: 2+ and symmetric Skin: Skin color, texture, turgor normal. No rashes or lesions Lymph nodes: Cervical, supraclavicular, and axillary nodes normal. Neurologic: Alert and oriented X 3, normal strength and tone. Normal symmetric reflexes. Normal coordination and gait Psych-- no depression, anxiety Tobacco History  Smoking status  . Never Smoker   Smokeless tobacco  . Never Used     Counseling given: Not Answered   Past Medical History  Diagnosis Date  . GERD (gastroesophageal reflux disease)   . Hypertension   . Hyperlipidemia   . Anxiety   . Allergic rhinitis   . Depression   . Thyroid goiter   . Asthma, intrinsic   . Frequency of urination   . Urgency of urination   . Wears glasses   . OSA on CPAP     PT IN PROCESS OF GETTING MACHINE SET UP  . Kidney stone     Left kidney-calcium    Past Surgical History  Procedure Laterality Date  . Tubal ligation  1980's  . Dilation and curettage of uterus  1990    W/ HYSTEROSCOPY  . Cystoscopy with retrograde pyelogram, ureteroscopy and stent placement Left 06/21/2015    Procedure: CYSTOSCOPY WITH RETROGRADE PYELOGRAM,  URETEROSCOPY AND STENT PLACEMENT;  Surgeon: Nickie Retort, MD;  Location: [facility];  Service: Urology;  Laterality: Left;  . Holmium laser application Left 0000000    Procedure: HOLMIUM LASER APPLICATION;  Surgeon: Nickie Retort, MD;  Location: [facility];  Service: Urology;  Laterality: Left;  . Stone extraction with basket Left 06/21/2015    Procedure: STONE EXTRACTION WITH BASKET;  Surgeon: Nickie Retort, MD;  Location: [facility]  Service: Urology;  Laterality: Left;   Family History  Problem Relation Age of Onset  . Hypertension Mother   . COPD Father   . Hyperlipidemia    . Hypertension    . Asthma Father   . Heart disease Mother   . Kidney cancer Maternal Aunt    History  Sexual Activity  . Sexual Activity:  . Partners: Male    Comment: Husband     Outpatient Encounter Prescriptions as of 10/15/2015  Medication Sig  . acetaminophen (TYLENOL) 325 MG tablet Take 650 mg by mouth every 6 (six) hours as needed.  Marland Kitchen albuterol (PROVENTIL HFA;VENTOLIN HFA) 108 (90 BASE) MCG/ACT inhaler Inhale 2 puffs into the lungs every 4 (four) hours as needed for wheezing.  Marland Kitchen atorvastatin (LIPITOR) 40 MG tablet Take 1 tablet (40 mg total) by mouth daily.  . citalopram (CELEXA) 40 MG tablet Take 1 tablet (40 mg total) by mouth every morning.  . fenofibrate 160 MG tablet TAKE 1 TABLET BY MOUTH DAILY (Patient taking differently: TAKE 1 TABLET BY MOUTH DAILY--  TAKES IN PM)  . FLOVENT HFA 110 MCG/ACT inhaler Inhale 2 puffs into the lungs daily as needed.   . fluticasone (FLONASE) 50 MCG/ACT nasal spray Place 2 sprays into both nostrils as needed.  Marland Kitchen gentamicin (GARAMYCIN) 0.3 % ophthalmic solution Reported on 09/24/2015  . metoprolol (LOPRESSOR) 100 MG tablet Take 1 tablet (100 mg total) by mouth 2 (two) times daily.  . montelukast (SINGULAIR) 10 MG tablet Take 1 tablet by mouth at bedtime.  . Multiple Vitamins-Minerals (MULTIVITAMIN WITH MINERALS) tablet Take 1 tablet by mouth daily.  Marland Kitchen omeprazole (PRILOSEC) 20 MG capsule Take 1 capsule (20 mg total) by mouth daily. (Patient taking differently: Take 20 mg by mouth every morning. )  . valsartan (DIOVAN) 320 MG tablet Take 1  tablet (320 mg total) by mouth daily.  . vitamin B-12 (CYANOCOBALAMIN) 1000 MCG tablet Take 1,000 mcg by mouth daily.  . [DISCONTINUED] citalopram (CELEXA) 40 MG tablet Take 40 mg by mouth every morning.   . [DISCONTINUED] doxycycline (VIBRA-TABS) 100 MG tablet Take 1 tablet (100 mg total) by mouth 2 (two) times daily.   No facility-administered encounter medications on file as of 10/15/2015.    Activities of Daily Living In your present state of health, do you have any difficulty performing the following activities: 07/07/2015 06/21/2015  Hearing? N N  Vision? N N  Difficulty concentrating or making decisions? N N  Walking or climbing stairs? N N  Dressing or bathing? N N  Doing errands, shopping? N -  Preparing Food and eating ? N -  Using the Toilet? N -  In the past six months, have you accidently leaked urine? Y -  Do you have problems with loss of bowel control? N -  Managing your Medications? N -  Managing your Finances? N -  Housekeeping or managing your Housekeeping? N -    Patient Care Team: Rosalita Chessman, DO as PCP - General Sandy Salaam  Deatra Ina, MD as Consulting Physician (Gastroenterology) Rigoberto Noel, MD as Consulting Physician (Pulmonary Disease) Renato Shin, MD as Consulting Physician (Endocrinology) Rexene Agent, MD as Attending Physician (Nephrology) Nickie Retort, MD as Consulting Physician (Urology) Mosetta Anis, MD as Referring Physician (Allergy)    Assessment:    CPE: Exercise Activities and Dietary recommendations--- just signed up for the Y     Goals    . Keep blood pressure below 130/80    . Keep cholesterol within normal range      Fall Risk Fall Risk  07/07/2015 09/22/2013 12/16/2012  Falls in the past year? No No No  Number falls in past yr: 2 or more - -  Injury with Fall? No - -  Follow up Education provided;Falls prevention discussed - -   Depression Screen PHQ 2/9 Scores 07/07/2015 09/22/2013  PHQ - 2 Score 0 0     Cognitive  Testing MMSE - Mini Mental State Exam 07/07/2015  Orientation to time 5  Orientation to Place 5  Registration 3  Attention/ Calculation 5  Recall 2  Language- name 2 objects 2  Language- repeat 1  Language- follow 3 step command 3  Language- read & follow direction 1  Write a sentence 1  Copy design 1  Total score 29    Immunization History  Administered Date(s) Administered  . Influenza Split 05/29/2012, 06/17/2013  . Influenza Whole 07/31/2007  . Influenza, High Dose Seasonal PF 07/02/2015  . Influenza-Unspecified 08/07/2014  . Pneumococcal Conjugate-13 07/07/2015  . Pneumococcal Polysaccharide-23 12/16/2012  . Td 04/18/2005  . Zoster 04/24/2008   Screening Tests Health Maintenance  Topic Date Due  . Hepatitis C Screening  1946/04/24  . TETANUS/TDAP  04/19/2015  . INFLUENZA VACCINE  04/18/2016  . COLONOSCOPY  11/13/2016  . MAMMOGRAM  07/28/2017  . DEXA SCAN  Completed  . ZOSTAVAX  Completed  . PNA vac Low Risk Adult  Completed      Plan:    see avs During the course of the visit the patient was educated and counseled about the following appropriate screening and preventive services:   Vaccines to include Pneumoccal, Influenza, Hepatitis B, Td, Zostavax, HCV  Electrocardiogram  Cardiovascular Disease  Colorectal cancer screening  Bone density screening  Diabetes screening  Glaucoma screening  Mammography/PAP  Nutrition counseling   Patient Instructions (the written plan) was given to the patient.  1. Generalized anxiety disorder   - citalopram (CELEXA) 40 MG tablet; Take 1 tablet (40 mg total) by mouth every morning.  Dispense: 90 tablet; Refill: 1  2. Need for hepatitis C screening test   - Hepatitis C antibody  3. Essential hypertension   Current outpatient prescriptions:  .  acetaminophen (TYLENOL) 325 MG tablet, Take 650 mg by mouth every 6 (six) hours as needed., Disp: , Rfl:  .  albuterol (PROVENTIL HFA;VENTOLIN HFA) 108 (90 BASE)  MCG/ACT inhaler, Inhale 2 puffs into the lungs every 4 (four) hours as needed for wheezing., Disp: 1 Inhaler, Rfl: 0 .  atorvastatin (LIPITOR) 40 MG tablet, Take 1 tablet (40 mg total) by mouth daily., Disp: 90 tablet, Rfl: 1 .  citalopram (CELEXA) 40 MG tablet, Take 1 tablet (40 mg total) by mouth every morning., Disp: 90 tablet, Rfl: 1 .  fenofibrate 160 MG tablet, TAKE 1 TABLET BY MOUTH DAILY (Patient taking differently: TAKE 1 TABLET BY MOUTH DAILY--  TAKES IN PM), Disp: 30 tablet, Rfl: 5 .  FLOVENT HFA 110 MCG/ACT inhaler, Inhale 2 puffs into  the lungs daily as needed. , Disp: , Rfl: 3 .  fluticasone (FLONASE) 50 MCG/ACT nasal spray, Place 2 sprays into both nostrils as needed., Disp: , Rfl: 3 .  gentamicin (GARAMYCIN) 0.3 % ophthalmic solution, Reported on 09/24/2015, Disp: , Rfl: 0 .  metoprolol (LOPRESSOR) 100 MG tablet, Take 1 tablet (100 mg total) by mouth 2 (two) times daily., Disp: 180 tablet, Rfl: 1 .  montelukast (SINGULAIR) 10 MG tablet, Take 1 tablet by mouth at bedtime., Disp: , Rfl:  .  Multiple Vitamins-Minerals (MULTIVITAMIN WITH MINERALS) tablet, Take 1 tablet by mouth daily., Disp: , Rfl:  .  omeprazole (PRILOSEC) 20 MG capsule, Take 1 capsule (20 mg total) by mouth daily. (Patient taking differently: Take 20 mg by mouth every morning. ), Disp: 90 capsule, Rfl: 3 .  valsartan (DIOVAN) 320 MG tablet, Take 1 tablet (320 mg total) by mouth daily., Disp: 30 tablet, Rfl: 2 .  vitamin B-12 (CYANOCOBALAMIN) 1000 MCG tablet, Take 1,000 mcg by mouth daily., Disp: , Rfl:   - CBC with Differential/Platelet - Hepatic function panel - Lipid panel - POCT urinalysis dipstick  4. Hyperlipemia  Current outpatient prescriptions:  .  acetaminophen (TYLENOL) 325 MG tablet, Take 650 mg by mouth every 6 (six) hours as needed., Disp: , Rfl:  .  albuterol (PROVENTIL HFA;VENTOLIN HFA) 108 (90 BASE) MCG/ACT inhaler, Inhale 2 puffs into the lungs every 4 (four) hours as needed for wheezing., Disp: 1  Inhaler, Rfl: 0 .  atorvastatin (LIPITOR) 40 MG tablet, Take 1 tablet (40 mg total) by mouth daily., Disp: 90 tablet, Rfl: 1 .  citalopram (CELEXA) 40 MG tablet, Take 1 tablet (40 mg total) by mouth every morning., Disp: 90 tablet, Rfl: 1 .  fenofibrate 160 MG tablet, TAKE 1 TABLET BY MOUTH DAILY (Patient taking differently: TAKE 1 TABLET BY MOUTH DAILY--  TAKES IN PM), Disp: 30 tablet, Rfl: 5 .  FLOVENT HFA 110 MCG/ACT inhaler, Inhale 2 puffs into the lungs daily as needed. , Disp: , Rfl: 3 .  fluticasone (FLONASE) 50 MCG/ACT nasal spray, Place 2 sprays into both nostrils as needed., Disp: , Rfl: 3 .  gentamicin (GARAMYCIN) 0.3 % ophthalmic solution, Reported on 09/24/2015, Disp: , Rfl: 0 .  metoprolol (LOPRESSOR) 100 MG tablet, Take 1 tablet (100 mg total) by mouth 2 (two) times daily., Disp: 180 tablet, Rfl: 1 .  montelukast (SINGULAIR) 10 MG tablet, Take 1 tablet by mouth at bedtime., Disp: , Rfl:  .  Multiple Vitamins-Minerals (MULTIVITAMIN WITH MINERALS) tablet, Take 1 tablet by mouth daily., Disp: , Rfl:  .  omeprazole (PRILOSEC) 20 MG capsule, Take 1 capsule (20 mg total) by mouth daily. (Patient taking differently: Take 20 mg by mouth every morning. ), Disp: 90 capsule, Rfl: 3 .  valsartan (DIOVAN) 320 MG tablet, Take 1 tablet (320 mg total) by mouth daily., Disp: 30 tablet, Rfl: 2 .  vitamin B-12 (CYANOCOBALAMIN) 1000 MCG tablet, Take 1,000 mcg by mouth daily., Disp: , Rfl:   - CBC with Differential/Platelet - Hepatic function panel - Lipid panel - POCT urinalysis dipstick  5. Preventative health care   6. Routine history and physical examination of adult     Garnet Koyanagi, DO  10/15/2015

## 2015-10-16 LAB — HEPATITIS C ANTIBODY: HCV AB: NEGATIVE

## 2015-10-21 DIAGNOSIS — Z124 Encounter for screening for malignant neoplasm of cervix: Secondary | ICD-10-CM | POA: Diagnosis not present

## 2015-10-21 DIAGNOSIS — Z01411 Encounter for gynecological examination (general) (routine) with abnormal findings: Secondary | ICD-10-CM | POA: Diagnosis not present

## 2015-10-21 DIAGNOSIS — N819 Female genital prolapse, unspecified: Secondary | ICD-10-CM | POA: Diagnosis not present

## 2015-10-21 DIAGNOSIS — Z13820 Encounter for screening for osteoporosis: Secondary | ICD-10-CM | POA: Diagnosis not present

## 2015-10-22 ENCOUNTER — Other Ambulatory Visit: Payer: Self-pay | Admitting: Family Medicine

## 2015-10-22 DIAGNOSIS — E049 Nontoxic goiter, unspecified: Secondary | ICD-10-CM

## 2015-10-28 DIAGNOSIS — H2512 Age-related nuclear cataract, left eye: Secondary | ICD-10-CM | POA: Diagnosis not present

## 2015-10-28 DIAGNOSIS — H25812 Combined forms of age-related cataract, left eye: Secondary | ICD-10-CM | POA: Diagnosis not present

## 2015-11-04 ENCOUNTER — Other Ambulatory Visit: Payer: Self-pay | Admitting: Family Medicine

## 2015-11-05 DIAGNOSIS — G4733 Obstructive sleep apnea (adult) (pediatric): Secondary | ICD-10-CM | POA: Diagnosis not present

## 2015-11-15 DIAGNOSIS — G4733 Obstructive sleep apnea (adult) (pediatric): Secondary | ICD-10-CM | POA: Diagnosis not present

## 2015-11-16 ENCOUNTER — Other Ambulatory Visit (INDEPENDENT_AMBULATORY_CARE_PROVIDER_SITE_OTHER): Payer: Medicare Other

## 2015-11-16 DIAGNOSIS — E049 Nontoxic goiter, unspecified: Secondary | ICD-10-CM

## 2015-11-16 LAB — THYROID PANEL WITH TSH
FREE THYROXINE INDEX: 2.1 (ref 1.4–3.8)
T3 Uptake: 27 % (ref 22–35)
T4, Total: 7.7 ug/dL (ref 4.5–12.0)
TSH: 1.18 m[IU]/L

## 2015-11-17 DIAGNOSIS — N819 Female genital prolapse, unspecified: Secondary | ICD-10-CM | POA: Diagnosis not present

## 2015-11-17 DIAGNOSIS — F419 Anxiety disorder, unspecified: Secondary | ICD-10-CM | POA: Diagnosis not present

## 2015-11-17 DIAGNOSIS — M859 Disorder of bone density and structure, unspecified: Secondary | ICD-10-CM | POA: Diagnosis not present

## 2015-11-17 DIAGNOSIS — Z13820 Encounter for screening for osteoporosis: Secondary | ICD-10-CM | POA: Diagnosis not present

## 2015-11-25 DIAGNOSIS — H25811 Combined forms of age-related cataract, right eye: Secondary | ICD-10-CM | POA: Diagnosis not present

## 2015-11-25 DIAGNOSIS — H2511 Age-related nuclear cataract, right eye: Secondary | ICD-10-CM | POA: Diagnosis not present

## 2015-12-06 DIAGNOSIS — G4733 Obstructive sleep apnea (adult) (pediatric): Secondary | ICD-10-CM | POA: Diagnosis not present

## 2015-12-13 DIAGNOSIS — G4733 Obstructive sleep apnea (adult) (pediatric): Secondary | ICD-10-CM | POA: Diagnosis not present

## 2016-01-05 ENCOUNTER — Ambulatory Visit: Payer: Self-pay | Admitting: Internal Medicine

## 2016-01-05 ENCOUNTER — Encounter: Payer: Self-pay | Admitting: Acute Care

## 2016-01-05 ENCOUNTER — Ambulatory Visit (INDEPENDENT_AMBULATORY_CARE_PROVIDER_SITE_OTHER): Payer: Medicare Other | Admitting: Acute Care

## 2016-01-05 VITALS — BP 142/82 | HR 77 | Ht 65.0 in | Wt 181.0 lb

## 2016-01-05 DIAGNOSIS — J069 Acute upper respiratory infection, unspecified: Secondary | ICD-10-CM

## 2016-01-05 DIAGNOSIS — G4733 Obstructive sleep apnea (adult) (pediatric): Secondary | ICD-10-CM

## 2016-01-05 MED ORDER — ALBUTEROL SULFATE HFA 108 (90 BASE) MCG/ACT IN AERS: 2.0000 | INHALATION_SPRAY | RESPIRATORY_TRACT | Status: DC | PRN

## 2016-01-05 NOTE — Progress Notes (Signed)
Subjective:    Patient ID: Wendy Christensen, female    DOB: [birth year], 70 y.o.   MRN: [id number]  HPI   70 year old woman with OSA who was started on CPAP after sleep study indicated AHI of 17.9. Pt. Continues on CPAP at set pressure of 10 cm H2O and returns to office today for follow up:  Significant Studies:  Sleep Study 03/26/15: AHI: 17.9 Desat to 89% Titration Study: 03/2015 No Desaturations Download 12/06/2015-01/04/2016 AHI : 4.2 Compliance : 87% Usage days: 26/30 Average Usage: days used 8 hours 14 minutes.  01/05/2016:CPAP Follow Up.OV:  Pt returns to the office for follow up for CPAP use.  She is wearing her CPAP each night. She did not wear it for 4 nights due to eye surgery and head gear irritating her surgical site. She is having no problems.Her headgear is loose, and she may new strap. She has had a mild flare of asthma with seasonal allergies. She is using her Zyrtec and Flonase, and her Pro Air inhaler as needed. She denies chest pain, cough, orthopnea,hemoptysis, leg or calf pain.   Current outpatient prescriptions:  .  acetaminophen (TYLENOL) 325 MG tablet, Take 650 mg by mouth every 6 (six) hours as needed., Disp: , Rfl:  .  albuterol (PROVENTIL HFA;VENTOLIN HFA) 108 (90 Base) MCG/ACT inhaler, Inhale 2 puffs into the lungs every 4 (four) hours as needed for wheezing., Disp: 1 Inhaler, Rfl: 0 .  atorvastatin (LIPITOR) 40 MG tablet, Take 1 tablet (40 mg total) by mouth daily., Disp: 90 tablet, Rfl: 1 .  citalopram (CELEXA) 40 MG tablet, Take 1 tablet (40 mg total) by mouth every morning., Disp: 90 tablet, Rfl: 1 .  fenofibrate 160 MG tablet, TAKE 1 TABLET BY MOUTH DAILY (Patient taking differently: TAKE 1 TABLET BY MOUTH DAILY--  TAKES IN PM), Disp: 30 tablet, Rfl: 5 .  FLOVENT HFA 110 MCG/ACT inhaler, Inhale 2 puffs into the lungs daily as needed. , Disp: , Rfl: 3 .  fluticasone (FLONASE) 50 MCG/ACT nasal spray, Place 2 sprays into both nostrils as needed.,  Disp: , Rfl: 3 .  gentamicin (GARAMYCIN) 0.3 % ophthalmic solution, Reported on 09/24/2015, Disp: , Rfl: 0 .  metoprolol (LOPRESSOR) 100 MG tablet, Take 1 tablet (100 mg total) by mouth 2 (two) times daily., Disp: 180 tablet, Rfl: 1 .  montelukast (SINGULAIR) 10 MG tablet, Take 1 tablet by mouth at bedtime., Disp: , Rfl:  .  Multiple Vitamins-Minerals (MULTIVITAMIN WITH MINERALS) tablet, Take 1 tablet by mouth daily., Disp: , Rfl:  .  omeprazole (PRILOSEC) 20 MG capsule, Take 1 capsule (20 mg total) by mouth daily. (Patient taking differently: Take 20 mg by mouth every morning. ), Disp: 90 capsule, Rfl: 3 .  Propylene Glycol-Glycerin (MOISTURE EYES OP), Apply to eye., Disp: , Rfl:  .  valsartan (DIOVAN) 320 MG tablet, TAKE 1 TABLET BY MOUTH DAILY, Disp: 90 tablet, Rfl: 1 .  vitamin B-12 (CYANOCOBALAMIN) 1000 MCG tablet, Take 1,000 mcg by mouth daily., Disp: , Rfl:    Past Medical History  Diagnosis Date  . GERD (gastroesophageal reflux disease)   . Hypertension   . Hyperlipidemia   . Anxiety   . Allergic rhinitis   . Depression   . Thyroid goiter   . Asthma, intrinsic   . Frequency of urination   . Urgency of urination   . Wears glasses   . OSA on CPAP     PT IN PROCESS OF GETTING MACHINE SET UP  .  Kidney stone     Left kidney-calcium     No Known Allergies  Review of Systems Constitutional:   No  weight loss, night sweats,  Fevers, chills, fatigue, or  lassitude.  HEENT:   No headaches,  Difficulty swallowing,  Tooth/dental problems, or  Sore throat,                No sneezing, itching, ear ache, nasal congestion, post nasal drip,   CV:  No chest pain,  Orthopnea, PND, swelling in lower extremities, anasarca, dizziness, palpitations, syncope.   GI  No heartburn, indigestion, abdominal pain, nausea, vomiting, diarrhea, change in bowel habits, loss of appetite, bloody stools.   Resp: No shortness of breath with exertion or at rest.  No excess mucus, no productive cough,  No  non-productive cough,  No coughing up of blood.  No change in color of mucus.  No wheezing.  No chest wall deformity  Skin: no rash or lesions.  GU: no dysuria, change in color of urine, no urgency or frequency.  No flank pain, no hematuria   MS:  No joint pain or swelling.  No decreased range of motion.  No back pain.  Psych:  No change in mood or affect. No depression or anxiety.  No memory loss.        Objective:   Physical Exam BP 142/82 mmHg  Pulse 77  Ht 5\' 5"  (1.651 m)  Wt 181 lb (82.101 kg)  BMI 30.12 kg/m2  SpO2 96%  Physical Exam:  General- No distress,  A&Ox3 ENT: No sinus tenderness, TM clear, pale nasal mucosa, no oral exudate,no post nasal drip, no LAN Cardiac: S1, S2, regular rate and rhythm, no murmur Chest: No wheeze/ rales/ dullness; no accessory muscle use, no nasal flaring, no sternal retractions Abd.: Soft Non-tender Ext: No clubbing cyanosis, edema Neuro:  normal strength Skin: No rashes, warm and dry Psych: normal mood and behavior  Magdalen Spatz, AGACNP-BC [facility] 01/05/2016     Assessment & Plan:

## 2016-01-05 NOTE — Patient Instructions (Addendum)
It is nice to meet you today. You are benefiting from the CPAP treatment. Continue on CPAP at bedtime. You appear to be benefiting from the treatment Goal is to wear for at least 4-6 hours each night for maximal clinical benefit. Continue to work on weight loss, as the link between excess weight  and sleep apnea is well established.  Do not drive if sleepy. Follow up with Dr. Elsworth Soho or me in 6 months or before as needed.  Continue using your Zyrtec and Flonase for allergies. We will give you a prescription for [employer] Inhaler. Please contact office for sooner follow up if symptoms do not improve or worsen or seek emergency care

## 2016-01-05 NOTE — Assessment & Plan Note (Signed)
Doing well on CPAP Plan:   Continue on CPAP at bedtime. You appear to be benefiting from the treatment Goal is to wear for at least 4-6 hours each night for maximal clinical benefit. Continue to work on weight loss, as the link between excess weight  and sleep apnea is well established.  Do not drive if sleepy. Follow up with Dr. Elsworth Soho In 6 months or before as needed.

## 2016-01-05 NOTE — Assessment & Plan Note (Signed)
Seasonal Allergies Plan: Continue with Zyrtec Continue with Flonase Rescue inhaler as needed Please contact office for sooner follow up if symptoms do not improve or worsen or seek emergency care

## 2016-01-06 DIAGNOSIS — G4733 Obstructive sleep apnea (adult) (pediatric): Secondary | ICD-10-CM | POA: Diagnosis not present

## 2016-01-06 NOTE — Progress Notes (Signed)
Reviewed & agree with plan  

## 2016-01-13 DIAGNOSIS — G4733 Obstructive sleep apnea (adult) (pediatric): Secondary | ICD-10-CM | POA: Diagnosis not present

## 2016-01-21 ENCOUNTER — Other Ambulatory Visit: Payer: Self-pay | Admitting: Family Medicine

## 2016-01-22 ENCOUNTER — Other Ambulatory Visit: Payer: Self-pay | Admitting: Family Medicine

## 2016-02-08 DIAGNOSIS — G4733 Obstructive sleep apnea (adult) (pediatric): Secondary | ICD-10-CM | POA: Diagnosis not present

## 2016-02-12 DIAGNOSIS — G4733 Obstructive sleep apnea (adult) (pediatric): Secondary | ICD-10-CM | POA: Diagnosis not present

## 2016-02-18 ENCOUNTER — Ambulatory Visit
Admission: RE | Admit: 2016-02-18 | Discharge: 2016-02-18 | Disposition: A | Discharge status: Home / Self Care | Payer: Medicare Other | Source: Ambulatory Visit | Attending: Allergy | Admitting: Allergy

## 2016-02-18 ENCOUNTER — Other Ambulatory Visit: Payer: Self-pay | Admitting: Allergy

## 2016-02-18 DIAGNOSIS — J40 Bronchitis, not specified as acute or chronic: Secondary | ICD-10-CM | POA: Diagnosis not present

## 2016-02-18 DIAGNOSIS — J209 Acute bronchitis, unspecified: Secondary | ICD-10-CM | POA: Diagnosis not present

## 2016-02-18 DIAGNOSIS — J4530 Mild persistent asthma, uncomplicated: Secondary | ICD-10-CM | POA: Diagnosis not present

## 2016-02-18 DIAGNOSIS — J300 Vasomotor rhinitis: Secondary | ICD-10-CM | POA: Diagnosis not present

## 2016-02-18 DIAGNOSIS — T781XXD Other adverse food reactions, not elsewhere classified, subsequent encounter: Secondary | ICD-10-CM | POA: Diagnosis not present

## 2016-02-29 ENCOUNTER — Other Ambulatory Visit: Payer: Self-pay

## 2016-02-29 DIAGNOSIS — F411 Generalized anxiety disorder: Secondary | ICD-10-CM

## 2016-02-29 DIAGNOSIS — K219 Gastro-esophageal reflux disease without esophagitis: Secondary | ICD-10-CM

## 2016-02-29 MED ORDER — VALSARTAN 320 MG PO TABS: 320.0000 mg | ORAL_TABLET | Freq: Every day | ORAL | Status: DC

## 2016-02-29 MED ORDER — OMEPRAZOLE 20 MG PO CPDR: 20.0000 mg | DELAYED_RELEASE_CAPSULE | Freq: Every day | ORAL | Status: DC

## 2016-02-29 MED ORDER — METOPROLOL TARTRATE 100 MG PO TABS: 100.0000 mg | ORAL_TABLET | Freq: Two times a day (BID) | ORAL | Status: DC

## 2016-02-29 MED ORDER — ATORVASTATIN CALCIUM 40 MG PO TABS: 40.0000 mg | ORAL_TABLET | Freq: Every day | ORAL | Status: DC

## 2016-02-29 MED ORDER — CITALOPRAM HYDROBROMIDE 40 MG PO TABS: 40.0000 mg | ORAL_TABLET | Freq: Every morning | ORAL | Status: DC

## 2016-02-29 MED ORDER — MONTELUKAST SODIUM 10 MG PO TABS: 10.0000 mg | ORAL_TABLET | Freq: Every day | ORAL | Status: DC

## 2016-03-06 DIAGNOSIS — I10 Essential (primary) hypertension: Secondary | ICD-10-CM | POA: Diagnosis not present

## 2016-03-06 DIAGNOSIS — N200 Calculus of kidney: Secondary | ICD-10-CM | POA: Diagnosis not present

## 2016-03-06 DIAGNOSIS — N183 Chronic kidney disease, stage 3 (moderate): Secondary | ICD-10-CM | POA: Diagnosis not present

## 2016-03-07 ENCOUNTER — Other Ambulatory Visit: Payer: Self-pay | Admitting: Family Medicine

## 2016-03-09 DIAGNOSIS — G4733 Obstructive sleep apnea (adult) (pediatric): Secondary | ICD-10-CM | POA: Diagnosis not present

## 2016-03-14 DIAGNOSIS — G4733 Obstructive sleep apnea (adult) (pediatric): Secondary | ICD-10-CM | POA: Diagnosis not present

## 2016-04-11 DIAGNOSIS — G4733 Obstructive sleep apnea (adult) (pediatric): Secondary | ICD-10-CM | POA: Diagnosis not present

## 2016-04-13 DIAGNOSIS — G4733 Obstructive sleep apnea (adult) (pediatric): Secondary | ICD-10-CM | POA: Diagnosis not present

## 2016-04-14 ENCOUNTER — Encounter: Payer: Self-pay | Admitting: Family Medicine

## 2016-04-14 ENCOUNTER — Ambulatory Visit (INDEPENDENT_AMBULATORY_CARE_PROVIDER_SITE_OTHER): Payer: Medicare Other | Admitting: Family Medicine

## 2016-04-14 VITALS — BP 138/82 | HR 73 | Temp 98.9°F | Ht 65.0 in | Wt 183.0 lb

## 2016-04-14 DIAGNOSIS — Z8639 Personal history of other endocrine, nutritional and metabolic disease: Secondary | ICD-10-CM | POA: Diagnosis not present

## 2016-04-14 DIAGNOSIS — I10 Essential (primary) hypertension: Secondary | ICD-10-CM

## 2016-04-14 DIAGNOSIS — E785 Hyperlipidemia, unspecified: Secondary | ICD-10-CM

## 2016-04-14 LAB — TSH: TSH: 1.61 u[IU]/mL (ref 0.35–4.50)

## 2016-04-14 LAB — LIPID PANEL
CHOLESTEROL: 168 mg/dL (ref 0–200)
HDL: 47.5 mg/dL (ref >39.00)
LDL CALC: 101 mg/dL — AB (ref 0–99)
NonHDL: 120.1
Total CHOL/HDL Ratio: 4
Triglycerides: 94 mg/dL (ref 0.0–149.0)
VLDL: 18.8 mg/dL (ref 0.0–40.0)

## 2016-04-14 LAB — COMPREHENSIVE METABOLIC PANEL
ALK PHOS: 68 U/L (ref 39–117)
ALT: 14 U/L (ref 0–35)
AST: 18 U/L (ref 0–37)
Albumin: 3.9 g/dL (ref 3.5–5.2)
BUN: 19 mg/dL (ref 6–23)
CALCIUM: 9.5 mg/dL (ref 8.4–10.5)
CHLORIDE: 102 meq/L (ref 96–112)
CO2: 34 mEq/L — ABNORMAL HIGH (ref 19–32)
CREATININE: 1.28 mg/dL — AB (ref 0.40–1.20)
GFR: 53.26 mL/min — ABNORMAL LOW (ref >60.00)
Glucose, Bld: 89 mg/dL (ref 70–99)
Potassium: 3.5 mEq/L (ref 3.5–5.1)
Sodium: 139 mEq/L (ref 135–145)
TOTAL PROTEIN: 7.3 g/dL (ref 6.0–8.3)
Total Bilirubin: 0.4 mg/dL (ref 0.2–1.2)

## 2016-04-14 MED ORDER — HYDROCHLOROTHIAZIDE 25 MG PO TABS: 25.0000 mg | ORAL_TABLET | Freq: Every day | ORAL | 11 refills | Status: DC

## 2016-04-14 NOTE — Progress Notes (Signed)
Patient ID: Wendy Christensen, female    DOB: [birth year]  Age: 70 y.o. MRN: [id number]    Subjective:  Subjective  HPI Wendy Christensen presents for f/u bp , cholesterol   Review of Systems  Constitutional: Negative for appetite change, diaphoresis, fatigue and unexpected weight change.  Eyes: Negative for pain, redness and visual disturbance.  Respiratory: Negative for cough, chest tightness, shortness of breath and wheezing.   Cardiovascular: Negative for chest pain, palpitations and leg swelling.  Endocrine: Negative for cold intolerance, heat intolerance, polydipsia, polyphagia and polyuria.  Genitourinary: Negative for difficulty urinating, dysuria and frequency.  Neurological: Negative for dizziness, light-headedness, numbness and headaches.    History Past Medical History:  Diagnosis Date  . Allergic rhinitis   . Anxiety   . Asthma, intrinsic   . Depression   . Frequency of urination   . GERD (gastroesophageal reflux disease)   . Hyperlipidemia   . Hypertension   . Kidney stone    Left kidney-calcium   . OSA on CPAP    PT IN PROCESS OF GETTING MACHINE SET UP  . Thyroid goiter   . Urgency of urination   . Wears glasses     She has a past surgical history that includes Tubal ligation (1980's); Dilation and curettage of uterus (1990); Cystoscopy with retrograde pyelogram, ureteroscopy and stent placement (Left, 06/21/2015); Holmium laser application (Left, 54/0/0867); and Stone extraction with basket (Left, 06/21/2015).   Her family history includes Asthma in her father; COPD in her father; Heart disease in her mother; Hypertension in her mother; Kidney cancer in her maternal aunt.She reports that she has never smoked. She has never used smokeless tobacco. She reports that she drinks alcohol. She reports that she does not use drugs.  Current Outpatient Prescriptions on File Prior to Visit  Medication Sig Dispense Refill  . acetaminophen (TYLENOL) 325 MG tablet  Take 650 mg by mouth every 6 (six) hours as needed.    Marland Kitchen albuterol (PROVENTIL HFA;VENTOLIN HFA) 108 (90 Base) MCG/ACT inhaler Inhale 2 puffs into the lungs every 4 (four) hours as needed for wheezing. 1 Inhaler 0  . atorvastatin (LIPITOR) 40 MG tablet Take 1 tablet (40 mg total) by mouth daily. Labs are due now 90 tablet 0  . citalopram (CELEXA) 40 MG tablet Take 1 tablet (40 mg total) by mouth every morning. 90 tablet 1  . fenofibrate 160 MG tablet TAKE 1 TABLET BY MOUTH DAILY (Patient taking differently: TAKE 1 TABLET BY MOUTH DAILY--  TAKES IN PM) 30 tablet 5  . FLOVENT HFA 110 MCG/ACT inhaler Inhale 2 puffs into the lungs daily as needed.   3  . fluticasone (FLONASE) 50 MCG/ACT nasal spray Place 2 sprays into both nostrils as needed.  3  . gentamicin (GARAMYCIN) 0.3 % ophthalmic solution Reported on 09/24/2015  0  . metoprolol (LOPRESSOR) 100 MG tablet Take 1 tablet (100 mg total) by mouth 2 (two) times daily. 180 tablet 1  . montelukast (SINGULAIR) 10 MG tablet Take 1 tablet (10 mg total) by mouth at bedtime. 90 tablet 3  . Multiple Vitamins-Minerals (MULTIVITAMIN WITH MINERALS) tablet Take 1 tablet by mouth daily.    Marland Kitchen omeprazole (PRILOSEC) 20 MG capsule Take 1 capsule (20 mg total) by mouth daily. 90 capsule 3  . omeprazole (PRILOSEC) 20 MG capsule TAKE ONE CAPSULE BY MOUTH DAILY 90 capsule 0  . Propylene Glycol-Glycerin (MOISTURE EYES OP) Apply to eye.    . valsartan (DIOVAN) 320 MG tablet Take 1 tablet (  320 mg total) by mouth daily. 90 tablet 1  . vitamin B-12 (CYANOCOBALAMIN) 1000 MCG tablet Take 1,000 mcg by mouth daily.     No current facility-administered medications on file prior to visit.      Objective:  Objective  Physical Exam  Constitutional: She is oriented to person, place, and time. She appears well-developed and well-nourished.  HENT:  Head: Normocephalic and atraumatic.  Eyes: Conjunctivae and EOM are normal.  Neck: Normal range of motion. Neck supple. No JVD present.  Carotid bruit is not present. No thyromegaly present.  Cardiovascular: Normal rate, regular rhythm and normal heart sounds.   No murmur heard. Pulmonary/Chest: Effort normal and breath sounds normal. No respiratory distress. She has no wheezes. She has no rales. She exhibits no tenderness.  Musculoskeletal: She exhibits edema.       Right foot: There is swelling.       Feet:  Neurological: She is alert and oriented to person, place, and time.  Psychiatric: She has a normal mood and affect. Her behavior is normal. Judgment and thought content normal.  Nursing note and vitals reviewed.  BP 138/82 (BP Location: Left Arm, Patient Position: Sitting, Cuff Size: Normal)   Pulse 73   Temp 98.9 F (37.2 C) (Oral)   Ht _0  (1.651 m)   Wt 183 lb (83 kg)   SpO2 95%   BMI 30.45 kg/m  Wt Readings from Last 3 Encounters:  04/14/16 183 lb (83 kg)  01/05/16 181 lb (82.1 kg)  10/15/15 176 lb 6.4 oz (80 kg)     Lab Results  Component Value Date   WBC 6.1 10/15/2015   HGB 11.8 (L) 10/15/2015   HCT 36.8 10/15/2015   PLT 324.0 10/15/2015   GLUCOSE 101 (H) 06/21/2015   CHOL 348 (H) 10/15/2015   TRIG 135.0 10/15/2015   HDL 47.10 10/15/2015   LDLDIRECT 215.7 10/18/2011   LDLCALC 274 (H) 10/15/2015   ALT 13 10/15/2015   AST 19 10/15/2015   NA 138 08/16/2015   K 3.7 08/16/2015   CL 101 03/23/2015   CREATININE 1.4 (A) 08/16/2015   BUN 19 08/16/2015   CO2 31 03/23/2015   TSH 1.18 11/16/2015   HGBA1C 6.4 09/14/2014   MICROALBUR <0.7 03/23/2015    Dg Chest 2 View  Result Date: 02/18/2016 CLINICAL DATA:  Bronchitis for 2 weeks with shortness of breath EXAM: CHEST  2 VIEW COMPARISON:  01/31/2013 FINDINGS: Stable mild cardiac enlargement with uncoiling of the aorta. The vascular pattern is normal. There is no infiltrate or effusion. IMPRESSION: No active cardiopulmonary disease. Electronically Signed   By: Skipper Cliche M.D.   On: 02/18/2016 13:53     Assessment & Plan:  Plan  I am  having Wendy Christensen start on hydrochlorothiazide. I am also having her maintain her multivitamin with minerals, vitamin B-12, fenofibrate, fluticasone, FLOVENT HFA, acetaminophen, gentamicin, Propylene Glycol-Glycerin (MOISTURE EYES OP), albuterol, omeprazole, citalopram, valsartan, metoprolol, montelukast, atorvastatin, and omeprazole.  Meds ordered this encounter  Medications  . hydrochlorothiazide (HYDRODIURIL) 25 MG tablet    Sig: Take 1 tablet (25 mg total) by mouth daily. 1 po qd prn    Dispense:  30 tablet    Refill:  11    Problem List Items Addressed This Visit      Unprioritized   Essential hypertension    Slightly elevated today Add hctz  rto 6 months or sooner prn Pt instructed to drink oj or banana to help with K  Relevant Medications   hydrochlorothiazide (HYDRODIURIL) 25 MG tablet   Other Relevant Orders   Comprehensive metabolic panel   Lipid panel    Other Visit Diagnoses    Hyperlipidemia    -  Primary   Relevant Medications   hydrochlorothiazide (HYDRODIURIL) 25 MG tablet   Other Relevant Orders   Lipid panel   Comp Met (CMET)   Comprehensive metabolic panel   Lipid panel   History of hypothyroidism       Relevant Orders   TSH      Follow-up: Return in about 6 months (around 10/15/2016) for annual exam, fasting.  [name], DO

## 2016-04-14 NOTE — Patient Instructions (Signed)

## 2016-04-14 NOTE — Assessment & Plan Note (Signed)
Slightly elevated today Add hctz  rto 6 months or sooner prn Pt instructed to drink oj or banana to help with K

## 2016-04-14 NOTE — Progress Notes (Signed)
Pre visit review using our clinic review tool, if applicable. No additional management support is needed unless otherwise documented below in the visit note. 

## 2016-04-28 ENCOUNTER — Other Ambulatory Visit: Payer: Self-pay | Admitting: Family Medicine

## 2016-05-04 DIAGNOSIS — G4733 Obstructive sleep apnea (adult) (pediatric): Secondary | ICD-10-CM | POA: Diagnosis not present

## 2016-05-12 DIAGNOSIS — G4733 Obstructive sleep apnea (adult) (pediatric): Secondary | ICD-10-CM | POA: Diagnosis not present

## 2016-05-14 DIAGNOSIS — G4733 Obstructive sleep apnea (adult) (pediatric): Secondary | ICD-10-CM | POA: Diagnosis not present

## 2016-06-09 ENCOUNTER — Other Ambulatory Visit: Payer: Self-pay | Admitting: Family Medicine

## 2016-06-13 DIAGNOSIS — G4733 Obstructive sleep apnea (adult) (pediatric): Secondary | ICD-10-CM | POA: Diagnosis not present

## 2016-06-14 DIAGNOSIS — G4733 Obstructive sleep apnea (adult) (pediatric): Secondary | ICD-10-CM | POA: Diagnosis not present

## 2016-07-06 ENCOUNTER — Ambulatory Visit: Payer: Self-pay | Admitting: *Deleted

## 2016-07-14 DIAGNOSIS — G4733 Obstructive sleep apnea (adult) (pediatric): Secondary | ICD-10-CM | POA: Diagnosis not present

## 2016-07-17 ENCOUNTER — Other Ambulatory Visit: Payer: Self-pay | Admitting: Family Medicine

## 2016-07-17 DIAGNOSIS — Z1231 Encounter for screening mammogram for malignant neoplasm of breast: Secondary | ICD-10-CM

## 2016-07-30 ENCOUNTER — Other Ambulatory Visit: Payer: Self-pay | Admitting: Family Medicine

## 2016-07-31 NOTE — Telephone Encounter (Signed)
Medication filled to pharmacy as requested.   

## 2016-08-15 ENCOUNTER — Ambulatory Visit: Payer: Self-pay

## 2016-09-29 ENCOUNTER — Encounter: Payer: Self-pay | Admitting: Gastroenterology

## 2016-10-17 ENCOUNTER — Encounter: Payer: Self-pay | Admitting: Family Medicine

## 2016-10-29 ENCOUNTER — Other Ambulatory Visit: Payer: Self-pay | Admitting: Family Medicine

## 2016-10-30 DIAGNOSIS — Z01411 Encounter for gynecological examination (general) (routine) with abnormal findings: Secondary | ICD-10-CM | POA: Diagnosis not present

## 2016-10-30 DIAGNOSIS — Z1231 Encounter for screening mammogram for malignant neoplasm of breast: Secondary | ICD-10-CM | POA: Diagnosis not present

## 2016-10-30 DIAGNOSIS — Z124 Encounter for screening for malignant neoplasm of cervix: Secondary | ICD-10-CM | POA: Diagnosis not present

## 2016-11-16 ENCOUNTER — Telehealth: Payer: Self-pay | Admitting: *Deleted

## 2016-11-16 NOTE — Telephone Encounter (Signed)
Pt declined AWV w/ [facility] stating that she always gets it done at home through [facility].

## 2016-11-17 ENCOUNTER — Ambulatory Visit (INDEPENDENT_AMBULATORY_CARE_PROVIDER_SITE_OTHER): Payer: Medicare Other | Admitting: Family Medicine

## 2016-11-17 ENCOUNTER — Encounter: Payer: Self-pay | Admitting: Family Medicine

## 2016-11-17 VITALS — BP 144/82 | HR 52 | Temp 98.3°F | Resp 16 | Ht 65.0 in | Wt 184.8 lb

## 2016-11-17 DIAGNOSIS — J069 Acute upper respiratory infection, unspecified: Secondary | ICD-10-CM | POA: Diagnosis not present

## 2016-11-17 DIAGNOSIS — B9789 Other viral agents as the cause of diseases classified elsewhere: Secondary | ICD-10-CM

## 2016-11-17 DIAGNOSIS — E785 Hyperlipidemia, unspecified: Secondary | ICD-10-CM

## 2016-11-17 DIAGNOSIS — Z0000 Encounter for general adult medical examination without abnormal findings: Secondary | ICD-10-CM

## 2016-11-17 DIAGNOSIS — I10 Essential (primary) hypertension: Secondary | ICD-10-CM

## 2016-11-17 DIAGNOSIS — E039 Hypothyroidism, unspecified: Secondary | ICD-10-CM

## 2016-11-17 DIAGNOSIS — F411 Generalized anxiety disorder: Secondary | ICD-10-CM

## 2016-11-17 DIAGNOSIS — Z23 Encounter for immunization: Secondary | ICD-10-CM

## 2016-11-17 LAB — CBC
HEMATOCRIT: 37.5 % (ref 35.0–45.0)
HEMOGLOBIN: 12 g/dL (ref 11.7–15.5)
MCH: 26.7 pg — ABNORMAL LOW (ref 27.0–33.0)
MCHC: 32 g/dL (ref 32.0–36.0)
MCV: 83.3 fL (ref 80.0–100.0)
MPV: 9.3 fL (ref 7.5–12.5)
Platelets: 294 10*3/uL (ref 140–400)
RBC: 4.5 MIL/uL (ref 3.80–5.10)
RDW: 13.9 % (ref 11.0–15.0)
WBC: 6.4 10*3/uL (ref 3.8–10.8)

## 2016-11-17 LAB — COMPREHENSIVE METABOLIC PANEL
ALBUMIN: 4 g/dL (ref 3.6–5.1)
ALK PHOS: 73 U/L (ref 33–130)
ALT: 13 U/L (ref 6–29)
AST: 18 U/L (ref 10–35)
BILIRUBIN TOTAL: 0.5 mg/dL (ref 0.2–1.2)
BUN: 15 mg/dL (ref 7–25)
CALCIUM: 9.2 mg/dL (ref 8.6–10.4)
CO2: 28 mmol/L (ref 20–31)
CREATININE: 1.33 mg/dL — AB (ref 0.50–0.99)
Chloride: 102 mmol/L (ref 98–110)
GLUCOSE: 77 mg/dL (ref 65–99)
Potassium: 4.3 mmol/L (ref 3.5–5.3)
SODIUM: 140 mmol/L (ref 135–146)
Total Protein: 7.2 g/dL (ref 6.1–8.1)

## 2016-11-17 LAB — LIPID PANEL
Cholesterol: 175 mg/dL (ref <200)
HDL: 52 mg/dL (ref >50)
LDL Cholesterol: 104 mg/dL — ABNORMAL HIGH (ref <100)
Total CHOL/HDL Ratio: 3.4 Ratio (ref <5.0)
Triglycerides: 93 mg/dL (ref <150)
VLDL: 19 mg/dL (ref <30)

## 2016-11-17 LAB — TSH: TSH: 1.24 m[IU]/L

## 2016-11-17 MED ORDER — ATORVASTATIN CALCIUM 40 MG PO TABS: 40.0000 mg | ORAL_TABLET | Freq: Every day | ORAL | 1 refills | Status: DC

## 2016-11-17 MED ORDER — VALSARTAN 320 MG PO TABS: 320.0000 mg | ORAL_TABLET | Freq: Every day | ORAL | 1 refills | Status: DC

## 2016-11-17 MED ORDER — FENOFIBRATE 160 MG PO TABS: 160.0000 mg | ORAL_TABLET | Freq: Every day | ORAL | 1 refills | Status: DC

## 2016-11-17 MED ORDER — ALBUTEROL SULFATE HFA 108 (90 BASE) MCG/ACT IN AERS: 2.0000 | INHALATION_SPRAY | RESPIRATORY_TRACT | 0 refills | Status: DC | PRN

## 2016-11-17 MED ORDER — ZOSTER VAC RECOMB ADJUVANTED 50 MCG/0.5ML IM SUSR: 50.0000 ug | Freq: Every day | INTRAMUSCULAR | 1 refills | Status: DC

## 2016-11-17 MED ORDER — CITALOPRAM HYDROBROMIDE 40 MG PO TABS: 40.0000 mg | ORAL_TABLET | Freq: Every morning | ORAL | 1 refills | Status: DC

## 2016-11-17 MED ORDER — METOPROLOL TARTRATE 100 MG PO TABS: 100.0000 mg | ORAL_TABLET | Freq: Two times a day (BID) | ORAL | 1 refills | Status: DC

## 2016-11-17 MED ORDER — HYDROCHLOROTHIAZIDE 25 MG PO TABS: 25.0000 mg | ORAL_TABLET | Freq: Every day | ORAL | 1 refills | Status: DC

## 2016-11-17 NOTE — Progress Notes (Signed)
Subjective:     Wendy Christensen is a 71 y.o. female and is here for a comprehensive physical exam. The patient reports no problems.  Social History   Social History  . Marital status: Married    Spouse name: N/A  . Number of children: N/A  . Years of education: N/A   Occupational History  . Retired Research officer, trade [employer]   Social History Main Topics  . Smoking status: Never Smoker  . Smokeless tobacco: Never Used  . Alcohol use 0.0 oz/week     Comment: 1 beer every once in awhile   . Drug use: No  . Sexual activity: Yes    Partners: Male     Comment: Husband    Other Topics Concern  . 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 Samul Dada  04/19/2015  . COLONOSCOPY  02/17/2017 (Originally 11/13/2016)  . MAMMOGRAM  07/28/2017  . INFLUENZA VACCINE  Addressed  . DEXA SCAN  Completed  . Hepatitis C Screening  Completed  . PNA vac Low Risk Adult  Completed    The following portions of the patient's history were reviewed and updated as appropriate:  She  has a past medical history of Allergic rhinitis; Anxiety; Asthma, intrinsic; Depression; Frequency of urination; GERD (gastroesophageal reflux disease); Hyperlipidemia; Hypertension; Kidney stone; OSA on CPAP; Thyroid goiter; Urgency of urination; and Wears glasses. She  does not have any pertinent problems on file. She  has a past surgical history that includes Tubal ligation (1980's); Dilation and curettage of uterus (1990); Cystoscopy with retrograde pyelogram, ureteroscopy and stent placement (Left, 06/21/2015); Holmium laser application (Left, 0000000); and Stone extraction with basket (Left, 06/21/2015). Her family history includes Asthma in her father; COPD in her father; Heart disease in her mother; Hypertension in her mother; Kidney cancer in her maternal aunt. She  reports that she has never smoked. She has never used smokeless tobacco. She  reports that she drinks alcohol. She reports that she does not use drugs. She has a current medication list which includes the following prescription(s): acetaminophen, albuterol, atorvastatin, citalopram, fenofibrate, flovent hfa, fluticasone, gentamicin, hydrochlorothiazide, metoprolol, montelukast, multivitamin with minerals, omeprazole, propylene glycol-glycerin, valsartan, and vitamin b-12. Current Outpatient Prescriptions on File Prior to Visit  Medication Sig Dispense Refill  . acetaminophen (TYLENOL) 325 MG tablet Take 650 mg by mouth every 6 (six) hours as needed.    Marland Kitchen albuterol (PROVENTIL HFA;VENTOLIN HFA) 108 (90 Base) MCG/ACT inhaler Inhale 2 puffs into the lungs every 4 (four) hours as needed for wheezing. 1 Inhaler 0  . atorvastatin (LIPITOR) 40 MG tablet TAKE 1 TABLET BY MOUTH EVERY DAY 90 tablet 0  . citalopram (CELEXA) 40 MG tablet Take 1 tablet (40 mg total) by mouth every morning. 90 tablet 1  . fenofibrate 160 MG tablet TAKE 1 TABLET BY MOUTH DAILY (Patient taking differently: TAKE 1 TABLET BY MOUTH DAILY--  TAKES IN PM) 30 tablet 5  . FLOVENT HFA 110 MCG/ACT inhaler Inhale 2 puffs into the lungs daily as needed.   3  . fluticasone (FLONASE) 50 MCG/ACT nasal spray Place 2 sprays into both nostrils as needed.  3  . gentamicin (GARAMYCIN) 0.3 % ophthalmic solution Reported on 09/24/2015  0  . hydrochlorothiazide (HYDRODIURIL) 25 MG tablet Take 1 tablet (25 mg total) by mouth daily. 1 po qd prn 30 tablet 11  . metoprolol (LOPRESSOR) 100 MG tablet Take 1 tablet (100  mg total) by mouth 2 (two) times daily. 180 tablet 1  . montelukast (SINGULAIR) 10 MG tablet Take 1 tablet (10 mg total) by mouth at bedtime. 90 tablet 3  . Multiple Vitamins-Minerals (MULTIVITAMIN WITH MINERALS) tablet Take 1 tablet by mouth daily.    Marland Kitchen omeprazole (PRILOSEC) 20 MG capsule TAKE ONE CAPSULE BY MOUTH DAILY 90 capsule 3  . Propylene Glycol-Glycerin (MOISTURE EYES OP) Apply to eye.    . valsartan (DIOVAN) 320  MG tablet TAKE 1 TABLET BY MOUTH DAILY 30 tablet 5  . vitamin B-12 (CYANOCOBALAMIN) 1000 MCG tablet Take 1,000 mcg by mouth daily.     No current facility-administered medications on file prior to visit.    She has No Known Allergies..  Review of Systems  Review of Systems  Constitutional: Negative for activity change, appetite change and fatigue.  HENT: Negative for hearing loss, congestion, tinnitus and ear discharge.   Eyes: Negative for visual disturbance (see optho q1y -- vision corrected to 20/20 with glasses).  Respiratory: Negative for cough, chest tightness and shortness of breath.   Cardiovascular: Negative for chest pain, palpitations and leg swelling.  Gastrointestinal: Negative for abdominal pain, diarrhea, constipation and abdominal distention.  Genitourinary: Negative for urgency, frequency, decreased urine volume and difficulty urinating.  Musculoskeletal: Negative for back pain, arthralgias and gait problem.  Skin: Negative for color change, pallor and rash.  Neurological: Negative for dizziness, light-headedness, numbness and headaches.  Hematological: Negative for adenopathy. Does not bruise/bleed easily.  Psychiatric/Behavioral: Negative for suicidal ideas, confusion, sleep disturbance, self-injury, dysphoric mood, decreased concentration and agitation.  Pt is able to read and write and can do all ADLs No risk for falling No abuse/ violence in home    Objective:    BP (!) 144/82 (BP Location: Left Arm, Patient Position: Sitting, Cuff Size: Normal)   Pulse (!) 52   Temp 98.3 F (36.8 C) (Oral)   Resp 16   Ht 5\' 5"  (1.651 m)   Wt 184 lb 12.8 oz (83.8 kg)   SpO2 98%   BMI 30.75 kg/m  General appearance: alert, cooperative, appears stated age and no distress Head: Normocephalic, without obvious abnormality, atraumatic Eyes: conjunctivae/corneas clear. PERRL, EOM's intact. Fundi benign. Ears: normal TM's and external ear canals both ears Nose: Nares normal.  Septum midline. Mucosa normal. No drainage or sinus tenderness. Throat: lips, mucosa, and tongue normal; teeth and gums normal Neck: no adenopathy, no carotid bruit, no JVD, supple, symmetrical, trachea midline and thyroid not enlarged, symmetric, no tenderness/mass/nodules Back: symmetric, no curvature. ROM normal. No CVA tenderness. Lungs: clear to auscultation bilaterally Breasts: gyn Heart: regular rate and rhythm, S1, S2 normal, no murmur, click, rub or gallop Abdomen: soft, non-tender; bowel sounds normal; no masses,  no organomegaly Pelvic: deferred--gyn Extremities: extremities normal, atraumatic, no cyanosis or edema Pulses: 2+ and symmetric Skin: Skin color, texture, turgor normal. No rashes or lesions Lymph nodes: Cervical, supraclavicular, and axillary nodes normal. Neurologic: Alert and oriented X 3, normal strength and tone. Normal symmetric reflexes. Normal coordination and gait    Assessment:    Healthy female exam.      Plan:    ghm utd Check labs  See After Visit Summary for Counseling Recommendations    1. Essential hypertension stable - CBC - Comprehensive metabolic panel - Lipid panel - hydrochlorothiazide (HYDRODIURIL) 25 MG tablet; Take 1 tablet (25 mg total) by mouth daily. 1 po qd prn  Dispense: 90 tablet; Refill: 1 - metoprolol (LOPRESSOR) 100 MG tablet; Take  1 tablet (100 mg total) by mouth 2 (two) times daily.  Dispense: 180 tablet; Refill: 1 - valsartan (DIOVAN) 320 MG tablet; Take 1 tablet (320 mg total) by mouth daily.  Dispense: 90 tablet; Refill: 1  2. Hypothyroidism, unspecified type   - TSH  3. Generalized anxiety disorder   - citalopram (CELEXA) 40 MG tablet; Take 1 tablet (40 mg total) by mouth every morning.  Dispense: 90 tablet; Refill: 1  4. Viral upper respiratory illness   - albuterol (PROVENTIL HFA;VENTOLIN HFA) 108 (90 Base) MCG/ACT inhaler; Inhale 2 puffs into the lungs every 4 (four) hours as needed for wheezing.  Dispense:  1 Inhaler; Refill: 0  5. Hyperlipidemia LDL goal <100   - atorvastatin (LIPITOR) 40 MG tablet; Take 1 tablet (40 mg total) by mouth daily.  Dispense: 90 tablet; Refill: 1 - fenofibrate 160 MG tablet; Take 1 tablet (160 mg total) by mouth daily.  Dispense: 90 tablet; Refill: 1  6. Preventative health care See above - TSH  7. Need for shingles vaccine   - Zoster Vac Recomb Adjuvanted ([place]) 50 MCG SUSR; Inject 50 mcg into the muscle daily.  Dispense: 1 each; Refill: 1

## 2016-11-17 NOTE — Patient Instructions (Signed)
Preventive Care 65 Years and Older, Female Preventive care refers to lifestyle choices and visits with your health care provider that can promote health and wellness. What does preventive care include?  A yearly physical exam. This is also called an annual well check.  Dental exams once or twice a year.  Routine eye exams. Ask your health care provider how often you should have your eyes checked.  Personal lifestyle choices, including:  Daily care of your teeth and gums.  Regular physical activity.  Eating a healthy diet.  Avoiding tobacco and drug use.  Limiting alcohol use.  Practicing safe sex.  Taking low-dose aspirin every day.  Taking vitamin and mineral supplements as recommended by your health care provider. What happens during an annual well check? The services and screenings done by your health care provider during your annual well check will depend on your age, overall health, lifestyle risk factors, and family history of disease. Counseling  Your health care provider may ask you questions about your:  Alcohol use.  Tobacco use.  Drug use.  Emotional well-being.  Home and relationship well-being.  Sexual activity.  Eating habits.  History of falls.  Memory and ability to understand (cognition).  Work and work environment.  Reproductive health. Screening  You may have the following tests or measurements:  Height, weight, and BMI.  Blood pressure.  Lipid and cholesterol levels. These may be checked every 5 years, or more frequently if you are over 50 years old.  Skin check.  Lung cancer screening. You may have this screening every year starting at age 55 if you have a 30-pack-year history of smoking and currently smoke or have quit within the past 15 years.  Fecal occult blood test (FOBT) of the stool. You may have this test every year starting at age 50.  Flexible sigmoidoscopy or colonoscopy. You may have a sigmoidoscopy every 5 years or  a colonoscopy every 10 years starting at age 50.  Hepatitis C blood test.  Hepatitis B blood test.  Sexually transmitted disease (STD) testing.  Diabetes screening. This is done by checking your blood sugar (glucose) after you have not eaten for a while (fasting). You may have this done every 1-3 years.  Bone density scan. This is done to screen for osteoporosis. You may have this done starting at age 65.  Mammogram. This may be done every 1-2 years. Talk to your health care provider about how often you should have regular mammograms. Talk with your health care provider about your test results, treatment options, and if necessary, the need for more tests. Vaccines  Your health care provider may recommend certain vaccines, such as:  Influenza vaccine. This is recommended every year.  Tetanus, diphtheria, and acellular pertussis (Tdap, Td) vaccine. You may need a Td booster every 10 years.  Varicella vaccine. You may need this if you have not been vaccinated.  Zoster vaccine. You may need this after age 60.  Measles, mumps, and rubella (MMR) vaccine. You may need at least one dose of MMR if you were born in 1957 or later. You may also need a second dose.  Pneumococcal 13-valent conjugate (PCV13) vaccine. One dose is recommended after age 65.  Pneumococcal polysaccharide (PPSV23) vaccine. One dose is recommended after age 65.  Meningococcal vaccine. You may need this if you have certain conditions.  Hepatitis A vaccine. You may need this if you have certain conditions or if you travel or work in places where you may be exposed to   hepatitis A.  Hepatitis B vaccine. You may need this if you have certain conditions or if you travel or work in places where you may be exposed to hepatitis B.  Haemophilus influenzae type b (Hib) vaccine. You may need this if you have certain conditions. Talk to your health care provider about which screenings and vaccines you need and how often you need  them. This information is not intended to replace advice given to you by your health care provider. Make sure you discuss any questions you have with your health care provider. Document Released: 10/01/2015 Document Revised: 05/24/2016 Document Reviewed: 07/06/2015 Elsevier Interactive Patient Education  2017 [employer]vier Inc.  

## 2016-11-17 NOTE — Progress Notes (Signed)
Pre visit review using our clinic review tool, if applicable. No additional management support is needed unless otherwise documented below in the visit note. 

## 2016-11-29 DIAGNOSIS — J300 Vasomotor rhinitis: Secondary | ICD-10-CM | POA: Diagnosis not present

## 2016-11-29 DIAGNOSIS — T781XXD Other adverse food reactions, not elsewhere classified, subsequent encounter: Secondary | ICD-10-CM | POA: Diagnosis not present

## 2016-11-29 DIAGNOSIS — J019 Acute sinusitis, unspecified: Secondary | ICD-10-CM | POA: Diagnosis not present

## 2016-11-29 DIAGNOSIS — J4530 Mild persistent asthma, uncomplicated: Secondary | ICD-10-CM | POA: Diagnosis not present

## 2016-12-08 ENCOUNTER — Other Ambulatory Visit: Payer: Self-pay | Admitting: Family Medicine

## 2016-12-14 ENCOUNTER — Other Ambulatory Visit: Payer: Self-pay | Admitting: Family Medicine

## 2016-12-14 DIAGNOSIS — J069 Acute upper respiratory infection, unspecified: Secondary | ICD-10-CM

## 2017-01-26 IMAGING — US US RENAL
1 series · 14 of 25 positions shown · non-contrast
Comparison: Abdominal ultrasound 01/24/2007.

CLINICAL DATA: Patient with elevated creatinine.

EXAM:
RENAL / URINARY TRACT ULTRASOUND COMPLETE

[Series 1: us renal · 0.20mm/px · 14 of 33 slices shown]
[im 1/33]
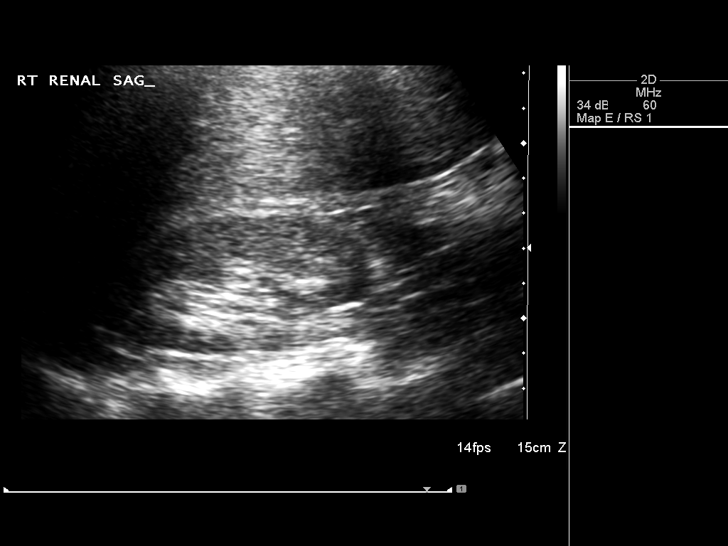
[im 3/33]
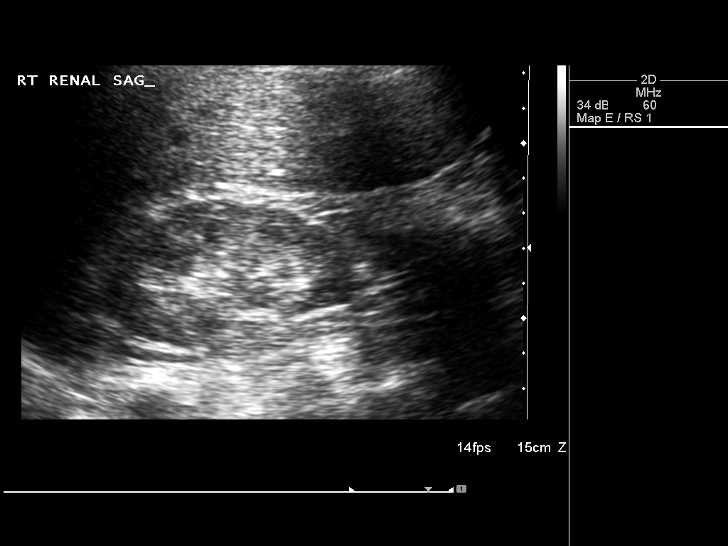
[im 6/33]
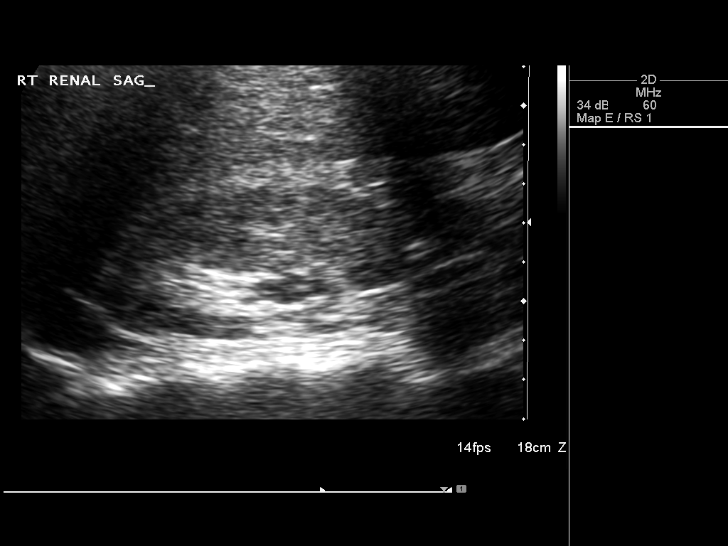
[im 9/33]
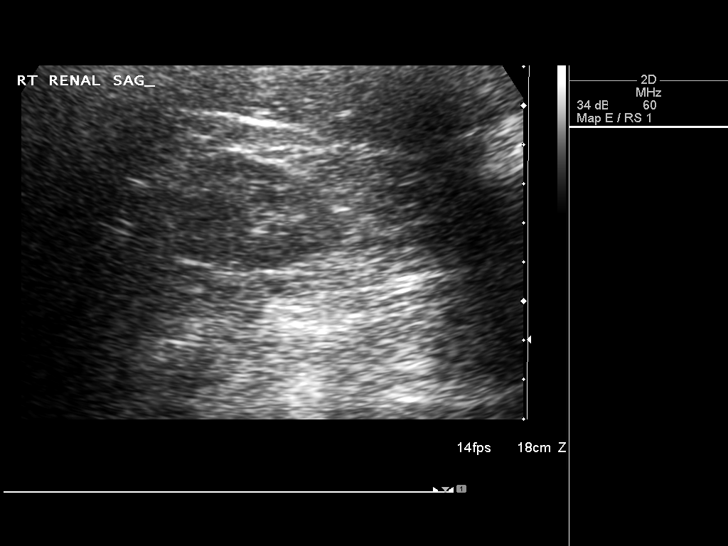
[im 11/33]
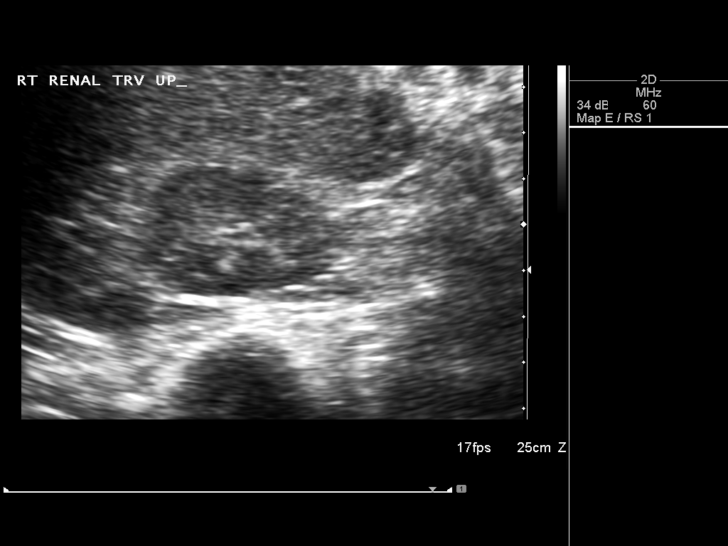
[im 13/33]
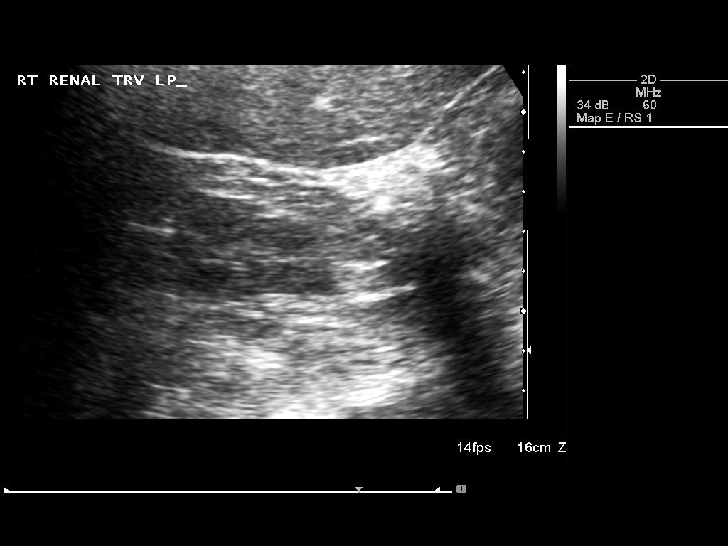
[im 15/33]
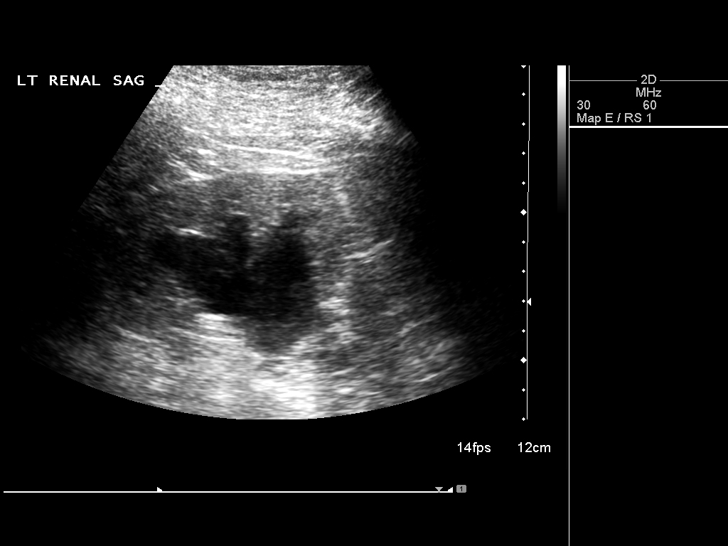
[im 18/33]
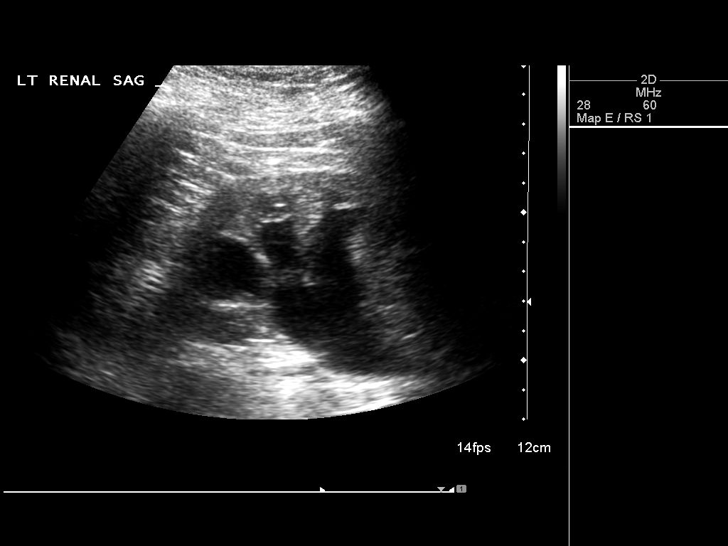
[im 21/33]
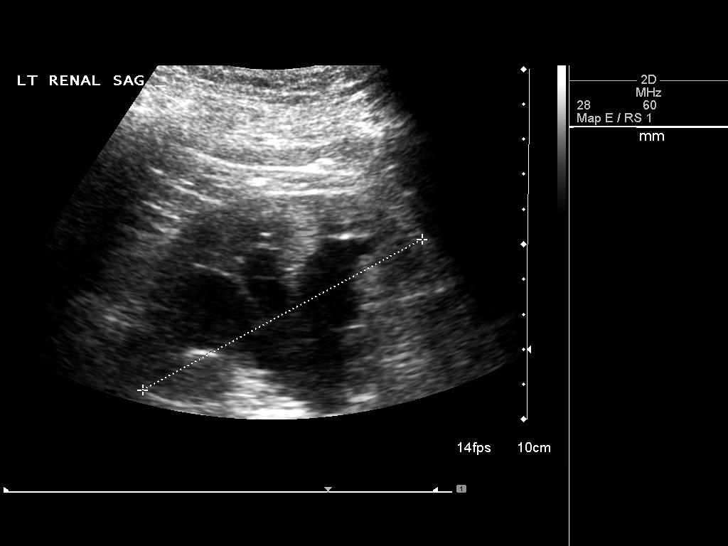
[im 22/33]
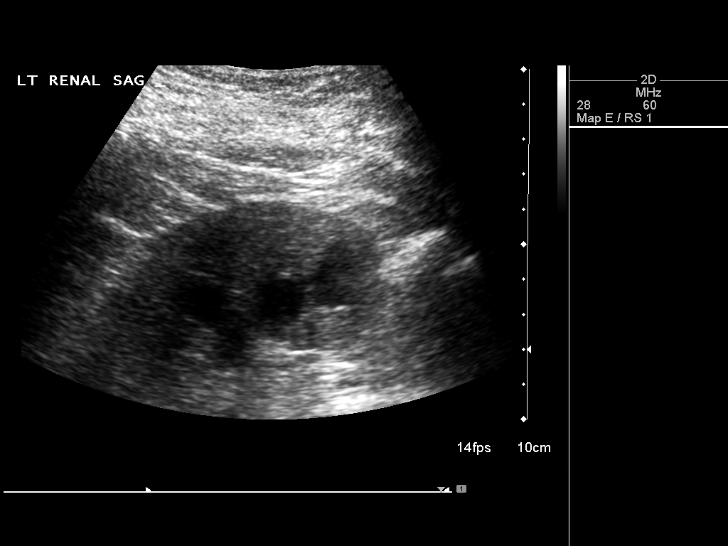
[im 25/33]
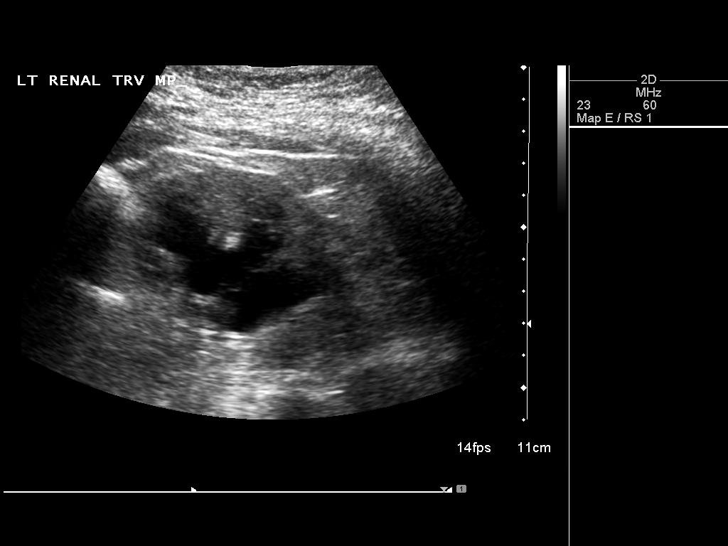
[im 27/33]
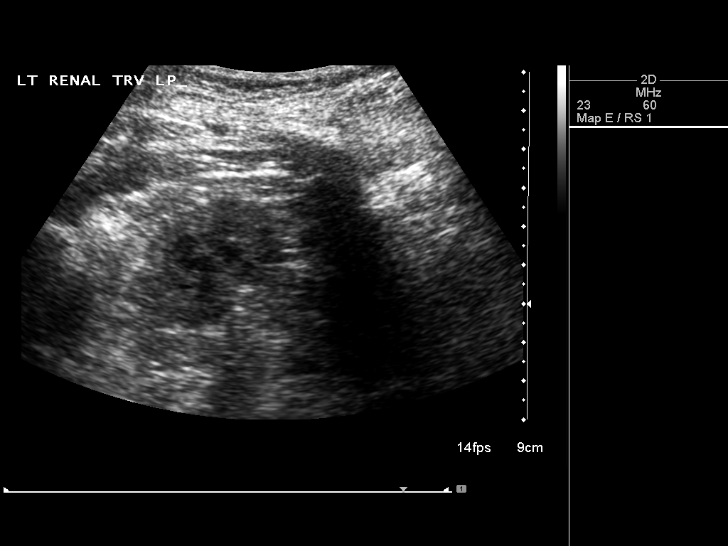
[im 30/33]
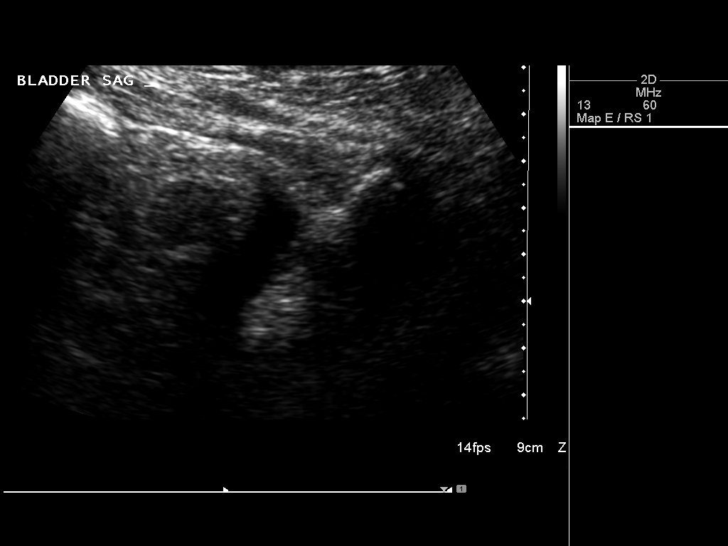
[im 33/33]
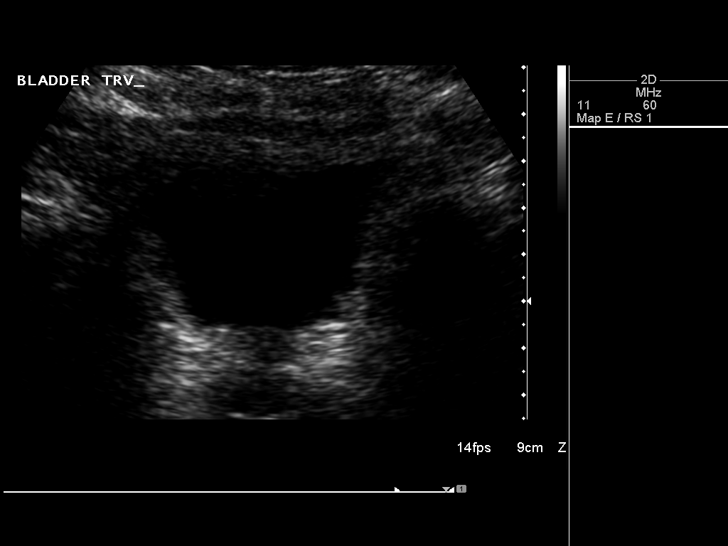

[14 of 25 positions shown; findings below may reference images not displayed]

FINDINGS: Right Kidney:

Length: 8.0 cm. Echogenicity within normal limits. No mass or
hydronephrosis visualized.

Left Kidney:

Length: 9.1 cm. Normal renal cortical thickness and echogenicity.
There is moderate left hydronephrosis.

Bladder:

Appears normal for degree of bladder distention.
IMPRESSION: Moderate left hydronephrosis. Etiology is not demonstrated on
current evaluation. Consider dedicated evaluation with CT.

These results will be called to the ordering clinician or
representative by the Radiologist Assistant, and communication
documented in the PACS or zVision Dashboard.

## 2017-02-08 DIAGNOSIS — G4733 Obstructive sleep apnea (adult) (pediatric): Secondary | ICD-10-CM | POA: Diagnosis not present

## 2017-02-27 ENCOUNTER — Other Ambulatory Visit: Payer: Self-pay | Admitting: Family Medicine

## 2017-04-04 ENCOUNTER — Telehealth: Payer: Self-pay | Admitting: Family Medicine

## 2017-04-04 NOTE — Telephone Encounter (Signed)
Patient will call pharmacy.

## 2017-04-04 NOTE — Telephone Encounter (Signed)
Relation to pt: self Call back number:[phone] Pharmacy: [facility] [place], [place] [phone] (Phone) [phone] (Fax)     Reason for call:  Patient states valsartan (DIOVAN) 320 MG tablet manufactured recall in need of clinical advice and requesting alternate,please advise

## 2017-04-16 NOTE — Telephone Encounter (Signed)
benicar 40 mg #30 1 po qd, 2 refills  Nurse visit for bp check in 2-3 weeks

## 2017-04-16 NOTE — Telephone Encounter (Signed)
Patient received a letter from pharmacy and received a call from pharmacy that medication is on recall, please advise

## 2017-04-17 MED ORDER — OLMESARTAN MEDOXOMIL 20 MG PO TABS: 20.0000 mg | ORAL_TABLET | Freq: Every day | ORAL | 2 refills | Status: DC

## 2017-04-17 NOTE — Addendum Note (Signed)
Addended by: Kem Boroughs D on: 04/17/2017 09:12 AM   Modules accepted: Orders

## 2017-04-17 NOTE — Telephone Encounter (Signed)
Patient notified and she will be in on 05/01/17 for bp check, rx sent in.

## 2017-05-01 ENCOUNTER — Ambulatory Visit (INDEPENDENT_AMBULATORY_CARE_PROVIDER_SITE_OTHER): Payer: Medicare Other | Admitting: Family Medicine

## 2017-05-01 ENCOUNTER — Encounter: Payer: Self-pay | Admitting: Family Medicine

## 2017-05-01 ENCOUNTER — Other Ambulatory Visit: Payer: Self-pay | Admitting: Family Medicine

## 2017-05-01 VITALS — BP 108/68 | HR 63

## 2017-05-01 DIAGNOSIS — I10 Essential (primary) hypertension: Secondary | ICD-10-CM

## 2017-05-01 DIAGNOSIS — J069 Acute upper respiratory infection, unspecified: Secondary | ICD-10-CM

## 2017-05-01 MED ORDER — ALBUTEROL SULFATE HFA 108 (90 BASE) MCG/ACT IN AERS: INHALATION_SPRAY | RESPIRATORY_TRACT | 3 refills | Status: DC

## 2017-05-01 MED ORDER — FLOVENT HFA 110 MCG/ACT IN AERO: 2.0000 | INHALATION_SPRAY | Freq: Every day | RESPIRATORY_TRACT | 3 refills | Status: DC | PRN

## 2017-05-01 NOTE — Telephone Encounter (Signed)
Rx sent to pharmacy. LB 

## 2017-05-01 NOTE — Patient Instructions (Addendum)
Per Dr. Carollee Herter: Continue current medications & regimen. Report any new symptoms to the office. Keep follow-up appointment with PCP on 05/24/17 at 10:15 AM.

## 2017-05-01 NOTE — Progress Notes (Signed)
Pre visit review using our clinic review tool, if applicable. No additional management support is needed unless otherwise documented below in the visit note.  Patient came in office for blood pressure check per telephone note 04/04/17. She voiced adherence with medications & regimen. Patient is asymptomatic; she denies headaches, dizziness & lightheadedness. RN obtained the following readings during today's visit: BP 108/57 P 58 & 108/68 P 63.  Per Dr. Carollee Herter: Continue current medications & regimen. Report any new symptoms to the office. Keep follow-up appointment with PCP on 05/24/17 at 10:15 AM.  Informed patient of the provider's recommendations. She verbalized understanding and did not have any further questions or concerns before leaving the nurse visit.   Reviewed [name], DO

## 2017-05-09 DIAGNOSIS — G4733 Obstructive sleep apnea (adult) (pediatric): Secondary | ICD-10-CM | POA: Diagnosis not present

## 2017-05-16 ENCOUNTER — Other Ambulatory Visit: Payer: Self-pay | Admitting: Family Medicine

## 2017-05-16 DIAGNOSIS — I10 Essential (primary) hypertension: Secondary | ICD-10-CM

## 2017-05-24 ENCOUNTER — Ambulatory Visit (INDEPENDENT_AMBULATORY_CARE_PROVIDER_SITE_OTHER): Payer: Medicare Other | Admitting: Family Medicine

## 2017-05-24 ENCOUNTER — Encounter: Payer: Self-pay | Admitting: Family Medicine

## 2017-05-24 VITALS — BP 136/74 | HR 58 | Temp 97.4°F | Ht 65.0 in | Wt 188.0 lb

## 2017-05-24 DIAGNOSIS — R079 Chest pain, unspecified: Secondary | ICD-10-CM

## 2017-05-24 DIAGNOSIS — E785 Hyperlipidemia, unspecified: Secondary | ICD-10-CM

## 2017-05-24 DIAGNOSIS — I10 Essential (primary) hypertension: Secondary | ICD-10-CM

## 2017-05-24 DIAGNOSIS — Z23 Encounter for immunization: Secondary | ICD-10-CM

## 2017-05-24 DIAGNOSIS — Z86010 Personal history of colonic polyps: Secondary | ICD-10-CM

## 2017-05-24 LAB — LIPID PANEL
Cholesterol: 198 mg/dL (ref 0–200)
HDL: 46.2 mg/dL (ref >39.00)
LDL Cholesterol: 126 mg/dL — ABNORMAL HIGH (ref 0–99)
NonHDL: 151.67
Total CHOL/HDL Ratio: 4
Triglycerides: 127 mg/dL (ref 0.0–149.0)
VLDL: 25.4 mg/dL (ref 0.0–40.0)

## 2017-05-24 LAB — COMPREHENSIVE METABOLIC PANEL
ALBUMIN: 4.1 g/dL (ref 3.5–5.2)
ALK PHOS: 72 U/L (ref 39–117)
ALT: 13 U/L (ref 0–35)
AST: 20 U/L (ref 0–37)
BILIRUBIN TOTAL: 0.4 mg/dL (ref 0.2–1.2)
BUN: 15 mg/dL (ref 6–23)
CO2: 31 mEq/L (ref 19–32)
Calcium: 9.5 mg/dL (ref 8.4–10.5)
Chloride: 100 mEq/L (ref 96–112)
Creatinine, Ser: 1.14 mg/dL (ref 0.40–1.20)
GFR: 60.68 mL/min (ref >60.00)
Glucose, Bld: 91 mg/dL (ref 70–99)
POTASSIUM: 4.3 meq/L (ref 3.5–5.1)
Sodium: 138 mEq/L (ref 135–145)
TOTAL PROTEIN: 7.7 g/dL (ref 6.0–8.3)

## 2017-05-24 NOTE — Progress Notes (Signed)
Patient ID: Wendy Christensen, female    DOB: 1946/03/09  Age: 71 y.o. MRN: [id number]    Subjective:  Subjective  HPI Wendy Christensen presents for f/u and swelling in legs.   No sob,, no palpitations.   She had an episode of cp quick --- did not last long  Review of Systems  Constitutional: Negative for appetite change, diaphoresis, fatigue and unexpected weight change.  Eyes: Negative for pain, redness and visual disturbance.  Respiratory: Negative for cough, chest tightness, shortness of breath and wheezing.   Cardiovascular: Positive for chest pain and leg swelling. Negative for palpitations.  Endocrine: Negative for cold intolerance, heat intolerance, polydipsia, polyphagia and polyuria.  Genitourinary: Negative for difficulty urinating, dysuria and frequency.  Neurological: Negative for dizziness, light-headedness, numbness and headaches.    History Past Medical History:  Diagnosis Date  . Allergic rhinitis   . Anxiety   . Asthma, intrinsic   . Depression   . Frequency of urination   . GERD (gastroesophageal reflux disease)   . Hyperlipidemia   . Hypertension   . Kidney stone    Left kidney-calcium   . OSA on CPAP    PT IN PROCESS OF GETTING MACHINE SET UP  . Thyroid goiter   . Urgency of urination   . Wears glasses     She has a past surgical history that includes Tubal ligation (1980's); Dilation and curettage of uterus (1990); Cystoscopy with retrograde pyelogram, ureteroscopy and stent placement (Left, 06/21/2015); Holmium laser application (Left, 73/01/3298); and Stone extraction with basket (Left, 06/21/2015).   Her family history includes Asthma in her father; COPD in her father; Heart disease in her mother; Hyperlipidemia in her unknown relative; Hypertension in her mother and unknown relative; Kidney cancer in her maternal aunt.She reports that she has never smoked. She has never used smokeless tobacco. She reports that she drinks alcohol. She reports that  she does not use drugs.  Current Outpatient Prescriptions on File Prior to Visit  Medication Sig Dispense Refill  . acetaminophen (TYLENOL) 325 MG tablet Take 650 mg by mouth every 6 (six) hours as needed.    Marland Kitchen albuterol (PROAIR HFA) 108 (90 Base) MCG/ACT inhaler INHALE 2 PUFFS INTO THE LUNGS EVERY 4 HOURS AS NEEDED FOR WHEEZING 8.5 g 3  . atorvastatin (LIPITOR) 40 MG tablet Take 1 tablet (40 mg total) by mouth daily. 90 tablet 1  . citalopram (CELEXA) 40 MG tablet Take 1 tablet (40 mg total) by mouth every morning. 90 tablet 1  . fenofibrate 160 MG tablet Take 1 tablet (160 mg total) by mouth daily. 90 tablet 1  . FLOVENT HFA 110 MCG/ACT inhaler Inhale 2 puffs into the lungs daily as needed. 1 Inhaler 3  . fluticasone (FLONASE) 50 MCG/ACT nasal spray Place 2 sprays into both nostrils as needed.  3  . gentamicin (GARAMYCIN) 0.3 % ophthalmic solution Reported on 09/24/2015  0  . hydrochlorothiazide (HYDRODIURIL) 25 MG tablet Take 1 tablet (25 mg total) by mouth daily. 1 po qd prn 90 tablet 1  . metoprolol (LOPRESSOR) 100 MG tablet Take 1 tablet (100 mg total) by mouth 2 (two) times daily. 180 tablet 1  . montelukast (SINGULAIR) 10 MG tablet Take 1 tablet (10 mg total) by mouth at bedtime. 90 tablet 3  . Multiple Vitamins-Minerals (MULTIVITAMIN WITH MINERALS) tablet Take 1 tablet by mouth daily.    Marland Kitchen olmesartan (BENICAR) 20 MG tablet Take 1 tablet (20 mg total) by mouth daily. 30 tablet 2  .  omeprazole (PRILOSEC) 20 MG capsule TAKE 1 CAPSULE BY MOUTH ONCE DAILY 90 capsule 0  . Propylene Glycol-Glycerin (MOISTURE EYES OP) Apply to eye.    . vitamin B-12 (CYANOCOBALAMIN) 1000 MCG tablet Take 1,000 mcg by mouth daily.    Marland Kitchen Zoster Vac Recomb Adjuvanted (SHINGRIX) 50 MCG SUSR Inject 50 mcg into the muscle daily. (Patient not taking: Reported on 05/24/2017) 1 each 1   No current facility-administered medications on file prior to visit.      Objective:  Objective  Physical Exam  Constitutional: She is  oriented to person, place, and time. She appears well-developed and well-nourished.  HENT:  Head: Normocephalic and atraumatic.  Eyes: Conjunctivae and EOM are normal.  Neck: Normal range of motion. Neck supple. No JVD present. Carotid bruit is not present. No thyromegaly present.  Cardiovascular: Normal rate, regular rhythm and normal heart sounds.   No murmur heard. Pulmonary/Chest: Effort normal and breath sounds normal. No respiratory distress. She has no wheezes. She has no rales. She exhibits no tenderness.  Musculoskeletal: She exhibits no edema.  Neurological: She is alert and oriented to person, place, and time.  Psychiatric: She has a normal mood and affect. Her behavior is normal. Judgment and thought content normal.  Nursing note and vitals reviewed.  BP 136/74 (BP Location: Right Arm, Patient Position: Sitting, Cuff Size: Normal)   Pulse (!) 58   Temp (!) 97.4 F (36.3 C) (Oral)   Ht 5\' 5"  (1.651 m)   Wt 188 lb (85.3 kg)   SpO2 96%   BMI 31.28 kg/m  Wt Readings from Last 3 Encounters:  05/24/17 188 lb (85.3 kg)  11/17/16 184 lb 12.8 oz (83.8 kg)  04/14/16 183 lb (83 kg)     Lab Results  Component Value Date   WBC 6.4 11/17/2016   HGB 12.0 11/17/2016   HCT 37.5 11/17/2016   PLT 294 11/17/2016   GLUCOSE 91 05/24/2017   CHOL 198 05/24/2017   TRIG 127.0 05/24/2017   HDL 46.20 05/24/2017   LDLDIRECT 215.7 10/18/2011   LDLCALC 126 (H) 05/24/2017   ALT 13 05/24/2017   AST 20 05/24/2017   NA 138 05/24/2017   K 4.3 05/24/2017   CL 100 05/24/2017   CREATININE 1.14 05/24/2017   BUN 15 05/24/2017   CO2 31 05/24/2017   TSH 1.24 11/17/2016   HGBA1C 6.4 09/14/2014   MICROALBUR <0.7 03/23/2015    Dg Chest 2 View  Result Date: 02/18/2016 CLINICAL DATA:  Bronchitis for 2 weeks with shortness of breath EXAM: CHEST  2 VIEW COMPARISON:  01/31/2013 FINDINGS: Stable mild cardiac enlargement with uncoiling of the aorta. The vascular pattern is normal. There is no  infiltrate or effusion. IMPRESSION: No active cardiopulmonary disease. Electronically Signed   By: Skipper Cliche M.D.   On: 02/18/2016 13:53     Assessment & Plan:  Plan  I am having Ms. Richrd Sox maintain her multivitamin with minerals, vitamin B-12, fluticasone, acetaminophen, gentamicin, Propylene Glycol-Glycerin (MOISTURE EYES OP), montelukast, citalopram, atorvastatin, fenofibrate, hydrochlorothiazide, metoprolol tartrate, Zoster Vac Recomb Adjuvanted, olmesartan, omeprazole, FLOVENT HFA, and albuterol.  No orders of the defined types were placed in this encounter.   Problem List Items Addressed This Visit      Unprioritized   HTN (hypertension)   Relevant Orders   Lipid panel (Completed)   Comprehensive metabolic panel (Completed)    Other Visit Diagnoses    Chest pain, unspecified type    -  Primary   Relevant Orders  EKG 12-Lead (Completed)   ECHOCARDIOGRAM COMPLETE   History of colon polyps       Relevant Orders   Ambulatory referral to Gastroenterology   Hyperlipidemia LDL goal <100       Relevant Orders   Lipid panel (Completed)   Comprehensive metabolic panel (Completed)   Need for prophylactic vaccination and inoculation against influenza       Relevant Orders   Flu vaccine HIGH DOSE PF (Fluzone High dose) (Completed)      Follow-up: Return in about 6 months (around 11/21/2017).  [name], DO

## 2017-05-24 NOTE — Patient Instructions (Signed)

## 2017-05-25 DIAGNOSIS — R079 Chest pain, unspecified: Secondary | ICD-10-CM | POA: Insufficient documentation

## 2017-05-25 DIAGNOSIS — Z860100 Personal history of colon polyps, unspecified: Secondary | ICD-10-CM | POA: Insufficient documentation

## 2017-05-25 NOTE — Assessment & Plan Note (Signed)
Tolerating statin, encouraged heart healthy diet, avoid trans fats, minimize simple carbs and saturated fats. Increase exercise as tolerated 

## 2017-05-25 NOTE — Assessment & Plan Note (Signed)
Well controlled, no changes to meds. Encouraged heart healthy diet such as the DASH diet and exercise as tolerated.  °

## 2017-05-25 NOTE — Assessment & Plan Note (Signed)
ekg -- nsr If occurs again -- go to er  

## 2017-05-28 ENCOUNTER — Other Ambulatory Visit: Payer: Self-pay | Admitting: Family Medicine

## 2017-05-28 ENCOUNTER — Other Ambulatory Visit: Payer: Self-pay

## 2017-05-28 MED ORDER — OLMESARTAN MEDOXOMIL 20 MG PO TABS: 20.0000 mg | ORAL_TABLET | Freq: Every day | ORAL | 6 refills | Status: DC

## 2017-05-30 ENCOUNTER — Ambulatory Visit (HOSPITAL_BASED_OUTPATIENT_CLINIC_OR_DEPARTMENT_OTHER)
Admission: RE | Admit: 2017-05-30 | Discharge: 2017-05-30 | Disposition: A | Discharge status: Home / Self Care | Payer: Medicare Other | Source: Ambulatory Visit | Attending: Family Medicine | Admitting: Family Medicine

## 2017-05-30 DIAGNOSIS — I081 Rheumatic disorders of both mitral and tricuspid valves: Secondary | ICD-10-CM | POA: Insufficient documentation

## 2017-05-30 DIAGNOSIS — R079 Chest pain, unspecified: Secondary | ICD-10-CM | POA: Insufficient documentation

## 2017-05-30 DIAGNOSIS — I10 Essential (primary) hypertension: Secondary | ICD-10-CM | POA: Diagnosis not present

## 2017-05-30 NOTE — Progress Notes (Signed)
  Echocardiogram 2D Echocardiogram has been performed.  Tresa Res 05/30/2017, 10:13 AM

## 2017-05-31 LAB — ECHOCARDIOGRAM COMPLETE
CHL CUP STROKE VOLUME: 63 mL
E/e' ratio: 9.6
EWDT: 165 ms
FS: 25 % — AB (ref 28–44)
IVS/LV PW RATIO, ED: 0.97
LA diam end sys: 38 mm
LA diam index: 1.89 cm/m2
LA vol A4C: 53.5 ml
LA vol: 50.7 mL
LASIZE: 38 mm
LAVOLIN: 25.3 mL/m2
LDCA: 2.54 cm2
LV E/e' medial: 9.6
LV SIMPSON'S DISK: 64
LV TDI E'MEDIAL: 6.74
LV dias vol index: 49 mL/m2
LV dias vol: 99 mL (ref 46–106)
LV e' LATERAL: 8.92 cm/s
LVEEAVG: 9.6
LVOT VTI: 25.2 cm
LVOT peak vel: 90.6 cm/s
LVOTD: 18 mm
LVOTSV: 64 mL
LVSYSVOL: 35 mL (ref 14–42)
LVSYSVOLIN: 18 mL/m2
MV Dec: 165
MV Peak grad: 3 mmHg
MV pk A vel: 59.9 m/s
MVPKEVEL: 85.6 m/s
PW: 10.7 mm — AB (ref 0.6–1.1)
RV LATERAL S' VELOCITY: 9.46 cm/s
RV TAPSE: 24.8 mm
RV sys press: 43 mmHg
Reg peak vel: 295 cm/s
TDI e' lateral: 8.92
TR max vel: 295 cm/s

## 2017-06-03 ENCOUNTER — Other Ambulatory Visit: Payer: Self-pay | Admitting: Family Medicine

## 2017-06-21 DIAGNOSIS — I10 Essential (primary) hypertension: Secondary | ICD-10-CM | POA: Diagnosis not present

## 2017-06-21 DIAGNOSIS — N200 Calculus of kidney: Secondary | ICD-10-CM | POA: Diagnosis not present

## 2017-06-21 DIAGNOSIS — N183 Chronic kidney disease, stage 3 (moderate): Secondary | ICD-10-CM | POA: Diagnosis not present

## 2017-06-25 ENCOUNTER — Encounter: Payer: Self-pay | Admitting: Gastroenterology

## 2017-06-30 ENCOUNTER — Other Ambulatory Visit: Payer: Self-pay | Admitting: Family Medicine

## 2017-06-30 DIAGNOSIS — E785 Hyperlipidemia, unspecified: Secondary | ICD-10-CM

## 2017-07-04 DIAGNOSIS — T781XXD Other adverse food reactions, not elsewhere classified, subsequent encounter: Secondary | ICD-10-CM | POA: Diagnosis not present

## 2017-07-04 DIAGNOSIS — J300 Vasomotor rhinitis: Secondary | ICD-10-CM | POA: Diagnosis not present

## 2017-07-04 DIAGNOSIS — J4530 Mild persistent asthma, uncomplicated: Secondary | ICD-10-CM | POA: Diagnosis not present

## 2017-07-23 ENCOUNTER — Ambulatory Visit: Payer: Self-pay | Admitting: Family Medicine

## 2017-07-23 DIAGNOSIS — Z0289 Encounter for other administrative examinations: Secondary | ICD-10-CM

## 2017-07-31 ENCOUNTER — Other Ambulatory Visit: Payer: Self-pay | Admitting: Family Medicine

## 2017-08-02 ENCOUNTER — Other Ambulatory Visit: Payer: Self-pay | Admitting: Family Medicine

## 2017-08-20 ENCOUNTER — Encounter: Payer: Self-pay | Admitting: Gastroenterology

## 2017-09-03 ENCOUNTER — Encounter: Payer: Self-pay | Admitting: Family Medicine

## 2017-10-29 ENCOUNTER — Other Ambulatory Visit: Payer: Self-pay | Admitting: Family Medicine

## 2017-10-29 DIAGNOSIS — F411 Generalized anxiety disorder: Secondary | ICD-10-CM

## 2017-10-29 IMAGING — CR DG CHEST 2V
2 series · 2 of 2 positions shown · non-contrast
Comparison: 01/31/2013

CLINICAL DATA: Bronchitis for 2 weeks with shortness of breath

EXAM:
CHEST  2 VIEW

[w chest pa]
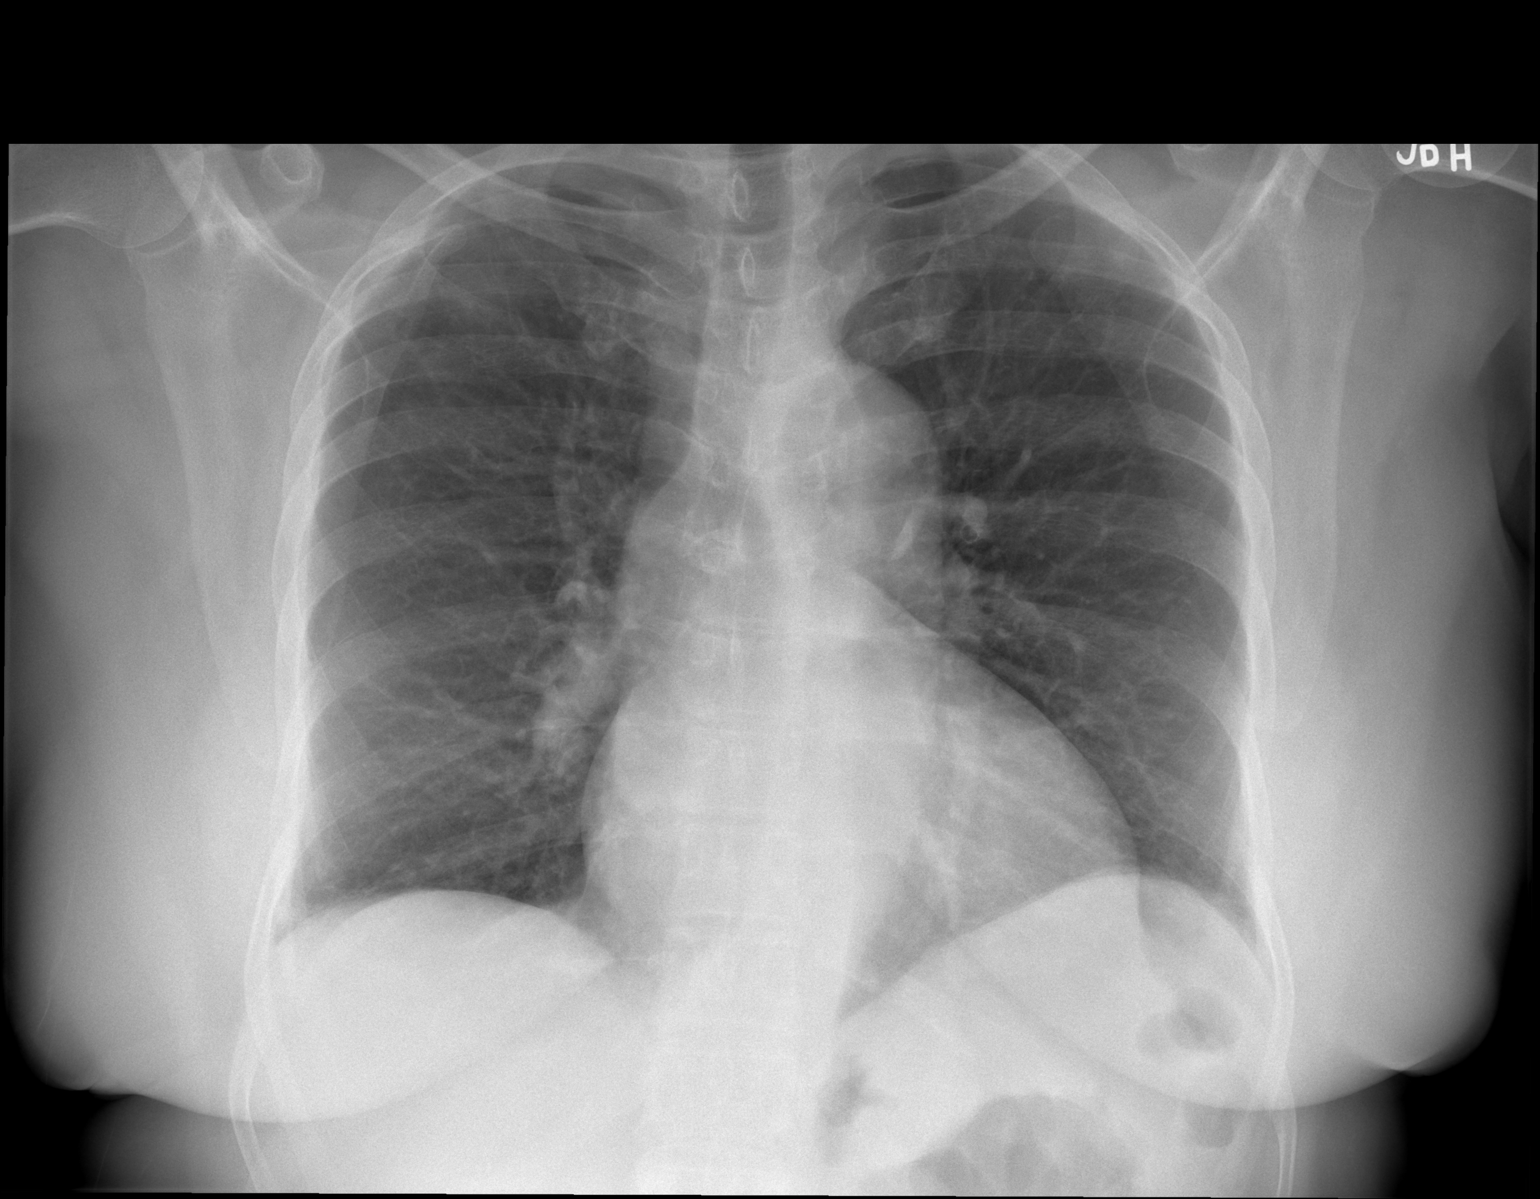

[w chest lat]
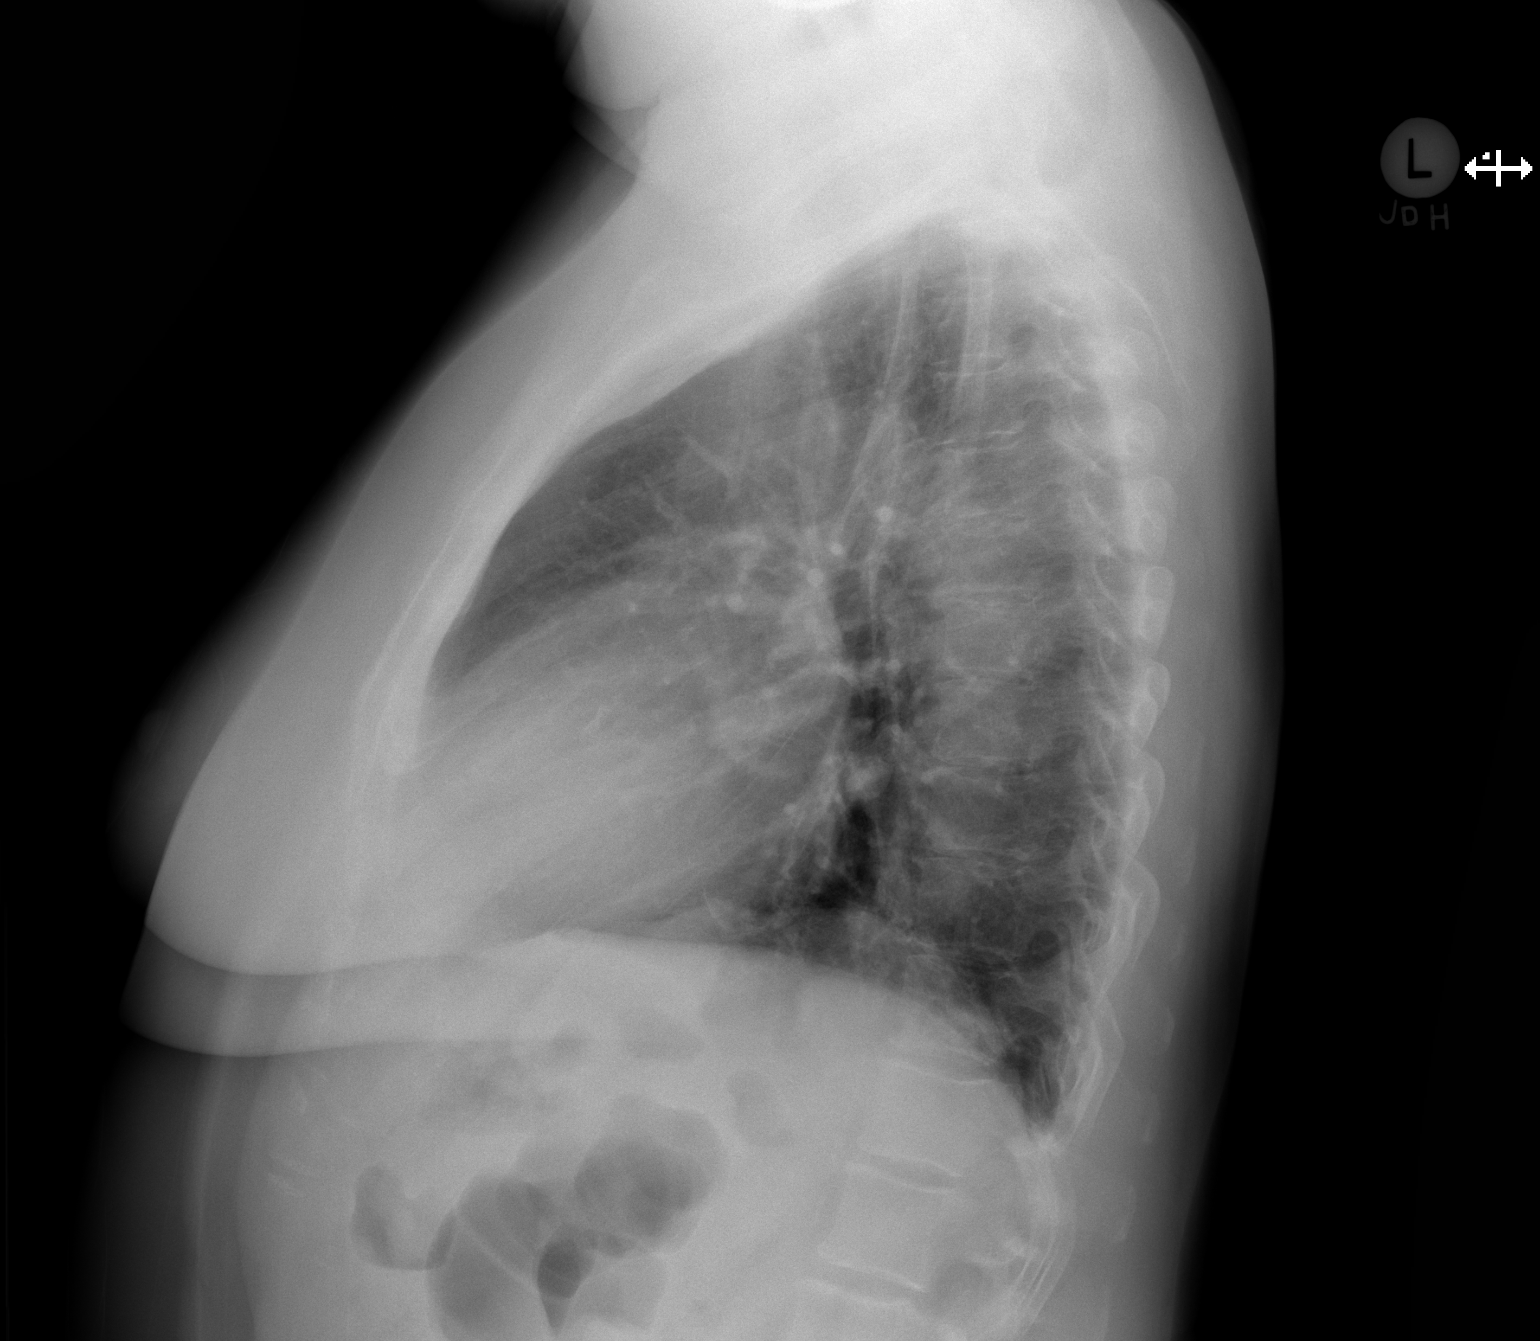

[2 of 2 positions shown; findings below may reference images not displayed]

FINDINGS: Stable mild cardiac enlargement with uncoiling of the aorta. The
vascular pattern is normal. There is no infiltrate or effusion.
IMPRESSION: No active cardiopulmonary disease.

## 2017-11-06 DIAGNOSIS — M25561 Pain in right knee: Secondary | ICD-10-CM | POA: Diagnosis not present

## 2017-11-13 DIAGNOSIS — Z01411 Encounter for gynecological examination (general) (routine) with abnormal findings: Secondary | ICD-10-CM | POA: Diagnosis not present

## 2017-11-13 DIAGNOSIS — Z124 Encounter for screening for malignant neoplasm of cervix: Secondary | ICD-10-CM | POA: Diagnosis not present

## 2017-11-13 DIAGNOSIS — Z1231 Encounter for screening mammogram for malignant neoplasm of breast: Secondary | ICD-10-CM | POA: Diagnosis not present

## 2017-11-13 LAB — HM MAMMOGRAPHY

## 2017-11-22 ENCOUNTER — Ambulatory Visit: Payer: Medicare Other | Admitting: Family Medicine

## 2017-11-22 DIAGNOSIS — Z0289 Encounter for other administrative examinations: Secondary | ICD-10-CM

## 2017-11-26 ENCOUNTER — Encounter: Payer: Self-pay | Admitting: Family Medicine

## 2017-12-05 DIAGNOSIS — G4733 Obstructive sleep apnea (adult) (pediatric): Secondary | ICD-10-CM | POA: Diagnosis not present

## 2018-01-02 DIAGNOSIS — J300 Vasomotor rhinitis: Secondary | ICD-10-CM | POA: Diagnosis not present

## 2018-01-02 DIAGNOSIS — T781XXD Other adverse food reactions, not elsewhere classified, subsequent encounter: Secondary | ICD-10-CM | POA: Diagnosis not present

## 2018-01-02 DIAGNOSIS — J4530 Mild persistent asthma, uncomplicated: Secondary | ICD-10-CM | POA: Diagnosis not present

## 2018-02-18 ENCOUNTER — Other Ambulatory Visit: Payer: Self-pay

## 2018-02-18 NOTE — Patient Outreach (Signed)
[facility]) Care Management  02/18/2018  Wendy Christensen Jun 09, 1946 [id number]   Medication Adherence call to Wendy Christensen spoke with patient she said she still have a few pills left, but if we can help her order Olmesartan 20 mg from [employer]. Wendy Christensen will pick up when ready from the pharmacy, [facility] will fill and have it ready for patient to pick up Wendy Christensen is showing under [facility] Ins.for Medication Adherence call.   [place] Management Direct Dial [phone]  Fax [phone] [email or web address]

## 2018-02-22 ENCOUNTER — Encounter: Payer: Self-pay | Admitting: Family Medicine

## 2018-02-22 ENCOUNTER — Ambulatory Visit (INDEPENDENT_AMBULATORY_CARE_PROVIDER_SITE_OTHER): Payer: Medicare Other | Admitting: Family Medicine

## 2018-02-22 VITALS — BP 150/67 | HR 52 | Temp 98.9°F | Resp 16 | Ht 65.0 in | Wt 178.2 lb

## 2018-02-22 DIAGNOSIS — I10 Essential (primary) hypertension: Secondary | ICD-10-CM

## 2018-02-22 DIAGNOSIS — E785 Hyperlipidemia, unspecified: Secondary | ICD-10-CM

## 2018-02-22 LAB — COMPREHENSIVE METABOLIC PANEL
ALT: 11 U/L (ref 0–35)
AST: 16 U/L (ref 0–37)
Albumin: 4.1 g/dL (ref 3.5–5.2)
Alkaline Phosphatase: 68 U/L (ref 39–117)
BILIRUBIN TOTAL: 0.4 mg/dL (ref 0.2–1.2)
BUN: 17 mg/dL (ref 6–23)
CO2: 31 meq/L (ref 19–32)
CREATININE: 1.22 mg/dL — AB (ref 0.40–1.20)
Calcium: 9.6 mg/dL (ref 8.4–10.5)
Chloride: 101 mEq/L (ref 96–112)
GFR: 55.99 mL/min — ABNORMAL LOW (ref >60.00)
Glucose, Bld: 102 mg/dL — ABNORMAL HIGH (ref 70–99)
Potassium: 4.4 mEq/L (ref 3.5–5.1)
Sodium: 139 mEq/L (ref 135–145)
Total Protein: 7.4 g/dL (ref 6.0–8.3)

## 2018-02-22 LAB — LIPID PANEL
CHOL/HDL RATIO: 8
Cholesterol: 381 mg/dL — ABNORMAL HIGH (ref 0–200)
HDL: 47.1 mg/dL (ref >39.00)
LDL Cholesterol: 309 mg/dL — ABNORMAL HIGH (ref 0–99)
NONHDL: 334.27
TRIGLYCERIDES: 125 mg/dL (ref 0.0–149.0)
VLDL: 25 mg/dL (ref 0.0–40.0)

## 2018-02-22 MED ORDER — OLMESARTAN MEDOXOMIL 20 MG PO TABS: 20.0000 mg | ORAL_TABLET | Freq: Every day | ORAL | 1 refills | Status: DC

## 2018-02-22 NOTE — Assessment & Plan Note (Signed)
Encouraged heart healthy diet, increase exercise, avoid trans fats, consider a krill oil cap daily con't statin 

## 2018-02-22 NOTE — Patient Instructions (Signed)

## 2018-02-22 NOTE — Assessment & Plan Note (Signed)
Poorly controlled will alter medications, encouraged DASH diet, minimize caffeine and obtain adequate sleep. Report concerning symptoms and follow up as directed and as needed 

## 2018-02-22 NOTE — Progress Notes (Signed)
Patient ID: Wendy Christensen, female   DOB: 17-Dec-1945, 72 y.o.   MRN: [id number]     Subjective:  I acted as a Education administrator for Dr. Carollee Herter.  Guerry Bruin, [place]   Patient ID: Wendy Christensen, female    DOB: Feb 04, 1946, 72 y.o.   MRN: [id number]  Chief Complaint  Patient presents with  . Hypertension  . Hyperlipidemia    HPI  Patient is in today for follow up blood pressure and cholesterol.  No complaints.   Patient Care Team: Carollee Herter, Alferd Apa, DO as PCP - General Inda Castle, MD (Inactive) as Consulting Physician (Gastroenterology) Rigoberto Noel, MD as Consulting Physician (Pulmonary Disease) Renato Shin, MD as Consulting Physician (Endocrinology) Rexene Agent, MD as Attending Physician (Nephrology) Nickie Retort, MD as Consulting Physician (Urology) Mosetta Anis, MD as Referring Physician (Allergy) Alanda Slim Neena Rhymes, MD as Consulting Physician (Ophthalmology) Ena Dawley, MD as Consulting Physician (Obstetrics and Gynecology)   Past Medical History:  Diagnosis Date  . Allergic rhinitis   . Anxiety   . Asthma, intrinsic   . Depression   . Frequency of urination   . GERD (gastroesophageal reflux disease)   . Hyperlipidemia   . Hypertension   . Kidney stone    Left kidney-calcium   . OSA on CPAP    PT IN PROCESS OF GETTING MACHINE SET UP  . Thyroid goiter   . Urgency of urination   . Wears glasses     Past Surgical History:  Procedure Laterality Date  . CYSTOSCOPY WITH RETROGRADE PYELOGRAM, URETEROSCOPY AND STENT PLACEMENT Left 06/21/2015   Procedure: CYSTOSCOPY WITH RETROGRADE PYELOGRAM, URETEROSCOPY AND STENT PLACEMENT;  Surgeon: Nickie Retort, MD;  Location: [facility];  Service: Urology;  Laterality: Left;  . DILATION AND CURETTAGE OF UTERUS  1990   W/ HYSTEROSCOPY  . HOLMIUM LASER APPLICATION Left 62/03/349   Procedure: HOLMIUM LASER APPLICATION;  Surgeon: Nickie Retort, MD;  Location: [facility];  Service: Urology;  Laterality: Left;  . STONE EXTRACTION WITH BASKET Left 06/21/2015   Procedure: STONE EXTRACTION WITH BASKET;  Surgeon: Nickie Retort, MD;  Location: [facility];  Service: Urology;  Laterality: Left;  . TUBAL LIGATION  1980's    Family History  Problem Relation Age of Onset  . Hypertension Mother   . Heart disease Mother   . COPD Father   . Asthma Father   . Hyperlipidemia Unknown   . Hypertension Unknown   . Kidney cancer Maternal Aunt   . Heart attack Maternal Aunt   . Stroke Brother   . Hypertension Brother   . Hypertension Brother     Social History   Socioeconomic History  . Marital status: Married    Spouse name: Not on file  . Number of children: Not on file  . Years of education: Not on file  . Highest education level: Not on file  Occupational History  . Occupation: Retired    Fish farm manager: DISABLE  Social Needs  . Financial resource strain: Not on file  . Food insecurity:    Worry: Not on file    Inability: Not on file  . Transportation needs:    Medical: Not on file    Non-medical: Not on file  Tobacco Use  . Smoking status: Never Smoker  . Smokeless tobacco: Never Used  Substance and Sexual Activity  . Alcohol use: Yes    Alcohol/week: 0.0 oz    Comment: 1 beer  every once in awhile   . Drug use: No  . Sexual activity: Yes    Partners: Male    Comment: Husband   Lifestyle  . Physical activity:    Days per week: Not on file    Minutes per session: Not on file  . Stress: Not on file  Relationships  . Social connections:    Talks on phone: Not on file    Gets together: Not on file    Attends religious service: Not on file    Active member of club or organization: Not on file    Attends meetings of clubs or organizations: Not on file    Relationship status: Not on file  . Intimate partner violence:    Fear of current or ex partner: Not on file    Emotionally abused: Not on file    Physically  abused: Not on file    Forced sexual activity: Not on file  Other Topics Concern  . 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 albuterol (PROAIR HFA) 108 (90 Base) MCG/ACT inhaler INHALE 2 PUFFS INTO THE LUNGS EVERY 4 HOURS AS NEEDED FOR WHEEZING 8.5 g 3  . atorvastatin (LIPITOR) 40 MG tablet TAKE 1 TABLET BY MOUTH DAILY 90 tablet 0  . citalopram (CELEXA) 40 MG tablet TAKE 1 TABLET BY MOUTH EVERY MORNING 90 tablet 0  . fenofibrate 160 MG tablet Take 1 tablet (160 mg total) by mouth daily. 90 tablet 1  . FLOVENT HFA 110 MCG/ACT inhaler Inhale 2 puffs into the lungs daily as needed. 1 Inhaler 3  . fluticasone (FLONASE) 50 MCG/ACT nasal spray Place 2 sprays into both nostrils as needed.  3  . gentamicin (GARAMYCIN) 0.3 % ophthalmic solution Reported on 09/24/2015  0  . metoprolol (LOPRESSOR) 100 MG tablet Take 1 tablet (100 mg total) by mouth 2 (two) times daily. 180 tablet 1  . montelukast (SINGULAIR) 10 MG tablet Take 1 tablet (10 mg total) by mouth at bedtime. 90 tablet 3  . Multiple Vitamins-Minerals (MULTIVITAMIN WITH MINERALS) tablet Take 1 tablet by mouth daily.    Marland Kitchen omeprazole (PRILOSEC) 20 MG capsule TAKE 1 CAPSULE BY MOUTH ONCE DAILY 90 capsule 0  . Propylene Glycol-Glycerin (MOISTURE EYES OP) Apply to eye.    . vitamin B-12 (CYANOCOBALAMIN) 1000 MCG tablet Take 1,000 mcg by mouth daily.    Marland Kitchen olmesartan (BENICAR) 20 MG tablet Take 1 tablet (20 mg total) by mouth daily. 30 tablet 6  . hydrochlorothiazide (HYDRODIURIL) 25 MG tablet Take 1 tablet (25 mg total) by mouth daily. 1 po qd prn 90 tablet 1  . acetaminophen (TYLENOL) 325 MG tablet Take 650 mg by mouth every 6 (six) hours as needed.    Marland Kitchen atorvastatin (LIPITOR) 40 MG tablet TAKE 1 TABLET BY MOUTH EVERY DAY 30 tablet 0  . metoprolol tartrate (LOPRESSOR) 100 MG tablet TAKE 1 TABLET BY MOUTH TWICE DAILY 180 tablet 1    . Zoster Vac Recomb Adjuvanted (SHINGRIX) 50 MCG SUSR Inject 50 mcg into the muscle daily. (Patient not taking: Reported on 05/24/2017) 1 each 1   No facility-administered medications prior to visit.     No Known Allergies  Review of Systems  Constitutional: Negative for fever and malaise/fatigue.  HENT: Negative for congestion.   Eyes: Negative for blurred vision.  Respiratory: Negative for cough and shortness of breath.  Cardiovascular: Negative for chest pain, palpitations and leg swelling.  Gastrointestinal: Negative for vomiting.  Musculoskeletal: Negative for back pain.  Skin: Negative for rash.  Neurological: Negative for loss of consciousness and headaches.       Objective:    Physical Exam  Constitutional: She is oriented to person, place, and time. She appears well-developed and well-nourished.  HENT:  Head: Normocephalic and atraumatic.  Eyes: Conjunctivae and EOM are normal.  Neck: Normal range of motion. Neck supple. No JVD present. Carotid bruit is not present. No thyromegaly present.  Cardiovascular: Normal rate, regular rhythm and normal heart sounds.  No murmur heard. Pulmonary/Chest: Effort normal and breath sounds normal. No respiratory distress. She has no wheezes. She has no rales. She exhibits no tenderness.  Musculoskeletal: She exhibits no edema.  Neurological: She is alert and oriented to person, place, and time.  Psychiatric: She has a normal mood and affect.  Nursing note and vitals reviewed.   BP (!) 150/67   Pulse (!) 52   Temp 98.9 F (37.2 C) (Oral)   Resp 16   Ht 5\' 5"  (1.651 m)   Wt 178 lb 3.2 oz (80.8 kg)   SpO2 97%   BMI 29.65 kg/m  Wt Readings from Last 3 Encounters:  02/22/18 178 lb 3.2 oz (80.8 kg)  05/24/17 188 lb (85.3 kg)  11/17/16 184 lb 12.8 oz (83.8 kg)   BP Readings from Last 3 Encounters:  02/22/18 (!) 150/67  05/24/17 136/74  05/01/17 108/68     Immunization History  Administered Date(s) Administered  .  Influenza Split 05/29/2012, 06/17/2013  . Influenza Whole 07/31/2007  . Influenza, High Dose Seasonal PF 07/02/2015, 05/24/2017  . Influenza-Unspecified 08/07/2014, 06/19/2016  . Pneumococcal Conjugate-13 07/07/2015  . Pneumococcal Polysaccharide-23 12/16/2012  . Td 04/18/2005  . Zoster 04/24/2008    Health Maintenance  Topic Date Due  . TETANUS/TDAP  04/19/2015  . MAMMOGRAM  07/28/2016  . COLONOSCOPY  11/13/2016  . INFLUENZA VACCINE  04/18/2018  . DEXA SCAN  Completed  . Hepatitis C Screening  Completed  . PNA vac Low Risk Adult  Completed    Lab Results  Component Value Date   WBC 6.4 11/17/2016   HGB 12.0 11/17/2016   HCT 37.5 11/17/2016   PLT 294 11/17/2016   GLUCOSE 91 05/24/2017   CHOL 198 05/24/2017   TRIG 127.0 05/24/2017   HDL 46.20 05/24/2017   LDLDIRECT 215.7 10/18/2011   LDLCALC 126 (H) 05/24/2017   ALT 13 05/24/2017   AST 20 05/24/2017   NA 138 05/24/2017   K 4.3 05/24/2017   CL 100 05/24/2017   CREATININE 1.14 05/24/2017   BUN 15 05/24/2017   CO2 31 05/24/2017   TSH 1.24 11/17/2016   HGBA1C 6.4 09/14/2014   MICROALBUR <0.7 03/23/2015    Lab Results  Component Value Date   TSH 1.24 11/17/2016   Lab Results  Component Value Date   WBC 6.4 11/17/2016   HGB 12.0 11/17/2016   HCT 37.5 11/17/2016   MCV 83.3 11/17/2016   PLT 294 11/17/2016   Lab Results  Component Value Date   NA 138 05/24/2017   K 4.3 05/24/2017   CO2 31 05/24/2017   GLUCOSE 91 05/24/2017   BUN 15 05/24/2017   CREATININE 1.14 05/24/2017   BILITOT 0.4 05/24/2017   ALKPHOS 72 05/24/2017   AST 20 05/24/2017   ALT 13 05/24/2017   PROT 7.7 05/24/2017   ALBUMIN 4.1 05/24/2017   CALCIUM 9.5 05/24/2017  ANIONGAP 9 02/24/2015   GFR 60.68 05/24/2017   Lab Results  Component Value Date   CHOL 198 05/24/2017   Lab Results  Component Value Date   HDL 46.20 05/24/2017   Lab Results  Component Value Date   LDLCALC 126 (H) 05/24/2017   Lab Results  Component Value Date     TRIG 127.0 05/24/2017   Lab Results  Component Value Date   CHOLHDL 4 05/24/2017   Lab Results  Component Value Date   HGBA1C 6.4 09/14/2014         Assessment & Plan:   Problem List Items Addressed This Visit      Unprioritized   Essential hypertension - Primary    Poorly controlled will alter medications, encouraged DASH diet, minimize caffeine and obtain adequate sleep. Report concerning symptoms and follow up as directed and as needed      Relevant Medications   olmesartan (BENICAR) 20 MG tablet   Other Relevant Orders   Comprehensive metabolic panel   Lipid panel   Hyperlipidemia LDL goal <100    Encouraged heart healthy diet, increase exercise, avoid trans fats, consider a krill oil cap daily con't statin       Relevant Medications   olmesartan (BENICAR) 20 MG tablet    Other Visit Diagnoses    Hyperlipidemia, unspecified hyperlipidemia type       Relevant Medications   olmesartan (BENICAR) 20 MG tablet   Other Relevant Orders   Comprehensive metabolic panel   Lipid panel      I have discontinued [name]'s acetaminophen and Zoster Vaccine Adjuvanted. I am also having her maintain her multivitamin with minerals, vitamin B-12, fluticasone, gentamicin, Propylene Glycol-Glycerin (MOISTURE EYES OP), montelukast, fenofibrate, hydrochlorothiazide, metoprolol tartrate, omeprazole, FLOVENT HFA, albuterol, atorvastatin, citalopram, and olmesartan.  Meds ordered this encounter  Medications  . olmesartan (BENICAR) 20 MG tablet    Sig: Take 1 tablet (20 mg total) by mouth daily.    Dispense:  90 tablet    Refill:  1    CMA served as Education administrator during this visit. History, Physical and Plan performed by medical provider. Documentation and orders reviewed and attested to.  [name], DO

## 2018-02-28 DIAGNOSIS — H43813 Vitreous degeneration, bilateral: Secondary | ICD-10-CM | POA: Diagnosis not present

## 2018-02-28 DIAGNOSIS — H52223 Regular astigmatism, bilateral: Secondary | ICD-10-CM | POA: Diagnosis not present

## 2018-02-28 DIAGNOSIS — H35373 Puckering of macula, bilateral: Secondary | ICD-10-CM | POA: Diagnosis not present

## 2018-02-28 DIAGNOSIS — H33311 Horseshoe tear of retina without detachment, right eye: Secondary | ICD-10-CM | POA: Diagnosis not present

## 2018-02-28 DIAGNOSIS — H35412 Lattice degeneration of retina, left eye: Secondary | ICD-10-CM | POA: Diagnosis not present

## 2018-03-05 ENCOUNTER — Ambulatory Visit: Payer: Self-pay | Admitting: Sports Medicine

## 2018-03-05 DIAGNOSIS — G4733 Obstructive sleep apnea (adult) (pediatric): Secondary | ICD-10-CM | POA: Diagnosis not present

## 2018-03-07 DIAGNOSIS — H35412 Lattice degeneration of retina, left eye: Secondary | ICD-10-CM | POA: Diagnosis not present

## 2018-03-07 DIAGNOSIS — H33311 Horseshoe tear of retina without detachment, right eye: Secondary | ICD-10-CM | POA: Diagnosis not present

## 2018-03-08 ENCOUNTER — Ambulatory Visit (INDEPENDENT_AMBULATORY_CARE_PROVIDER_SITE_OTHER): Payer: Medicare Other | Admitting: Family Medicine

## 2018-03-08 ENCOUNTER — Encounter: Payer: Self-pay | Admitting: Family Medicine

## 2018-03-08 VITALS — BP 142/53 | HR 53 | Temp 98.6°F | Resp 16 | Ht 65.0 in | Wt 180.8 lb

## 2018-03-08 DIAGNOSIS — E785 Hyperlipidemia, unspecified: Secondary | ICD-10-CM

## 2018-03-08 DIAGNOSIS — I10 Essential (primary) hypertension: Secondary | ICD-10-CM | POA: Diagnosis not present

## 2018-03-08 MED ORDER — FENOFIBRATE 160 MG PO TABS: 160.0000 mg | ORAL_TABLET | Freq: Every day | ORAL | 1 refills | Status: DC

## 2018-03-08 MED ORDER — OLMESARTAN MEDOXOMIL 40 MG PO TABS: 40.0000 mg | ORAL_TABLET | Freq: Every day | ORAL | 2 refills | Status: DC

## 2018-03-08 NOTE — Assessment & Plan Note (Signed)
Inc benicar to 40 mg  con't metoprolol and hctz bp check 3 months

## 2018-03-08 NOTE — Progress Notes (Signed)
Patient ID: Wendy Christensen, female   DOB: [birth year], 72 y.o.   MRN: [id number]    Subjective:  I acted as a Education administrator for Dr. Carollee Herter.  Guerry Bruin, [place]   Patient ID: Wendy Christensen, female    DOB: [birth year], 72 y.o.   MRN: [id number]  Chief Complaint  Patient presents with  . Hypertension    HPI  Patient is in today for follow up blood pressure.  No complaints   Patient Care Team: Carollee Herter, Alferd Apa, DO as PCP - General Inda Castle, MD (Inactive) as Consulting Physician (Gastroenterology) Rigoberto Noel, MD as Consulting Physician (Pulmonary Disease) Renato Shin, MD as Consulting Physician (Endocrinology) Rexene Agent, MD as Attending Physician (Nephrology) Nickie Retort, MD as Consulting Physician (Urology) Mosetta Anis, MD as Referring Physician (Allergy) Alanda Slim Neena Rhymes, MD as Consulting Physician (Ophthalmology) Ena Dawley, MD as Consulting Physician (Obstetrics and Gynecology)   Past Medical History:  Diagnosis Date  . Allergic rhinitis   . Anxiety   . Asthma, intrinsic   . Depression   . Frequency of urination   . GERD (gastroesophageal reflux disease)   . Hyperlipidemia   . Hypertension   . Kidney stone    Left kidney-calcium   . OSA on CPAP    PT IN PROCESS OF GETTING MACHINE SET UP  . Thyroid goiter   . Urgency of urination   . Wears glasses     Past Surgical History:  Procedure Laterality Date  . CATARACT EXTRACTION, BILATERAL    . CYSTOSCOPY WITH RETROGRADE PYELOGRAM, URETEROSCOPY AND STENT PLACEMENT Left 06/21/2015   Procedure: CYSTOSCOPY WITH RETROGRADE PYELOGRAM, URETEROSCOPY AND STENT PLACEMENT;  Surgeon: Nickie Retort, MD;  Location: [facility];  Service: Urology;  Laterality: Left;  . DILATION AND CURETTAGE OF UTERUS  1990   W/ HYSTEROSCOPY  . EYE SURGERY     retina both eyes 6/19  . HOLMIUM LASER APPLICATION Left 06/19/7252   Procedure: HOLMIUM LASER APPLICATION;  Surgeon: Nickie Retort, MD;  Location: [facility];  Service: Urology;  Laterality: Left;  . STONE EXTRACTION WITH BASKET Left 06/21/2015   Procedure: STONE EXTRACTION WITH BASKET;  Surgeon: Nickie Retort, MD;  Location: [facility];  Service: Urology;  Laterality: Left;  . TUBAL LIGATION  1980's    Family History  Problem Relation Age of Onset  . Hypertension Mother   . Heart disease Mother   . COPD Father   . Asthma Father   . Hyperlipidemia Unknown   . Hypertension Unknown   . Kidney cancer Maternal Aunt   . Heart attack Maternal Aunt   . Stroke Brother   . Hypertension Brother   . Hypertension Brother     Social History   Socioeconomic History  . Marital status: Married    Spouse name: Not on file  . Number of children: Not on file  . Years of education: Not on file  . Highest education level: Not on file  Occupational History  . Occupation: Retired    Fish farm manager: DISABLE  Social Needs  . Financial resource strain: Not on file  . Food insecurity:    Worry: Not on file    Inability: Not on file  . Transportation needs:    Medical: Not on file    Non-medical: Not on file  Tobacco Use  . Smoking status: Never Smoker  . Smokeless tobacco: Never Used  Substance and Sexual Activity  . Alcohol use:  Yes    Alcohol/week: 0.0 oz    Comment: 1 beer every once in awhile   . Drug use: No  . Sexual activity: Yes    Partners: Male    Comment: Husband   Lifestyle  . Physical activity:    Days per week: Not on file    Minutes per session: Not on file  . Stress: Not on file  Relationships  . Social connections:    Talks on phone: Not on file    Gets together: Not on file    Attends religious service: Not on file    Active member of club or organization: Not on file    Attends meetings of clubs or organizations: Not on file    Relationship status: Not on file  . Intimate partner violence:    Fear of current or ex partner: Not on file     Emotionally abused: Not on file    Physically abused: Not on file    Forced sexual activity: Not on file  Other Topics Concern  . 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 albuterol (PROAIR HFA) 108 (90 Base) MCG/ACT inhaler INHALE 2 PUFFS INTO THE LUNGS EVERY 4 HOURS AS NEEDED FOR WHEEZING 8.5 g 3  . atorvastatin (LIPITOR) 40 MG tablet TAKE 1 TABLET BY MOUTH DAILY 90 tablet 0  . citalopram (CELEXA) 40 MG tablet TAKE 1 TABLET BY MOUTH EVERY MORNING 90 tablet 0  . FLOVENT HFA 110 MCG/ACT inhaler Inhale 2 puffs into the lungs daily as needed. 1 Inhaler 3  . fluticasone (FLONASE) 50 MCG/ACT nasal spray Place 2 sprays into both nostrils as needed.  3  . gentamicin (GARAMYCIN) 0.3 % ophthalmic solution Reported on 09/24/2015  0  . metoprolol (LOPRESSOR) 100 MG tablet Take 1 tablet (100 mg total) by mouth 2 (two) times daily. 180 tablet 1  . montelukast (SINGULAIR) 10 MG tablet Take 1 tablet (10 mg total) by mouth at bedtime. 90 tablet 3  . Multiple Vitamins-Minerals (MULTIVITAMIN WITH MINERALS) tablet Take 1 tablet by mouth daily.    Marland Kitchen omeprazole (PRILOSEC) 20 MG capsule TAKE 1 CAPSULE BY MOUTH ONCE DAILY 90 capsule 0  . Propylene Glycol-Glycerin (MOISTURE EYES OP) Apply to eye.    . vitamin B-12 (CYANOCOBALAMIN) 1000 MCG tablet Take 1,000 mcg by mouth daily.    . fenofibrate 160 MG tablet Take 1 tablet (160 mg total) by mouth daily. 90 tablet 1  . olmesartan (BENICAR) 20 MG tablet Take 1 tablet (20 mg total) by mouth daily. 90 tablet 1  . hydrochlorothiazide (HYDRODIURIL) 25 MG tablet Take 1 tablet (25 mg total) by mouth daily. 1 po qd prn 90 tablet 1   No facility-administered medications prior to visit.     No Known Allergies  ROS     Objective:    Physical Exam  BP (!) 142/53   Pulse (!) 53   Temp 98.6 F (37 C) (Oral)   Resp 16   Ht 5\' 5"  (1.651 m)   Wt 180  lb 12.8 oz (82 kg)   SpO2 100%   BMI 30.09 kg/m  Wt Readings from Last 3 Encounters:  03/08/18 180 lb 12.8 oz (82 kg)  02/22/18 178 lb 3.2 oz (80.8 kg)  05/24/17 188 lb (85.3 kg)   BP Readings from Last 3 Encounters:  03/08/18 (!) 142/53  02/22/18 Marland Kitchen)  150/67  05/24/17 136/74     Immunization History  Administered Date(s) Administered  . Influenza Split 05/29/2012, 06/17/2013  . Influenza Whole 07/31/2007  . Influenza, High Dose Seasonal PF 07/02/2015, 05/24/2017  . Influenza-Unspecified 08/07/2014, 06/19/2016  . Pneumococcal Conjugate-13 07/07/2015  . Pneumococcal Polysaccharide-23 12/16/2012  . Td 04/18/2005  . Zoster 04/24/2008    Health Maintenance  Topic Date Due  . TETANUS/TDAP  04/19/2015  . MAMMOGRAM  07/28/2016  . COLONOSCOPY  11/13/2016  . INFLUENZA VACCINE  04/18/2018  . DEXA SCAN  Completed  . Hepatitis C Screening  Completed  . PNA vac Low Risk Adult  Completed    Lab Results  Component Value Date   WBC 6.4 11/17/2016   HGB 12.0 11/17/2016   HCT 37.5 11/17/2016   PLT 294 11/17/2016   GLUCOSE 102 (H) 02/22/2018   CHOL 381 (H) 02/22/2018   TRIG 125.0 02/22/2018   HDL 47.10 02/22/2018   LDLDIRECT 215.7 10/18/2011   LDLCALC 309 (H) 02/22/2018   ALT 11 02/22/2018   AST 16 02/22/2018   NA 139 02/22/2018   K 4.4 02/22/2018   CL 101 02/22/2018   CREATININE 1.22 (H) 02/22/2018   BUN 17 02/22/2018   CO2 31 02/22/2018   TSH 1.24 11/17/2016   HGBA1C 6.4 09/14/2014   MICROALBUR <0.7 03/23/2015    Lab Results  Component Value Date   TSH 1.24 11/17/2016   Lab Results  Component Value Date   WBC 6.4 11/17/2016   HGB 12.0 11/17/2016   HCT 37.5 11/17/2016   MCV 83.3 11/17/2016   PLT 294 11/17/2016   Lab Results  Component Value Date   NA 139 02/22/2018   K 4.4 02/22/2018   CO2 31 02/22/2018   GLUCOSE 102 (H) 02/22/2018   BUN 17 02/22/2018   CREATININE 1.22 (H) 02/22/2018   BILITOT 0.4 02/22/2018   ALKPHOS 68 02/22/2018   AST 16 02/22/2018     ALT 11 02/22/2018   PROT 7.4 02/22/2018   ALBUMIN 4.1 02/22/2018   CALCIUM 9.6 02/22/2018   ANIONGAP 9 02/24/2015   GFR 55.99 (L) 02/22/2018   Lab Results  Component Value Date   CHOL 381 (H) 02/22/2018   Lab Results  Component Value Date   HDL 47.10 02/22/2018   Lab Results  Component Value Date   LDLCALC 309 (H) 02/22/2018   Lab Results  Component Value Date   TRIG 125.0 02/22/2018   Lab Results  Component Value Date   CHOLHDL 8 02/22/2018   Lab Results  Component Value Date   HGBA1C 6.4 09/14/2014         Assessment & Plan:   Problem List Items Addressed This Visit      Unprioritized   Essential hypertension - Primary   Relevant Medications   olmesartan (BENICAR) 40 MG tablet   fenofibrate 160 MG tablet   Hyperlipidemia LDL goal <100   Relevant Medications   olmesartan (BENICAR) 40 MG tablet   fenofibrate 160 MG tablet      I have discontinued [name]'s olmesartan. I am also having her start on olmesartan. Additionally, I am having her maintain her multivitamin with minerals, vitamin B-12, fluticasone, gentamicin, Propylene Glycol-Glycerin (MOISTURE EYES OP), montelukast, hydrochlorothiazide, metoprolol tartrate, omeprazole, FLOVENT HFA, albuterol, atorvastatin, citalopram, and fenofibrate.  Meds ordered this encounter  Medications  . olmesartan (BENICAR) 40 MG tablet    Sig: Take 1 tablet (40 mg total) by mouth daily.    Dispense:  30 tablet  Refill:  2  . fenofibrate 160 MG tablet    Sig: Take 1 tablet (160 mg total) by mouth daily.    Dispense:  90 tablet    Refill:  1    CMA served as Education administrator during this visit. History, Physical and Plan performed by medical provider. Documentation and orders reviewed and attested to.  [name], DO

## 2018-03-08 NOTE — Patient Instructions (Signed)
DASH Eating Plan DASH stands for "Dietary Approaches to Stop Hypertension." The DASH eating plan is a healthy eating plan that has been shown to reduce high blood pressure (hypertension). It may also reduce your risk for type 2 diabetes, heart disease, and stroke. The DASH eating plan may also help with weight loss. What are tips for following this plan? General guidelines  Avoid eating more than 2,300 mg (milligrams) of salt (sodium) a day. If you have hypertension, you may need to reduce your sodium intake to 1,500 mg a day.  Limit alcohol intake to no more than 1 drink a day for nonpregnant women and 2 drinks a day for men. One drink equals 12 oz of beer, 5 oz of wine, or 1 oz of hard liquor.  Work with your health care provider to maintain a healthy body weight or to lose weight. Ask what an ideal weight is for you.  Get at least 30 minutes of exercise that causes your heart to beat faster (aerobic exercise) most days of the week. Activities may include walking, swimming, or biking.  Work with your health care provider or diet and nutrition specialist (dietitian) to adjust your eating plan to your individual calorie needs. Reading food labels  Check food labels for the amount of sodium per serving. Choose foods with less than 5 percent of the Daily Value of sodium. Generally, foods with less than 300 mg of sodium per serving fit into this eating plan.  To find whole grains, look for the word "whole" as the first word in the ingredient list. Shopping  Buy products labeled as "low-sodium" or "no salt added."  Buy fresh foods. Avoid canned foods and premade or frozen meals. Cooking  Avoid adding salt when cooking. Use salt-free seasonings or herbs instead of table salt or sea salt. Check with your health care provider or pharmacist before using salt substitutes.  Do not fry foods. Cook foods using healthy methods such as baking, boiling, grilling, and broiling instead.  Cook with  heart-healthy oils, such as olive, canola, soybean, or sunflower oil. Meal planning   Eat a balanced diet that includes: ? 5 or more servings of fruits and vegetables each day. At each meal, try to fill half of your plate with fruits and vegetables. ? Up to 6-8 servings of whole grains each day. ? Less than 6 oz of lean meat, poultry, or fish each day. A 3-oz serving of meat is about the same size as a deck of cards. One egg equals 1 oz. ? 2 servings of low-fat dairy each day. ? A serving of nuts, seeds, or beans 5 times each week. ? Heart-healthy fats. Healthy fats called Omega-3 fatty acids are found in foods such as flaxseeds and coldwater fish, like sardines, salmon, and mackerel.  Limit how much you eat of the following: ? Canned or prepackaged foods. ? Food that is high in trans fat, such as fried foods. ? Food that is high in saturated fat, such as fatty meat. ? Sweets, desserts, sugary drinks, and other foods with added sugar. ? Full-fat dairy products.  Do not salt foods before eating.  Try to eat at least 2 vegetarian meals each week.  Eat more home-cooked food and less restaurant, buffet, and fast food.  When eating at a restaurant, ask that your food be prepared with less salt or no salt, if possible. What foods are recommended? The items listed may not be a complete list. Talk with your dietitian about what   dietary choices are best for you. Grains Whole-grain or whole-wheat bread. Whole-grain or whole-wheat pasta. Brown rice. Oatmeal. Quinoa. Bulgur. Whole-grain and low-sodium cereals. Pita bread. Low-fat, low-sodium crackers. Whole-wheat flour tortillas. Vegetables Fresh or frozen vegetables (raw, steamed, roasted, or grilled). Low-sodium or reduced-sodium tomato and vegetable juice. Low-sodium or reduced-sodium tomato sauce and tomato paste. Low-sodium or reduced-sodium canned vegetables. Fruits All fresh, dried, or frozen fruit. Canned fruit in natural juice (without  added sugar). Meat and other protein foods Skinless chicken or turkey. Ground chicken or turkey. Pork with fat trimmed off. Fish and seafood. Egg whites. Dried beans, peas, or lentils. Unsalted nuts, nut butters, and seeds. Unsalted canned beans. Lean cuts of beef with fat trimmed off. Low-sodium, lean deli meat. Dairy Low-fat (1%) or fat-free (skim) milk. Fat-free, low-fat, or reduced-fat cheeses. Nonfat, low-sodium ricotta or cottage cheese. Low-fat or nonfat yogurt. Low-fat, low-sodium cheese. Fats and oils Soft margarine without trans fats. Vegetable oil. Low-fat, reduced-fat, or light mayonnaise and salad dressings (reduced-sodium). Canola, safflower, olive, soybean, and sunflower oils. Avocado. Seasoning and other foods Herbs. Spices. Seasoning mixes without salt. Unsalted popcorn and pretzels. Fat-free sweets. What foods are not recommended? The items listed may not be a complete list. Talk with your dietitian about what dietary choices are best for you. Grains Baked goods made with fat, such as croissants, muffins, or some breads. Dry pasta or rice meal packs. Vegetables Creamed or fried vegetables. Vegetables in a cheese sauce. Regular canned vegetables (not low-sodium or reduced-sodium). Regular canned tomato sauce and paste (not low-sodium or reduced-sodium). Regular tomato and vegetable juice (not low-sodium or reduced-sodium). Pickles. Olives. Fruits Canned fruit in a light or heavy syrup. Fried fruit. Fruit in cream or butter sauce. Meat and other protein foods Fatty cuts of meat. Ribs. Fried meat. Bacon. Sausage. Bologna and other processed lunch meats. Salami. Fatback. Hotdogs. Bratwurst. Salted nuts and seeds. Canned beans with added salt. Canned or smoked fish. Whole eggs or egg yolks. Chicken or turkey with skin. Dairy Whole or 2% milk, cream, and half-and-half. Whole or full-fat cream cheese. Whole-fat or sweetened yogurt. Full-fat cheese. Nondairy creamers. Whipped toppings.  Processed cheese and cheese spreads. Fats and oils Butter. Stick margarine. Lard. Shortening. Ghee. Bacon fat. Tropical oils, such as coconut, palm kernel, or palm oil. Seasoning and other foods Salted popcorn and pretzels. Onion salt, garlic salt, seasoned salt, table salt, and sea salt. Worcestershire sauce. Tartar sauce. Barbecue sauce. Teriyaki sauce. Soy sauce, including reduced-sodium. Steak sauce. Canned and packaged gravies. Fish sauce. Oyster sauce. Cocktail sauce. Horseradish that you find on the shelf. Ketchup. Mustard. Meat flavorings and tenderizers. Bouillon cubes. Hot sauce and Tabasco sauce. Premade or packaged marinades. Premade or packaged taco seasonings. Relishes. Regular salad dressings. Where to find more information:  National Heart, Lung, and Blood Institute: www.nhlbi.nih.gov  American Heart Association: www.heart.org Summary  The DASH eating plan is a healthy eating plan that has been shown to reduce high blood pressure (hypertension). It may also reduce your risk for type 2 diabetes, heart disease, and stroke.  With the DASH eating plan, you should limit salt (sodium) intake to 2,300 mg a day. If you have hypertension, you may need to reduce your sodium intake to 1,500 mg a day.  When on the DASH eating plan, aim to eat more fresh fruits and vegetables, whole grains, lean proteins, low-fat dairy, and heart-healthy fats.  Work with your health care provider or diet and nutrition specialist (dietitian) to adjust your eating plan to your individual   calorie needs. This information is not intended to replace advice given to you by your health care provider. Make sure you discuss any questions you have with your health care provider. Document Released: 08/24/2011 Document Revised: 08/28/2016 Document Reviewed: 08/28/2016 Elsevier Interactive Patient Education  2018 Elsevier Inc.  

## 2018-03-28 ENCOUNTER — Encounter: Payer: Self-pay | Admitting: Family Medicine

## 2018-04-08 DIAGNOSIS — H35412 Lattice degeneration of retina, left eye: Secondary | ICD-10-CM | POA: Diagnosis not present

## 2018-04-16 ENCOUNTER — Other Ambulatory Visit: Payer: Self-pay | Admitting: Family Medicine

## 2018-04-18 ENCOUNTER — Ambulatory Visit (INDEPENDENT_AMBULATORY_CARE_PROVIDER_SITE_OTHER): Payer: Medicare Other | Admitting: Family Medicine

## 2018-04-18 ENCOUNTER — Encounter: Payer: Self-pay | Admitting: Family Medicine

## 2018-04-18 VITALS — BP 155/59 | HR 63 | Temp 98.9°F | Ht 65.0 in | Wt 177.2 lb

## 2018-04-18 DIAGNOSIS — R2231 Localized swelling, mass and lump, right upper limb: Secondary | ICD-10-CM | POA: Insufficient documentation

## 2018-04-18 NOTE — Assessment & Plan Note (Signed)
diag mammogram and [place] ordered Probably just extra fatty tissue-- doubt lipoma or ca  Reassured pt but she would still like diag mammogram

## 2018-04-18 NOTE — Progress Notes (Signed)
Patient ID: Wendy Christensen, female    DOB: 06-16-1946  Age: 72 y.o. MRN: [id number]    Subjective:  Subjective  HPI Wendy Christensen presents for mass under R arm--- she has noticed over last several weeks and it has worsened.   It was not there when she saw her gyn and had her mammogram in feb.    Review of Systems  Constitutional: Negative for fever.  HENT: Negative for congestion.   Respiratory: Negative for shortness of breath.   Cardiovascular: Negative for chest pain, palpitations and leg swelling.  Gastrointestinal: Negative for abdominal pain, blood in stool and nausea.  Genitourinary: Negative for dysuria and frequency.  Skin: Negative for rash.  Allergic/Immunologic: Negative for environmental allergies.  Neurological: Negative for dizziness and headaches.  Psychiatric/Behavioral: The patient is not nervous/anxious.     History Past Medical History:  Diagnosis Date  . Allergic rhinitis   . Anxiety   . Asthma, intrinsic   . Depression   . Frequency of urination   . GERD (gastroesophageal reflux disease)   . Hyperlipidemia   . Hypertension   . Kidney stone    Left kidney-calcium   . OSA on CPAP    PT IN PROCESS OF GETTING MACHINE SET UP  . Thyroid goiter   . Urgency of urination   . Wears glasses     She has a past surgical history that includes Tubal ligation (1980's); Dilation and curettage of uterus (1990); Cystoscopy with retrograde pyelogram, ureteroscopy and stent placement (Left, 06/21/2015); Holmium laser application (Left, 25/0/0370); Stone extraction with basket (Left, 06/21/2015); Eye surgery; and Cataract extraction, bilateral.   Her family history includes Asthma in her father; COPD in her father; Heart attack in her maternal aunt; Heart disease in her mother; Hyperlipidemia in her unknown relative; Hypertension in her brother, brother, mother, and unknown relative; Kidney cancer in her maternal aunt; Stroke in her brother.She reports that she  has never smoked. She has never used smokeless tobacco. She reports that she drinks alcohol. She reports that she does not use drugs.  Current Outpatient Medications on File Prior to Visit  Medication Sig Dispense Refill  . albuterol (PROAIR HFA) 108 (90 Base) MCG/ACT inhaler INHALE 2 PUFFS INTO THE LUNGS EVERY 4 HOURS AS NEEDED FOR WHEEZING 8.5 g 3  . atorvastatin (LIPITOR) 40 MG tablet TAKE 1 TABLET BY MOUTH DAILY 90 tablet 0  . citalopram (CELEXA) 40 MG tablet TAKE 1 TABLET BY MOUTH EVERY MORNING 90 tablet 0  . fenofibrate 160 MG tablet Take 1 tablet (160 mg total) by mouth daily. 90 tablet 1  . FLOVENT HFA 110 MCG/ACT inhaler Inhale 2 puffs into the lungs daily as needed. 1 Inhaler 3  . fluticasone (FLONASE) 50 MCG/ACT nasal spray Place 2 sprays into both nostrils as needed.  3  . gentamicin (GARAMYCIN) 0.3 % ophthalmic solution Reported on 09/24/2015  0  . metoprolol (LOPRESSOR) 100 MG tablet Take 1 tablet (100 mg total) by mouth 2 (two) times daily. 180 tablet 1  . montelukast (SINGULAIR) 10 MG tablet Take 1 tablet (10 mg total) by mouth at bedtime. 90 tablet 3  . Multiple Vitamins-Minerals (MULTIVITAMIN WITH MINERALS) tablet Take 1 tablet by mouth daily.    Marland Kitchen olmesartan (BENICAR) 40 MG tablet Take 1 tablet (40 mg total) by mouth daily. 30 tablet 2  . omeprazole (PRILOSEC) 20 MG capsule TAKE ONE CAPSULE BY MOUTH ONCE DAILY 90 capsule 0  . Propylene Glycol-Glycerin (MOISTURE EYES OP) Apply to eye.    Marland Kitchen  vitamin B-12 (CYANOCOBALAMIN) 1000 MCG tablet Take 1,000 mcg by mouth daily.    . hydrochlorothiazide (HYDRODIURIL) 25 MG tablet Take 1 tablet (25 mg total) by mouth daily. 1 po qd prn 90 tablet 1   No current facility-administered medications on file prior to visit.      Objective:  Objective  Physical Exam  Pulmonary/Chest: She exhibits no mass and no tenderness. Right breast exhibits no inverted nipple, no nipple discharge, no skin change and no tenderness. Left breast exhibits no  inverted nipple, no mass, no nipple discharge, no skin change and no tenderness. No breast swelling, tenderness, discharge or bleeding.     BP (!) 155/59 (BP Location: Left Arm, Patient Position: Sitting, Cuff Size: Large)   Pulse 63   Temp 98.9 F (37.2 C) (Oral)   Ht 5\' 5"  (1.651 m)   Wt 177 lb 3.2 oz (80.4 kg)   SpO2 97%   BMI 29.49 kg/m  Wt Readings from Last 3 Encounters:  04/18/18 177 lb 3.2 oz (80.4 kg)  03/08/18 180 lb 12.8 oz (82 kg)  02/22/18 178 lb 3.2 oz (80.8 kg)     Lab Results  Component Value Date   WBC 6.4 11/17/2016   HGB 12.0 11/17/2016   HCT 37.5 11/17/2016   PLT 294 11/17/2016   GLUCOSE 102 (H) 02/22/2018   CHOL 381 (H) 02/22/2018   TRIG 125.0 02/22/2018   HDL 47.10 02/22/2018   LDLDIRECT 215.7 10/18/2011   LDLCALC 309 (H) 02/22/2018   ALT 11 02/22/2018   AST 16 02/22/2018   NA 139 02/22/2018   K 4.4 02/22/2018   CL 101 02/22/2018   CREATININE 1.22 (H) 02/22/2018   BUN 17 02/22/2018   CO2 31 02/22/2018   TSH 1.24 11/17/2016   HGBA1C 6.4 09/14/2014   MICROALBUR <0.7 03/23/2015    No results found.   Assessment & Plan:  Plan  I am having Wendy Christensen maintain her multivitamin with minerals, vitamin B-12, fluticasone, gentamicin, Propylene Glycol-Glycerin (MOISTURE EYES OP), montelukast, hydrochlorothiazide, metoprolol tartrate, FLOVENT HFA, albuterol, atorvastatin, citalopram, olmesartan, fenofibrate, and omeprazole.  No orders of the defined types were placed in this encounter.   Problem List Items Addressed This Visit    None    Visit Diagnoses    Mass of right axilla    -  Primary   Relevant Orders   MM Digital Diagnostic Bilat   US BREAST COMPLETE UNI RIGHT INC AXILLA      Follow-up: No follow-ups on file.  [name], DO

## 2018-04-18 NOTE — Patient Instructions (Signed)
Mammogram A mammogram is an X-ray of the breasts that is done to check for abnormal changes. This procedure can screen for and detect any changes that may suggest breast cancer. A mammogram can also identify other changes and variations in the breast, such as:  Inflammation of the breast tissue (mastitis).  An infected area that contains a collection of pus (abscess).  A fluid-filled sac (cyst).  Fibrocystic changes. This is when breast tissue becomes denser, which can make the tissue feel rope-like or uneven under the skin.  Tumors that are not cancerous (benign).  Tell a health care provider about:  Any allergies you have.  If you have breast implants.  If you have had previous breast disease, biopsy, or surgery.  If you are breastfeeding.  Any possibility that you could be pregnant, if this applies.  If you are younger than age 25.  If you have a family history of breast cancer. What are the risks? Generally, this is a safe procedure. However, problems may occur, including:  Exposure to radiation. Radiation levels are very low with this test.  The results being misinterpreted.  The need for further tests.  The inability of the mammogram to detect certain cancers.  What happens before the procedure?  Schedule your test about 1-2 weeks after your menstrual period. This is usually when your breasts are the least tender.  If you have had a mammogram done at a different facility in the past, get the mammogram X-rays or have them sent to your current exam facility in order to compare them.  Wash your breasts and under your arms the day of the test.  Do not wear deodorants, perfumes, lotions, or powders anywhere on your body on the day of the test.  Remove any jewelry from your neck.  Wear clothes that you can change into and out of easily. What happens during the procedure?  You will undress from the waist up and put on a gown.  You will stand in front of the  X-ray machine.  Each breast will be placed between two plastic or glass plates. The plates will compress your breast for a few seconds. Try to stay as relaxed as possible during the procedure. This does not cause any harm to your breasts and any discomfort you feel will be very brief.  X-rays will be taken from different angles of each breast. The procedure may vary among health care providers and hospitals. What happens after the procedure?  The mammogram will be examined by a specialist (radiologist).  You may need to repeat certain parts of the test, depending on the quality of the images. This is commonly done if the radiologist needs a better view of the breast tissue.  Ask when your test results will be ready. Make sure you get your test results.  You may resume your normal activities. This information is not intended to replace advice given to you by your health care provider. Make sure you discuss any questions you have with your health care provider. Document Released: 09/01/2000 Document Revised: 02/07/2016 Document Reviewed: 11/13/2014 Elsevier Interactive Patient Education  2018 Elsevier Inc[employer]  

## 2018-04-23 ENCOUNTER — Encounter: Payer: Self-pay | Admitting: Sports Medicine

## 2018-04-23 ENCOUNTER — Ambulatory Visit: Payer: Medicare Other | Admitting: Sports Medicine

## 2018-04-23 ENCOUNTER — Ambulatory Visit (INDEPENDENT_AMBULATORY_CARE_PROVIDER_SITE_OTHER): Payer: Medicare Other

## 2018-04-23 ENCOUNTER — Other Ambulatory Visit: Payer: Self-pay | Admitting: Sports Medicine

## 2018-04-23 VITALS — BP 158/77 | HR 59 | Resp 16

## 2018-04-23 DIAGNOSIS — M779 Enthesopathy, unspecified: Secondary | ICD-10-CM | POA: Diagnosis not present

## 2018-04-23 DIAGNOSIS — B373 Candidiasis of vulva and vagina: Secondary | ICD-10-CM | POA: Insufficient documentation

## 2018-04-23 DIAGNOSIS — S99911A Unspecified injury of right ankle, initial encounter: Secondary | ICD-10-CM

## 2018-04-23 DIAGNOSIS — M25571 Pain in right ankle and joints of right foot: Secondary | ICD-10-CM

## 2018-04-23 DIAGNOSIS — B3731 Acute candidiasis of vulva and vagina: Secondary | ICD-10-CM | POA: Insufficient documentation

## 2018-04-23 DIAGNOSIS — S93401A Sprain of unspecified ligament of right ankle, initial encounter: Secondary | ICD-10-CM

## 2018-04-23 DIAGNOSIS — N814 Uterovaginal prolapse, unspecified: Secondary | ICD-10-CM | POA: Insufficient documentation

## 2018-04-23 MED ORDER — METHYLPREDNISOLONE 4 MG PO TBPK: ORAL_TABLET | ORAL | 0 refills | Status: DC

## 2018-04-23 NOTE — Progress Notes (Signed)
Subjective:  Wendy Christensen is a 72 y.o. female patient who presents to office for evaluation of right ankle pain to lateral side. Patient complains of continued pain in the ankle x 4 months, hurt ankle while running in airport trying [place] make it to terminal. Patient has tried OTC pain cream with no relief in symptoms. Compression socks help. Pain is dull achy worse at end of day or with lots of standing/walking. Patient denies any other pedal complaints.   Review of Systems  Musculoskeletal: Positive for joint pain.  All other systems reviewed and are negative.   Patient Active Problem List   Diagnosis Date Noted  . Candidiasis of vagina 04/23/2018  . Uterine prolapse 04/23/2018  . Mass of right axilla 04/18/2018  . Chest pain 05/25/2017  . History of colon polyps 05/25/2017  . Hypertension 07/13/2015  . Renal insufficiency 03/30/2015  . Pain in the abdomen 02/24/2015  . Intrinsic asthma 01/26/2015  . Hyperglycemia 06/19/2013  . Hypothyroidism 06/17/2013  . OSA (obstructive sleep apnea) 01/14/2013  . BACK PAIN, THORACIC REGION, LEFT 11/18/2010  . ANXIETY DEPRESSION 05/20/2009  . SKIN TAG 04/19/2009  . POSTMENOPAUSAL STATUS 12/28/2008  . SINUSITIS- ACUTE-NOS 09/09/2008  . ANXIETY 09/01/2008  . GENERALIZED ANXIETY DISORDER 06/30/2008  . INSOMNIA 04/24/2008  . GOITER NOS 06/04/2007  . NUMBNESS, ARM 06/04/2007  . WEIGHT LOSS 06/04/2007  . Hyperlipidemia LDL goal <100 02/07/2007  . Essential hypertension 02/07/2007  . ALLERGIC RHINITIS 02/07/2007  . GERD 02/07/2007  . COLONOSCOPY, HX OF 02/07/2007  . DILATION AND CURETTAGE, HX OF 02/07/2007    Current Outpatient Medications on File Prior to Visit  Medication Sig Dispense Refill  . albuterol (PROAIR HFA) 108 (90 Base) MCG/ACT inhaler INHALE 2 PUFFS INTO THE LUNGS EVERY 4 HOURS AS NEEDED FOR WHEEZING 8.5 g 3  . atorvastatin (LIPITOR) 40 MG tablet TAKE 1 TABLET BY MOUTH DAILY 90 tablet 0  . citalopram (CELEXA) 40 MG  tablet TAKE 1 TABLET BY MOUTH EVERY MORNING 90 tablet 0  . fenofibrate 160 MG tablet Take 1 tablet (160 mg total) by mouth daily. 90 tablet 1  . FLOVENT HFA 110 MCG/ACT inhaler Inhale 2 puffs into the lungs daily as needed. 1 Inhaler 3  . fluticasone (FLONASE) 50 MCG/ACT nasal spray Place 2 sprays into both nostrils as needed.  3  . gentamicin (GARAMYCIN) 0.3 % ophthalmic solution Reported on 09/24/2015  0  . metoprolol (LOPRESSOR) 100 MG tablet Take 1 tablet (100 mg total) by mouth 2 (two) times daily. 180 tablet 1  . montelukast (SINGULAIR) 10 MG tablet Take 1 tablet (10 mg total) by mouth at bedtime. 90 tablet 3  . Multiple Vitamins-Minerals (MULTIVITAMIN WITH MINERALS) tablet Take 1 tablet by mouth daily.    Marland Kitchen olmesartan (BENICAR) 40 MG tablet Take 1 tablet (40 mg total) by mouth daily. 30 tablet 2  . omeprazole (PRILOSEC) 20 MG capsule TAKE ONE CAPSULE BY MOUTH ONCE DAILY 90 capsule 0  . Propylene Glycol-Glycerin (MOISTURE EYES OP) Apply to eye.    . vitamin B-12 (CYANOCOBALAMIN) 1000 MCG tablet Take 1,000 mcg by mouth daily.    . hydrochlorothiazide (HYDRODIURIL) 25 MG tablet Take 1 tablet (25 mg total) by mouth daily. 1 po qd prn 90 tablet 1   No current facility-administered medications on file prior to visit.     No Known Allergies  Objective:  General: Alert and oriented x3 in no acute distress  Dermatology: No open lesions bilateral lower extremities, no webspace macerations, no  ecchymosis bilateral, all nails x 10 are well manicured.  Vascular: Dorsalis Pedis and Posterior Tibial pedal pulses palpable, Capillary Fill Time 3 seconds,(+) pedal hair growth bilateral, no edema bilateral lower extremities, Temperature gradient within normal limits.  Neurology: Johney Maine sensation intact via light touch bilateral.   Musculoskeletal: Mild tenderness with palpation at lateral ankle at peroneal course, sinus tarsi and 5th met base on right. Negative talar tilt, Negative tib-fib stress, No  instability. No pain with calf compression bilateral. Range of motion within normal limits with mild guarding on right ankle. Strength within normal limits in all groups bilateral.   Gait: Antalgic gait  Xrays  Right Ankle   Impression: Mild soft tissue swelling, no fracture/discoloation, no other acute findings.   Assessment and Plan: Problem List Items Addressed This Visit    None    Visit Diagnoses    Injury of right ankle, initial encounter    -  Primary   Tendonitis       Relevant Medications   methylPREDNISolone (MEDROL DOSEPAK) 4 MG TBPK tablet   Sprain of right ankle, unspecified ligament, initial encounter       Acute right ankle pain           -Complete examination performed -Xrays reviewed -Discussed treatement options for tedonitis vs sprain -Rx Medrol dose pak -Dispensed ankle ganulet  -Recommend rest, ice, elevation and compression  -Recommend to avoid streneous activity that can aggrievate the ankle -Will plan MRI if no better in 6 weeks -Patient to return to office in 3 weeks for follow up eval or sooner if condition worsens.  Landis Martins, DPM

## 2018-04-24 ENCOUNTER — Ambulatory Visit
Admission: RE | Admit: 2018-04-24 | Discharge: 2018-04-24 | Disposition: A | Discharge status: Home / Self Care | Payer: Medicare Other | Source: Ambulatory Visit | Attending: Family Medicine | Admitting: Family Medicine

## 2018-04-24 ENCOUNTER — Other Ambulatory Visit: Payer: Self-pay | Admitting: Family Medicine

## 2018-04-24 DIAGNOSIS — R2231 Localized swelling, mass and lump, right upper limb: Secondary | ICD-10-CM

## 2018-04-24 DIAGNOSIS — N6320 Unspecified lump in the left breast, unspecified quadrant: Secondary | ICD-10-CM | POA: Diagnosis not present

## 2018-04-24 DIAGNOSIS — R928 Other abnormal and inconclusive findings on diagnostic imaging of breast: Secondary | ICD-10-CM | POA: Diagnosis not present

## 2018-05-21 ENCOUNTER — Encounter: Payer: Self-pay | Admitting: Sports Medicine

## 2018-05-21 ENCOUNTER — Other Ambulatory Visit: Payer: Self-pay | Admitting: Sports Medicine

## 2018-05-21 ENCOUNTER — Ambulatory Visit: Payer: Medicare Other | Admitting: Sports Medicine

## 2018-05-21 DIAGNOSIS — S99911D Unspecified injury of right ankle, subsequent encounter: Secondary | ICD-10-CM

## 2018-05-21 DIAGNOSIS — M25571 Pain in right ankle and joints of right foot: Secondary | ICD-10-CM | POA: Diagnosis not present

## 2018-05-21 DIAGNOSIS — M779 Enthesopathy, unspecified: Secondary | ICD-10-CM | POA: Diagnosis not present

## 2018-05-21 MED ORDER — MELOXICAM 15 MG PO TABS: 15.0000 mg | ORAL_TABLET | Freq: Every day | ORAL | 0 refills | Status: DC

## 2018-05-21 NOTE — Progress Notes (Signed)
Subjective:  Wendy Christensen is a 72 y.o. female patient who returns to office for evaluation of right ankle pain to lateral side. Patient reports that medrol dose pak has helped and that she has worn her brace. States that pain is about the same was bad on Sunday and is achy in the evening and worse with driving sometimes. Patient denies any other pedal complaints.   Patient Active Problem List   Diagnosis Date Noted  . Candidiasis of vagina 04/23/2018  . Uterine prolapse 04/23/2018  . Mass of right axilla 04/18/2018  . Chest pain 05/25/2017  . History of colon polyps 05/25/2017  . Hypertension 07/13/2015  . Renal insufficiency 03/30/2015  . Pain in the abdomen 02/24/2015  . Intrinsic asthma 01/26/2015  . Hyperglycemia 06/19/2013  . Hypothyroidism 06/17/2013  . OSA (obstructive sleep apnea) 01/14/2013  . BACK PAIN, THORACIC REGION, LEFT 11/18/2010  . ANXIETY DEPRESSION 05/20/2009  . SKIN TAG 04/19/2009  . POSTMENOPAUSAL STATUS 12/28/2008  . SINUSITIS- ACUTE-NOS 09/09/2008  . ANXIETY 09/01/2008  . GENERALIZED ANXIETY DISORDER 06/30/2008  . INSOMNIA 04/24/2008  . GOITER NOS 06/04/2007  . NUMBNESS, ARM 06/04/2007  . WEIGHT LOSS 06/04/2007  . Hyperlipidemia LDL goal <100 02/07/2007  . Essential hypertension 02/07/2007  . ALLERGIC RHINITIS 02/07/2007  . GERD 02/07/2007  . COLONOSCOPY, HX OF 02/07/2007  . DILATION AND CURETTAGE, HX OF 02/07/2007    Current Outpatient Medications on File Prior to Visit  Medication Sig Dispense Refill  . albuterol (PROAIR HFA) 108 (90 Base) MCG/ACT inhaler INHALE 2 PUFFS INTO THE LUNGS EVERY 4 HOURS AS NEEDED FOR WHEEZING 8.5 g 3  . atorvastatin (LIPITOR) 40 MG tablet TAKE 1 TABLET BY MOUTH DAILY 90 tablet 0  . citalopram (CELEXA) 40 MG tablet TAKE 1 TABLET BY MOUTH EVERY MORNING 90 tablet 0  . fenofibrate 160 MG tablet Take 1 tablet (160 mg total) by mouth daily. 90 tablet 1  . FLOVENT HFA 110 MCG/ACT inhaler Inhale 2 puffs into the lungs  daily as needed. 1 Inhaler 3  . fluticasone (FLONASE) 50 MCG/ACT nasal spray Place 2 sprays into both nostrils as needed.  3  . gentamicin (GARAMYCIN) 0.3 % ophthalmic solution Reported on 09/24/2015  0  . hydrochlorothiazide (HYDRODIURIL) 25 MG tablet Take 1 tablet (25 mg total) by mouth daily. 1 po qd prn 90 tablet 1  . methylPREDNISolone (MEDROL DOSEPAK) 4 MG TBPK tablet Take as directed 21 tablet 0  . metoprolol (LOPRESSOR) 100 MG tablet Take 1 tablet (100 mg total) by mouth 2 (two) times daily. 180 tablet 1  . montelukast (SINGULAIR) 10 MG tablet Take 1 tablet (10 mg total) by mouth at bedtime. 90 tablet 3  . Multiple Vitamins-Minerals (MULTIVITAMIN WITH MINERALS) tablet Take 1 tablet by mouth daily.    . olmesartan (BENICAR) 40 MG tablet Take 1 tablet (40 mg total) by mouth daily. 30 tablet 2  . omeprazole (PRILOSEC) 20 MG capsule TAKE ONE CAPSULE BY MOUTH ONCE DAILY 90 capsule 0  . Propylene Glycol-Glycerin (MOISTURE EYES OP) Apply to eye.    . vitamin B-12 (CYANOCOBALAMIN) 1000 MCG tablet Take 1,000 mcg by mouth daily.     No current facility-administered medications on file prior to visit.     No Known Allergies  Objective:  General: Alert and oriented x3 in no acute distress  Dermatology: No open lesions bilateral lower extremities, no webspace macerations, no ecchymosis bilateral, all nails x 10 are well manicured.  Vascular: Dorsalis Pedis and Posterior Tibial pedal pulses   palpable, Capillary Fill Time 3 seconds,(+) pedal hair growth bilateral, no edema bilateral lower extremities, Temperature gradient within normal limits.  Neurology: Gross sensation intact via light touch bilateral.   Musculoskeletal: There is tenderness with palpation at lateral ankle at peroneal course, sinus tarsi and 5th met base on right. Negative talar tilt, Negative tib-fib stress, No instability. No pain with calf compression bilateral. Range of motion within normal limits with mild guarding on right  ankle. Strength within normal limits in all groups bilateral.   Assessment and Plan: Problem List Items Addressed This Visit    None    Visit Diagnoses    Injury of right ankle, subsequent encounter    -  Primary   Tendonitis       Acute right ankle pain           -Complete examination performed -Previous Xrays reviewed -Discussed continued care for tedonitis vs sprain -Rx CAM Boot -Rx Mobic  -Recommend rest, ice, elevation, and compression  -Recommend to avoid streneous activity that can aggrievate the ankle as previous  -Will plan MRI if no better in 3 weeks; patient to call so that we can proceed with ordering MRI if no better -Patient to return to office after MRI or sooner if condition worsens.   , DPM  

## 2018-06-06 ENCOUNTER — Other Ambulatory Visit: Payer: Self-pay | Admitting: Family Medicine

## 2018-06-06 DIAGNOSIS — I10 Essential (primary) hypertension: Secondary | ICD-10-CM

## 2018-06-07 ENCOUNTER — Encounter: Payer: Self-pay | Admitting: Podiatry

## 2018-06-07 ENCOUNTER — Ambulatory Visit (INDEPENDENT_AMBULATORY_CARE_PROVIDER_SITE_OTHER): Payer: Medicare Other

## 2018-06-07 ENCOUNTER — Ambulatory Visit: Payer: Medicare Other | Admitting: Podiatry

## 2018-06-07 DIAGNOSIS — G8929 Other chronic pain: Secondary | ICD-10-CM

## 2018-06-07 DIAGNOSIS — M79671 Pain in right foot: Secondary | ICD-10-CM

## 2018-06-07 DIAGNOSIS — S99911D Unspecified injury of right ankle, subsequent encounter: Secondary | ICD-10-CM

## 2018-06-07 DIAGNOSIS — M779 Enthesopathy, unspecified: Secondary | ICD-10-CM

## 2018-06-07 DIAGNOSIS — M84374S Stress fracture, right foot, sequela: Secondary | ICD-10-CM

## 2018-06-09 ENCOUNTER — Other Ambulatory Visit: Payer: Self-pay | Admitting: Family Medicine

## 2018-06-09 NOTE — Progress Notes (Signed)
Subjective: 72 year old female presents the office with concerns of right foot pain.  She states that she has tried the steroids, immobilization, anti-inflammatories without any significant improvement.  She says that sharp pain to the onset aspect of her foot.  She states that she occasionally gets some pain to the ankle but the majority of pain she points is normal fifth metatarsal area.  She cannot wear the boot when she works.  She denies any recent injury or falls and she denies any significant increase in swelling or any changes since she was last seen by Dr. Cannon Kettle. Denies any systemic complaints such as fevers, chills, nausea, vomiting. No acute changes since last appointment, and no other complaints at this time.   Objective: AAO x3, NAD DP/PT pulses palpable bilaterally, CRT less than 3 seconds At this time there is no pain to the ankle joint itself.  Is no pain to the lateral and medial ankle ligaments.  There is no pain to the flexor, extensor, Achilles tendon.  Subjectively she is having pain to more towards the fifth metatarsal base joint insertional peroneal to the shaft of the fifth metatarsal.  There is no pain to vibratory sensation there is no significant edema, erythema, increase in warmth.  No open lesions or pre-ulcerative lesions.  No pain with calf compression, swelling, warmth, erythema  Assessment: Chronic right foot pain  Plan: -All treatment options discussed with the patient including all alternatives, risks, complications.  -Repeat x-rays were obtained and reviewed with her.  There is no definitive evidence of acute fracture or stress fracture.  Mild arthritic changes present. -At this time given her pain I do recommend an MRI.  Order the MRI of the foot as this is where she has majority of her pain she is having no pain to the ankle.  I did dispense a surgical shoe for her as well but I think she can work better in this the boot.  Recheck after MRI or sooner if any  issues are to arise.  -Patient encouraged to call the office with any questions, concerns, change in symptoms.   Trula Slade DPM

## 2018-06-11 ENCOUNTER — Ambulatory Visit: Payer: Medicare Other

## 2018-06-11 DIAGNOSIS — H35412 Lattice degeneration of retina, left eye: Secondary | ICD-10-CM | POA: Diagnosis not present

## 2018-06-13 ENCOUNTER — Ambulatory Visit (HOSPITAL_COMMUNITY)
Admission: RE | Admit: 2018-06-13 | Discharge: 2018-06-13 | Disposition: A | Discharge status: Home / Self Care | Payer: Medicare Other | Source: Ambulatory Visit | Attending: Podiatry | Admitting: Podiatry

## 2018-06-13 DIAGNOSIS — S96811A Strain of other specified muscles and tendons at ankle and foot level, right foot, initial encounter: Secondary | ICD-10-CM | POA: Diagnosis not present

## 2018-06-13 DIAGNOSIS — M79671 Pain in right foot: Secondary | ICD-10-CM | POA: Diagnosis present

## 2018-06-13 DIAGNOSIS — M659 Synovitis and tenosynovitis, unspecified: Secondary | ICD-10-CM | POA: Diagnosis not present

## 2018-06-13 DIAGNOSIS — G8929 Other chronic pain: Secondary | ICD-10-CM | POA: Diagnosis present

## 2018-06-13 DIAGNOSIS — M24874 Other specific joint derangements of right foot, not elsewhere classified: Secondary | ICD-10-CM | POA: Diagnosis not present

## 2018-06-13 DIAGNOSIS — X58XXXA Exposure to other specified factors, initial encounter: Secondary | ICD-10-CM | POA: Diagnosis not present

## 2018-06-13 DIAGNOSIS — M65871 Other synovitis and tenosynovitis, right ankle and foot: Secondary | ICD-10-CM | POA: Diagnosis not present

## 2018-06-28 ENCOUNTER — Other Ambulatory Visit: Payer: Self-pay | Admitting: Family Medicine

## 2018-06-28 DIAGNOSIS — F411 Generalized anxiety disorder: Secondary | ICD-10-CM

## 2018-07-02 ENCOUNTER — Ambulatory Visit: Payer: Medicare Other | Admitting: Sports Medicine

## 2018-07-09 ENCOUNTER — Other Ambulatory Visit: Payer: Self-pay

## 2018-07-09 NOTE — Patient Outreach (Signed)
[facility]) Care Management  07/09/2018  Wendy Christensen 1946/08/09 [id number]   Medication Adherence to [name] spoke with patient husband he ask for a call back tomorrow at 10 am that's when patient will be home. [name] is showing past due under [facility].  [place] Management Direct Dial [phone]  Fax [phone] [email or web address]

## 2018-07-10 ENCOUNTER — Other Ambulatory Visit: Payer: Self-pay | Admitting: *Deleted

## 2018-07-10 DIAGNOSIS — E785 Hyperlipidemia, unspecified: Secondary | ICD-10-CM

## 2018-07-10 MED ORDER — ATORVASTATIN CALCIUM 40 MG PO TABS: 40.0000 mg | ORAL_TABLET | Freq: Every day | ORAL | 1 refills | Status: DC

## 2018-07-15 DIAGNOSIS — J300 Vasomotor rhinitis: Secondary | ICD-10-CM | POA: Diagnosis not present

## 2018-07-15 DIAGNOSIS — J4530 Mild persistent asthma, uncomplicated: Secondary | ICD-10-CM | POA: Diagnosis not present

## 2018-07-15 DIAGNOSIS — T781XXD Other adverse food reactions, not elsewhere classified, subsequent encounter: Secondary | ICD-10-CM | POA: Diagnosis not present

## 2018-07-15 DIAGNOSIS — H1045 Other chronic allergic conjunctivitis: Secondary | ICD-10-CM | POA: Diagnosis not present

## 2018-07-16 ENCOUNTER — Ambulatory Visit: Payer: Medicare Other | Admitting: Sports Medicine

## 2018-07-16 ENCOUNTER — Encounter: Payer: Self-pay | Admitting: Sports Medicine

## 2018-07-16 DIAGNOSIS — S96911A Strain of unspecified muscle and tendon at ankle and foot level, right foot, initial encounter: Secondary | ICD-10-CM | POA: Diagnosis not present

## 2018-07-16 DIAGNOSIS — G8929 Other chronic pain: Secondary | ICD-10-CM | POA: Diagnosis not present

## 2018-07-16 DIAGNOSIS — S99911D Unspecified injury of right ankle, subsequent encounter: Secondary | ICD-10-CM

## 2018-07-16 DIAGNOSIS — M79671 Pain in right foot: Secondary | ICD-10-CM | POA: Diagnosis not present

## 2018-07-16 NOTE — Patient Instructions (Signed)
Pre-Operative Instructions  Congratulations, you have decided to take an important step towards improving your quality of life.  You can be assured that the doctors and staff at [facility] will be with you every step of the way.  Here are some important things you should know:  1. Plan to be at the surgery center/hospital at least 1 (one) hour prior to your scheduled time, unless otherwise directed by the surgical center/hospital staff.  You must have a responsible adult accompany you, remain during the surgery and drive you home.  Make sure you have directions to the surgical center/hospital to ensure you arrive on time. 2. If you are having surgery at [facility] or [facility] hospitals, you will need a copy of your medical history and physical form from your family physician within one month prior to the date of surgery. We will give you a form for your primary physician to complete.  3. We make every effort to accommodate the date you request for surgery.  However, there are times where surgery dates or times have to be moved.  We will contact you as soon as possible if a change in schedule is required.   4. No aspirin/ibuprofen for one week before surgery.  If you are on aspirin, any non-steroidal anti-inflammatory medications (Mobic, Aleve, Ibuprofen) should not be taken seven (7) days prior to your surgery.  You make take Tylenol for pain prior to surgery.  5. Medications - If you are taking daily heart and blood pressure medications, seizure, reflux, allergy, asthma, anxiety, pain or diabetes medications, make sure you notify the surgery center/hospital before the day of surgery so they can tell you which medications you should take or avoid the day of surgery. 6. No food or drink after midnight the night before surgery unless directed otherwise by surgical center/hospital staff. 7. No alcoholic beverages 24-hours prior to surgery.  No smoking 24-hours prior or 24-hours after  surgery. 8. Wear loose pants or shorts. They should be loose enough to fit over bandages, boots, and casts. 9. Don't wear slip-on shoes. Sneakers are preferred. 10. Bring your boot with you to the surgery center/hospital.  Also bring crutches or a walker if your physician has prescribed it for you.  If you do not have this equipment, it will be provided for you after surgery. 11. If you have not been contacted by the surgery center/hospital by the day before your surgery, call to confirm the date and time of your surgery. 12. Leave-time from work may vary depending on the type of surgery you have.  Appropriate arrangements should be made prior to surgery with your employer. 13. Prescriptions will be provided immediately following surgery by your doctor.  Fill these as soon as possible after surgery and take the medication as directed. Pain medications will not be refilled on weekends and must be approved by the doctor. 14. Remove nail polish on the operative foot and avoid getting pedicures prior to surgery. 15. Wash the night before surgery.  The night before surgery wash the foot and leg well with water and the antibacterial soap provided. Be sure to pay special attention to beneath the toenails and in between the toes.  Wash for at least three (3) minutes. Rinse thoroughly with water and dry well with a towel.  Perform this wash unless told not to do so by your physician.  Enclosed: 1 Ice pack (please put in freezer the night before surgery)   1 Hibiclens skin cleaner     Pre-op instructions  If you have any questions regarding the instructions, please do not hesitate to call our office.  [place]: [address], [place], [place] [address] -- [phone]  [place]: [address] [place], [place] [address] -- [phone]  [place]: [address]  [place], [place] [address] -- [phone]  [place]: [address], [address], [place], [place] [address] -- [phone]  Website:  [email or web address] 

## 2018-07-16 NOTE — Progress Notes (Signed)
Subjective:  Wendy Christensen is a 72 y.o. female patient who returns to office for evaluation of right ankle pain to lateral side. Patient reports that she still has pain and tenderness states that she has been using her postoperative shoe but still has pain along the lateral side of her right foot and ankle states that her meloxicam seems to be helping and requests a refill however is also here to discuss her MRI results.  Patient denies any changes with medication or health and denies any new symptoms like nausea vomiting fever chills or any other constitutional symptoms at this time.  Patient Active Problem List   Diagnosis Date Noted  . Tendon tear, ankle, right, initial encounter 07/16/2018  . Candidiasis of vagina 04/23/2018  . Uterine prolapse 04/23/2018  . Mass of right axilla 04/18/2018  . Chest pain 05/25/2017  . History of colon polyps 05/25/2017  . Hypertension 07/13/2015  . Renal insufficiency 03/30/2015  . Pain in the abdomen 02/24/2015  . Intrinsic asthma 01/26/2015  . Hyperglycemia 06/19/2013  . Hypothyroidism 06/17/2013  . OSA (obstructive sleep apnea) 01/14/2013  . BACK PAIN, THORACIC REGION, LEFT 11/18/2010  . ANXIETY DEPRESSION 05/20/2009  . SKIN TAG 04/19/2009  . POSTMENOPAUSAL STATUS 12/28/2008  . SINUSITIS- ACUTE-NOS 09/09/2008  . ANXIETY 09/01/2008  . GENERALIZED ANXIETY DISORDER 06/30/2008  . INSOMNIA 04/24/2008  . GOITER NOS 06/04/2007  . NUMBNESS, ARM 06/04/2007  . WEIGHT LOSS 06/04/2007  . Hyperlipidemia LDL goal <100 02/07/2007  . Essential hypertension 02/07/2007  . ALLERGIC RHINITIS 02/07/2007  . GERD 02/07/2007  . COLONOSCOPY, HX OF 02/07/2007  . DILATION AND CURETTAGE, HX OF 02/07/2007    Current Outpatient Medications on File Prior to Visit  Medication Sig Dispense Refill  . albuterol (PROAIR HFA) 108 (90 Base) MCG/ACT inhaler INHALE 2 PUFFS INTO THE LUNGS EVERY 4 HOURS AS NEEDED FOR WHEEZING 8.5 g 3  . atorvastatin (LIPITOR) 40 MG  tablet Take 1 tablet (40 mg total) by mouth daily. 90 tablet 1  . azelastine (OPTIVAR) 0.05 % ophthalmic solution     . citalopram (CELEXA) 40 MG tablet TAKE 1 TABLET BY MOUTH EVERY MORNING 90 tablet 0  . fenofibrate 160 MG tablet Take 1 tablet (160 mg total) by mouth daily. 90 tablet 1  . FLOVENT HFA 110 MCG/ACT inhaler Inhale 2 puffs into the lungs daily as needed. 1 Inhaler 3  . fluticasone (FLONASE) 50 MCG/ACT nasal spray Place 2 sprays into both nostrils as needed.  3  . gentamicin (GARAMYCIN) 0.3 % ophthalmic solution Reported on 09/24/2015  0  . meloxicam (MOBIC) 15 MG tablet TAKE 1 TABLET BY MOUTH DAILY 90 tablet 0  . methylPREDNISolone (MEDROL DOSEPAK) 4 MG TBPK tablet Take as directed 21 tablet 0  . metoprolol (LOPRESSOR) 100 MG tablet Take 1 tablet (100 mg total) by mouth 2 (two) times daily. 180 tablet 1  . Metoprolol Succinate (TOPROL XL PO) Toprol XL    . montelukast (SINGULAIR) 10 MG tablet Take 1 tablet (10 mg total) by mouth at bedtime. 90 tablet 3  . Multiple Vitamins-Minerals (MULTIVITAMIN ADULT PO) multivitamin    . Multiple Vitamins-Minerals (MULTIVITAMIN WITH MINERALS) tablet Take 1 tablet by mouth daily.    Marland Kitchen olmesartan (BENICAR) 40 MG tablet TAKE 1 TABLET BY MOUTH DAILY 90 tablet 1  . omeprazole (PRILOSEC) 20 MG capsule TAKE ONE CAPSULE BY MOUTH DAILY 90 capsule 0  . Propylene Glycol-Glycerin (MOISTURE EYES OP) Apply to eye.    . vitamin B-12 (CYANOCOBALAMIN) 1000 MCG  tablet Take 1,000 mcg by mouth daily.    . hydrochlorothiazide (HYDRODIURIL) 25 MG tablet Take 1 tablet (25 mg total) by mouth daily. 1 po qd prn 90 tablet 1   No current facility-administered medications on file prior to visit.     No Known Allergies  Objective:  General: Alert and oriented x3 in no acute distress  Dermatology: No open lesions bilateral lower extremities, no webspace macerations, no ecchymosis bilateral, all nails x 10 are well manicured.  Vascular: Dorsalis Pedis and Posterior  Tibial pedal pulses palpable, Capillary Fill Time 3 seconds,(+) pedal hair growth bilateral, no edema bilateral lower extremities, Temperature gradient within normal limits.  Neurology: Johney Maine sensation intact via light touch bilateral.   Musculoskeletal: There is tenderness with palpation at lateral ankle at peroneal course, sinus tarsi and 5th met base on right like previous however there is still pain mostly along the peroneal course. Negative talar tilt, Negative tib-fib stress, No instability. No pain with calf compression bilateral. Range of motion within normal limits with mild guarding on right ankle. Strength within normal limits in all groups bilateral.   Assessment and Plan: Problem List Items Addressed This Visit      Musculoskeletal and Integument   Tendon tear, ankle, right, initial encounter - Primary    Other Visit Diagnoses    Injury of right ankle, subsequent encounter       Chronic foot pain, right           -Complete examination performed -MRI results reviewed revealing partial tear of the Perroneous longus tendon -Discussed treatment options for tendon tear -Patient opt for surgical management. Consent obtained for right foot tendon repair of the peroneal tendon.  Pre and Post op course explained. Risks, benefits, alternatives explained. No guarantees given or implied. Surgical booking slip submitted and provided patient with Surgical packet and info for [facility]. -Dispensed CAM Walker to use post op -Continue with Mobic and must stop 7 days prior to surgery -Recommend rest, ice, elevation, and compression until time for surgery -Recommend to avoid streneous activity that can aggrievate the ankle as previous until time for surgery and may continue with postop shoe until time for surgery -Patient to return to office after surgery or sooner if problems or issues arise.   Landis Martins, DPM

## 2018-07-19 ENCOUNTER — Telehealth: Payer: Self-pay | Admitting: *Deleted

## 2018-07-19 NOTE — Telephone Encounter (Signed)
"  I was calling to talk to you about my surgery that is scheduled."

## 2018-07-22 ENCOUNTER — Ambulatory Visit (INDEPENDENT_AMBULATORY_CARE_PROVIDER_SITE_OTHER): Payer: Medicare Other | Admitting: Family Medicine

## 2018-07-22 ENCOUNTER — Encounter: Payer: Self-pay | Admitting: Family Medicine

## 2018-07-22 VITALS — BP 150/70 | HR 55 | Temp 98.3°F | Resp 16 | Ht 65.0 in | Wt 179.8 lb

## 2018-07-22 DIAGNOSIS — I10 Essential (primary) hypertension: Secondary | ICD-10-CM | POA: Diagnosis not present

## 2018-07-22 DIAGNOSIS — E785 Hyperlipidemia, unspecified: Secondary | ICD-10-CM | POA: Diagnosis not present

## 2018-07-22 DIAGNOSIS — Z01818 Encounter for other preprocedural examination: Secondary | ICD-10-CM | POA: Diagnosis not present

## 2018-07-22 NOTE — Assessment & Plan Note (Signed)
Encouraged heart healthy diet, increase exercise, avoid trans fats, consider a krill oil cap daily 

## 2018-07-22 NOTE — Progress Notes (Signed)
Patient ID: Wendy Christensen, female    DOB: [birth year]  Age: 72 y.o. MRN: [id number]    Subjective:  Subjective  HPI Wendy Christensen presents for f/u prior to floot surgery.  Pt has torn tendon in R foot  Review of Systems  Constitutional: Negative for appetite change, diaphoresis, fatigue and unexpected weight change.  Eyes: Negative for pain, redness and visual disturbance.  Respiratory: Negative for cough, chest tightness, shortness of breath and wheezing.   Cardiovascular: Negative for chest pain, palpitations and leg swelling.  Endocrine: Negative for cold intolerance, heat intolerance, polydipsia, polyphagia and polyuria.  Genitourinary: Negative for difficulty urinating, dysuria and frequency.  Neurological: Negative for dizziness, light-headedness, numbness and headaches.    History Past Medical History:  Diagnosis Date  . Allergic rhinitis   . Anxiety   . Asthma, intrinsic   . Depression   . Frequency of urination   . GERD (gastroesophageal reflux disease)   . Hyperlipidemia   . Hypertension   . Kidney stone    Left kidney-calcium   . OSA on CPAP    PT IN PROCESS OF GETTING MACHINE SET UP  . Thyroid goiter   . Urgency of urination   . Wears glasses     She has a past surgical history that includes Tubal ligation (1980's); Dilation and curettage of uterus (1990); Cystoscopy with retrograde pyelogram, ureteroscopy and stent placement (Left, 06/21/2015); Holmium laser application (Left, 51/04/8415); Stone extraction with basket (Left, 06/21/2015); Eye surgery; Cataract extraction, bilateral; and Breast biopsy.   Her family history includes Asthma in her father; Breast cancer in her cousin and cousin; COPD in her father; Heart attack in her maternal aunt; Heart disease in her mother; Hyperlipidemia in her unknown relative; Hypertension in her brother, brother, mother, and unknown relative; Kidney cancer in her maternal aunt; Stroke in her brother.She reports that  she has never smoked. She has never used smokeless tobacco. She reports that she drinks alcohol. She reports that she does not use drugs.  Current Outpatient Medications on File Prior to Visit  Medication Sig Dispense Refill  . albuterol (PROAIR HFA) 108 (90 Base) MCG/ACT inhaler INHALE 2 PUFFS INTO THE LUNGS EVERY 4 HOURS AS NEEDED FOR WHEEZING 8.5 g 3  . atorvastatin (LIPITOR) 40 MG tablet Take 1 tablet (40 mg total) by mouth daily. 90 tablet 1  . azelastine (OPTIVAR) 0.05 % ophthalmic solution     . citalopram (CELEXA) 40 MG tablet TAKE 1 TABLET BY MOUTH EVERY MORNING 90 tablet 0  . fenofibrate 160 MG tablet Take 1 tablet (160 mg total) by mouth daily. 90 tablet 1  . FLOVENT HFA 110 MCG/ACT inhaler Inhale 2 puffs into the lungs daily as needed. 1 Inhaler 3  . fluticasone (FLONASE) 50 MCG/ACT nasal spray Place 2 sprays into both nostrils as needed.  3  . gentamicin (GARAMYCIN) 0.3 % ophthalmic solution Reported on 09/24/2015  0  . meloxicam (MOBIC) 15 MG tablet TAKE 1 TABLET BY MOUTH DAILY 90 tablet 0  . metoprolol (LOPRESSOR) 100 MG tablet Take 1 tablet (100 mg total) by mouth 2 (two) times daily. 180 tablet 1  . montelukast (SINGULAIR) 10 MG tablet Take 1 tablet (10 mg total) by mouth at bedtime. 90 tablet 3  . Multiple Vitamins-Minerals (MULTIVITAMIN ADULT PO) multivitamin    . Multiple Vitamins-Minerals (MULTIVITAMIN WITH MINERALS) tablet Take 1 tablet by mouth daily.    Marland Kitchen olmesartan (BENICAR) 40 MG tablet TAKE 1 TABLET BY MOUTH DAILY 90 tablet 1  .  omeprazole (PRILOSEC) 20 MG capsule TAKE ONE CAPSULE BY MOUTH DAILY 90 capsule 0  . Propylene Glycol-Glycerin (MOISTURE EYES OP) Apply to eye.    . vitamin B-12 (CYANOCOBALAMIN) 1000 MCG tablet Take 1,000 mcg by mouth daily.    . hydrochlorothiazide (HYDRODIURIL) 25 MG tablet Take 1 tablet (25 mg total) by mouth daily. 1 po qd prn 90 tablet 1   No current facility-administered medications on file prior to visit.      Objective:  Objective    Physical Exam  Constitutional: She is oriented to person, place, and time. She appears well-developed and well-nourished.  HENT:  Head: Normocephalic and atraumatic.  Eyes: Conjunctivae and EOM are normal.  Neck: Normal range of motion. Neck supple. No JVD present. Carotid bruit is not present. No thyromegaly present.  Cardiovascular: Normal rate, regular rhythm and normal heart sounds.  No murmur heard. Pulmonary/Chest: Effort normal and breath sounds normal. No respiratory distress. She has no wheezes. She has no rales. She exhibits no tenderness.  Musculoskeletal: She exhibits tenderness. She exhibits no edema.  r foot in post op shoe  Neurological: She is alert and oriented to person, place, and time.  Psychiatric: She has a normal mood and affect.   BP (!) 150/70   Pulse (!) 55   Temp 98.3 F (36.8 C) (Oral)   Resp 16   Ht 5\' 5"  (1.651 m)   Wt 179 lb 12.8 oz (81.6 kg)   SpO2 98%   BMI 29.92 kg/m  Wt Readings from Last 3 Encounters:  07/22/18 179 lb 12.8 oz (81.6 kg)  04/18/18 177 lb 3.2 oz (80.4 kg)  03/08/18 180 lb 12.8 oz (82 kg)     Lab Results  Component Value Date   WBC 6.4 11/17/2016   HGB 12.0 11/17/2016   HCT 37.5 11/17/2016   PLT 294 11/17/2016   GLUCOSE 102 (H) 02/22/2018   CHOL 381 (H) 02/22/2018   TRIG 125.0 02/22/2018   HDL 47.10 02/22/2018   LDLDIRECT 215.7 10/18/2011   LDLCALC 309 (H) 02/22/2018   ALT 11 02/22/2018   AST 16 02/22/2018   NA 139 02/22/2018   K 4.4 02/22/2018   CL 101 02/22/2018   CREATININE 1.22 (H) 02/22/2018   BUN 17 02/22/2018   CO2 31 02/22/2018   TSH 1.24 11/17/2016   HGBA1C 6.4 09/14/2014   MICROALBUR <0.7 03/23/2015  ekg--  No acute changes from previous  Mr Foot Right Wo Contrast  Result Date: 06/14/2018 CLINICAL DATA:  Lateral foot pain extending from the 5th metatarsal base into the heel. Patient reports 2 injuries over the last 6 months. Question stress fracture versus tendinitis. EXAM: MRI OF THE RIGHT FOREFOOT  WITHOUT CONTRAST TECHNIQUE: Multiplanar, multisequence MR imaging of the right foot was performed. No intravenous contrast was administered. COMPARISON:  Radiographs 06/07/2018 FINDINGS: Bones/Joint/Cartilage Large field-of-view imaging includes most of the foot with the exception of the distal toes. There is no evidence of acute fracture or dislocation. The alignment is normal at the Lisfranc joint. There are mild degenerative changes at the 1st metatarsophalangeal joint. No significant joint effusions. Ligaments The Lisfranc ligament is intact. Muscles and Tendons There is prominent tendinosis and partial tearing of the peroneus longus tendon where it passes under the calcaneal cuboid articulation, best seen on images 28-36 of series 9. There is associated mild tenosynovitis. These changes are distal to an os peroneum demonstrated on the patient's radiographs. No definite full-thickness tendon tear. The peroneus brevis tendon appears intact. The additional foot  muscles and tendons appear intact. Soft tissues Otherwise unremarkable. IMPRESSION: 1. Distal peroneus longus tenosynovitis and partial tearing. No full-thickness tear identified. 2. No evidence of stress fracture or other significant osseous findings in the right foot. 3. Mild degenerative changes at the 1st metatarsophalangeal joint. Electronically Signed   By: Richardean Sale M.D.   On: 06/14/2018 08:58     Assessment & Plan:  Plan  I have discontinued Wendy Christensen's methylPREDNISolone and Metoprolol Succinate (TOPROL XL PO). I am also having her maintain her multivitamin with minerals, vitamin B-12, fluticasone, gentamicin, Propylene Glycol-Glycerin (MOISTURE EYES OP), montelukast, hydrochlorothiazide, metoprolol tartrate, FLOVENT HFA, albuterol, fenofibrate, Multiple Vitamins-Minerals (MULTIVITAMIN ADULT PO), meloxicam, olmesartan, citalopram, omeprazole, atorvastatin, and azelastine.  No orders of the defined types were placed in this  encounter.   Problem List Items Addressed This Visit      Unprioritized   Essential hypertension - Primary   Relevant Orders   CBC with Differential/Platelet   Lipid panel   Comprehensive metabolic panel   EKG 59-YVOP (Completed)   Hyperlipidemia LDL goal <100    Encouraged heart healthy diet, increase exercise, avoid trans fats, consider a krill oil cap daily       Other Visit Diagnoses    Hyperlipidemia, unspecified hyperlipidemia type       Relevant Orders   CBC with Differential/Platelet   Lipid panel   Comprehensive metabolic panel   EKG 92-TWKM (Completed)      Follow-up: Return in about 3 months (around 10/22/2018), or if symptoms worsen or fail to improve, for hypertension.  [name], DO

## 2018-07-22 NOTE — Patient Instructions (Signed)
DASH Eating Plan DASH stands for "Dietary Approaches to Stop Hypertension." The DASH eating plan is a healthy eating plan that has been shown to reduce high blood pressure (hypertension). It may also reduce your risk for type 2 diabetes, heart disease, and stroke. The DASH eating plan may also help with weight loss. What are tips for following this plan? General guidelines  Avoid eating more than 2,300 mg (milligrams) of salt (sodium) a day. If you have hypertension, you may need to reduce your sodium intake to 1,500 mg a day.  Limit alcohol intake to no more than 1 drink a day for nonpregnant women and 2 drinks a day for men. One drink equals 12 oz of beer, 5 oz of wine, or 1 oz of hard liquor.  Work with your health care provider to maintain a healthy body weight or to lose weight. Ask what an ideal weight is for you.  Get at least 30 minutes of exercise that causes your heart to beat faster (aerobic exercise) most days of the week. Activities may include walking, swimming, or biking.  Work with your health care provider or diet and nutrition specialist (dietitian) to adjust your eating plan to your individual calorie needs. Reading food labels  Check food labels for the amount of sodium per serving. Choose foods with less than 5 percent of the Daily Value of sodium. Generally, foods with less than 300 mg of sodium per serving fit into this eating plan.  To find whole grains, look for the word "whole" as the first word in the ingredient list. Shopping  Buy products labeled as "low-sodium" or "no salt added."  Buy fresh foods. Avoid canned foods and premade or frozen meals. Cooking  Avoid adding salt when cooking. Use salt-free seasonings or herbs instead of table salt or sea salt. Check with your health care provider or pharmacist before using salt substitutes.  Do not fry foods. Cook foods using healthy methods such as baking, boiling, grilling, and broiling instead.  Cook with  heart-healthy oils, such as olive, canola, soybean, or sunflower oil. Meal planning   Eat a balanced diet that includes: ? 5 or more servings of fruits and vegetables each day. At each meal, try to fill half of your plate with fruits and vegetables. ? Up to 6-8 servings of whole grains each day. ? Less than 6 oz of lean meat, poultry, or fish each day. A 3-oz serving of meat is about the same size as a deck of cards. One egg equals 1 oz. ? 2 servings of low-fat dairy each day. ? A serving of nuts, seeds, or beans 5 times each week. ? Heart-healthy fats. Healthy fats called Omega-3 fatty acids are found in foods such as flaxseeds and coldwater fish, like sardines, salmon, and mackerel.  Limit how much you eat of the following: ? Canned or prepackaged foods. ? Food that is high in trans fat, such as fried foods. ? Food that is high in saturated fat, such as fatty meat. ? Sweets, desserts, sugary drinks, and other foods with added sugar. ? Full-fat dairy products.  Do not salt foods before eating.  Try to eat at least 2 vegetarian meals each week.  Eat more home-cooked food and less restaurant, buffet, and fast food.  When eating at a restaurant, ask that your food be prepared with less salt or no salt, if possible. What foods are recommended? The items listed may not be a complete list. Talk with your dietitian about what   dietary choices are best for you. Grains Whole-grain or whole-wheat bread. Whole-grain or whole-wheat pasta. Brown rice. Oatmeal. Quinoa. Bulgur. Whole-grain and low-sodium cereals. Pita bread. Low-fat, low-sodium crackers. Whole-wheat flour tortillas. Vegetables Fresh or frozen vegetables (raw, steamed, roasted, or grilled). Low-sodium or reduced-sodium tomato and vegetable juice. Low-sodium or reduced-sodium tomato sauce and tomato paste. Low-sodium or reduced-sodium canned vegetables. Fruits All fresh, dried, or frozen fruit. Canned fruit in natural juice (without  added sugar). Meat and other protein foods Skinless chicken or turkey. Ground chicken or turkey. Pork with fat trimmed off. Fish and seafood. Egg whites. Dried beans, peas, or lentils. Unsalted nuts, nut butters, and seeds. Unsalted canned beans. Lean cuts of beef with fat trimmed off. Low-sodium, lean deli meat. Dairy Low-fat (1%) or fat-free (skim) milk. Fat-free, low-fat, or reduced-fat cheeses. Nonfat, low-sodium ricotta or cottage cheese. Low-fat or nonfat yogurt. Low-fat, low-sodium cheese. Fats and oils Soft margarine without trans fats. Vegetable oil. Low-fat, reduced-fat, or light mayonnaise and salad dressings (reduced-sodium). Canola, safflower, olive, soybean, and sunflower oils. Avocado. Seasoning and other foods Herbs. Spices. Seasoning mixes without salt. Unsalted popcorn and pretzels. Fat-free sweets. What foods are not recommended? The items listed may not be a complete list. Talk with your dietitian about what dietary choices are best for you. Grains Baked goods made with fat, such as croissants, muffins, or some breads. Dry pasta or rice meal packs. Vegetables Creamed or fried vegetables. Vegetables in a cheese sauce. Regular canned vegetables (not low-sodium or reduced-sodium). Regular canned tomato sauce and paste (not low-sodium or reduced-sodium). Regular tomato and vegetable juice (not low-sodium or reduced-sodium). Pickles. Olives. Fruits Canned fruit in a light or heavy syrup. Fried fruit. Fruit in cream or butter sauce. Meat and other protein foods Fatty cuts of meat. Ribs. Fried meat. Bacon. Sausage. Bologna and other processed lunch meats. Salami. Fatback. Hotdogs. Bratwurst. Salted nuts and seeds. Canned beans with added salt. Canned or smoked fish. Whole eggs or egg yolks. Chicken or turkey with skin. Dairy Whole or 2% milk, cream, and half-and-half. Whole or full-fat cream cheese. Whole-fat or sweetened yogurt. Full-fat cheese. Nondairy creamers. Whipped toppings.  Processed cheese and cheese spreads. Fats and oils Butter. Stick margarine. Lard. Shortening. Ghee. Bacon fat. Tropical oils, such as coconut, palm kernel, or palm oil. Seasoning and other foods Salted popcorn and pretzels. Onion salt, garlic salt, seasoned salt, table salt, and sea salt. Worcestershire sauce. Tartar sauce. Barbecue sauce. Teriyaki sauce. Soy sauce, including reduced-sodium. Steak sauce. Canned and packaged gravies. Fish sauce. Oyster sauce. Cocktail sauce. Horseradish that you find on the shelf. Ketchup. Mustard. Meat flavorings and tenderizers. Bouillon cubes. Hot sauce and Tabasco sauce. Premade or packaged marinades. Premade or packaged taco seasonings. Relishes. Regular salad dressings. Where to find more information:  National Heart, Lung, and Blood Institute: www.nhlbi.nih.gov  American Heart Association: www.heart.org Summary  The DASH eating plan is a healthy eating plan that has been shown to reduce high blood pressure (hypertension). It may also reduce your risk for type 2 diabetes, heart disease, and stroke.  With the DASH eating plan, you should limit salt (sodium) intake to 2,300 mg a day. If you have hypertension, you may need to reduce your sodium intake to 1,500 mg a day.  When on the DASH eating plan, aim to eat more fresh fruits and vegetables, whole grains, lean proteins, low-fat dairy, and heart-healthy fats.  Work with your health care provider or diet and nutrition specialist (dietitian) to adjust your eating plan to your individual   calorie needs. This information is not intended to replace advice given to you by your health care provider. Make sure you discuss any questions you have with your health care provider. Document Released: 08/24/2011 Document Revised: 08/28/2016 Document Reviewed: 08/28/2016 Elsevier Interactive Patient Education  2018 Elsevier Inc.  

## 2018-07-22 NOTE — Assessment & Plan Note (Signed)
Poorly controlled will alter medications, encouraged DASH diet, minimize caffeine and obtain adequate sleep. Report concerning symptoms and follow up as directed and as needed 

## 2018-07-23 LAB — COMPREHENSIVE METABOLIC PANEL
ALT: 11 U/L (ref 0–35)
AST: 16 U/L (ref 0–37)
Albumin: 4 g/dL (ref 3.5–5.2)
Alkaline Phosphatase: 69 U/L (ref 39–117)
BILIRUBIN TOTAL: 0.4 mg/dL (ref 0.2–1.2)
BUN: 17 mg/dL (ref 6–23)
CO2: 31 meq/L (ref 19–32)
CREATININE: 1.23 mg/dL — AB (ref 0.40–1.20)
Calcium: 9 mg/dL (ref 8.4–10.5)
Chloride: 102 mEq/L (ref 96–112)
GFR: 55.4 mL/min — ABNORMAL LOW (ref >60.00)
Glucose, Bld: 94 mg/dL (ref 70–99)
Potassium: 3.9 mEq/L (ref 3.5–5.1)
SODIUM: 138 meq/L (ref 135–145)
Total Protein: 7 g/dL (ref 6.0–8.3)

## 2018-07-23 LAB — CBC WITH DIFFERENTIAL/PLATELET
Basophils Absolute: 0.1 10*3/uL (ref 0.0–0.1)
Basophils Relative: 1.1 % (ref 0.0–3.0)
EOS ABS: 0.4 10*3/uL (ref 0.0–0.7)
Eosinophils Relative: 5.9 % — ABNORMAL HIGH (ref 0.0–5.0)
HEMATOCRIT: 34.7 % — AB (ref 36.0–46.0)
Hemoglobin: 11.2 g/dL — ABNORMAL LOW (ref 12.0–15.0)
LYMPHS PCT: 27.4 % (ref 12.0–46.0)
Lymphs Abs: 1.7 10*3/uL (ref 0.7–4.0)
MCHC: 32.3 g/dL (ref 30.0–36.0)
MCV: 84.7 fl (ref 78.0–100.0)
MONOS PCT: 9.1 % (ref 3.0–12.0)
Monocytes Absolute: 0.6 10*3/uL (ref 0.1–1.0)
NEUTROS ABS: 3.4 10*3/uL (ref 1.4–7.7)
Neutrophils Relative %: 56.5 % (ref 43.0–77.0)
PLATELETS: 271 10*3/uL (ref 150.0–400.0)
RBC: 4.09 Mil/uL (ref 3.87–5.11)
RDW: 14 % (ref 11.5–15.5)
WBC: 6.1 10*3/uL (ref 4.0–10.5)

## 2018-07-23 LAB — LIPID PANEL
CHOL/HDL RATIO: 4
Cholesterol: 200 mg/dL (ref 0–200)
HDL: 50.5 mg/dL (ref >39.00)
LDL Cholesterol: 128 mg/dL — ABNORMAL HIGH (ref 0–99)
NonHDL: 149.43
Triglycerides: 108 mg/dL (ref 0.0–149.0)
VLDL: 21.6 mg/dL (ref 0.0–40.0)

## 2018-07-25 ENCOUNTER — Other Ambulatory Visit: Payer: Self-pay

## 2018-07-25 NOTE — Patient Outreach (Signed)
[facility]) Care Management  07/25/2018  Wendy Christensen 1946-09-09 [id number]   Medication Adherence call to [name] left a message for patient to call back patient is due on Olmesartan 40 mg under [place].    [facility] Management Direct Dial [phone]  Fax [phone] [email or web address]

## 2018-07-25 NOTE — Telephone Encounter (Signed)
I attempted to return her call.  I was informed that she was not home.  I asked the gentleman to let her know I was returning her call.

## 2018-07-26 NOTE — Telephone Encounter (Signed)
"  I am scheduled with surgery with Dr. Cannon Kettle.  I want to know a little bit more about the surgery and what time and all that. Could you give me a call?"  I attempted to call the patient again.  I was informed by her husband that she was not home and that the best time to reach her is before 5 pm.  I tried to call her mobile and I got her voicemail.  I asked her to call me tomorrow.

## 2018-07-31 ENCOUNTER — Other Ambulatory Visit: Payer: Self-pay | Admitting: *Deleted

## 2018-07-31 DIAGNOSIS — I10 Essential (primary) hypertension: Secondary | ICD-10-CM

## 2018-07-31 DIAGNOSIS — E785 Hyperlipidemia, unspecified: Secondary | ICD-10-CM

## 2018-07-31 MED ORDER — ROSUVASTATIN CALCIUM 20 MG PO TABS: 20.0000 mg | ORAL_TABLET | Freq: Every day | ORAL | 2 refills | Status: DC

## 2018-08-01 ENCOUNTER — Telehealth: Payer: Self-pay | Admitting: *Deleted

## 2018-08-01 NOTE — Telephone Encounter (Signed)
Pt states that she was asked by [facility] to have form, EKG, and Echos from recent history sent in.

## 2018-08-01 NOTE — Telephone Encounter (Signed)
Received Medical/Surgical Clearance Form from [facility] for Cardiac clearance; forwarded to provider with requested notes/SLS 11/14

## 2018-08-01 NOTE — Telephone Encounter (Signed)
"  I am calling to get a little bit more information about my procedure."  You have a tendon tear, correct?  "Yes, I do."  So what she will be doing is repairing the tear.  She will be putting a little anchor in to hold the tendon intact.  You will be wearing a boot afterwards.  "I already have that and I've been wearing it."  Make sure you take the boot with you when you go for the surgery.  "How long will I be out of work?"  You could be out six to eight weeks.  "That long, I'll have to be out the whole time?"  If you stand on your job, yes you will need to be out of work that long.  If you can sit you can go back to work after a week as long as you can sit and elevate your foot.

## 2018-08-06 ENCOUNTER — Encounter: Payer: Self-pay | Admitting: Sports Medicine

## 2018-08-06 ENCOUNTER — Ambulatory Visit: Payer: Self-pay | Admitting: Sports Medicine

## 2018-08-06 DIAGNOSIS — G8929 Other chronic pain: Secondary | ICD-10-CM

## 2018-08-06 DIAGNOSIS — M79671 Pain in right foot: Secondary | ICD-10-CM

## 2018-08-06 DIAGNOSIS — S86911D Strain of unspecified muscle(s) and tendon(s) at lower leg level, right leg, subsequent encounter: Secondary | ICD-10-CM

## 2018-08-06 NOTE — Progress Notes (Signed)
Patient presented to office today with a few questions prior to her surgery which is scheduled for 11/25. I reviewed the procedure of then repair on right and advised patient that she will NWB for minimum of 3 weeks and then slowly progress to Partial weightbearing and then to full with PT at week 6. Rx Knee scooter. Husband present at today's visit. All questions answered and will proceed as scheduled for surgery on Monday. -Dr. Cannon Kettle

## 2018-08-07 ENCOUNTER — Telehealth: Payer: Self-pay | Admitting: *Deleted

## 2018-08-07 ENCOUNTER — Other Ambulatory Visit: Payer: Self-pay | Admitting: Family Medicine

## 2018-08-07 DIAGNOSIS — S86911D Strain of unspecified muscle(s) and tendon(s) at lower leg level, right leg, subsequent encounter: Secondary | ICD-10-CM

## 2018-08-07 DIAGNOSIS — G8929 Other chronic pain: Secondary | ICD-10-CM

## 2018-08-07 DIAGNOSIS — M79671 Pain in right foot: Secondary | ICD-10-CM

## 2018-08-07 NOTE — Telephone Encounter (Signed)
-----   Message from Landis Martins, [place] sent at 08/06/2018  2:48 PM EST ----- Regarding: Knee Scooter Surgery on Monday 11-25

## 2018-08-07 NOTE — Telephone Encounter (Signed)
Faxed orders to [facility] and emailed to A. Barnet [place].

## 2018-08-12 ENCOUNTER — Encounter: Payer: Self-pay | Admitting: Sports Medicine

## 2018-08-12 ENCOUNTER — Other Ambulatory Visit: Payer: Self-pay | Admitting: Sports Medicine

## 2018-08-12 ENCOUNTER — Telehealth: Payer: Self-pay | Admitting: *Deleted

## 2018-08-12 DIAGNOSIS — S96911A Strain of unspecified muscle and tendon at ankle and foot level, right foot, initial encounter: Secondary | ICD-10-CM | POA: Diagnosis not present

## 2018-08-12 DIAGNOSIS — M25571 Pain in right ankle and joints of right foot: Secondary | ICD-10-CM | POA: Diagnosis not present

## 2018-08-12 DIAGNOSIS — S86311A Strain of muscle(s) and tendon(s) of peroneal muscle group at lower leg level, right leg, initial encounter: Secondary | ICD-10-CM | POA: Diagnosis not present

## 2018-08-12 DIAGNOSIS — I10 Essential (primary) hypertension: Secondary | ICD-10-CM | POA: Diagnosis not present

## 2018-08-12 DIAGNOSIS — G8918 Other acute postprocedural pain: Secondary | ICD-10-CM

## 2018-08-12 DIAGNOSIS — S99911D Unspecified injury of right ankle, subsequent encounter: Secondary | ICD-10-CM | POA: Diagnosis not present

## 2018-08-12 DIAGNOSIS — S86911D Strain of unspecified muscle(s) and tendon(s) at lower leg level, right leg, subsequent encounter: Secondary | ICD-10-CM | POA: Diagnosis not present

## 2018-08-12 MED ORDER — DOCUSATE SODIUM 100 MG PO CAPS: 100.0000 mg | ORAL_CAPSULE | Freq: Two times a day (BID) | ORAL | 0 refills | Status: DC

## 2018-08-12 MED ORDER — PROMETHAZINE HCL 25 MG PO TABS: 25.0000 mg | ORAL_TABLET | Freq: Three times a day (TID) | ORAL | 0 refills | Status: DC | PRN

## 2018-08-12 MED ORDER — IBUPROFEN 800 MG PO TABS: 800.0000 mg | ORAL_TABLET | Freq: Three times a day (TID) | ORAL | 0 refills | Status: DC | PRN

## 2018-08-12 MED ORDER — HYDROCODONE-ACETAMINOPHEN 10-325 MG PO TABS: 1.0000 | ORAL_TABLET | Freq: Four times a day (QID) | ORAL | 0 refills | Status: DC | PRN

## 2018-08-12 NOTE — Telephone Encounter (Signed)
-----   Message from Landis Martins, [place] sent at 08/12/2018 12:08 PM EST ----- Regarding: Knee Scooter  Patient still hasn't gotten a phone call about Knee Scooter from [facility]. Patient is having surgery today and will need the scooter to help remain nonweightbearing on the right. Thanks Dr. Cannon Kettle

## 2018-08-12 NOTE — Progress Notes (Signed)
Sent Post op meds to pharmacy

## 2018-08-12 NOTE — Telephone Encounter (Signed)
[facility] informing that pt left message to contact pt for the knee scooter ordered on 08/07/2018.

## 2018-08-12 NOTE — Telephone Encounter (Signed)
Emailed request for status of Knee Scooter order of 08/07/2018 to A. Barnet Glasgow.

## 2018-08-13 ENCOUNTER — Telehealth: Payer: Self-pay | Admitting: Sports Medicine

## 2018-08-13 DIAGNOSIS — S86911D Strain of unspecified muscle(s) and tendon(s) at lower leg level, right leg, subsequent encounter: Secondary | ICD-10-CM | POA: Diagnosis not present

## 2018-08-13 DIAGNOSIS — M79671 Pain in right foot: Secondary | ICD-10-CM | POA: Diagnosis not present

## 2018-08-13 NOTE — Telephone Encounter (Signed)
Post op check phone call made to patient at home and cell phone #. Patient did not answer. Left voicemail on cell phone # advising patient to call office if she has any post op questions or concerns. -Dr. Cannon Kettle

## 2018-08-20 ENCOUNTER — Encounter: Payer: Self-pay | Admitting: Sports Medicine

## 2018-08-20 ENCOUNTER — Ambulatory Visit: Payer: Medicare Other

## 2018-08-20 ENCOUNTER — Ambulatory Visit (INDEPENDENT_AMBULATORY_CARE_PROVIDER_SITE_OTHER): Payer: Self-pay | Admitting: Sports Medicine

## 2018-08-20 DIAGNOSIS — S86911D Strain of unspecified muscle(s) and tendon(s) at lower leg level, right leg, subsequent encounter: Secondary | ICD-10-CM

## 2018-08-20 DIAGNOSIS — G8918 Other acute postprocedural pain: Secondary | ICD-10-CM

## 2018-08-20 DIAGNOSIS — Z9889 Other specified postprocedural states: Secondary | ICD-10-CM

## 2018-08-20 NOTE — Progress Notes (Signed)
Subjective: Wendy Christensen is a 72 y.o. female patient seen today in office for POV #1 (DOS 08/12/2018), S/P right peroneal tendon repair with graft.  Patient denies pain at surgical site and reports that the only issue she is having is sleeping with the boot, denies calf pain, denies headache, chest pain, shortness of breath, nausea, vomiting, fever, or chills. Patient states that she is doing well and is only taking Ibuprofen and only took about 2-3 doses of the pain medicine. No other issues noted.   Patient Active Problem List   Diagnosis Date Noted  . Preop examination 07/22/2018  . Tendon tear, ankle, right, initial encounter 07/16/2018  . Candidiasis of vagina 04/23/2018  . Uterine prolapse 04/23/2018  . Mass of right axilla 04/18/2018  . Chest pain 05/25/2017  . History of colon polyps 05/25/2017  . Hypertension 07/13/2015  . Renal insufficiency 03/30/2015  . Pain in the abdomen 02/24/2015  . Intrinsic asthma 01/26/2015  . Hyperglycemia 06/19/2013  . Hypothyroidism 06/17/2013  . OSA (obstructive sleep apnea) 01/14/2013  . BACK PAIN, THORACIC REGION, LEFT 11/18/2010  . ANXIETY DEPRESSION 05/20/2009  . SKIN TAG 04/19/2009  . POSTMENOPAUSAL STATUS 12/28/2008  . SINUSITIS- ACUTE-NOS 09/09/2008  . ANXIETY 09/01/2008  . GENERALIZED ANXIETY DISORDER 06/30/2008  . INSOMNIA 04/24/2008  . GOITER NOS 06/04/2007  . NUMBNESS, ARM 06/04/2007  . WEIGHT LOSS 06/04/2007  . Hyperlipidemia LDL goal <100 02/07/2007  . Essential hypertension 02/07/2007  . ALLERGIC RHINITIS 02/07/2007  . GERD 02/07/2007  . COLONOSCOPY, HX OF 02/07/2007  . DILATION AND CURETTAGE, HX OF 02/07/2007    Current Outpatient Medications on File Prior to Visit  Medication Sig Dispense Refill  . albuterol (PROAIR HFA) 108 (90 Base) MCG/ACT inhaler INHALE 2 PUFFS INTO THE LUNGS EVERY 4 HOURS AS NEEDED FOR WHEEZING 8.5 g 3  . atorvastatin (LIPITOR) 40 MG tablet Take 1 tablet (40 mg total) by mouth daily. 90  tablet 1  . azelastine (OPTIVAR) 0.05 % ophthalmic solution     . citalopram (CELEXA) 40 MG tablet TAKE 1 TABLET BY MOUTH EVERY MORNING 90 tablet 0  . docusate sodium (COLACE) 100 MG capsule Take 1 capsule (100 mg total) by mouth 2 (two) times daily. 10 capsule 0  . fenofibrate 160 MG tablet Take 1 tablet (160 mg total) by mouth daily. 90 tablet 1  . FLOVENT HFA 110 MCG/ACT inhaler Inhale 2 puffs into the lungs daily as needed. 1 Inhaler 3  . fluticasone (FLONASE) 50 MCG/ACT nasal spray Place 2 sprays into both nostrils as needed.  3  . gentamicin (GARAMYCIN) 0.3 % ophthalmic solution Reported on 09/24/2015  0  . hydrochlorothiazide (HYDRODIURIL) 25 MG tablet Take 1 tablet (25 mg total) by mouth daily. 1 po qd prn 90 tablet 1  . HYDROcodone-acetaminophen (NORCO) 10-325 MG tablet Take 1 tablet by mouth every 6 (six) hours as needed. 20 tablet 0  . ibuprofen (ADVIL,MOTRIN) 800 MG tablet Take 1 tablet (800 mg total) by mouth every 8 (eight) hours as needed. 30 tablet 0  . meloxicam (MOBIC) 15 MG tablet TAKE 1 TABLET BY MOUTH DAILY 90 tablet 0  . metoprolol (LOPRESSOR) 100 MG tablet Take 1 tablet (100 mg total) by mouth 2 (two) times daily. 180 tablet 1  . metoprolol tartrate (LOPRESSOR) 100 MG tablet TAKE 1 TABLET BY MOUTH TWICE DAILY 180 tablet 0  . montelukast (SINGULAIR) 10 MG tablet Take 1 tablet (10 mg total) by mouth at bedtime. 90 tablet 3  . Multiple Vitamins-Minerals (  MULTIVITAMIN ADULT PO) multivitamin    . Multiple Vitamins-Minerals (MULTIVITAMIN WITH MINERALS) tablet Take 1 tablet by mouth daily.    Marland Kitchen olmesartan (BENICAR) 40 MG tablet TAKE 1 TABLET BY MOUTH DAILY 90 tablet 1  . omeprazole (PRILOSEC) 20 MG capsule TAKE ONE CAPSULE BY MOUTH DAILY 90 capsule 0  . promethazine (PHENERGAN) 25 MG tablet Take 1 tablet (25 mg total) by mouth every 8 (eight) hours as needed for nausea or vomiting. 20 tablet 0  . Propylene Glycol-Glycerin (MOISTURE EYES OP) Apply to eye.    . rosuvastatin (CRESTOR)  20 MG tablet Take 1 tablet (20 mg total) by mouth daily. 30 tablet 2  . vitamin B-12 (CYANOCOBALAMIN) 1000 MCG tablet Take 1,000 mcg by mouth daily.     No current facility-administered medications on file prior to visit.     No Known Allergies  Objective: There were no vitals filed for this visit.  General: No acute distress, AAOx3  Right foot: Sutures intact with no gapping or dehiscence at surgical site, mild swelling to right lateral foot and ankle, no erythema, no warmth, no drainage, no signs of infection noted, Capillary fill time <3 seconds in all digits, gross sensation present via light touch to right foot. No pain or crepitation with range of motion right foot.  No pain with calf compression.   Assessment and Plan:  Problem List Items Addressed This Visit    None    Visit Diagnoses    Postoperative state    -  Primary   Lower extremity tendon tear, right, subsequent encounter       Pain following surgery or procedure           -Patient seen and evaluated -Applied soft cast to right foot covered with ace wrap and advised patient to keep this dressing clean dry and intact may use the soft cast instead of sleeping with the boot since it is uncomfortable for her however I did advise patient to continue with the boot when she is up moving around to protect her foot and to continue with her walker knee scooter or crutches to help keep pressure off the surgical site -Advised patient to ice and elevate as necessary  -Will plan for possible suture removal at next office visit. In the meantime, patient to call office if any issues or problems arise.   Landis Martins, DPM

## 2018-08-26 ENCOUNTER — Other Ambulatory Visit: Payer: Self-pay

## 2018-08-26 NOTE — Patient Outreach (Signed)
[facility]) Care Management  08/26/2018  Wendy Christensen 1945/12/17 [id number]   Medication Adherence call to [name] spoke with patient she is due on Olmesartan 40 mg she ask if we can call [facility] an order this medication. [facility] will fill and have it ready. [name] is showing past due under [place].   [place] Management Direct Dial [phone]  Fax [phone] [email or web address]

## 2018-08-27 ENCOUNTER — Encounter: Payer: Self-pay | Admitting: Sports Medicine

## 2018-08-27 ENCOUNTER — Ambulatory Visit (INDEPENDENT_AMBULATORY_CARE_PROVIDER_SITE_OTHER): Payer: Self-pay | Admitting: Sports Medicine

## 2018-08-27 DIAGNOSIS — S86911D Strain of unspecified muscle(s) and tendon(s) at lower leg level, right leg, subsequent encounter: Secondary | ICD-10-CM

## 2018-08-27 DIAGNOSIS — G8918 Other acute postprocedural pain: Secondary | ICD-10-CM

## 2018-08-27 DIAGNOSIS — Z9889 Other specified postprocedural states: Secondary | ICD-10-CM

## 2018-08-27 MED ORDER — IBUPROFEN 800 MG PO TABS: 800.0000 mg | ORAL_TABLET | Freq: Three times a day (TID) | ORAL | 0 refills | Status: DC | PRN

## 2018-08-27 NOTE — Progress Notes (Signed)
Subjective: Wendy Christensen is a 72 y.o. female patient seen today in office for POV #2 (DOS 08/12/2018), S/P right peroneal tendon repair with graft.  Patient denies pain at surgical site and reports that she is doing well and little bit of itching to her right leg and takes ibuprofen at night denies calf pain, denies headache, chest pain, shortness of breath, nausea, vomiting, fever, or chills. No other issues noted.   Patient Active Problem List   Diagnosis Date Noted  . Preop examination 07/22/2018  . Tendon tear, ankle, right, initial encounter 07/16/2018  . Candidiasis of vagina 04/23/2018  . Uterine prolapse 04/23/2018  . Mass of right axilla 04/18/2018  . Chest pain 05/25/2017  . History of colon polyps 05/25/2017  . Hypertension 07/13/2015  . Renal insufficiency 03/30/2015  . Pain in the abdomen 02/24/2015  . Intrinsic asthma 01/26/2015  . Hyperglycemia 06/19/2013  . Hypothyroidism 06/17/2013  . OSA (obstructive sleep apnea) 01/14/2013  . BACK PAIN, THORACIC REGION, LEFT 11/18/2010  . ANXIETY DEPRESSION 05/20/2009  . SKIN TAG 04/19/2009  . POSTMENOPAUSAL STATUS 12/28/2008  . SINUSITIS- ACUTE-NOS 09/09/2008  . ANXIETY 09/01/2008  . GENERALIZED ANXIETY DISORDER 06/30/2008  . INSOMNIA 04/24/2008  . GOITER NOS 06/04/2007  . NUMBNESS, ARM 06/04/2007  . WEIGHT LOSS 06/04/2007  . Hyperlipidemia LDL goal <100 02/07/2007  . Essential hypertension 02/07/2007  . ALLERGIC RHINITIS 02/07/2007  . GERD 02/07/2007  . COLONOSCOPY, HX OF 02/07/2007  . DILATION AND CURETTAGE, HX OF 02/07/2007    Current Outpatient Medications on File Prior to Visit  Medication Sig Dispense Refill  . albuterol (PROAIR HFA) 108 (90 Base) MCG/ACT inhaler INHALE 2 PUFFS INTO THE LUNGS EVERY 4 HOURS AS NEEDED FOR WHEEZING 8.5 g 3  . atorvastatin (LIPITOR) 40 MG tablet Take 1 tablet (40 mg total) by mouth daily. 90 tablet 1  . azelastine (OPTIVAR) 0.05 % ophthalmic solution     . citalopram  (CELEXA) 40 MG tablet TAKE 1 TABLET BY MOUTH EVERY MORNING 90 tablet 0  . docusate sodium (COLACE) 100 MG capsule Take 1 capsule (100 mg total) by mouth 2 (two) times daily. 10 capsule 0  . fenofibrate 160 MG tablet Take 1 tablet (160 mg total) by mouth daily. 90 tablet 1  . FLOVENT HFA 110 MCG/ACT inhaler Inhale 2 puffs into the lungs daily as needed. 1 Inhaler 3  . fluticasone (FLONASE) 50 MCG/ACT nasal spray Place 2 sprays into both nostrils as needed.  3  . gentamicin (GARAMYCIN) 0.3 % ophthalmic solution Reported on 09/24/2015  0  . hydrochlorothiazide (HYDRODIURIL) 25 MG tablet Take 1 tablet (25 mg total) by mouth daily. 1 po qd prn 90 tablet 1  . HYDROcodone-acetaminophen (NORCO) 10-325 MG tablet Take 1 tablet by mouth every 6 (six) hours as needed. 20 tablet 0  . meloxicam (MOBIC) 15 MG tablet TAKE 1 TABLET BY MOUTH DAILY 90 tablet 0  . metoprolol (LOPRESSOR) 100 MG tablet Take 1 tablet (100 mg total) by mouth 2 (two) times daily. 180 tablet 1  . metoprolol tartrate (LOPRESSOR) 100 MG tablet TAKE 1 TABLET BY MOUTH TWICE DAILY 180 tablet 0  . montelukast (SINGULAIR) 10 MG tablet Take 1 tablet (10 mg total) by mouth at bedtime. 90 tablet 3  . Multiple Vitamins-Minerals (MULTIVITAMIN ADULT PO) multivitamin    . Multiple Vitamins-Minerals (MULTIVITAMIN WITH MINERALS) tablet Take 1 tablet by mouth daily.    Marland Kitchen olmesartan (BENICAR) 40 MG tablet TAKE 1 TABLET BY MOUTH DAILY 90 tablet 1  .  omeprazole (PRILOSEC) 20 MG capsule TAKE ONE CAPSULE BY MOUTH DAILY 90 capsule 0  . promethazine (PHENERGAN) 25 MG tablet Take 1 tablet (25 mg total) by mouth every 8 (eight) hours as needed for nausea or vomiting. 20 tablet 0  . Propylene Glycol-Glycerin (MOISTURE EYES OP) Apply to eye.    . rosuvastatin (CRESTOR) 20 MG tablet Take 1 tablet (20 mg total) by mouth daily. 30 tablet 2  . vitamin B-12 (CYANOCOBALAMIN) 1000 MCG tablet Take 1,000 mcg by mouth daily.     No current facility-administered medications on  file prior to visit.     No Known Allergies  Objective: There were no vitals filed for this visit.  General: No acute distress, AAOx3  Right foot: Sutures intact with MILD gapping CENTRALLY BUT NO dehiscence at surgical site, mild swelling to right lateral foot and ankle, no erythema, no warmth, no drainage, no signs of infection noted, Capillary fill time <3 seconds in all digits, gross sensation present via light touch to right foot. No pain or crepitation with range of motion right foot.  No pain with calf compression.   Assessment and Plan:  Problem List Items Addressed This Visit    None    Visit Diagnoses    Postoperative state    -  Primary   Lower extremity tendon tear, right, subsequent encounter       Relevant Medications   ibuprofen (ADVIL,MOTRIN) 800 MG tablet   Pain following surgery or procedure       Relevant Medications   ibuprofen (ADVIL,MOTRIN) 800 MG tablet       -Patient seen and evaluated -Applied dry dressing to keep intact with ACE wrap until next visit -Continue with walker knee scooter or crutches to help keep pressure off the surgical site and CAM bot -Advised patient to ice and elevate as necessary  -Refilled motrin  -Will plan for suture removal at next office visit since they were not ready to come out at today's visit. In the meantime, patient to call office if any issues or problems arise.   Landis Martins, DPM

## 2018-08-29 NOTE — Progress Notes (Signed)
DOS  08/12/2018  Repair of tendon (peroneal longus) right foot.

## 2018-09-03 ENCOUNTER — Ambulatory Visit (INDEPENDENT_AMBULATORY_CARE_PROVIDER_SITE_OTHER): Payer: Self-pay | Admitting: Sports Medicine

## 2018-09-03 ENCOUNTER — Encounter: Payer: Self-pay | Admitting: Sports Medicine

## 2018-09-03 DIAGNOSIS — Z9889 Other specified postprocedural states: Secondary | ICD-10-CM

## 2018-09-03 DIAGNOSIS — M779 Enthesopathy, unspecified: Secondary | ICD-10-CM

## 2018-09-03 NOTE — Progress Notes (Signed)
Subjective: Wendy Christensen is a 72 y.o. female patient seen today in office for POV #3 (DOS 08/12/2018), S/P right peroneal tendon repair with graft.  Patient denies pain at surgical site and reports that she is doing well but feels like the boot is rubbing, denies calf pain, denies headache, chest pain, shortness of breath, nausea, vomiting, fever, or chills. No other issues noted.   Patient Active Problem List   Diagnosis Date Noted  . Preop examination 07/22/2018  . Tendon tear, ankle, right, initial encounter 07/16/2018  . Candidiasis of vagina 04/23/2018  . Uterine prolapse 04/23/2018  . Mass of right axilla 04/18/2018  . Chest pain 05/25/2017  . History of colon polyps 05/25/2017  . Hypertension 07/13/2015  . Renal insufficiency 03/30/2015  . Pain in the abdomen 02/24/2015  . Intrinsic asthma 01/26/2015  . Hyperglycemia 06/19/2013  . Hypothyroidism 06/17/2013  . OSA (obstructive sleep apnea) 01/14/2013  . BACK PAIN, THORACIC REGION, LEFT 11/18/2010  . ANXIETY DEPRESSION 05/20/2009  . SKIN TAG 04/19/2009  . POSTMENOPAUSAL STATUS 12/28/2008  . SINUSITIS- ACUTE-NOS 09/09/2008  . ANXIETY 09/01/2008  . GENERALIZED ANXIETY DISORDER 06/30/2008  . INSOMNIA 04/24/2008  . GOITER NOS 06/04/2007  . NUMBNESS, ARM 06/04/2007  . WEIGHT LOSS 06/04/2007  . Hyperlipidemia LDL goal <100 02/07/2007  . Essential hypertension 02/07/2007  . ALLERGIC RHINITIS 02/07/2007  . GERD 02/07/2007  . COLONOSCOPY, HX OF 02/07/2007  . DILATION AND CURETTAGE, HX OF 02/07/2007    Current Outpatient Medications on File Prior to Visit  Medication Sig Dispense Refill  . albuterol (PROAIR HFA) 108 (90 Base) MCG/ACT inhaler INHALE 2 PUFFS INTO THE LUNGS EVERY 4 HOURS AS NEEDED FOR WHEEZING 8.5 g 3  . atorvastatin (LIPITOR) 40 MG tablet Take 1 tablet (40 mg total) by mouth daily. 90 tablet 1  . azelastine (OPTIVAR) 0.05 % ophthalmic solution     . citalopram (CELEXA) 40 MG tablet TAKE 1 TABLET BY  MOUTH EVERY MORNING 90 tablet 0  . docusate sodium (COLACE) 100 MG capsule Take 1 capsule (100 mg total) by mouth 2 (two) times daily. 10 capsule 0  . fenofibrate 160 MG tablet Take 1 tablet (160 mg total) by mouth daily. 90 tablet 1  . FLOVENT HFA 110 MCG/ACT inhaler Inhale 2 puffs into the lungs daily as needed. 1 Inhaler 3  . fluticasone (FLONASE) 50 MCG/ACT nasal spray Place 2 sprays into both nostrils as needed.  3  . gentamicin (GARAMYCIN) 0.3 % ophthalmic solution Reported on 09/24/2015  0  . hydrochlorothiazide (HYDRODIURIL) 25 MG tablet Take 1 tablet (25 mg total) by mouth daily. 1 po qd prn 90 tablet 1  . HYDROcodone-acetaminophen (NORCO) 10-325 MG tablet Take 1 tablet by mouth every 6 (six) hours as needed. 20 tablet 0  . ibuprofen (ADVIL,MOTRIN) 800 MG tablet Take 1 tablet (800 mg total) by mouth every 8 (eight) hours as needed. 30 tablet 0  . meloxicam (MOBIC) 15 MG tablet TAKE 1 TABLET BY MOUTH DAILY 90 tablet 0  . metoprolol (LOPRESSOR) 100 MG tablet Take 1 tablet (100 mg total) by mouth 2 (two) times daily. 180 tablet 1  . metoprolol tartrate (LOPRESSOR) 100 MG tablet TAKE 1 TABLET BY MOUTH TWICE DAILY 180 tablet 0  . montelukast (SINGULAIR) 10 MG tablet Take 1 tablet (10 mg total) by mouth at bedtime. 90 tablet 3  . Multiple Vitamins-Minerals (MULTIVITAMIN ADULT PO) multivitamin    . Multiple Vitamins-Minerals (MULTIVITAMIN WITH MINERALS) tablet Take 1 tablet by mouth daily.    Marland Kitchen  olmesartan (BENICAR) 40 MG tablet TAKE 1 TABLET BY MOUTH DAILY 90 tablet 1  . omeprazole (PRILOSEC) 20 MG capsule TAKE ONE CAPSULE BY MOUTH DAILY 90 capsule 0  . promethazine (PHENERGAN) 25 MG tablet Take 1 tablet (25 mg total) by mouth every 8 (eight) hours as needed for nausea or vomiting. 20 tablet 0  . Propylene Glycol-Glycerin (MOISTURE EYES OP) Apply to eye.    . rosuvastatin (CRESTOR) 20 MG tablet Take 1 tablet (20 mg total) by mouth daily. 30 tablet 2  . vitamin B-12 (CYANOCOBALAMIN) 1000 MCG  tablet Take 1,000 mcg by mouth daily.     No current facility-administered medications on file prior to visit.     No Known Allergies  Objective: There were no vitals filed for this visit.  General: No acute distress, AAOx3  Right foot: Sutures intact with MILD gapping CENTRALLY BUT NO dehiscence at surgical site, mild swelling to right lateral foot and ankle, no erythema, no warmth, no drainage, no signs of infection noted, Capillary fill time <3 seconds in all digits, gross sensation present via light touch to right foot. No pain or crepitation with range of motion right foot.  No pain with calf compression.   Assessment and Plan:  Problem List Items Addressed This Visit    None    Visit Diagnoses    Postoperative state    -  Primary   Tendonitis           -Patient seen and evaluated -Sutures left in place since there was central gapping -Applied dry dressing to keep intact with ACE wrap until next visit -Continue with walker knee scooter or crutches to help keep pressure off the surgical site and dispensed postop shoe to use instead of Cam boot -Advised patient to ice and elevate as necessary  -Refilled motrin  -Will plan for suture removal at next office visit and allowing patient to full weight-bear with postop shoe at next office visit in the meantime, patient to call office if any issues or problems arise.   Landis Martins, DPM

## 2018-09-12 ENCOUNTER — Ambulatory Visit (INDEPENDENT_AMBULATORY_CARE_PROVIDER_SITE_OTHER): Payer: Medicare Other | Admitting: Podiatry

## 2018-09-12 DIAGNOSIS — Z9889 Other specified postprocedural states: Secondary | ICD-10-CM

## 2018-09-12 DIAGNOSIS — S96911A Strain of unspecified muscle and tendon at ankle and foot level, right foot, initial encounter: Secondary | ICD-10-CM

## 2018-09-12 NOTE — Progress Notes (Signed)
This patient presents to the office for evaluation of her right foot following surgery by Dr. Cannon Kettle surgery was performed on August 12, 2018.  Patient had a repair of the right peroneal tendon with a graft. Patient states that her foot is doing a lot better and there is a lot less pain.   Patient presents the office today wearing a surgical shoe stating that she walks with  light  steps when she walks.  She denies any drainage or discomfort at the surgical sites.  Objective neurovascular status intact no pain noted in the calf of the right leg good wound coaptation both distally and proximally but there still is mild mild separation at the central aspect of the incision.  No evidence of any pus or dehiscence noted.    S/P Right Foot Surgery.  POV 4 weeks post op.  There is minimal swelling redness or pain noted at the surgical site right ankle.  There is still persistent gapping in the center of the incision.  Steri-Strips were then applied across the center incision right ankle.  A light bandage was applied to the right ankle today.  She is to return to the office in 2 weeks for an evaluation with Dr. Cannon Kettle.   Gardiner Barefoot DPM

## 2018-09-26 ENCOUNTER — Other Ambulatory Visit: Payer: Self-pay | Admitting: Family Medicine

## 2018-10-01 ENCOUNTER — Telehealth: Payer: Self-pay | Admitting: *Deleted

## 2018-10-01 ENCOUNTER — Encounter: Payer: Self-pay | Admitting: Sports Medicine

## 2018-10-01 ENCOUNTER — Ambulatory Visit (INDEPENDENT_AMBULATORY_CARE_PROVIDER_SITE_OTHER): Payer: Self-pay | Admitting: Sports Medicine

## 2018-10-01 DIAGNOSIS — M779 Enthesopathy, unspecified: Secondary | ICD-10-CM

## 2018-10-01 DIAGNOSIS — Z9889 Other specified postprocedural states: Secondary | ICD-10-CM

## 2018-10-01 DIAGNOSIS — S96911A Strain of unspecified muscle and tendon at ankle and foot level, right foot, initial encounter: Secondary | ICD-10-CM

## 2018-10-01 DIAGNOSIS — S86911D Strain of unspecified muscle(s) and tendon(s) at lower leg level, right leg, subsequent encounter: Secondary | ICD-10-CM

## 2018-10-01 NOTE — Progress Notes (Signed)
Subjective: Wendy Christensen is a 73 y.o. female patient seen today in office for POV #4 (DOS 08/12/2018), S/P right peroneal tendon repair with graft.  Patient denies pain at surgical site but reports swelling, denies calf pain, denies headache, chest pain, shortness of breath, nausea, vomiting, fever, or chills. No other issues noted.   Patient Active Problem List   Diagnosis Date Noted  . Preop examination 07/22/2018  . Tendon tear, ankle, right, initial encounter 07/16/2018  . Candidiasis of vagina 04/23/2018  . Uterine prolapse 04/23/2018  . Mass of right axilla 04/18/2018  . Chest pain 05/25/2017  . History of colon polyps 05/25/2017  . Hypertension 07/13/2015  . Renal insufficiency 03/30/2015  . Pain in the abdomen 02/24/2015  . Intrinsic asthma 01/26/2015  . Hyperglycemia 06/19/2013  . Hypothyroidism 06/17/2013  . OSA (obstructive sleep apnea) 01/14/2013  . BACK PAIN, THORACIC REGION, LEFT 11/18/2010  . ANXIETY DEPRESSION 05/20/2009  . SKIN TAG 04/19/2009  . POSTMENOPAUSAL STATUS 12/28/2008  . SINUSITIS- ACUTE-NOS 09/09/2008  . ANXIETY 09/01/2008  . GENERALIZED ANXIETY DISORDER 06/30/2008  . INSOMNIA 04/24/2008  . GOITER NOS 06/04/2007  . NUMBNESS, ARM 06/04/2007  . WEIGHT LOSS 06/04/2007  . Hyperlipidemia LDL goal <100 02/07/2007  . Essential hypertension 02/07/2007  . ALLERGIC RHINITIS 02/07/2007  . GERD 02/07/2007  . COLONOSCOPY, HX OF 02/07/2007  . DILATION AND CURETTAGE, HX OF 02/07/2007    Current Outpatient Medications on File Prior to Visit  Medication Sig Dispense Refill  . albuterol (PROAIR HFA) 108 (90 Base) MCG/ACT inhaler INHALE 2 PUFFS INTO THE LUNGS EVERY 4 HOURS AS NEEDED FOR WHEEZING 8.5 g 3  . atorvastatin (LIPITOR) 40 MG tablet Take 1 tablet (40 mg total) by mouth daily. 90 tablet 1  . azelastine (OPTIVAR) 0.05 % ophthalmic solution     . citalopram (CELEXA) 40 MG tablet TAKE 1 TABLET BY MOUTH EVERY MORNING 90 tablet 0  . docusate  sodium (COLACE) 100 MG capsule Take 1 capsule (100 mg total) by mouth 2 (two) times daily. 10 capsule 0  . fenofibrate 160 MG tablet Take 1 tablet (160 mg total) by mouth daily. 90 tablet 1  . FLOVENT HFA 110 MCG/ACT inhaler Inhale 2 puffs into the lungs daily as needed. 1 Inhaler 3  . fluticasone (FLONASE) 50 MCG/ACT nasal spray Place 2 sprays into both nostrils as needed.  3  . gentamicin (GARAMYCIN) 0.3 % ophthalmic solution Reported on 09/24/2015  0  . hydrochlorothiazide (HYDRODIURIL) 25 MG tablet Take 1 tablet (25 mg total) by mouth daily. 1 po qd prn 90 tablet 1  . HYDROcodone-acetaminophen (NORCO) 10-325 MG tablet Take 1 tablet by mouth every 6 (six) hours as needed. 20 tablet 0  . ibuprofen (ADVIL,MOTRIN) 800 MG tablet Take 1 tablet (800 mg total) by mouth every 8 (eight) hours as needed. 30 tablet 0  . meloxicam (MOBIC) 15 MG tablet TAKE 1 TABLET BY MOUTH DAILY 90 tablet 0  . metoprolol tartrate (LOPRESSOR) 100 MG tablet TAKE 1 TABLET BY MOUTH TWICE DAILY 180 tablet 1  . montelukast (SINGULAIR) 10 MG tablet Take 1 tablet (10 mg total) by mouth at bedtime. 90 tablet 3  . Multiple Vitamins-Minerals (MULTIVITAMIN ADULT PO) multivitamin    . Multiple Vitamins-Minerals (MULTIVITAMIN WITH MINERALS) tablet Take 1 tablet by mouth daily.    Marland Kitchen olmesartan (BENICAR) 40 MG tablet TAKE 1 TABLET BY MOUTH DAILY 90 tablet 1  . omeprazole (PRILOSEC) 20 MG capsule TAKE 1 CAPSULE BY MOUTH ONCE DAILY 90  capsule 1  . promethazine (PHENERGAN) 25 MG tablet Take 1 tablet (25 mg total) by mouth every 8 (eight) hours as needed for nausea or vomiting. 20 tablet 0  . Propylene Glycol-Glycerin (MOISTURE EYES OP) Apply to eye.    . rosuvastatin (CRESTOR) 20 MG tablet Take 1 tablet (20 mg total) by mouth daily. 30 tablet 2  . vitamin B-12 (CYANOCOBALAMIN) 1000 MCG tablet Take 1,000 mcg by mouth daily.     No current facility-administered medications on file prior to visit.     No Known Allergies  Objective: There  were no vitals filed for this visit.  General: No acute distress, AAOx3  Right foot: Incision site well healed, mild swelling to right lateral foot and ankle, no erythema, no warmth, no drainage, no signs of infection noted, Capillary fill time <3 seconds in all digits, gross sensation present via light touch to right foot. No pain or crepitation with range of motion right foot.  No pain with calf compression.   Assessment and Plan:  Problem List Items Addressed This Visit      Musculoskeletal and Integument   Tendon tear, ankle, right, initial encounter - Primary    Other Visit Diagnoses    Postoperative state           -Patient seen and evaluated -Rx PT -Dispensed compression sleeve to assist with edema control -Advised patient may transition to normal shoe that will not rub surgical site  -Continue with rest, ice, elevation as needed -Return in 1 month for follow up after starting PT. Advised patient to call office if any issues or problems arise.   Landis Martins, DPM

## 2018-10-01 NOTE — Telephone Encounter (Signed)
-----   Message from Landis Martins, [place] sent at 10/01/2018 11:09 AM EST ----- Regarding: PT Order PT for edema control, gait, range of motion, strengthening s/p R peroneal tendon repair 08/12/18

## 2018-10-01 NOTE — Telephone Encounter (Signed)
Hand delivered PT rx to [facility] - In-office.

## 2018-10-07 DIAGNOSIS — R269 Unspecified abnormalities of gait and mobility: Secondary | ICD-10-CM | POA: Diagnosis not present

## 2018-10-07 DIAGNOSIS — M62561 Muscle wasting and atrophy, not elsewhere classified, right lower leg: Secondary | ICD-10-CM | POA: Diagnosis not present

## 2018-10-07 DIAGNOSIS — M25671 Stiffness of right ankle, not elsewhere classified: Secondary | ICD-10-CM | POA: Diagnosis not present

## 2018-10-07 DIAGNOSIS — M25471 Effusion, right ankle: Secondary | ICD-10-CM | POA: Diagnosis not present

## 2018-10-09 DIAGNOSIS — M25471 Effusion, right ankle: Secondary | ICD-10-CM | POA: Diagnosis not present

## 2018-10-09 DIAGNOSIS — R269 Unspecified abnormalities of gait and mobility: Secondary | ICD-10-CM | POA: Diagnosis not present

## 2018-10-09 DIAGNOSIS — M62561 Muscle wasting and atrophy, not elsewhere classified, right lower leg: Secondary | ICD-10-CM | POA: Diagnosis not present

## 2018-10-09 DIAGNOSIS — M25671 Stiffness of right ankle, not elsewhere classified: Secondary | ICD-10-CM | POA: Diagnosis not present

## 2018-10-14 DIAGNOSIS — M62561 Muscle wasting and atrophy, not elsewhere classified, right lower leg: Secondary | ICD-10-CM | POA: Diagnosis not present

## 2018-10-14 DIAGNOSIS — M25671 Stiffness of right ankle, not elsewhere classified: Secondary | ICD-10-CM | POA: Diagnosis not present

## 2018-10-14 DIAGNOSIS — R269 Unspecified abnormalities of gait and mobility: Secondary | ICD-10-CM | POA: Diagnosis not present

## 2018-10-14 DIAGNOSIS — M25471 Effusion, right ankle: Secondary | ICD-10-CM | POA: Diagnosis not present

## 2018-10-16 DIAGNOSIS — M25671 Stiffness of right ankle, not elsewhere classified: Secondary | ICD-10-CM | POA: Diagnosis not present

## 2018-10-16 DIAGNOSIS — M25471 Effusion, right ankle: Secondary | ICD-10-CM | POA: Diagnosis not present

## 2018-10-16 DIAGNOSIS — M62561 Muscle wasting and atrophy, not elsewhere classified, right lower leg: Secondary | ICD-10-CM | POA: Diagnosis not present

## 2018-10-16 DIAGNOSIS — R269 Unspecified abnormalities of gait and mobility: Secondary | ICD-10-CM | POA: Diagnosis not present

## 2018-10-21 DIAGNOSIS — R269 Unspecified abnormalities of gait and mobility: Secondary | ICD-10-CM | POA: Diagnosis not present

## 2018-10-21 DIAGNOSIS — M25471 Effusion, right ankle: Secondary | ICD-10-CM | POA: Diagnosis not present

## 2018-10-21 DIAGNOSIS — M62561 Muscle wasting and atrophy, not elsewhere classified, right lower leg: Secondary | ICD-10-CM | POA: Diagnosis not present

## 2018-10-21 DIAGNOSIS — M25671 Stiffness of right ankle, not elsewhere classified: Secondary | ICD-10-CM | POA: Diagnosis not present

## 2018-10-23 NOTE — Progress Notes (Deleted)
Subjective:   Wendy Christensen is a 73 y.o. female who presents for Medicare Annual (Subsequent) preventive examination.  Review of Systems: No ROS.  Medicare Wellness Visit. Additional risk factors are reflected in the social history.   Sleep patterns:    Home Safety/Smoke Alarms: Feels safe in home. Smoke alarms in place.    Female:   Pap-       Mammo- utd      Dexa scan-        CCS- last 11/14/11 w/ 81yr recall     Objective:     Vitals: There were no vitals taken for this visit.  There is no height or weight on file to calculate BMI.  Advanced Directives 11/17/2016 10/15/2015 07/07/2015 06/21/2015 03/26/2015 02/24/2015  Does Patient Have a Medical Advance Directive? No No No Yes Yes Yes  Type of Advance Directive - - - - Living will Living will;Healthcare Power of Attorney  Does patient want to make changes to medical advance directive? - - No - Patient declined Yes - information given No - Patient declined -  Copy of [place] in Chart? - - No - copy requested - No - copy requested -  Would patient like information on creating a medical advance directive? No - Patient declined Yes - Scientist, clinical (histocompatibility and immunogenetics) given - - - -    Tobacco Social History   Tobacco Use  Smoking Status Never Smoker  Smokeless Tobacco Never Used     Counseling given: Not Answered   Clinical Intake:                       Past Medical History:  Diagnosis Date  . Allergic rhinitis   . Anxiety   . Asthma, intrinsic   . Depression   . Frequency of urination   . GERD (gastroesophageal reflux disease)   . Hyperlipidemia   . Hypertension   . Kidney stone    Left kidney-calcium   . OSA on CPAP    PT IN PROCESS OF GETTING MACHINE SET UP  . Thyroid goiter   . Urgency of urination   . Wears glasses    Past Surgical History:  Procedure Laterality Date  . BREAST BIOPSY    . CATARACT EXTRACTION, BILATERAL    . CYSTOSCOPY WITH RETROGRADE PYELOGRAM, URETEROSCOPY  AND STENT PLACEMENT Left 06/21/2015   Procedure: CYSTOSCOPY WITH RETROGRADE PYELOGRAM, URETEROSCOPY AND STENT PLACEMENT;  Surgeon: Nickie Retort, MD;  Location: [facility];  Service: Urology;  Laterality: Left;  . DILATION AND CURETTAGE OF UTERUS  1990   W/ HYSTEROSCOPY  . EYE SURGERY     retina both eyes 6/19  . HOLMIUM LASER APPLICATION Left 37/05/239   Procedure: HOLMIUM LASER APPLICATION;  Surgeon: Nickie Retort, MD;  Location: [facility];  Service: Urology;  Laterality: Left;  . STONE EXTRACTION WITH BASKET Left 06/21/2015   Procedure: STONE EXTRACTION WITH BASKET;  Surgeon: Nickie Retort, MD;  Location: [facility];  Service: Urology;  Laterality: Left;  . TUBAL LIGATION  1980's   Family History  Problem Relation Age of Onset  . Hypertension Mother   . Heart disease Mother   . COPD Father   . Asthma Father   . Hyperlipidemia Unknown   . Hypertension Unknown   . Kidney cancer Maternal Aunt   . Heart attack Maternal Aunt   . Stroke Brother   . Hypertension Brother   . Hypertension  Brother   . Breast cancer Cousin   . Breast cancer Cousin    Social History   Socioeconomic History  . Marital status: Married    Spouse name: Not on file  . Number of children: Not on file  . Years of education: Not on file  . Highest education level: Not on file  Occupational History  . Occupation: Retired    Fish farm manager: DISABLE  Social Needs  . Financial resource strain: Not on file  . Food insecurity:    Worry: Not on file    Inability: Not on file  . Transportation needs:    Medical: Not on file    Non-medical: Not on file  Tobacco Use  . Smoking status: Never Smoker  . Smokeless tobacco: Never Used  Substance and Sexual Activity  . Alcohol use: Yes    Alcohol/week: 0.0 standard drinks    Comment: 1 beer every once in awhile   . Drug use: No  . Sexual activity: Yes    Partners: Male    Comment: Husband   Lifestyle    . Physical activity:    Days per week: Not on file    Minutes per session: Not on file  . Stress: Not on file  Relationships  . Social connections:    Talks on phone: Not on file    Gets together: Not on file    Attends religious service: Not on file    Active member of club or organization: Not on file    Attends meetings of clubs or organizations: Not on file    Relationship status: Not on file  Other Topics Concern  . 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 albuterol (PROAIR HFA) 108 (90 Base) MCG/ACT inhaler INHALE 2 PUFFS INTO THE LUNGS EVERY 4 HOURS AS NEEDED FOR WHEEZING  . atorvastatin (LIPITOR) 40 MG tablet Take 1 tablet (40 mg total) by mouth daily.  Marland Kitchen azelastine (OPTIVAR) 0.05 % ophthalmic solution   . citalopram (CELEXA) 40 MG tablet TAKE 1 TABLET BY MOUTH EVERY MORNING  . docusate sodium (COLACE) 100 MG capsule Take 1 capsule (100 mg total) by mouth 2 (two) times daily.  . fenofibrate 160 MG tablet Take 1 tablet (160 mg total) by mouth daily.  Marland Kitchen FLOVENT HFA 110 MCG/ACT inhaler Inhale 2 puffs into the lungs daily as needed.  . fluticasone (FLONASE) 50 MCG/ACT nasal spray Place 2 sprays into both nostrils as needed.  Marland Kitchen gentamicin (GARAMYCIN) 0.3 % ophthalmic solution Reported on 09/24/2015  . hydrochlorothiazide (HYDRODIURIL) 25 MG tablet Take 1 tablet (25 mg total) by mouth daily. 1 po qd prn  . HYDROcodone-acetaminophen (NORCO) 10-325 MG tablet Take 1 tablet by mouth every 6 (six) hours as needed.  Marland Kitchen ibuprofen (ADVIL,MOTRIN) 800 MG tablet Take 1 tablet (800 mg total) by mouth every 8 (eight) hours as needed.  . meloxicam (MOBIC) 15 MG tablet TAKE 1 TABLET BY MOUTH DAILY  . metoprolol tartrate (LOPRESSOR) 100 MG tablet TAKE 1 TABLET BY MOUTH TWICE DAILY  . montelukast (SINGULAIR) 10 MG tablet Take 1 tablet (10 mg total) by mouth at bedtime.  . Multiple  Vitamins-Minerals (MULTIVITAMIN ADULT PO) multivitamin  . Multiple Vitamins-Minerals (MULTIVITAMIN WITH MINERALS) tablet Take 1 tablet by mouth daily.  Marland Kitchen olmesartan (BENICAR) 40 MG tablet TAKE 1 TABLET BY MOUTH DAILY  . omeprazole (PRILOSEC) 20 MG capsule TAKE 1  CAPSULE BY MOUTH ONCE DAILY  . promethazine (PHENERGAN) 25 MG tablet Take 1 tablet (25 mg total) by mouth every 8 (eight) hours as needed for nausea or vomiting.  Marland Kitchen Propylene Glycol-Glycerin (MOISTURE EYES OP) Apply to eye.  . rosuvastatin (CRESTOR) 20 MG tablet Take 1 tablet (20 mg total) by mouth daily.  . vitamin B-12 (CYANOCOBALAMIN) 1000 MCG tablet Take 1,000 mcg by mouth daily.   No facility-administered encounter medications on file as of 10/24/2018.     Activities of Daily Living No flowsheet data found.  Patient Care Team: Carollee Herter, Alferd Apa, DO as PCP - General Inda Castle, MD (Inactive) as Consulting Physician (Gastroenterology) Rigoberto Noel, MD as Consulting Physician (Pulmonary Disease) Renato Shin, MD as Consulting Physician (Endocrinology) Rexene Agent, MD as Attending Physician (Nephrology) Nickie Retort, MD as Consulting Physician (Urology) Mosetta Anis, MD as Referring Physician (Allergy) Alanda Slim Neena Rhymes, MD as Consulting Physician (Ophthalmology) Ena Dawley, MD as Consulting Physician (Obstetrics and Gynecology)    Assessment:   This is a routine wellness examination for [place]. Physical assessment deferred to PCP.  Exercise Activities and Dietary recommendations   Diet (meal preparation, eat out, water intake, caffeinated beverages, dairy products, fruits and vegetables): {Desc; diets:16563} Breakfast: Lunch:  Dinner:      Goals    . Keep blood pressure below 130/80 (pt-stated)    . Keep cholesterol within normal range (pt-stated)       Fall Risk Fall Risk  03/08/2018 11/17/2016 10/15/2015 07/07/2015 09/22/2013  Falls in the past year? No No No No No  Number falls in  past yr: - - - 2 or more -  Comment - - - walking down steps with socks on  -  Injury with Fall? - - - No -  Follow up - - - Education provided;Falls prevention discussed -    Depression Screen PHQ 2/9 Scores 03/08/2018 11/17/2016 10/15/2015 07/07/2015  PHQ - 2 Score 0 0 0 0     Cognitive Function MMSE - Mini Mental State Exam 07/07/2015  Orientation to time 5  Orientation to Place 5  Registration 3  Attention/ Calculation 5  Recall 2  Language- name 2 objects 2  Language- repeat 1  Language- follow 3 step command 3  Language- read & follow direction 1  Write a sentence 1  Copy design 1  Total score 29        Immunization History  Administered Date(s) Administered  . Influenza Split 05/29/2012, 06/17/2013  . Influenza Whole 07/31/2007  . Influenza, High Dose Seasonal PF 07/02/2015, 05/24/2017, 07/16/2018  . Influenza-Unspecified 08/07/2014, 06/19/2016  . Pneumococcal Conjugate-13 07/07/2015  . Pneumococcal Polysaccharide-23 12/16/2012  . Td 04/18/2005  . Zoster 04/24/2008    Screening Tests Health Maintenance  Topic Date Due  . TETANUS/TDAP  04/19/2015  . COLONOSCOPY  11/13/2016  . MAMMOGRAM  11/13/2018  . INFLUENZA VACCINE  Completed  . DEXA SCAN  Completed  . Hepatitis C Screening  Completed  . PNA vac Low Risk Adult  Completed        Plan:   ***   I have personally reviewed and noted the following in the patient's chart:   . Medical and social history . Use of alcohol, tobacco or illicit drugs  . Current medications and supplements . Functional ability and status . Nutritional status . Physical activity . Advanced directives . List of other physicians . Hospitalizations, surgeries, and ER visits in previous 12 months . Vitals . Screenings to  include cognitive, depression, and falls . Referrals and appointments  In addition, I have reviewed and discussed with patient certain preventive protocols, quality metrics, and best practice  recommendations. A written personalized care plan for preventive services as well as general preventive health recommendations were provided to patient.     Shela Nevin, [place]  10/23/2018

## 2018-10-24 ENCOUNTER — Ambulatory Visit: Payer: Self-pay | Admitting: *Deleted

## 2018-10-28 DIAGNOSIS — M25471 Effusion, right ankle: Secondary | ICD-10-CM | POA: Diagnosis not present

## 2018-10-28 DIAGNOSIS — M62561 Muscle wasting and atrophy, not elsewhere classified, right lower leg: Secondary | ICD-10-CM | POA: Diagnosis not present

## 2018-10-28 DIAGNOSIS — R269 Unspecified abnormalities of gait and mobility: Secondary | ICD-10-CM | POA: Diagnosis not present

## 2018-10-28 DIAGNOSIS — M25671 Stiffness of right ankle, not elsewhere classified: Secondary | ICD-10-CM | POA: Diagnosis not present

## 2018-10-29 ENCOUNTER — Ambulatory Visit (INDEPENDENT_AMBULATORY_CARE_PROVIDER_SITE_OTHER): Payer: Self-pay | Admitting: Sports Medicine

## 2018-10-29 ENCOUNTER — Encounter: Payer: Self-pay | Admitting: Sports Medicine

## 2018-10-29 DIAGNOSIS — M25571 Pain in right ankle and joints of right foot: Secondary | ICD-10-CM

## 2018-10-29 DIAGNOSIS — M779 Enthesopathy, unspecified: Secondary | ICD-10-CM

## 2018-10-29 DIAGNOSIS — S99911D Unspecified injury of right ankle, subsequent encounter: Secondary | ICD-10-CM

## 2018-10-29 DIAGNOSIS — Z9889 Other specified postprocedural states: Secondary | ICD-10-CM

## 2018-10-29 NOTE — Progress Notes (Signed)
Subjective: Wendy Christensen is a 73 y.o. female patient seen today in office for POV #5 (DOS 08/12/2018), S/P right peroneal tendon repair with graft.  Patient denies pain at surgical site but reports swelling, denies calf pain, denies headache, chest pain, shortness of breath, nausea, vomiting, fever, or chills. No other issues noted.   Patient Active Problem List   Diagnosis Date Noted  . Preop examination 07/22/2018  . Tendon tear, ankle, right, initial encounter 07/16/2018  . Candidiasis of vagina 04/23/2018  . Uterine prolapse 04/23/2018  . Mass of right axilla 04/18/2018  . Chest pain 05/25/2017  . History of colon polyps 05/25/2017  . Hypertension 07/13/2015  . Renal insufficiency 03/30/2015  . Pain in the abdomen 02/24/2015  . Intrinsic asthma 01/26/2015  . Hyperglycemia 06/19/2013  . Hypothyroidism 06/17/2013  . OSA (obstructive sleep apnea) 01/14/2013  . BACK PAIN, THORACIC REGION, LEFT 11/18/2010  . ANXIETY DEPRESSION 05/20/2009  . SKIN TAG 04/19/2009  . POSTMENOPAUSAL STATUS 12/28/2008  . SINUSITIS- ACUTE-NOS 09/09/2008  . ANXIETY 09/01/2008  . GENERALIZED ANXIETY DISORDER 06/30/2008  . INSOMNIA 04/24/2008  . GOITER NOS 06/04/2007  . NUMBNESS, ARM 06/04/2007  . WEIGHT LOSS 06/04/2007  . Hyperlipidemia LDL goal <100 02/07/2007  . Essential hypertension 02/07/2007  . ALLERGIC RHINITIS 02/07/2007  . GERD 02/07/2007  . COLONOSCOPY, HX OF 02/07/2007  . DILATION AND CURETTAGE, HX OF 02/07/2007    Current Outpatient Medications on File Prior to Visit  Medication Sig Dispense Refill  . albuterol (PROAIR HFA) 108 (90 Base) MCG/ACT inhaler INHALE 2 PUFFS INTO THE LUNGS EVERY 4 HOURS AS NEEDED FOR WHEEZING 8.5 g 3  . atorvastatin (LIPITOR) 40 MG tablet Take 1 tablet (40 mg total) by mouth daily. 90 tablet 1  . azelastine (OPTIVAR) 0.05 % ophthalmic solution     . citalopram (CELEXA) 40 MG tablet TAKE 1 TABLET BY MOUTH EVERY MORNING 90 tablet 0  . docusate  sodium (COLACE) 100 MG capsule Take 1 capsule (100 mg total) by mouth 2 (two) times daily. 10 capsule 0  . fenofibrate 160 MG tablet Take 1 tablet (160 mg total) by mouth daily. 90 tablet 1  . FLOVENT HFA 110 MCG/ACT inhaler Inhale 2 puffs into the lungs daily as needed. 1 Inhaler 3  . fluticasone (FLONASE) 50 MCG/ACT nasal spray Place 2 sprays into both nostrils as needed.  3  . gentamicin (GARAMYCIN) 0.3 % ophthalmic solution Reported on 09/24/2015  0  . HYDROcodone-acetaminophen (NORCO) 10-325 MG tablet Take 1 tablet by mouth every 6 (six) hours as needed. 20 tablet 0  . ibuprofen (ADVIL,MOTRIN) 800 MG tablet Take 1 tablet (800 mg total) by mouth every 8 (eight) hours as needed. 30 tablet 0  . meloxicam (MOBIC) 15 MG tablet TAKE 1 TABLET BY MOUTH DAILY 90 tablet 0  . metoprolol tartrate (LOPRESSOR) 100 MG tablet TAKE 1 TABLET BY MOUTH TWICE DAILY 180 tablet 1  . montelukast (SINGULAIR) 10 MG tablet Take 1 tablet (10 mg total) by mouth at bedtime. 90 tablet 3  . Multiple Vitamins-Minerals (MULTIVITAMIN ADULT PO) multivitamin    . Multiple Vitamins-Minerals (MULTIVITAMIN WITH MINERALS) tablet Take 1 tablet by mouth daily.    Marland Kitchen olmesartan (BENICAR) 40 MG tablet TAKE 1 TABLET BY MOUTH DAILY 90 tablet 1  . omeprazole (PRILOSEC) 20 MG capsule TAKE 1 CAPSULE BY MOUTH ONCE DAILY 90 capsule 1  . promethazine (PHENERGAN) 25 MG tablet Take 1 tablet (25 mg total) by mouth every 8 (eight) hours as needed  for nausea or vomiting. 20 tablet 0  . Propylene Glycol-Glycerin (MOISTURE EYES OP) Apply to eye.    . rosuvastatin (CRESTOR) 20 MG tablet Take 1 tablet (20 mg total) by mouth daily. 30 tablet 2  . vitamin B-12 (CYANOCOBALAMIN) 1000 MCG tablet Take 1,000 mcg by mouth daily.    . hydrochlorothiazide (HYDRODIURIL) 25 MG tablet Take 1 tablet (25 mg total) by mouth daily. 1 po qd prn 90 tablet 1   No current facility-administered medications on file prior to visit.     No Known Allergies  Objective: There  were no vitals filed for this visit.  General: No acute distress, AAOx3  Right foot: Incision site well healed, mild swelling to right lateral foot and ankle, no erythema, no warmth, no drainage, no signs of infection noted, Capillary fill time <3 seconds in all digits, gross sensation present via light touch to right foot. No pain or crepitation with range of motion right foot.  No pain with calf compression.   Assessment and Plan:  Problem List Items Addressed This Visit    None    Visit Diagnoses    Postoperative state    -  Primary   Injury of right ankle, subsequent encounter       Tendonitis       Right ankle pain, unspecified chronicity          -Patient seen and evaluated -Continue with PT -Continue with compression sleeve to assist with edema control -Continue with good supportive shoes  -Continue with rest, ice, elevation as needed -Return in 2 months for follow up evaluation or sooner if problems arise.   Landis Martins, DPM

## 2018-10-31 DIAGNOSIS — G4733 Obstructive sleep apnea (adult) (pediatric): Secondary | ICD-10-CM | POA: Diagnosis not present

## 2018-11-04 DIAGNOSIS — M25671 Stiffness of right ankle, not elsewhere classified: Secondary | ICD-10-CM | POA: Diagnosis not present

## 2018-11-04 DIAGNOSIS — M62561 Muscle wasting and atrophy, not elsewhere classified, right lower leg: Secondary | ICD-10-CM | POA: Diagnosis not present

## 2018-11-04 DIAGNOSIS — M25471 Effusion, right ankle: Secondary | ICD-10-CM | POA: Diagnosis not present

## 2018-11-04 DIAGNOSIS — R269 Unspecified abnormalities of gait and mobility: Secondary | ICD-10-CM | POA: Diagnosis not present

## 2018-11-11 DIAGNOSIS — M62561 Muscle wasting and atrophy, not elsewhere classified, right lower leg: Secondary | ICD-10-CM | POA: Diagnosis not present

## 2018-11-11 DIAGNOSIS — M25471 Effusion, right ankle: Secondary | ICD-10-CM | POA: Diagnosis not present

## 2018-11-11 DIAGNOSIS — M25671 Stiffness of right ankle, not elsewhere classified: Secondary | ICD-10-CM | POA: Diagnosis not present

## 2018-11-11 DIAGNOSIS — R269 Unspecified abnormalities of gait and mobility: Secondary | ICD-10-CM | POA: Diagnosis not present

## 2018-11-18 DIAGNOSIS — M25671 Stiffness of right ankle, not elsewhere classified: Secondary | ICD-10-CM | POA: Diagnosis not present

## 2018-11-18 DIAGNOSIS — M25471 Effusion, right ankle: Secondary | ICD-10-CM | POA: Diagnosis not present

## 2018-11-18 DIAGNOSIS — R269 Unspecified abnormalities of gait and mobility: Secondary | ICD-10-CM | POA: Diagnosis not present

## 2018-11-18 DIAGNOSIS — M62561 Muscle wasting and atrophy, not elsewhere classified, right lower leg: Secondary | ICD-10-CM | POA: Diagnosis not present

## 2018-11-25 DIAGNOSIS — M62561 Muscle wasting and atrophy, not elsewhere classified, right lower leg: Secondary | ICD-10-CM | POA: Diagnosis not present

## 2018-11-25 DIAGNOSIS — R269 Unspecified abnormalities of gait and mobility: Secondary | ICD-10-CM | POA: Diagnosis not present

## 2018-11-25 DIAGNOSIS — M25471 Effusion, right ankle: Secondary | ICD-10-CM | POA: Diagnosis not present

## 2018-11-25 DIAGNOSIS — M25671 Stiffness of right ankle, not elsewhere classified: Secondary | ICD-10-CM | POA: Diagnosis not present

## 2018-12-25 ENCOUNTER — Other Ambulatory Visit: Payer: Self-pay | Admitting: Family Medicine

## 2018-12-25 ENCOUNTER — Other Ambulatory Visit: Payer: Self-pay | Admitting: Sports Medicine

## 2018-12-25 DIAGNOSIS — F411 Generalized anxiety disorder: Secondary | ICD-10-CM

## 2018-12-25 DIAGNOSIS — M25571 Pain in right ankle and joints of right foot: Secondary | ICD-10-CM

## 2018-12-25 DIAGNOSIS — M779 Enthesopathy, unspecified: Secondary | ICD-10-CM

## 2018-12-25 DIAGNOSIS — S99911D Unspecified injury of right ankle, subsequent encounter: Secondary | ICD-10-CM

## 2018-12-31 ENCOUNTER — Telehealth: Payer: Self-pay | Admitting: Family Medicine

## 2018-12-31 ENCOUNTER — Other Ambulatory Visit: Payer: Self-pay

## 2018-12-31 ENCOUNTER — Ambulatory Visit: Payer: Medicare Other | Admitting: Sports Medicine

## 2018-12-31 ENCOUNTER — Ambulatory Visit (INDEPENDENT_AMBULATORY_CARE_PROVIDER_SITE_OTHER): Payer: Medicare Other | Admitting: Family Medicine

## 2018-12-31 DIAGNOSIS — J069 Acute upper respiratory infection, unspecified: Secondary | ICD-10-CM | POA: Diagnosis not present

## 2018-12-31 DIAGNOSIS — I10 Essential (primary) hypertension: Secondary | ICD-10-CM

## 2018-12-31 DIAGNOSIS — E785 Hyperlipidemia, unspecified: Secondary | ICD-10-CM

## 2018-12-31 DIAGNOSIS — F411 Generalized anxiety disorder: Secondary | ICD-10-CM | POA: Diagnosis not present

## 2018-12-31 MED ORDER — FLUTICASONE PROPIONATE 50 MCG/ACT NA SUSP: 2.0000 | NASAL | 3 refills | Status: DC | PRN

## 2018-12-31 MED ORDER — FLOVENT HFA 110 MCG/ACT IN AERO: 2.0000 | INHALATION_SPRAY | Freq: Every day | RESPIRATORY_TRACT | 3 refills | Status: DC | PRN

## 2018-12-31 MED ORDER — ALBUTEROL SULFATE HFA 108 (90 BASE) MCG/ACT IN AERS: INHALATION_SPRAY | RESPIRATORY_TRACT | 3 refills | Status: DC

## 2018-12-31 MED ORDER — OMEPRAZOLE 20 MG PO CPDR: 20.0000 mg | DELAYED_RELEASE_CAPSULE | Freq: Every day | ORAL | 1 refills | Status: DC

## 2018-12-31 MED ORDER — OLMESARTAN MEDOXOMIL 40 MG PO TABS: 40.0000 mg | ORAL_TABLET | Freq: Every day | ORAL | 1 refills | Status: DC

## 2018-12-31 MED ORDER — CITALOPRAM HYDROBROMIDE 40 MG PO TABS: 40.0000 mg | ORAL_TABLET | Freq: Every morning | ORAL | 3 refills | Status: DC

## 2018-12-31 MED ORDER — ROSUVASTATIN CALCIUM 20 MG PO TABS: 20.0000 mg | ORAL_TABLET | Freq: Every day | ORAL | 2 refills | Status: DC

## 2018-12-31 MED ORDER — HYDROCHLOROTHIAZIDE 25 MG PO TABS: 25.0000 mg | ORAL_TABLET | Freq: Every day | ORAL | 1 refills | Status: DC

## 2018-12-31 MED ORDER — MONTELUKAST SODIUM 10 MG PO TABS: 10.0000 mg | ORAL_TABLET | Freq: Every day | ORAL | 3 refills | Status: DC

## 2018-12-31 MED ORDER — FENOFIBRATE 160 MG PO TABS: 160.0000 mg | ORAL_TABLET | Freq: Every day | ORAL | 1 refills | Status: DC

## 2018-12-31 MED ORDER — METOPROLOL TARTRATE 100 MG PO TABS: 100.0000 mg | ORAL_TABLET | Freq: Two times a day (BID) | ORAL | 1 refills | Status: DC

## 2018-12-31 MED ORDER — AZELASTINE HCL 0.05 % OP SOLN: 1.0000 [drp] | Freq: Two times a day (BID) | OPHTHALMIC | 5 refills | Status: DC

## 2018-12-31 NOTE — Progress Notes (Signed)
Virtual Visit via Video Note  I connected with Wendy Christensen on 12/31/18 at 10:00 AM EDT by a video enabled telemedicine application and verified that I am speaking with the correct person using two identifiers.   I discussed the limitations of evaluation and management by telemedicine and the availability of in person appointments. The patient expressed understanding and agreed to proceed.  History of Present Illness: Pt is home with no complaints.  She needs f/u and refills for anxiety, cholesterol and bp .     Observations/Objective: 110/60  57  98.1   rr normal Pt in NAd   Assessment and Plan: 1. Generalized anxiety disorder Stable-- refill meds  - citalopram (CELEXA) 40 MG tablet; Take 1 tablet (40 mg total) by mouth every morning.  Dispense: 90 tablet; Refill: 3  2. Viral upper respiratory illness Not acute Refill inhalers  - albuterol (PROAIR HFA) 108 (90 Base) MCG/ACT inhaler; INHALE 2 PUFFS INTO THE LUNGS EVERY 4 HOURS AS NEEDED FOR WHEEZING  Dispense: 8.5 g; Refill: 3  3. Hyperlipidemia LDL goal <100 Tolerating statin, encouraged heart healthy diet, avoid trans fats, minimize simple carbs and saturated fats. Increase exercise as tolerated - fenofibrate 160 MG tablet; Take 1 tablet (160 mg total) by mouth daily.  Dispense: 90 tablet; Refill: 1 - Lipid panel; Future - Comprehensive metabolic panel; Future  4. Essential hypertension Well controlled, no changes to meds. Encouraged heart healthy diet such as the DASH diet and exercise as tolerated.   - hydrochlorothiazide (HYDRODIURIL) 25 MG tablet; Take 1 tablet (25 mg total) by mouth daily. 1 po qd prn  Dispense: 90 tablet; Refill: 1 - olmesartan (BENICAR) 40 MG tablet; Take 1 tablet (40 mg total) by mouth daily.  Dispense: 90 tablet; Refill: 1 - Lipid panel; Future - Comprehensive metabolic panel; Future   Follow Up Instructions:    I discussed the assessment and treatment plan with the patient. The patient  was provided an opportunity to ask questions and all were answered. The patient agreed with the plan and demonstrated an understanding of the instructions.   The patient was advised to call back or seek an in-person evaluation if the symptoms worsen or if the condition fails to improve as anticipated.     [name], DO

## 2018-12-31 NOTE — Telephone Encounter (Signed)
Appt  Set up for 01-01-19 @ 8am

## 2019-01-01 ENCOUNTER — Other Ambulatory Visit (INDEPENDENT_AMBULATORY_CARE_PROVIDER_SITE_OTHER): Payer: Medicare Other

## 2019-01-01 ENCOUNTER — Other Ambulatory Visit: Payer: Self-pay

## 2019-01-01 DIAGNOSIS — E785 Hyperlipidemia, unspecified: Secondary | ICD-10-CM | POA: Diagnosis not present

## 2019-01-01 DIAGNOSIS — I10 Essential (primary) hypertension: Secondary | ICD-10-CM

## 2019-01-01 LAB — COMPREHENSIVE METABOLIC PANEL
ALT: 10 U/L (ref 0–35)
AST: 12 U/L (ref 0–37)
Albumin: 4.1 g/dL (ref 3.5–5.2)
Alkaline Phosphatase: 72 U/L (ref 39–117)
BUN: 17 mg/dL (ref 6–23)
CO2: 30 mEq/L (ref 19–32)
Calcium: 9.3 mg/dL (ref 8.4–10.5)
Chloride: 103 mEq/L (ref 96–112)
Creatinine, Ser: 1.08 mg/dL (ref 0.40–1.20)
GFR: 60.49 mL/min (ref >60.00)
Glucose, Bld: 119 mg/dL — ABNORMAL HIGH (ref 70–99)
Potassium: 3.9 mEq/L (ref 3.5–5.1)
Sodium: 140 mEq/L (ref 135–145)
Total Bilirubin: 0.4 mg/dL (ref 0.2–1.2)
Total Protein: 7.2 g/dL (ref 6.0–8.3)

## 2019-01-01 LAB — LIPID PANEL
Cholesterol: 180 mg/dL (ref 0–200)
HDL: 59.6 mg/dL (ref >39.00)
LDL Cholesterol: 105 mg/dL — ABNORMAL HIGH (ref 0–99)
NonHDL: 120.55
Total CHOL/HDL Ratio: 3
Triglycerides: 79 mg/dL (ref 0.0–149.0)
VLDL: 15.8 mg/dL (ref 0.0–40.0)

## 2019-01-06 ENCOUNTER — Encounter: Payer: Self-pay | Admitting: *Deleted

## 2019-01-30 DIAGNOSIS — G4733 Obstructive sleep apnea (adult) (pediatric): Secondary | ICD-10-CM | POA: Diagnosis not present

## 2019-02-11 ENCOUNTER — Ambulatory Visit (INDEPENDENT_AMBULATORY_CARE_PROVIDER_SITE_OTHER): Payer: Medicare Other | Admitting: Sports Medicine

## 2019-02-11 ENCOUNTER — Encounter: Payer: Self-pay | Admitting: Sports Medicine

## 2019-02-11 ENCOUNTER — Other Ambulatory Visit: Payer: Self-pay

## 2019-02-11 VITALS — Temp 98.1°F

## 2019-02-11 DIAGNOSIS — M25571 Pain in right ankle and joints of right foot: Secondary | ICD-10-CM

## 2019-02-11 DIAGNOSIS — S99911D Unspecified injury of right ankle, subsequent encounter: Secondary | ICD-10-CM

## 2019-02-11 DIAGNOSIS — M779 Enthesopathy, unspecified: Secondary | ICD-10-CM

## 2019-02-11 DIAGNOSIS — Z9889 Other specified postprocedural states: Secondary | ICD-10-CM

## 2019-02-11 NOTE — Progress Notes (Signed)
Subjective: Wendy Christensen is a 73 y.o. female patient seen today in office for POV #6 (DOS 08/12/2018), S/P right peroneal tendon repair with graft.  Patient denies pain at surgical site but reports swelling from time to time that does away with sleeve or elevating, denies calf pain, denies headache, chest pain, shortness of breath, nausea, vomiting, fever, or chills. No other issues noted.   Patient Active Problem List   Diagnosis Date Noted  . Preop examination 07/22/2018  . Tendon tear, ankle, right, initial encounter 07/16/2018  . Candidiasis of vagina 04/23/2018  . Uterine prolapse 04/23/2018  . Mass of right axilla 04/18/2018  . Chest pain 05/25/2017  . History of colon polyps 05/25/2017  . Hypertension 07/13/2015  . Renal insufficiency 03/30/2015  . Pain in the abdomen 02/24/2015  . Intrinsic asthma 01/26/2015  . Hyperglycemia 06/19/2013  . Hypothyroidism 06/17/2013  . OSA (obstructive sleep apnea) 01/14/2013  . BACK PAIN, THORACIC REGION, LEFT 11/18/2010  . ANXIETY DEPRESSION 05/20/2009  . SKIN TAG 04/19/2009  . POSTMENOPAUSAL STATUS 12/28/2008  . SINUSITIS- ACUTE-NOS 09/09/2008  . ANXIETY 09/01/2008  . GENERALIZED ANXIETY DISORDER 06/30/2008  . INSOMNIA 04/24/2008  . GOITER NOS 06/04/2007  . NUMBNESS, ARM 06/04/2007  . WEIGHT LOSS 06/04/2007  . Hyperlipidemia LDL goal <100 02/07/2007  . Essential hypertension 02/07/2007  . ALLERGIC RHINITIS 02/07/2007  . GERD 02/07/2007  . COLONOSCOPY, HX OF 02/07/2007  . DILATION AND CURETTAGE, HX OF 02/07/2007    Current Outpatient Medications on File Prior to Visit  Medication Sig Dispense Refill  . albuterol (PROAIR HFA) 108 (90 Base) MCG/ACT inhaler INHALE 2 PUFFS INTO THE LUNGS EVERY 4 HOURS AS NEEDED FOR WHEEZING 8.5 g 3  . azelastine (OPTIVAR) 0.05 % ophthalmic solution Place 1 drop into both eyes 2 (two) times daily. 6 mL 5  . citalopram (CELEXA) 40 MG tablet Take 1 tablet (40 mg total) by mouth every morning.  90 tablet 3  . docusate sodium (COLACE) 100 MG capsule Take 1 capsule (100 mg total) by mouth 2 (two) times daily. 10 capsule 0  . fenofibrate 160 MG tablet Take 1 tablet (160 mg total) by mouth daily. 90 tablet 1  . FLOVENT HFA 110 MCG/ACT inhaler Inhale 2 puffs into the lungs daily as needed. 1 Inhaler 3  . fluticasone (FLONASE) 50 MCG/ACT nasal spray Place 2 sprays into both nostrils as needed. 16 g 3  . gentamicin (GARAMYCIN) 0.3 % ophthalmic solution Reported on 09/24/2015  0  . hydrochlorothiazide (HYDRODIURIL) 25 MG tablet Take 1 tablet (25 mg total) by mouth daily. 1 po qd prn 90 tablet 1  . metoprolol tartrate (LOPRESSOR) 100 MG tablet Take 1 tablet (100 mg total) by mouth 2 (two) times daily. 180 tablet 1  . montelukast (SINGULAIR) 10 MG tablet Take 1 tablet (10 mg total) by mouth at bedtime. 90 tablet 3  . Multiple Vitamins-Minerals (MULTIVITAMIN ADULT PO) multivitamin    . Multiple Vitamins-Minerals (MULTIVITAMIN WITH MINERALS) tablet Take 1 tablet by mouth daily.    Marland Kitchen olmesartan (BENICAR) 40 MG tablet Take 1 tablet (40 mg total) by mouth daily. 90 tablet 1  . omeprazole (PRILOSEC) 20 MG capsule Take 1 capsule (20 mg total) by mouth daily. 90 capsule 1  . promethazine (PHENERGAN) 25 MG tablet Take 1 tablet (25 mg total) by mouth every 8 (eight) hours as needed for nausea or vomiting. 20 tablet 0  . Propylene Glycol-Glycerin (MOISTURE EYES OP) Apply to eye.    . rosuvastatin (  CRESTOR) 20 MG tablet Take 1 tablet (20 mg total) by mouth daily. 30 tablet 2  . vitamin B-12 (CYANOCOBALAMIN) 1000 MCG tablet Take 1,000 mcg by mouth daily.     No current facility-administered medications on file prior to visit.     No Known Allergies  Objective: There were no vitals filed for this visit.  General: No acute distress, AAOx3  Right foot: Incision site well healed, minimal swelling to right lateral foot and ankle, no erythema, no warmth, no drainage, no signs of infection noted, Capillary fill  time <3 seconds in all digits, gross sensation present via light touch to right foot. No pain or crepitation with range of motion right foot.  No pain with calf compression.   Assessment and Plan:  Problem List Items Addressed This Visit    None    Visit Diagnoses    Postoperative state    -  Primary   Injury of right ankle, subsequent encounter       Tendonitis       Right ankle pain, unspecified chronicity          -Patient seen and evaluated -PT completed  -Continue with compression sleeve to assist with edema control PRN  -Continue with good supportive shoes and activities as desired with no limitations -Continue with rest, ice, elevation as needed -Return PRN. Discharged from post op care.  Landis Martins, DPM

## 2019-03-12 DIAGNOSIS — J300 Vasomotor rhinitis: Secondary | ICD-10-CM | POA: Diagnosis not present

## 2019-03-12 DIAGNOSIS — J4530 Mild persistent asthma, uncomplicated: Secondary | ICD-10-CM | POA: Diagnosis not present

## 2019-03-12 DIAGNOSIS — T781XXD Other adverse food reactions, not elsewhere classified, subsequent encounter: Secondary | ICD-10-CM | POA: Diagnosis not present

## 2019-03-12 DIAGNOSIS — H1045 Other chronic allergic conjunctivitis: Secondary | ICD-10-CM | POA: Diagnosis not present

## 2019-04-02 ENCOUNTER — Other Ambulatory Visit: Payer: Self-pay

## 2019-04-02 NOTE — Patient Outreach (Signed)
[facility] [facility]) Care Management  04/02/2019  Scottlyn Mchaney 1946-08-21 [id number]   Medication Adherence call to Mrs. Garnet Sierras [facility] Identifiers Verify spoke with patient she is due on Olmesartan 40 mg patient explain she is taking 1 tablet daily and ask if we can call [facility] an order this medication [facility] for [facility] to pick [facility]. Mrs. Rosana Hoes is showing past due under [place].   [place] Management Direct Dial [phone]  Fax [phone] [email or web address]

## 2019-04-15 ENCOUNTER — Other Ambulatory Visit: Payer: Self-pay | Admitting: *Deleted

## 2019-04-15 MED ORDER — ROSUVASTATIN CALCIUM 20 MG PO TABS: 20.0000 mg | ORAL_TABLET | Freq: Every day | ORAL | 1 refills | Status: DC

## 2019-04-24 ENCOUNTER — Other Ambulatory Visit: Payer: Self-pay | Admitting: Family Medicine

## 2019-04-24 DIAGNOSIS — E785 Hyperlipidemia, unspecified: Secondary | ICD-10-CM

## 2019-04-29 DIAGNOSIS — H04123 Dry eye syndrome of bilateral lacrimal glands: Secondary | ICD-10-CM | POA: Diagnosis not present

## 2019-05-02 DIAGNOSIS — G4733 Obstructive sleep apnea (adult) (pediatric): Secondary | ICD-10-CM | POA: Diagnosis not present

## 2019-06-02 DIAGNOSIS — Z1211 Encounter for screening for malignant neoplasm of colon: Secondary | ICD-10-CM | POA: Diagnosis not present

## 2019-06-17 DIAGNOSIS — Z961 Presence of intraocular lens: Secondary | ICD-10-CM | POA: Diagnosis not present

## 2019-06-17 DIAGNOSIS — H33313 Horseshoe tear of retina without detachment, bilateral: Secondary | ICD-10-CM | POA: Diagnosis not present

## 2019-06-24 ENCOUNTER — Other Ambulatory Visit: Payer: Self-pay | Admitting: *Deleted

## 2019-06-24 MED ORDER — METOPROLOL TARTRATE 100 MG PO TABS: 100.0000 mg | ORAL_TABLET | Freq: Two times a day (BID) | ORAL | 0 refills | Status: DC

## 2019-07-24 ENCOUNTER — Encounter: Payer: Self-pay | Admitting: Obstetrics & Gynecology

## 2019-07-24 ENCOUNTER — Telehealth: Payer: Medicare Other | Admitting: Emergency Medicine

## 2019-07-24 DIAGNOSIS — R05 Cough: Secondary | ICD-10-CM

## 2019-07-24 DIAGNOSIS — R059 Cough, unspecified: Secondary | ICD-10-CM

## 2019-07-24 DIAGNOSIS — R0981 Nasal congestion: Secondary | ICD-10-CM

## 2019-07-24 MED ORDER — BENZONATATE 100 MG PO CAPS: 100.0000 mg | ORAL_CAPSULE | Freq: Two times a day (BID) | ORAL | 0 refills | Status: DC | PRN

## 2019-07-24 NOTE — Progress Notes (Signed)
We are sorry that you are not feeling well.  Here is how we plan to help!  Based on your presentation I believe you most likely have A cough due to a virus.  This is called viral bronchitis and is best treated by rest, plenty of fluids and control of the cough.  You may use Ibuprofen or Tylenol as directed to help your symptoms.    You cough could also be due to Allerfies   In addition you may use A prescription cough medication called Tessalon Perles 100mg . You may take 1-2 capsules every 8 hours as needed for your cough.   From your responses in the eVisit questionnaire you describe inflammation in the upper respiratory tract which is causing a significant cough.  This is commonly called Bronchitis and has four common causes:    Allergies  Viral Infections  Acid Reflux  Bacterial Infection Allergies, viruses and acid reflux are treated by controlling symptoms or eliminating the cause. An example might be a cough caused by taking certain blood pressure medications. You stop the cough by changing the medication. Another example might be a cough caused by acid reflux. Controlling the reflux helps control the cough.  USE OF BRONCHODILATOR ("RESCUE") INHALERS: There is a risk from using your bronchodilator too frequently.  The risk is that over-reliance on a medication which only relaxes the muscles surrounding the breathing tubes can reduce the effectiveness of medications prescribed to reduce swelling and congestion of the tubes themselves.  Although you feel brief relief from the bronchodilator inhaler, your asthma may actually be worsening with the tubes becoming more swollen and filled with mucus.  This can delay other crucial treatments, such as oral steroid medications. If you need to use a bronchodilator inhaler daily, several times per day, you should discuss this with your provider.  There are probably better treatments that could be used to keep your asthma under control.     HOME  CARE . Only take medications as instructed by your medical team. . Complete the entire course of an antibiotic. . Drink plenty of fluids and get plenty of rest. . Avoid close contacts especially the very young and the elderly . Cover your mouth if you cough or cough into your sleeve. . Always remember to wash your hands . A steam or ultrasonic humidifier can help congestion.   GET HELP RIGHT AWAY IF: . You develop worsening fever. . You become short of breath . You cough up blood. . Your symptoms persist after you have completed your treatment plan MAKE SURE YOU   Understand these instructions.  Will watch your condition.  Will get help right away if you are not doing well or get worse.  Your e-visit answers were reviewed by a board certified advanced clinical practitioner to complete your personal care plan.  Depending on the condition, your plan could have included both over the counter or prescription medications. If there is a problem please reply  once you have received a response from your provider. Your safety is important to [place].  If you have drug allergies check your prescription carefully.    You can use MyChart to ask questions about today's visit, request a non-urgent call back, or ask for a work or school excuse for 24 hours related to this e-Visit. If it has been greater than 24 hours you will need to follow up with your provider, or enter a new e-Visit to address those concerns. You will get an e-mail in the  next two days asking about your experience.  I hope that your e-visit has been valuable and will speed your recovery. Thank you for using e-visits.  Greater than 5 but less than 10 minutes spent researching, coordinating, and implementing care for this patient today

## 2019-07-25 ENCOUNTER — Other Ambulatory Visit: Payer: Self-pay

## 2019-07-25 DIAGNOSIS — Z20822 Contact with and (suspected) exposure to covid-19: Secondary | ICD-10-CM

## 2019-07-27 LAB — NOVEL CORONAVIRUS, NAA: SARS-CoV-2, NAA: NOT DETECTED

## 2019-08-07 ENCOUNTER — Other Ambulatory Visit: Payer: Self-pay

## 2019-08-07 NOTE — Patient Outreach (Signed)
[facility]) Care Management  08/07/2019  Brittani Glidewell [birth year] [id number]   Medication Adherence call to [name] Hippa Identifiers Verify spoke with patient she is past due on Olmesartan 40 mg,patient explain she take 1 tablet daily,patient has place an order thru her pharmacy and will pick up soon.[name] is showing past due under [place].   [place] Management Direct Dial [phone]  Fax [phone] [email or web address]

## 2019-08-20 ENCOUNTER — Other Ambulatory Visit: Payer: Self-pay | Admitting: Family Medicine

## 2019-08-20 ENCOUNTER — Other Ambulatory Visit: Payer: Self-pay

## 2019-08-20 DIAGNOSIS — I10 Essential (primary) hypertension: Secondary | ICD-10-CM

## 2019-08-20 MED ORDER — OLMESARTAN MEDOXOMIL 40 MG PO TABS: 40.0000 mg | ORAL_TABLET | Freq: Every day | ORAL | 0 refills | Status: DC

## 2019-08-20 NOTE — Telephone Encounter (Signed)
Requested medication (s) are due for refill today: yes  Requested medication (s) are on the active medication list: yes  Last refill:  12/31/2018  Future visit scheduled: no  Notes to clinic: overdue for office visit  Review for refill   Requested Prescriptions  Pending Prescriptions Disp Refills   olmesartan (BENICAR) 40 MG tablet 90 tablet 1    Sig: Take 1 tablet (40 mg total) by mouth daily.     Cardiovascular:  Angiotensin Receptor Blockers Failed - 08/20/2019  9:47 AM      Failed - Cr in normal range and within 180 days    Creat  Date Value Ref Range Status  11/17/2016 1.33 (H) 0.50 - 0.99 mg/dL Final    Comment:      For patients > or = 71 years of age: The upper reference limit for Creatinine is approximately 13% higher for people identified as African-American.      Creatinine, Ser  Date Value Ref Range Status  01/01/2019 1.08 0.40 - 1.20 mg/dL Final         Failed - K in normal range and within 180 days    Potassium  Date Value Ref Range Status  01/01/2019 3.9 3.5 - 5.1 mEq/L Final         Failed - Last BP in normal range    BP Readings from Last 1 Encounters:  07/22/18 (!) 150/70         Failed - Valid encounter within last 6 months    Recent Outpatient Visits          7 months ago Generalized anxiety disorder   [facility]ivist at [facility], [place], DO   1 year ago Essential hypertension   Archivist at [place], DO   1 year ago Mass of right Tax adviser at [facility], [place], DO   1 year ago Essential hypertension   Archivist at [place], [place], DO   1 year ago Essential hypertension   Archivist at [place], [place] - Patient is not pregnant

## 2019-08-20 NOTE — Telephone Encounter (Signed)
Medication Refill - Medication: olmesartan (BENICAR) 40 MG tablet    Preferred Pharmacy (with phone number or street name):  [facility] [id number], [place] [phone] (Phone) [phone] (Fax)     Agent: Please be advised that RX refills may take up to 3 business days. We ask that you follow-up with your pharmacy.

## 2019-08-20 NOTE — Patient Outreach (Signed)
[facility]) Care Management  08/20/2019  Wendy Christensen [birth year] [id number]   Medication Adherence call to [name] Hippa Identifiers Verify spoke with patient she is past due on Olmesartan 40 mg,patient explain she takes 1 tablet daily she explain she has a few left,patient ask to call her doctor for a new prescription,because [facility] said she did not have any more refills and need it a new prescription left a message at doctors office.Wendy Christensen is showing past due under [place].  [facility] Management Direct Dial [phone]  Fax [phone] [email or web address]

## 2019-09-10 DIAGNOSIS — G4733 Obstructive sleep apnea (adult) (pediatric): Secondary | ICD-10-CM | POA: Diagnosis not present

## 2019-09-17 ENCOUNTER — Telehealth: Payer: Self-pay | Admitting: *Deleted

## 2019-09-17 MED ORDER — OMEPRAZOLE 20 MG PO CPDR: 20.0000 mg | DELAYED_RELEASE_CAPSULE | Freq: Every day | ORAL | 1 refills | Status: DC

## 2019-09-17 MED ORDER — METOPROLOL TARTRATE 100 MG PO TABS: 100.0000 mg | ORAL_TABLET | Freq: Two times a day (BID) | ORAL | 0 refills | Status: DC

## 2019-09-17 NOTE — Telephone Encounter (Signed)
Received metoprolol tartrate 100mg  request from Wendy Christensen. Pt was due for 6 month follow up on 06/24/19 and is past due. Last refill 06/24/19. 2 week supply sent to pharmacy and mychart message sent to pt to schedule virtual follow up.

## 2019-10-01 ENCOUNTER — Other Ambulatory Visit: Payer: Self-pay

## 2019-10-02 ENCOUNTER — Ambulatory Visit (INDEPENDENT_AMBULATORY_CARE_PROVIDER_SITE_OTHER): Payer: Medicare Other | Admitting: Family Medicine

## 2019-10-02 ENCOUNTER — Other Ambulatory Visit: Payer: Self-pay

## 2019-10-02 ENCOUNTER — Encounter: Payer: Self-pay | Admitting: Family Medicine

## 2019-10-02 VITALS — BP 160/80 | HR 58 | Temp 97.6°F | Resp 18 | Ht 65.0 in | Wt 184.4 lb

## 2019-10-02 DIAGNOSIS — F411 Generalized anxiety disorder: Secondary | ICD-10-CM | POA: Diagnosis not present

## 2019-10-02 DIAGNOSIS — E785 Hyperlipidemia, unspecified: Secondary | ICD-10-CM | POA: Diagnosis not present

## 2019-10-02 DIAGNOSIS — R350 Frequency of micturition: Secondary | ICD-10-CM

## 2019-10-02 DIAGNOSIS — I10 Essential (primary) hypertension: Secondary | ICD-10-CM

## 2019-10-02 DIAGNOSIS — T7840XA Allergy, unspecified, initial encounter: Secondary | ICD-10-CM | POA: Diagnosis not present

## 2019-10-02 LAB — COMPREHENSIVE METABOLIC PANEL
ALT: 11 U/L (ref 0–35)
AST: 14 U/L (ref 0–37)
Albumin: 4.2 g/dL (ref 3.5–5.2)
Alkaline Phosphatase: 74 U/L (ref 39–117)
BUN: 16 mg/dL (ref 6–23)
CO2: 30 mEq/L (ref 19–32)
Calcium: 9.1 mg/dL (ref 8.4–10.5)
Chloride: 104 mEq/L (ref 96–112)
Creatinine, Ser: 1.32 mg/dL — ABNORMAL HIGH (ref 0.40–1.20)
GFR: 47.88 mL/min — ABNORMAL LOW (ref >60.00)
Glucose, Bld: 82 mg/dL (ref 70–99)
Potassium: 4 mEq/L (ref 3.5–5.1)
Sodium: 139 mEq/L (ref 135–145)
Total Bilirubin: 0.4 mg/dL (ref 0.2–1.2)
Total Protein: 7.1 g/dL (ref 6.0–8.3)

## 2019-10-02 LAB — LIPID PANEL
Cholesterol: 187 mg/dL (ref 0–200)
HDL: 59.4 mg/dL (ref >39.00)
LDL Cholesterol: 112 mg/dL — ABNORMAL HIGH (ref 0–99)
NonHDL: 127.65
Total CHOL/HDL Ratio: 3
Triglycerides: 76 mg/dL (ref 0.0–149.0)
VLDL: 15.2 mg/dL (ref 0.0–40.0)

## 2019-10-02 MED ORDER — METOPROLOL TARTRATE 100 MG PO TABS: 100.0000 mg | ORAL_TABLET | Freq: Two times a day (BID) | ORAL | 0 refills | Status: DC

## 2019-10-02 MED ORDER — ALPRAZOLAM 0.25 MG PO TABS: 0.2500 mg | ORAL_TABLET | Freq: Two times a day (BID) | ORAL | 0 refills | Status: DC | PRN

## 2019-10-02 MED ORDER — MONTELUKAST SODIUM 10 MG PO TABS: 10.0000 mg | ORAL_TABLET | Freq: Every day | ORAL | 3 refills | Status: DC

## 2019-10-02 MED ORDER — FENOFIBRATE 160 MG PO TABS: 160.0000 mg | ORAL_TABLET | Freq: Every day | ORAL | 1 refills | Status: DC

## 2019-10-02 MED ORDER — ROSUVASTATIN CALCIUM 20 MG PO TABS: 20.0000 mg | ORAL_TABLET | Freq: Every day | ORAL | 1 refills | Status: DC

## 2019-10-02 MED ORDER — CITALOPRAM HYDROBROMIDE 40 MG PO TABS: 40.0000 mg | ORAL_TABLET | Freq: Every morning | ORAL | 3 refills | Status: DC

## 2019-10-02 MED ORDER — HYDROCHLOROTHIAZIDE 25 MG PO TABS: 25.0000 mg | ORAL_TABLET | Freq: Every day | ORAL | 1 refills | Status: DC

## 2019-10-02 NOTE — Patient Instructions (Signed)
COVID-19 Vaccine Information can be found at: [email or web address] For questions related to vaccine distribution or appointments, please email [email or web address] or call [phone].    

## 2019-10-02 NOTE — Progress Notes (Signed)
Patient ID: Wendy Christensen, female    DOB: [birth year]  Age: 74 y.o. MRN: [id number]    Subjective:  Subjective  HPI Wendy Christensen presents for f/u chol and bp and anxiety.   No complaints   Review of Systems  Constitutional: Negative for appetite change, diaphoresis, fatigue and unexpected weight change.  Eyes: Negative for pain, redness and visual disturbance.  Respiratory: Negative for cough, chest tightness, shortness of breath and wheezing.   Cardiovascular: Negative for chest pain, palpitations and leg swelling.  Endocrine: Negative for cold intolerance, heat intolerance, polydipsia, polyphagia and polyuria.  Genitourinary: Negative for difficulty urinating, dysuria and frequency.  Neurological: Negative for dizziness, light-headedness, numbness and headaches.    History Past Medical History:  Diagnosis Date  . Allergic rhinitis   . Anxiety   . Asthma, intrinsic   . Depression   . Frequency of urination   . GERD (gastroesophageal reflux disease)   . Hyperlipidemia   . Hypertension   . Kidney stone    Left kidney-calcium   . OSA on CPAP    PT IN PROCESS OF GETTING MACHINE SET UP  . Thyroid goiter   . Urgency of urination   . Wears glasses     She has a past surgical history that includes Tubal ligation (1980's); Dilation and curettage of uterus (1990); Cystoscopy with retrograde pyelogram, ureteroscopy and stent placement (Left, 06/21/2015); Holmium laser application (Left, 0000000); Stone extraction with basket (Left, 06/21/2015); Eye surgery; Cataract extraction, bilateral; and Breast biopsy.   Her family history includes Asthma in her father; Breast cancer in her cousin and cousin; COPD in her father; Heart attack in her maternal aunt; Heart disease in her mother; Hyperlipidemia in her unknown relative; Hypertension in her brother, brother, mother, and unknown relative; Kidney cancer in her maternal aunt; Stroke in her brother.She reports that she has  never smoked. She has never used smokeless tobacco. She reports current alcohol use. She reports that she does not use drugs.  Current Outpatient Medications on File Prior to Visit  Medication Sig Dispense Refill  . albuterol (PROAIR HFA) 108 (90 Base) MCG/ACT inhaler INHALE 2 PUFFS INTO THE LUNGS EVERY 4 HOURS AS NEEDED FOR WHEEZING 8.5 g 3  . azelastine (OPTIVAR) 0.05 % ophthalmic solution Place 1 drop into both eyes 2 (two) times daily. 6 mL 5  . docusate sodium (COLACE) 100 MG capsule Take 1 capsule (100 mg total) by mouth 2 (two) times daily. 10 capsule 0  . FLOVENT HFA 110 MCG/ACT inhaler Inhale 2 puffs into the lungs daily as needed. 1 Inhaler 3  . fluticasone (FLONASE) 50 MCG/ACT nasal spray Place 2 sprays into both nostrils as needed. 16 g 3  . gentamicin (GARAMYCIN) 0.3 % ophthalmic solution Reported on 09/24/2015  0  . Multiple Vitamins-Minerals (MULTIVITAMIN ADULT PO) multivitamin    . Multiple Vitamins-Minerals (MULTIVITAMIN WITH MINERALS) tablet Take 1 tablet by mouth daily.    Marland Kitchen olmesartan (BENICAR) 40 MG tablet Take 1 tablet (40 mg total) by mouth daily. 90 tablet 0  . omeprazole (PRILOSEC) 20 MG capsule Take 1 capsule (20 mg total) by mouth daily. 90 capsule 1  . promethazine (PHENERGAN) 25 MG tablet Take 1 tablet (25 mg total) by mouth every 8 (eight) hours as needed for nausea or vomiting. 20 tablet 0  . Propylene Glycol-Glycerin (MOISTURE EYES OP) Apply to eye.    . vitamin B-12 (CYANOCOBALAMIN) 1000 MCG tablet Take 1,000 mcg by mouth daily.     No current  facility-administered medications on file prior to visit.     Objective:  Objective  Physical Exam Vitals and nursing note reviewed.  Constitutional:      Appearance: She is well-developed.  HENT:     Head: Normocephalic and atraumatic.  Eyes:     Conjunctiva/sclera: Conjunctivae normal.  Neck:     Thyroid: No thyromegaly.     Vascular: No carotid bruit or JVD.  Cardiovascular:     Rate and Rhythm: Normal rate  and regular rhythm.     Heart sounds: Normal heart sounds. No murmur.  Pulmonary:     Effort: Pulmonary effort is normal. No respiratory distress.     Breath sounds: Normal breath sounds. No wheezing or rales.  Chest:     Chest wall: No tenderness.  Musculoskeletal:     Cervical back: Normal range of motion and neck supple.  Neurological:     Mental Status: She is alert and oriented to person, place, and time.    BP (!) 160/80 (BP Location: Right Arm, Patient Position: Sitting, Cuff Size: Large)   Pulse (!) 58   Temp 97.6 F (36.4 C) (Temporal)   Resp 18   Ht 5\' 5"  (1.651 m)   Wt 184 lb 6.4 oz (83.6 kg)   SpO2 98%   BMI 30.69 kg/m  Wt Readings from Last 3 Encounters:  10/02/19 184 lb 6.4 oz (83.6 kg)  07/22/18 179 lb 12.8 oz (81.6 kg)  04/18/18 177 lb 3.2 oz (80.4 kg)     Lab Results  Component Value Date   WBC 6.1 07/22/2018   HGB 11.2 (L) 07/22/2018   HCT 34.7 (L) 07/22/2018   PLT 271.0 07/22/2018   GLUCOSE 82 10/02/2019   CHOL 187 10/02/2019   TRIG 76.0 10/02/2019   HDL 59.40 10/02/2019   LDLDIRECT 215.7 10/18/2011   LDLCALC 112 (H) 10/02/2019   ALT 11 10/02/2019   AST 14 10/02/2019   NA 139 10/02/2019   K 4.0 10/02/2019   CL 104 10/02/2019   CREATININE 1.32 (H) 10/02/2019   BUN 16 10/02/2019   CO2 30 10/02/2019   TSH 1.24 11/17/2016   HGBA1C 6.4 09/14/2014   MICROALBUR <0.7 03/23/2015    MR FOOT RIGHT WO CONTRAST  Result Date: 06/14/2018 CLINICAL DATA:  Lateral foot pain extending from the 5th metatarsal base into the heel. Patient reports 2 injuries over the last 6 months. Question stress fracture versus tendinitis. EXAM: MRI OF THE RIGHT FOREFOOT WITHOUT CONTRAST TECHNIQUE: Multiplanar, multisequence MR imaging of the right foot was performed. No intravenous contrast was administered. COMPARISON:  Radiographs 06/07/2018 FINDINGS: Bones/Joint/Cartilage Large field-of-view imaging includes most of the foot with the exception of the distal toes. There is no  evidence of acute fracture or dislocation. The alignment is normal at the Lisfranc joint. There are mild degenerative changes at the 1st metatarsophalangeal joint. No significant joint effusions. Ligaments The Lisfranc ligament is intact. Muscles and Tendons There is prominent tendinosis and partial tearing of the peroneus longus tendon where it passes under the calcaneal cuboid articulation, best seen on images 28-36 of series 9. There is associated mild tenosynovitis. These changes are distal to an os peroneum demonstrated on the patient's radiographs. No definite full-thickness tendon tear. The peroneus brevis tendon appears intact. The additional foot muscles and tendons appear intact. Soft tissues Otherwise unremarkable. IMPRESSION: 1. Distal peroneus longus tenosynovitis and partial tearing. No full-thickness tear identified. 2. No evidence of stress fracture or other significant osseous findings in the right foot. 3.  Mild degenerative changes at the 1st metatarsophalangeal joint. Electronically Signed   By: Richardean Sale M.D.   On: 06/14/2018 08:58     Assessment & Plan:  Plan  I have discontinued [name]'s benzonatate. I am also having her start on ALPRAZolam. Additionally, I am having her maintain her multivitamin with minerals, vitamin B-12, gentamicin, Propylene Glycol-Glycerin (MOISTURE EYES OP), Multiple Vitamins-Minerals (MULTIVITAMIN ADULT PO), promethazine, docusate sodium, albuterol, azelastine, Flovent HFA, fluticasone, olmesartan, omeprazole, citalopram, fenofibrate, hydrochlorothiazide, metoprolol tartrate, montelukast, and rosuvastatin.  Meds ordered this encounter  Medications  . citalopram (CELEXA) 40 MG tablet    Sig: Take 1 tablet (40 mg total) by mouth every morning.    Dispense:  90 tablet    Refill:  3  . fenofibrate 160 MG tablet    Sig: Take 1 tablet (160 mg total) by mouth daily.    Dispense:  90 tablet    Refill:  1  . hydrochlorothiazide (HYDRODIURIL)  25 MG tablet    Sig: Take 1 tablet (25 mg total) by mouth daily. 1 po qd prn    Dispense:  90 tablet    Refill:  1  . metoprolol tartrate (LOPRESSOR) 100 MG tablet    Sig: Take 1 tablet (100 mg total) by mouth 2 (two) times daily. Please have patient to call to set up ov    Dispense:  28 tablet    Refill:  0    PT NEEDS OFFICE VISIT FOR GREATER QUANTITIES / REFILLS.  Marland Kitchen montelukast (SINGULAIR) 10 MG tablet    Sig: Take 1 tablet (10 mg total) by mouth at bedtime.    Dispense:  90 tablet    Refill:  3  . rosuvastatin (CRESTOR) 20 MG tablet    Sig: Take 1 tablet (20 mg total) by mouth daily.    Dispense:  90 tablet    Refill:  1  . ALPRAZolam (XANAX) 0.25 MG tablet    Sig: Take 1 tablet (0.25 mg total) by mouth 2 (two) times daily as needed for anxiety.    Dispense:  20 tablet    Refill:  0    Problem List Items Addressed This Visit      Unprioritized   Essential hypertension    Well controlled, no changes to meds. Encouraged heart healthy diet such as the DASH diet and exercise as tolerated.       Relevant Medications   fenofibrate 160 MG tablet   hydrochlorothiazide (HYDRODIURIL) 25 MG tablet   metoprolol tartrate (LOPRESSOR) 100 MG tablet   rosuvastatin (CRESTOR) 20 MG tablet   Other Relevant Orders   Lipid panel (Completed)   Comprehensive metabolic panel (Completed)   GENERALIZED ANXIETY DISORDER    Stable Refill meds       Relevant Medications   citalopram (CELEXA) 40 MG tablet   ALPRAZolam (XANAX) 0.25 MG tablet   Hyperlipidemia LDL goal <100    Tolerating statin, encouraged heart healthy diet, avoid trans fats, minimize simple carbs and saturated fats. Increase exercise as tolerated      Relevant Medications   fenofibrate 160 MG tablet   hydrochlorothiazide (HYDRODIURIL) 25 MG tablet   metoprolol tartrate (LOPRESSOR) 100 MG tablet   rosuvastatin (CRESTOR) 20 MG tablet   Other Relevant Orders   Lipid panel (Completed)   Comprehensive metabolic panel  (Completed)   Urinary frequency    Check urine       Relevant Orders   POCT urinalysis dipstick    Other Visit Diagnoses  Allergy, initial encounter    -  Primary   Relevant Medications   montelukast (SINGULAIR) 10 MG tablet      Follow-up: Return in about 6 months (around 03/31/2020), or if symptoms worsen or fail to improve, for annual exam, fasting.  [name], DO

## 2019-10-05 DIAGNOSIS — R350 Frequency of micturition: Secondary | ICD-10-CM | POA: Insufficient documentation

## 2019-10-05 NOTE — Assessment & Plan Note (Signed)
Tolerating statin, encouraged heart healthy diet, avoid trans fats, minimize simple carbs and saturated fats. Increase exercise as tolerated 

## 2019-10-05 NOTE — Assessment & Plan Note (Signed)
Check urine.

## 2019-10-05 NOTE — Assessment & Plan Note (Signed)
Well controlled, no changes to meds. Encouraged heart healthy diet such as the DASH diet and exercise as tolerated.  °

## 2019-10-05 NOTE — Assessment & Plan Note (Signed)
Stable. Refill meds

## 2020-01-02 ENCOUNTER — Telehealth: Payer: Self-pay | Admitting: Family Medicine

## 2020-01-02 NOTE — Progress Notes (Signed)
  Chronic Care Management   Outreach Note  01/02/2020 Name: Wendy Christensen MRN: [id number] DOB: 19-Dec-1945  Referred by: [name], DO Reason for referral : No chief complaint on file.   An unsuccessful telephone outreach was attempted today. The patient was referred to the pharmacist for assistance with care management and care coordination.   Follow Up Plan:   [name] [employer] Scheduler

## 2020-01-02 NOTE — Progress Notes (Signed)
Error      Wendy Christensen [employer] Scheduler

## 2020-01-02 NOTE — Chronic Care Management (AMB) (Signed)
  Chronic Care Management   Note  01/02/2020 Name: Wendy Christensen MRN: [id number] DOB: 02/07/1946  Wendy Christensen is a 74 y.o. year old female who is a primary care patient of [name], DO. I reached out to [name] by phone today in response to a referral sent by Ms. [name]'s PCP, [name], DO.   Ms. [name] was given information about Chronic Care Management services today including:  1. CCM service includes personalized support from designated clinical staff supervised by her physician, including individualized plan of care and coordination with other care providers 2. 24/7 contact phone numbers for assistance for urgent and routine care needs. 3. Service will only be billed when office clinical staff spend 20 minutes or more in a month to coordinate care. 4. Only one practitioner may furnish and bill the service in a calendar month. 5. The patient may stop CCM services at any time (effective at the end of the month) by phone call to the office staff.   Patient agreed to services and verbal consent obtained.   Follow up plan:   [name] [employer] Scheduler

## 2020-01-30 ENCOUNTER — Ambulatory Visit: Payer: Medicare Other | Admitting: Pharmacist

## 2020-01-30 ENCOUNTER — Other Ambulatory Visit: Payer: Self-pay

## 2020-01-30 DIAGNOSIS — F411 Generalized anxiety disorder: Secondary | ICD-10-CM

## 2020-01-30 DIAGNOSIS — R739 Hyperglycemia, unspecified: Secondary | ICD-10-CM

## 2020-01-30 DIAGNOSIS — E785 Hyperlipidemia, unspecified: Secondary | ICD-10-CM

## 2020-01-30 DIAGNOSIS — I10 Essential (primary) hypertension: Secondary | ICD-10-CM

## 2020-01-30 NOTE — Chronic Care Management (AMB) (Signed)
Chronic Care Management Pharmacy  Name: Wendy Christensen  MRN: [id number] DOB: [birth year]  Chief Complaint/ HPI  Wendy Christensen,  74 y.o. , female presents for their Initial CCM visit with the clinical pharmacist via telephone due to COVID-19 Pandemic.  PCP : [name], DO  Their chronic conditions include: Hypertension, Hyperlipidemia, Pre-Diabetes, Asthma, Depression/Anxiety, GERD, Allergic Rhinitis  Office Visits: 10/02/19: Visit w/ Dr. [name] - Labs ordered (cmp, lipids). Short term alprazolam given.   Consult Visit: 06/17/19: Ophthalmology visit w/ Dr. Alanda Slim  06/02/19: Ob/Gyn visit w/ Dr. Alwyn Pea  02/11/19: Podiatry visit w/ Dr. Cannon Kettle - Post op visit for tendon repair. PT completed. Return PRN. Discharged from post op care.   Medications: Outpatient Encounter Medications as of 01/30/2020  Medication Sig Note  . albuterol (PROAIR HFA) 108 (90 Base) MCG/ACT inhaler INHALE 2 PUFFS INTO THE LUNGS EVERY 4 HOURS AS NEEDED FOR WHEEZING   . ALPRAZolam (XANAX) 0.25 MG tablet Take 1 tablet (0.25 mg total) by mouth 2 (two) times daily as needed for anxiety.   Marland Kitchen azelastine (OPTIVAR) 0.05 % ophthalmic solution Place 1 drop into both eyes 2 (two) times daily. 01/30/2020: Takes as needed. Last used in 2020  . citalopram (CELEXA) 40 MG tablet Take 1 tablet (40 mg total) by mouth every morning.   . fluticasone (FLONASE) 50 MCG/ACT nasal spray Place 2 sprays into both nostrils as needed.   . fluticasone furoate-vilanterol (BREO ELLIPTA) 200-25 MCG/INH AEPB Inhale 1 puff into the lungs daily. Per Dr. Donneta Romberg   . hydrochlorothiazide (HYDRODIURIL) 25 MG tablet Take 1 tablet (25 mg total) by mouth daily. 1 po qd prn 01/30/2020: Uses as needed. Last used February 2021  . metoprolol tartrate (LOPRESSOR) 100 MG tablet Take 1 tablet (100 mg total) by mouth 2 (two) times daily. Please have patient to call to set up ov   . montelukast (SINGULAIR) 10 MG tablet Take 1 tablet (10 mg total) by  mouth at bedtime.   Marland Kitchen olmesartan (BENICAR) 40 MG tablet Take 1 tablet (40 mg total) by mouth daily.   Marland Kitchen omeprazole (PRILOSEC) 20 MG capsule Take 1 capsule (20 mg total) by mouth daily.   . rosuvastatin (CRESTOR) 20 MG tablet Take 1 tablet (20 mg total) by mouth daily.   . vitamin B-12 (CYANOCOBALAMIN) 1000 MCG tablet Take 1,000 mcg by mouth daily.   Marland Kitchen docusate sodium (COLACE) 100 MG capsule Take 1 capsule (100 mg total) by mouth 2 (two) times daily. (Patient not taking: Reported on 01/30/2020)   . fenofibrate 160 MG tablet Take 1 tablet (160 mg total) by mouth daily. (Patient not taking: Reported on 01/30/2020)   . FLOVENT HFA 110 MCG/ACT inhaler Inhale 2 puffs into the lungs daily as needed. (Patient not taking: Reported on 01/30/2020)   . gentamicin (GARAMYCIN) 0.3 % ophthalmic solution Reported on 09/24/2015 09/24/2015: On hold for eye surgery  . Multiple Vitamins-Minerals (MULTIVITAMIN ADULT PO) multivitamin   . Multiple Vitamins-Minerals (MULTIVITAMIN WITH MINERALS) tablet Take 1 tablet by mouth daily.   . promethazine (PHENERGAN) 25 MG tablet Take 1 tablet (25 mg total) by mouth every 8 (eight) hours as needed for nausea or vomiting. (Patient not taking: Reported on 01/30/2020)   . Propylene Glycol-Glycerin (MOISTURE EYES OP) Apply to eye.    No facility-administered encounter medications on file as of 01/30/2020.   SDOH Screenings   Alcohol Screen:   . Last Alcohol Screening Score (AUDIT):   Depression (PHQ2-9): Medium Risk  . PHQ-2 Score:  9  Financial Resource Strain:   . Difficulty of Paying Living Expenses:   Food Insecurity:   . Worried About Charity fundraiser in the Last Year:   . [place] in the Last Year:   Housing:   . Last Housing Risk Score:   Physical Activity:   . Days of Exercise per Week:   . Minutes of Exercise per Session:   Social Connections:   . Frequency of Communication with Friends and Family:   . Frequency of Social Gatherings with Friends and Family:     . Attends Religious Services:   . Active Member of Clubs or Organizations:   . Attends Archivist Meetings:   Marland Kitchen Marital Status:   Stress:   . Feeling of Stress :   Tobacco Use: Low Risk   . Smoking Tobacco Use: Never Smoker  . Smokeless Tobacco Use: Never Used  Transportation Needs:   . Film/video editor (Medical):   Marland Kitchen Lack of Transportation (Non-Medical):      Current Diagnosis/Assessment:  Goals Addressed            This Visit's Progress   . Pharmacy Care Plan       CARE PLAN ENTRY  Current Barriers:  . Chronic Disease Management support, education, and care coordination needs related to Hypertension, Hyperlipidemia, Pre-Diabetes, Asthma, Depression/Anxiety, GERD, Allergic Rhinitis   Hypertension . Pharmacist Clinical Goal(s): o Over the next 90 days, patient will work with PharmD and providers to achieve BP goal <140/90 . Current regimen:  o Metoprolol tartrate 100mg  twice daily, olmesartan 40mg  daily, hctz 25mg  as needed . Interventions: o Requested patient to check blood pressure 1-2 times per week and record . Patient self care activities - Over the next 90 days, patient will: o Check BP 1-2 times per week, document, and provide at future appointments o Ensure daily salt intake < 2300 mg/Ksenia Kunz  Hyperlipidemia . Pharmacist Clinical Goal(s): o Over the next 90 days, patient will work with PharmD and providers to achieve LDL goal <100 . Current regimen:  o Rosuvastatin 20mg  daily . Interventions: o Discussed how sweets can affect cholesterol  . Patient self care activities - Over the next 90 days, patient will: o Reduce the amount of sweets consumed  Pre-Diabetes . Pharmacist Clinical Goal(s): o Over the next 90 days, patient will work with PharmD and providers to maintain A1c goal <6.5% . Current regimen:  o Diet and exercise management   . Patient self care activities - Over the next 90 days, patient will: o Reduce the amount of sweets  consumed  Depression/Anxiety . Pharmacist Clinical Goal(s) o Over the next 90 days, patient will work with PharmD and providers to reduce symptoms of depression/anxiety . Current regimen:  o Citalopram 40mg  daily, alprazolam 0.25mg  twice daily as needed for anxiety . Interventions: o Discussed importance of following up with psychiatrist/counselor or PCP team for mental health support . Patient self care activities - Over the next 90 days, patient will: o Reach out to psychiatrist or PCP team for mental health support  Medication management . Pharmacist Clinical Goal(s): o Over the next 90 days, patient will work with PharmD and providers to achieve optimal medication adherence . Current pharmacy: [facility] . Interventions o Comprehensive medication review performed. o Continue current medication management strategy . Patient self care activities - Over the next 90 days, patient will: o Focus on medication adherence by filling medications appropriately  o Take medications as prescribed o Report  any questions or concerns to PharmD and/or provider(s)  Initial goal documentation       Social Hx: Born in raised in [place] a niece that works as a Software engineer with [employer] as a Psychologist, educational.  Married 20 years. 2nd marriage. 7 children and 8 grandchildren and 3 great grandchildren.  Pre-Diabetes   Will discuss at next visit.   Recent Relevant Labs: Lab Results  Component Value Date/Time   HGBA1C 6.4 09/14/2014 12:01 PM   HGBA1C 6.4 06/19/2013 08:53 AM   MICROALBUR <0.7 03/23/2015 09:58 AM   MICROALBUR 0.4 09/14/2014 12:01 PM    Patient has failed these meds in past: None noted  Patient is currently borderline controlled on the following medications: None  Plan Continue control with diet and exercise   Hypertension   CMP Latest Ref Rng & Units 10/02/2019 01/01/2019 07/22/2018  Glucose 70 - 99 mg/dL 82 119(H) 94  BUN 6 - 23 mg/dL 16 17 17   Creatinine 0.40 -  1.20 mg/dL 1.32(H) 1.08 1.23(H)  Sodium 135 - 145 mEq/L 139 140 138  Potassium 3.5 - 5.1 mEq/L 4.0 3.9 3.9  Chloride 96 - 112 mEq/L 104 103 102  CO2 19 - 32 mEq/L 30 30 31   Calcium 8.4 - 10.5 mg/dL 9.1 9.3 9.0  Total Protein 6.0 - 8.3 g/dL 7.1 7.2 7.0  Total Bilirubin 0.2 - 1.2 mg/dL 0.4 0.4 0.4  Alkaline Phos 39 - 117 U/L 74 72 69  AST 0 - 37 U/L 14 12 16   ALT 0 - 35 U/L 11 10 11    Kidney Function Lab Results  Component Value Date/Time   CREATININE 1.32 (H) 10/02/2019 11:36 AM   CREATININE 1.08 01/01/2019 08:00 AM   CREATININE 1.33 (H) 11/17/2016 04:02 PM   GFR 47.88 (L) 10/02/2019 11:36 AM   GFRNONAA 50 (L) 02/24/2015 01:45 PM   GFRAA 58 (L) 02/24/2015 01:45 PM   BP today is: Unable to assess due to phone visit   Office blood pressures are  BP Readings from Last 3 Encounters:  10/02/19 (!) 160/80  07/22/18 (!) 150/70  04/23/18 (!) 158/77   Goal BP <140/90  Patient has failed these meds in the past: None noted  Patient is currently uncontrolled on the following medications: Metoprolol tartrate 100mg  twice daily, olmesartan 40mg  daily, hctz 25mg  as needed  HCTZ - was told to take it only when her feet are swelling. Last used late February 2021  Olmesartan - Pt appears to be nonadherent. She would benefit from adherence packaging. Will offer at next visit when she has more time.  Last fill 08/20/19 for 90 [address] supply and patient reports she still as #2 remaining.   Patient checks BP at home several times per month  Patient home BP readings are ranging: 140s/70s per patient  We discussed Changing time of [place] of taking olmesartan to remember to take. Blood pressure goals.  Plan -Check blood pressure 1-2 times per week and record.  -Follow up in 2 months to reassess and offer adherence packaging if still not adherent  -Continue current medications  Future Plan -Consider changing patient to olmesartan/hctz 40/25mg  daily to reduce pill burden if daily hctz needed for BP  control  Hyperlipidemia   Lipid Panel     Component Value Date/Time   CHOL 187 10/02/2019 1136   TRIG 76.0 10/02/2019 1136   HDL 59.40 10/02/2019 1136   CHOLHDL 3 10/02/2019 1136   VLDL 15.2 10/02/2019 1136   LDLCALC 112 (H) 10/02/2019  1136   LDLDIRECT 215.7 10/18/2011 0852    LDL goal <100  The 10-year ASCVD risk score Mikey Bussing DC Jr., et al., 2013) is: 18.3%   Values used to calculate the score:     Age: 7 years     Sex: Female     Is Non-Hispanic African American: Yes     Diabetic: No     Tobacco smoker: No     Systolic Blood Pressure: 0000000 mmHg     Is BP treated: Yes     HDL Cholesterol: 59.4 mg/dL     Total Cholesterol: 187 mg/dL   Patient has failed these meds in past: atorvastatin (listed in D/C meds. No apparent reason for D/C) Patient is currently uncontrolled on the following medications: Rosuvastatin 20mg  daily, fenofibrate 160mg  daily (pt not taking)  Diet Does not eat a lot of fried food.  Eats a lot of sweets  We discussed:  How eating sweets can affect cholesterol  Plan -Continue current medications  -Reduce the amount of sweets consumed   Future Plan -Consider increasing rosuvastatin to 40mg  daily -Can consider D/C fenofibrate since patient is not taking. Better assessment after lipid panel?  Asthma / Tobacco   Last spirometry score: None noted   Eosinophil count:   Lab Results  Component Value Date/Time   EOSPCT 5.9 (H) 07/22/2018 03:52 PM  %                               Eos (Absolute):  Lab Results  Component Value Date/Time   EOSABS 0.4 07/22/2018 03:52 PM    Tobacco Status:  Social History   Tobacco Use  Smoking Status Never Smoker  Smokeless Tobacco Never Used    Patient has failed these meds in past: None noted  Patient is currently controlled on the following medications: Breo Ellipta daily by Donneta Romberg, albuterol as needed, montelukast 10mg  daily  Using maintenance inhaler regularly? Yes Frequency of rescue inhaler use:   infrequently   Has been staying in a lot and has not needed rescue inhaler. Pt states Dr. Donneta Romberg changed her Flovent to [facility].   Plan -Continue current medications  Hypothyroidism/Goiter   TSH  Date Value Ref Range Status  11/17/2016 1.24 mIU/L Final    Comment:      Reference Range   > or = 20 Years  0.40-4.50   Pregnancy Range First trimester  0.26-2.66 Second trimester 0.55-2.73 Third trimester  0.43-2.91        Patient has failed these meds in past: None noted Patient is currently controlled on the following medications: None  Plan -Continue current management    Depression/Anxiety    Patient has failed these meds in past: mirtazapine (weight gain) Patient is currently controlled on the following medications: citalopram 40mg  daily, alprazolam 0.25mg  twice daily as needed for anxiety  She is going through a lot with her mom in the hospital and her husband having health issues.  Only uses 1/2 tab of alprazolam Is linked with Psych. Last spoke to them in 2020.  PHQ9: 9 (not suicidal)  We discussed:  Reaching out to psych or PCP team if she needs further support. She verbalized understanding and agreed  Plan -Continue current medications  -Follow up with Psych as needed   GERD    Patient has failed these meds in past: None noted  Patient is currently controlled on the following medications: omeprazole 20mg  daily   Breakthrough Sx: None  Plan -Continue current medications   Allergic Rhinitis    Patient has failed these meds in past: None noted  Patient is currently controlled on the following medications: Flonase as needed  Plan -Continue current medications   Vaccines   Will discuss at next visit  Reviewed and discussed patient's vaccination history.    Immunization History  Administered Date(s) Administered  . Influenza Split 05/29/2012, 06/17/2013  . Influenza Whole 07/31/2007  . Influenza, High Dose Seasonal PF 07/02/2015,  05/24/2017, 07/16/2018  . Influenza-Unspecified 08/07/2014, 06/19/2016  . Pneumococcal Conjugate-13 07/07/2015  . Pneumococcal Polysaccharide-23 12/16/2012  . Td 04/18/2005  . Zoster 04/24/2008    Plan -Recommend patient receive Shingrix vaccine in pharmacy.    Meds to D/C from list Docusate 100mg  twice daily (no longer needed) Flovent (therapy change) getamicin (completed) Promethazine 25mg  (no longer needed) Moisture eyes (no longer needed) azelastine 0.05% (no longer needed) Multivitamin x 2 (not taking and duplicate)  Miscellaneous Meds  Vitamin B12 1064mcg daily Vitamin D Vitamin C

## 2020-01-30 NOTE — Patient Instructions (Signed)
Visit Information  Goals Addressed            This Visit's Progress   . Pharmacy Care Plan       CARE PLAN ENTRY  Current Barriers:  . Chronic Disease Management support, education, and care coordination needs related to Hypertension, Hyperlipidemia, Pre-Diabetes, Asthma, Depression/Anxiety, GERD, Allergic Rhinitis   Hypertension . Pharmacist Clinical Goal(s): o Over the next 90 days, patient will work with PharmD and providers to achieve BP goal <140/90 . Current regimen:  o Metoprolol tartrate 100mg  twice daily, olmesartan 40mg  daily, hctz 25mg  as needed . Interventions: o Requested patient to check blood pressure 1-2 times per week and record . Patient self care activities - Over the next 90 days, patient will: o Check BP 1-2 times per week, document, and provide at future appointments o Ensure daily salt intake < 2300 mg/Wendy Christensen  Hyperlipidemia . Pharmacist Clinical Goal(s): o Over the next 90 days, patient will work with PharmD and providers to achieve LDL goal <100 . Current regimen:  o Rosuvastatin 20mg  daily . Interventions: o Discussed how sweets can affect cholesterol  . Patient self care activities - Over the next 90 days, patient will: o Reduce the amount of sweets consumed  Pre-Diabetes . Pharmacist Clinical Goal(s): o Over the next 90 days, patient will work with PharmD and providers to maintain A1c goal <6.5% . Current regimen:  o Diet and exercise management   . Patient self care activities - Over the next 90 days, patient will: o Reduce the amount of sweets consumed  Depression/Anxiety . Pharmacist Clinical Goal(s) o Over the next 90 days, patient will work with PharmD and providers to reduce symptoms of depression/anxiety . Current regimen:  o Citalopram 40mg  daily, alprazolam 0.25mg  twice daily as needed for anxiety . Interventions: o Discussed importance of following up with psychiatrist/counselor or PCP team for mental health support . Patient self  care activities - Over the next 90 days, patient will: o Reach out to psychiatrist or PCP team for mental health support  Medication management . Pharmacist Clinical Goal(s): o Over the next 90 days, patient will work with PharmD and providers to achieve optimal medication adherence . Current pharmacy: [facility] . Interventions o Comprehensive medication review performed. o Continue current medication management strategy . Patient self care activities - Over the next 90 days, patient will: o Focus on medication adherence by filling medications appropriately  o Take medications as prescribed o Report any questions or concerns to PharmD and/or provider(s)  Initial goal documentation        Wendy Christensen was given information about Chronic Care Management services today including:  1. CCM service includes personalized support from designated clinical staff supervised by her physician, including individualized plan of care and coordination with other care providers 2. 24/7 contact phone numbers for assistance for urgent and routine care needs. 3. Standard insurance, coinsurance, copays and deductibles apply for chronic care management only during months in which we provide at least 20 minutes of these services. Most insurances cover these services at 100%, however patients may be responsible for any copay, coinsurance and/or deductible if applicable. This service may help you avoid the need for more expensive face-to-face services. 4. Only one practitioner may furnish and bill the service in a calendar month. 5. The patient may stop CCM services at any time (effective at the end of the month) by phone call to the office staff.  Patient agreed to services and verbal consent obtained.  The patient verbalized understanding of instructions provided today and agreed to receive a mailed copy of patient instruction and/or educational materials. Telephone follow up appointment with pharmacy  team member scheduled for: 03/31/2020  Melvenia Beam Joel Cowin, PharmD Clinical Pharmacist [facility] [phone]    How to Take Your Blood Pressure You can take your blood pressure at home with a machine. You may need to check your blood pressure at home:  To check if you have high blood pressure (hypertension).  To check your blood pressure over time.  To make sure your blood pressure medicine is working. Supplies needed: You will need a blood pressure machine, or monitor. You can buy one at a drugstore or online. When choosing one:  Choose one with an arm cuff.  Choose one that wraps around your upper arm. Only one finger should fit between your arm and the cuff.  Do not choose one that measures your blood pressure from your wrist or finger. Your doctor can suggest a monitor. How to prepare Avoid these things for 30 minutes before checking your blood pressure:  Drinking caffeine.  Drinking alcohol.  Eating.  Smoking.  Exercising. Five minutes before checking your blood pressure:  Pee.  Sit in a dining chair. Avoid sitting in a soft couch or armchair.  Be quiet. Do not talk. How to take your blood pressure Follow the instructions that came with your machine. If you have a digital blood pressure monitor, these may be the instructions: 1. Sit up straight. 2. Place your feet on the floor. Do not cross your ankles or legs. 3. Rest your left arm at the level of your heart. You may rest it on a table, desk, or chair. 4. Pull up your shirt sleeve. 5. Wrap the blood pressure cuff around the upper part of your left arm. The cuff should be 1 inch (2.5 cm) above your elbow. It is best to wrap the cuff around bare skin. 6. Fit the cuff snugly around your arm. You should be able to place only one finger between the cuff and your arm. 7. Put the cord inside the groove of your elbow. 8. Press the power button. 9. Sit quietly while the cuff fills with air  and loses air. 10. Write down the numbers on the screen. 11. Wait 2-3 minutes and then repeat steps 1-10. What do the numbers mean? Two numbers make up your blood pressure. The first number is called systolic pressure. The second is called diastolic pressure. An example of a blood pressure reading is "120 over 80" (or 120/80). If you are an adult and do not have a medical condition, use this guide to find out if your blood pressure is normal: Normal  First number: below 120.  Second number: below 80. Elevated  First number: 120-129.  Second number: below 80. Hypertension stage 1  First number: 130-139.  Second number: 80-89. Hypertension stage 2  First number: 140 or above.  Second number: 3 or above. Your blood pressure is above normal even if only the top or bottom number is above normal. Follow these instructions at home:  Check your blood pressure as often as your doctor tells you to.  Take your monitor to your next doctor's appointment. Your doctor will: ? Make sure you are using it correctly. ? Make sure it is working right.  Make sure you understand what your blood pressure numbers should be.  Tell your doctor if your medicines are causing side effects.  Contact a doctor if:  Your blood pressure keeps being high. Get help right away if:  Your first blood pressure number is higher than 180.  Your second blood pressure number is higher than 120. This information is not intended to replace advice given to you by your health care provider. Make sure you discuss any questions you have with your health care provider. Document Revised: 08/17/2017 Document Reviewed: 02/11/2016 Elsevier Patient Education  2020 [place].  Blood Pressure Record Sheet To take your blood pressure, you will need a blood pressure machine. You can buy a blood pressure machine (blood pressure monitor) at your clinic, drug store, or online. When choosing one, consider:  An automatic  monitor that has an arm cuff.  A cuff that wraps snugly around your upper arm. You should be able to fit only one finger between your arm and the cuff.  A device that stores blood pressure reading results.  Do not choose a monitor that measures your blood pressure from your wrist or finger. Follow your health care provider's instructions for how to take your blood pressure. To use this form:  Get one reading in the morning (a.m.) before you take any medicines.  Get one reading in the evening (p.m.) before supper.  Take at least 2 readings with each blood pressure check. This makes sure the results are correct. Wait 1-2 minutes between measurements.  Write down the results in the spaces on this form.  Repeat this once a week, or as told by your health care provider.  Make a follow-up appointment with your health care provider to discuss the results. Blood pressure log Date: _______________________  a.m. _____________________(1st reading) _____________________(2nd reading)  p.m. _____________________(1st reading) _____________________(2nd reading) Date: _______________________  a.m. _____________________(1st reading) _____________________(2nd reading)  p.m. _____________________(1st reading) _____________________(2nd reading) Date: _______________________  a.m. _____________________(1st reading) _____________________(2nd reading)  p.m. _____________________(1st reading) _____________________(2nd reading) Date: _______________________  a.m. _____________________(1st reading) _____________________(2nd reading)  p.m. _____________________(1st reading) _____________________(2nd reading) Date: _______________________  a.m. _____________________(1st reading) _____________________(2nd reading)  p.m. _____________________(1st reading) _____________________(2nd reading) This information is not intended to replace advice given to you by your health care provider. Make sure you  discuss any questions you have with your health care provider. Document Revised: 11/02/2017 Document Reviewed: 09/04/2017 Elsevier Patient Education  [place].

## 2020-02-11 ENCOUNTER — Other Ambulatory Visit: Payer: Self-pay

## 2020-02-11 DIAGNOSIS — I10 Essential (primary) hypertension: Secondary | ICD-10-CM

## 2020-02-11 DIAGNOSIS — E785 Hyperlipidemia, unspecified: Secondary | ICD-10-CM

## 2020-02-12 DIAGNOSIS — G4733 Obstructive sleep apnea (adult) (pediatric): Secondary | ICD-10-CM | POA: Diagnosis not present

## 2020-03-03 ENCOUNTER — Other Ambulatory Visit: Payer: Self-pay

## 2020-03-03 DIAGNOSIS — T7840XA Allergy, unspecified, initial encounter: Secondary | ICD-10-CM

## 2020-03-03 MED ORDER — MONTELUKAST SODIUM 10 MG PO TABS: 10.0000 mg | ORAL_TABLET | Freq: Every day | ORAL | 3 refills | Status: DC

## 2020-03-15 ENCOUNTER — Telehealth: Payer: Self-pay | Admitting: Family Medicine

## 2020-03-15 NOTE — Telephone Encounter (Signed)
Medication: omeprazole (PRILOSEC) 20 MG capsule       Has the patient contacted their pharmacy?  (If no, request that the patient contact the pharmacy for the refill.) (If yes, when and what did the pharmacy advise?)     Preferred Pharmacy (with phone number or street name): [facility], [address], [address], [place], [place] [address]  Phone:  [phone] Fax:  [phone]      Agent: Please be advised that RX refills may take up to 3 business days. We ask that you follow-up with your pharmacy.

## 2020-03-16 MED ORDER — OMEPRAZOLE 20 MG PO CPDR: 20.0000 mg | DELAYED_RELEASE_CAPSULE | Freq: Every day | ORAL | 0 refills | Status: DC

## 2020-03-16 NOTE — Telephone Encounter (Signed)
Refill sent.

## 2020-03-31 ENCOUNTER — Ambulatory Visit: Payer: Medicare Other | Admitting: Pharmacist

## 2020-03-31 ENCOUNTER — Other Ambulatory Visit: Payer: Self-pay

## 2020-03-31 VITALS — BP 172/84 | HR 55 | Wt 182.0 lb

## 2020-03-31 DIAGNOSIS — E785 Hyperlipidemia, unspecified: Secondary | ICD-10-CM

## 2020-03-31 DIAGNOSIS — I10 Essential (primary) hypertension: Secondary | ICD-10-CM

## 2020-03-31 NOTE — Chronic Care Management (AMB) (Signed)
Chronic Care Management Pharmacy  Name: Wendy Christensen  MRN: [id number] DOB: [birth year]  Chief Complaint/ HPI  Wendy Christensen,  74 y.o. , female presents for their Follow-Up CCM visit with the clinical pharmacist In office (originally scheduled as phone visit, but pt appeared in clinic).  PCP : Wendy Held, DO  Their chronic conditions include: Hypertension, Hyperlipidemia, Pre-Diabetes, Asthma, Depression/Anxiety, GERD, Allergic Rhinitis  Office Visits: None since last CCM visit on 01/30/20.    Consult Visit: None since last CCM visit on 01/30/20.    Medications: Outpatient Encounter Medications as of 03/31/2020  Medication Sig Note  . albuterol (PROAIR HFA) 108 (90 Base) MCG/ACT inhaler INHALE 2 PUFFS INTO THE LUNGS EVERY 4 HOURS AS NEEDED FOR WHEEZING   . ALPRAZolam (XANAX) 0.25 MG tablet Take 1 tablet (0.25 mg total) by mouth 2 (two) times daily as needed for anxiety.   . citalopram (CELEXA) 40 MG tablet Take 1 tablet (40 mg total) by mouth every morning.   . fenofibrate 160 MG tablet Take 1 tablet (160 mg total) by mouth daily. (Patient not taking: Reported on 01/30/2020)   . fluticasone (FLONASE) 50 MCG/ACT nasal spray Place 2 sprays into both nostrils as needed.   . fluticasone furoate-vilanterol (BREO ELLIPTA) 200-25 MCG/INH AEPB Inhale 1 puff into the lungs daily. Per Dr. Donneta Christensen   . hydrochlorothiazide (HYDRODIURIL) 25 MG tablet Take 1 tablet (25 mg total) by mouth daily. 1 po qd prn 01/30/2020: Uses as needed. Last used February 2021  . metoprolol tartrate (LOPRESSOR) 100 MG tablet Take 1 tablet (100 mg total) by mouth 2 (two) times daily. Please have patient to call to set up ov   . montelukast (SINGULAIR) 10 MG tablet Take 1 tablet (10 mg total) by mouth at bedtime.   Marland Kitchen olmesartan (BENICAR) 40 MG tablet Take 1 tablet (40 mg total) by mouth daily.   Marland Kitchen omeprazole (PRILOSEC) 20 MG capsule Take 1 capsule (20 mg total) by mouth daily.   . promethazine  (PHENERGAN) 25 MG tablet Take 1 tablet (25 mg total) by mouth every 8 (eight) hours as needed for nausea or vomiting. (Patient not taking: Reported on 01/30/2020)   . Propylene Glycol-Glycerin (MOISTURE EYES OP) Apply to eye.   . rosuvastatin (CRESTOR) 20 MG tablet Take 1 tablet (20 mg total) by mouth daily.   . vitamin B-12 (CYANOCOBALAMIN) 1000 MCG tablet Take 1,000 mcg by mouth daily.    No facility-administered encounter medications on file as of 03/31/2020.   SDOH Screenings   Alcohol Screen:   . Last Alcohol Screening Score (AUDIT):   Depression (PHQ2-9): Medium Risk  . PHQ-2 Score: 9  Financial Resource Strain:   . Difficulty of Paying Living Expenses:   Food Insecurity:   . Worried About Charity fundraiser in the Last Year:   . [place] in the Last Year:   Housing:   . Last Housing Risk Score:   Physical Activity:   . Days of Exercise per Week:   . Minutes of Exercise per Session:   Social Connections:   . Frequency of Communication with Friends and Family:   . Frequency of Social Gatherings with Friends and Family:   . Attends Religious Services:   . Active Member of Clubs or Organizations:   . Attends Archivist Meetings:   Marland Kitchen Marital Status:   Stress:   . Feeling of Stress :   Tobacco Use: Low Risk   . Smoking Tobacco  Use: Never Smoker  . Smokeless Tobacco Use: Never Used  Transportation Needs:   . Film/video editor (Medical):   Marland Kitchen Lack of Transportation (Non-Medical):      Current Diagnosis/Assessment:  Goals Addressed   None    Social Hx: Born in raised [place] a niece that works as a Software engineer with [employer] as a Psychologist, educational.  Married 20 years. 2nd marriage. 7 children and 8 grandchildren and 3 great grandchildren.  Pre-Diabetes   A1c goal <6.5%  Recent Relevant Labs: Lab Results  Component Value Date/Time   HGBA1C 6.4 09/14/2014 12:01 PM   HGBA1C 6.4 06/19/2013 08:53 AM   MICROALBUR <0.7 03/23/2015 09:58 AM    MICROALBUR 0.4 09/14/2014 12:01 PM    Patient has failed these meds in past: None noted  Patient is currently borderline controlled on the following medications: None  Discussed importance of limiting carbs.  She mentions that her husband has diabetes, but does not follow the recommended diet.  Discussed A1c goal.   Plan -Limit carbohydrates -Continue control with diet and exercise  -Get repeat a1c at next visit with Dr. Etter Christensen  Hypertension   CMP Latest Ref Rng & Units 10/02/2019 01/01/2019 07/22/2018  Glucose 70 - 99 mg/dL 82 119(H) 94  BUN 6 - 23 mg/dL 16 17 17   Creatinine 0.40 - 1.20 mg/dL 1.32(H) 1.08 1.23(H)  Sodium 135 - 145 mEq/L 139 140 138  Potassium 3.5 - 5.1 mEq/L 4.0 3.9 3.9  Chloride 96 - 112 mEq/L 104 103 102  CO2 19 - 32 mEq/L 30 30 31   Calcium 8.4 - 10.5 mg/dL 9.1 9.3 9.0  Total Protein 6.0 - 8.3 g/dL 7.1 7.2 7.0  Total Bilirubin 0.2 - 1.2 mg/dL 0.4 0.4 0.4  Alkaline Phos 39 - 117 U/L 74 72 69  AST 0 - 37 U/L 14 12 16   ALT 0 - 35 U/L 11 10 11    Kidney Function Lab Results  Component Value Date/Time   CREATININE 1.32 (H) 10/02/2019 11:36 AM   CREATININE 1.08 01/01/2019 08:00 AM   CREATININE 1.33 (H) 11/17/2016 04:02 PM   GFR 47.88 (L) 10/02/2019 11:36 AM   GFRNONAA 50 (L) 02/24/2015 01:45 PM   GFRAA 58 (L) 02/24/2015 01:45 PM   BP today is: Unable to assess due to phone visit   Office blood pressures are  BP Readings from Last 3 Encounters:  03/31/20 (!) 172/84  10/02/19 (!) 160/80  07/22/18 (!) 150/70   Goal BP <140/90  Patient has failed these meds in the past: None noted  Patient is currently uncontrolled on the following medications:   Metoprolol tartrate 100mg  twice daily,  Olmesartan 40mg  daily  Hctz 25mg  as needed  HCTZ - was told to take it only when her feet are swelling. Last used late February 2021  Olmesartan - Pt appears to be nonadherent. She would benefit from adherence packaging. Will offer at next visit when she has more  time.  Last fill 08/20/19 for 90 Wendy Christensen supply and patient reports she still as #2 remaining.   Patient checks BP at home several times per month  Patient home BP readings are ranging: 140s/70s per patient  We discussed Changing time of Izan Miron of taking olmesartan to remember to take. Blood pressure goals.  Update 03/31/20 Patient reports adherence. Noted her elevated BP in clinic today and strongly encouraged her to remain adherent to medication regimen.  She denies any headache, chest pain, or dizziness.  She has taken olmesartan and  metoprolol today. Has not taken hctz.  Advised patient to take hctz as soon as she gets home and report to ED if she experiences any chest pain, headache, or dizziness. Pt verbalized understanding and agreed.   She does not have BP cuff and has not been checking BP. Strongly encouraged her to purchase BP cuff, so that we can more closely follow how her BP is doing at home.   Offered patient adherence packaging, but she is considering mail order so that she can have 0 copay medications.   Plan -Purchase blood pressure cuff -Check blood pressure daily and record.  -Consider taking HCTZ daily noting elevated BP while taking olmesartan and metoprolol -Follow up with Dr. Etter Christensen and repeat labs (CMP)  Future Plan -Consider changing patient to olmesartan/hctz 40/25mg  daily to reduce pill burden if daily hctz needed for BP control (would consider replacing hctz with amlodipine, but pt already has swelling at baseline)  Hyperlipidemia   Lipid Panel     Component Value Date/Time   CHOL 187 10/02/2019 1136   TRIG 76.0 10/02/2019 1136   HDL 59.40 10/02/2019 1136   CHOLHDL 3 10/02/2019 1136   VLDL 15.2 10/02/2019 1136   LDLCALC 112 (H) 10/02/2019 1136   LDLDIRECT 215.7 10/18/2011 0852    LDL goal <100  The 10-year ASCVD risk score Mikey Bussing DC Jr., et al., 2013) is: 20.5%   Values used to calculate the score:     Age: 26 years     Sex: Female     Is Non-Hispanic  African American: Yes     Diabetic: No     Tobacco smoker: No     Systolic Blood Pressure: 127 mmHg     Is BP treated: Yes     HDL Cholesterol: 59.4 mg/dL     Total Cholesterol: 187 mg/dL   Patient has failed these meds in past: atorvastatin (listed in D/C meds. No apparent reason for D/C) Patient is currently uncontrolled on the following medications:   Rosuvastatin 20mg  daily  Diet Does not eat a lot of fried food.  Eats a lot of sweets  We discussed:  How eating sweets can affect cholesterol   Update 03/31/20 Emphasized the importance of medication adherence.   Plan -Continue current medications  -Reduce the amount of sweets consumed  -Get repeat lipid panel at next visit with Dr. Etter Christensen  Future Plan -Consider increasing rosuvastatin to 40mg  daily -Can consider D/C fenofibrate since patient is not taking. Better assessment after lipid panel?   Vaccines   Reviewed and discussed patient's vaccination history.    Immunization History  Administered Date(s) Administered  . Influenza Split 05/29/2012, 06/17/2013  . Influenza Whole 07/31/2007  . Influenza, High Dose Seasonal PF 07/02/2015, 05/24/2017, 07/16/2018  . Influenza-Unspecified 08/07/2014, 06/19/2016  . Pneumococcal Conjugate-13 07/07/2015  . Pneumococcal Polysaccharide-23 12/16/2012  . Td 04/18/2005  . Zoster 04/24/2008    Plan -Recommend patient receive Shingrix vaccine in pharmacy.    Meds to D/C from list Docusate 100mg  twice daily (no longer needed) Flovent (therapy change) getamicin (completed) Promethazine 25mg  (no longer needed) Moisture eyes (no longer needed) azelastine 0.05% (no longer needed) Multivitamin x 2 (not taking and duplicate)  Miscellaneous Meds  Vitamin B12 104mcg daily Vitamin D Vitamin C  [employer] 03/31/20: Showed patient adherence packaging and how this could assist her with keeping up with her medications. She states she will think about it.

## 2020-03-31 NOTE — Patient Instructions (Signed)
Visit Information  Goals Addressed            This Visit's Progress   . Chronic Care Management Pharmacy Care Plan       CARE PLAN ENTRY (see longitudinal plan of care for additional care plan information)  Current Barriers:  . Chronic Disease Management support, education, and care coordination needs related to Hypertension, Hyperlipidemia, Pre-Diabetes, Asthma, Depression/Anxiety, GERD, Allergic Rhinitis   Hypertension BP Readings from Last 3 Encounters:  03/31/20 (!) 172/84  10/02/19 (!) 160/80  07/22/18 (!) 150/70   . Pharmacist Clinical Goal(s): o Over the next 90 days, patient will work with PharmD and providers to achieve BP goal <140/90 . Current regimen:   Metoprolol tartrate 100mg  twice daily,  Olmesartan 40mg  daily  Hctz 25mg  as needed . Interventions: o Discussed importance of med adherence o Purchase blood pressure cuff o Consider taking hctz daily noting elevated BP o Follow up with Dr. [name] o Get repeat complete metabolic panel at next visit with Dr. [name] . Patient self care activities - Over the next 90 days, patient will: o Check BP daily, document, and provide at future appointments o Ensure daily salt intake < 2300 mg/Wendy Christensen o Maintain hypertension medication regimen.  o Purchase blood pressure cuff o Consider taking hctz daily noting elevated BP o Follow up with Dr. [name] o Get repeat complete metabolic panel at next visit with Dr. [name]  Hyperlipidemia Lab Results  Component Value Date/Time   LDLCALC 112 (H) 10/02/2019 11:36 AM   LDLDIRECT 215.7 10/18/2011 08:52 AM   . Pharmacist Clinical Goal(s): o Over the next 90 days, patient will work with PharmD and providers to achieve LDL goal < 100 . Current regimen:  o Rosuvastatin 20mg  daily . Interventions: o Emphasized the importance of med adherence o Complete lipid panel at next visit with Dr. [name] . Patient self care activities - Over the next 90 days, patient will: o Maintain cholesterol  medication regimen.   Pre-Diabetes Lab Results  Component Value Date/Time   HGBA1C 6.4 09/14/2014 12:01 PM   HGBA1C 6.4 06/19/2013 08:53 AM   . Pharmacist Clinical Goal(s): o Over the next 90 days, patient will work with PharmD and providers to maintain A1c goal <6.5% . Current regimen:  o Diet and exercise management  . Interventions: o Discussed importance of limiting carbs o Get a1c at next visit with Dr. [name] . Patient self care activities - Over the next 90 days, patient will: o Maintain a1c <6.5% o Complete a1c at next visit with Dr. [name] Health Maintenance  . Pharmacist Clinical Goal(s) o Over the next 90 days, patient will work with PharmD and providers to complete health maintenance screenings/vaccinations . Interventions: o Consider completing Shingrix vaccine series . Patient self care activities - Over the next 90 days, patient will: o Complete Shingrix Vaccine series  Medication management . Pharmacist Clinical Goal(s): o Over the next 90 days, patient will work with PharmD and providers to achieve optimal medication adherence . Current pharmacy: [facility] . Interventions o Comprehensive medication review performed. o Continue current medication management strategy . Patient self care activities - Over the next 90 days, patient will: o Focus on medication adherence by filling medications appropriately  o Take medications as prescribed o Report any questions or concerns to PharmD and/or provider(s)  Please see past updates related to this goal by clicking on the "Past Updates" button in the selected goal         The patient verbalized understanding of  instructions provided today and agreed to receive a mailed copy of patient instruction and/or educational materials.  Telephone follow up appointment with pharmacy team member scheduled for: 04/07/20  [name], PharmD Clinical Pharmacist [facility] [phone]  How to  Take Your Blood Pressure You can take your blood pressure at home with a machine. You may need to check your blood pressure at home:  To check if you have high blood pressure (hypertension).  To check your blood pressure over time.  To make sure your blood pressure medicine is working. Supplies needed: You will need a blood pressure machine, or monitor. You can buy one at a drugstore or online. When choosing one:  Choose one with an arm cuff.  Choose one that wraps around your upper arm. Only one finger should fit between your arm and the cuff.  Do not choose one that measures your blood pressure from your wrist or finger. Your doctor can suggest a monitor. How to prepare Avoid these things for 30 minutes before checking your blood pressure:  Drinking caffeine.  Drinking alcohol.  Eating.  Smoking.  Exercising. Five minutes before checking your blood pressure:  Pee.  Sit in a dining chair. Avoid sitting in a soft couch or armchair.  Be quiet. Do not talk. How to take your blood pressure Follow the instructions that came with your machine. If you have a digital blood pressure monitor, these may be the instructions: 1. Sit up straight. 2. Place your feet on the floor. Do not cross your ankles or legs. 3. Rest your left arm at the level of your heart. You may rest it on a table, desk, or chair. 4. Pull up your shirt sleeve. 5. Wrap the blood pressure cuff around the upper part of your left arm. The cuff should be 1 inch (2.5 cm) above your elbow. It is best to wrap the cuff around bare skin. 6. Fit the cuff snugly around your arm. You should be able to place only one finger between the cuff and your arm. 7. Put the cord inside the groove of your elbow. 8. Press the power button. 9. Sit quietly while the cuff fills with air and loses air. 10. Write down the numbers on the screen. 11. Wait 2-3 minutes and then repeat steps 1-10. What do the numbers mean? Two numbers make  up your blood pressure. The first number is called systolic pressure. The second is called diastolic pressure. An example of a blood pressure reading is "120 over 80" (or 120/80). If you are an adult and do not have a medical condition, use this guide to find out if your blood pressure is normal: Normal  First number: below 120.  Second number: below 80. Elevated  First number: 120-129.  Second number: below 80. Hypertension stage 1  First number: 130-139.  Second number: 80-89. Hypertension stage 2  First number: 140 or above.  Second number: 25 or above. Your blood pressure is above normal even if only the top or bottom number is above normal. Follow these instructions at home:  Check your blood pressure as often as your doctor tells you to.  Take your monitor to your next doctor's appointment. Your doctor will: ? Make sure you are using it correctly. ? Make sure it is working right.  Make sure you understand what your blood pressure numbers should be.  Tell your doctor if your medicines are causing side effects. Contact a doctor if:  Your  blood pressure keeps being high. Get help right away if:  Your first blood pressure number is higher than 180.  Your second blood pressure number is higher than 120. This information is not intended to replace advice given to you by your health care provider. Make sure you discuss any questions you have with your health care provider. Document Revised: 08/17/2017 Document Reviewed: 02/11/2016 Elsevier Patient Education  2020 [employer].

## 2020-04-01 ENCOUNTER — Other Ambulatory Visit: Payer: Self-pay

## 2020-04-01 ENCOUNTER — Telehealth: Payer: Self-pay

## 2020-04-01 MED ORDER — NONFORMULARY OR COMPOUNDED ITEM: 1 refills | Status: AC

## 2020-04-01 NOTE — Telephone Encounter (Signed)
BP rx sent to pharmacy. Requested by Melvenia Beam, verbal given by [name]

## 2020-04-07 ENCOUNTER — Ambulatory Visit: Payer: Medicare Other | Admitting: Pharmacist

## 2020-04-07 DIAGNOSIS — E785 Hyperlipidemia, unspecified: Secondary | ICD-10-CM

## 2020-04-07 DIAGNOSIS — I10 Essential (primary) hypertension: Secondary | ICD-10-CM

## 2020-04-07 NOTE — Chronic Care Management (AMB) (Signed)
Chronic Care Management Pharmacy  Name: Wendy Christensen  MRN: [id number] DOB: [birth year]  Chief Complaint/ HPI  Wendy Christensen,  74 y.o. , female presents for their Follow-Up CCM visit with the clinical pharmacist In office (originally scheduled as phone visit, but pt appeared in clinic).  PCP : [name], DO  Their chronic conditions include: Hypertension, Hyperlipidemia, Pre-Diabetes, Asthma, Depression/Anxiety, GERD, Allergic Rhinitis  Office Visits: None since last CCM visit on 03/31/20.    Consult Visit: None since last CCM visit on 03/31/20.    Medications: Outpatient Encounter Medications as of 04/07/2020  Medication Sig Note   albuterol (PROAIR HFA) 108 (90 Base) MCG/ACT inhaler INHALE 2 PUFFS INTO THE LUNGS EVERY 4 HOURS AS NEEDED FOR WHEEZING    ALPRAZolam (XANAX) 0.25 MG tablet Take 1 tablet (0.25 mg total) by mouth 2 (two) times daily as needed for anxiety.    citalopram (CELEXA) 40 MG tablet Take 1 tablet (40 mg total) by mouth every morning.    fenofibrate 160 MG tablet Take 1 tablet (160 mg total) by mouth daily. (Patient not taking: Reported on 01/30/2020)    fluticasone (FLONASE) 50 MCG/ACT nasal spray Place 2 sprays into both nostrils as needed.    fluticasone furoate-vilanterol (BREO ELLIPTA) 200-25 MCG/INH AEPB Inhale 1 puff into the lungs daily. Per Dr. Donneta Romberg    hydrochlorothiazide (HYDRODIURIL) 25 MG tablet Take 1 tablet (25 mg total) by mouth daily. 1 po qd prn 01/30/2020: Uses as needed. Last used February 2021   metoprolol tartrate (LOPRESSOR) 100 MG tablet Take 1 tablet (100 mg total) by mouth 2 (two) times daily. Please have patient to call to set up ov    montelukast (SINGULAIR) 10 MG tablet Take 1 tablet (10 mg total) by mouth at bedtime.    NONFORMULARY OR COMPOUNDED ITEM Please dispense a blood pressure cuff to patient if approved through insurance.    olmesartan (BENICAR) 40 MG tablet Take 1 tablet (40 mg total) by  mouth daily.    omeprazole (PRILOSEC) 20 MG capsule Take 1 capsule (20 mg total) by mouth daily.    promethazine (PHENERGAN) 25 MG tablet Take 1 tablet (25 mg total) by mouth every 8 (eight) hours as needed for nausea or vomiting. (Patient not taking: Reported on 01/30/2020)    Propylene Glycol-Glycerin (MOISTURE EYES OP) Apply to eye.    rosuvastatin (CRESTOR) 20 MG tablet Take 1 tablet (20 mg total) by mouth daily.    vitamin B-12 (CYANOCOBALAMIN) 1000 MCG tablet Take 1,000 mcg by mouth daily.    No facility-administered encounter medications on file as of 04/07/2020.   SDOH Screenings   Alcohol Screen:    Last Alcohol Screening Score (AUDIT):   Depression (PHQ2-9): Medium Risk   PHQ-2 Score: 9  Financial Resource Strain:    Difficulty of Paying Living Expenses:   Food Insecurity:    Worried About Charity fundraiser in the Last Year:    Arboriculturist in the Last Year:   Housing:    Last Housing Risk Score:   Physical Activity:    Days of Exercise per Week:    Minutes of Exercise per Session:   Social Connections:    Frequency of Communication with Friends and Family:    Frequency of Social Gatherings with Friends and Family:    Attends Religious Services:    Active Member of Clubs or Organizations:    Attends Archivist Meetings:    Marital Status:  Stress:    Feeling of Stress :   Tobacco Use: Low Risk    Smoking Tobacco Use: Never Smoker   Smokeless Tobacco Use: Never Used  Transportation Needs:    Film/video editor (Medical):    Lack of Transportation (Non-Medical):      Current Diagnosis/Assessment:  Goals Addressed            This Visit's Progress    Chronic Care Management Pharmacy Care Plan       CARE PLAN ENTRY (see longitudinal plan of care for additional care plan information)  Current Barriers:   Chronic Disease Management support, education, and care coordination needs related to Hypertension,  Hyperlipidemia, Pre-Diabetes, Asthma, Depression/Anxiety, GERD, Allergic Rhinitis   Hypertension BP Readings from Last 3 Encounters:  03/31/20 (!) 172/84  10/02/19 (!) 160/80  07/22/18 (!) 150/70    Pharmacist Clinical Goal(s): o Over the next 90 days, patient will work with PharmD and providers to achieve BP goal <140/90  Current regimen:   Metoprolol tartrate 100mg  twice daily,  Olmesartan 40mg  daily  Hctz 25mg  as needed  Interventions: o Discussed importance of med adherence o Purchase blood pressure cuff o Consider taking hctz daily noting elevated BP o Follow up with Dr. [name] o Get repeat complete metabolic panel at next visit with Dr. [name]  Patient self care activities - Over the next 90 days, patient will: o Check BP daily, document, and provide at future appointments o Ensure daily salt intake < 2300 mg/Wendy Christensen o Maintain hypertension medication regimen.  o Purchase blood pressure cuff o Take hctz daily noting elevated BP o Follow up with Dr. [name] o Get repeat complete metabolic panel at next visit with Dr. [name]  Hyperlipidemia Lab Results  Component Value Date/Time   LDLCALC 112 (H) 10/02/2019 11:36 AM   LDLDIRECT 215.7 10/18/2011 08:52 AM    Pharmacist Clinical Goal(s): o Over the next 90 days, patient will work with PharmD and providers to achieve LDL goal < 100  Current regimen:  o Rosuvastatin 20mg  daily  Interventions: o Emphasized the importance of med adherence o Complete lipid panel at next visit with Dr. [name]  Patient self care activities - Over the next 90 days, patient will: o Maintain cholesterol medication regimen.   Pre-Diabetes Lab Results  Component Value Date/Time   HGBA1C 6.4 09/14/2014 12:01 PM   HGBA1C 6.4 06/19/2013 08:53 AM    Pharmacist Clinical Goal(s): o Over the next 90 days, patient will work with PharmD and providers to maintain A1c goal <6.5%  Current regimen:  o Diet and exercise management    Interventions: o Discussed importance of limiting carbs o Get a1c at next visit with Dr. [name]  Patient self care activities - Over the next 90 days, patient will: o Maintain a1c <6.5% o Complete a1c at next visit with Dr. [name] Health Maintenance   Pharmacist Clinical Goal(s) o Over the next 90 days, patient will work with PharmD and providers to complete health maintenance screenings/vaccinations  Interventions: o Consider completing Shingrix vaccine series  Patient self care activities - Over the next 90 days, patient will: o Complete Shingrix Vaccine series  Medication management  Pharmacist Clinical Goal(s): o Over the next 90 days, patient will work with PharmD and providers to achieve optimal medication adherence  Current pharmacy: [facility]  Interventions o Comprehensive medication review performed. o Continue current medication management strategy  Patient self care activities - Over the next 90 days, patient will: o Focus on medication adherence by filling  medications appropriately  o Take medications as prescribed o Report any questions or concerns to PharmD and/or provider(s)  Please see past updates related to this goal by clicking on the "Past Updates" button in the selected goal        Social Hx: Born in raised in [place] on [address]. Has a niece that works as a Software engineer with [employer] as a Psychologist, educational.  Married 20 years. 2nd marriage. 7 children and 8 grandchildren and 3 great grandchildren.  Pre-Diabetes   A1c goal <6.5%  Recent Relevant Labs: Lab Results  Component Value Date/Time   HGBA1C 6.4 09/14/2014 12:01 PM   HGBA1C 6.4 06/19/2013 08:53 AM   MICROALBUR <0.7 03/23/2015 09:58 AM   MICROALBUR 0.4 09/14/2014 12:01 PM    Patient has failed these meds in past: None noted  Patient is currently borderline controlled on the following medications: None  Discussed importance of limiting carbs.  She mentions that her husband has diabetes, but  does not follow the recommended diet.  Discussed A1c goal.   Plan -Limit carbohydrates -Continue control with diet and exercise  -Get repeat a1c at next visit with Dr. [name]  Hypertension   CMP Latest Ref Rng & Units 10/02/2019 01/01/2019 07/22/2018  Glucose 70 - 99 mg/dL 82 119(H) 94  BUN 6 - 23 mg/dL 16 17 17   Creatinine 0.40 - 1.20 mg/dL 1.32(H) 1.08 1.23(H)  Sodium 135 - 145 mEq/L 139 140 138  Potassium 3.5 - 5.1 mEq/L 4.0 3.9 3.9  Chloride 96 - 112 mEq/L 104 103 102  CO2 19 - 32 mEq/L 30 30 31   Calcium 8.4 - 10.5 mg/dL 9.1 9.3 9.0  Total Protein 6.0 - 8.3 g/dL 7.1 7.2 7.0  Total Bilirubin 0.2 - 1.2 mg/dL 0.4 0.4 0.4  Alkaline Phos 39 - 117 U/L 74 72 69  AST 0 - 37 U/L 14 12 16   ALT 0 - 35 U/L 11 10 11    Kidney Function Lab Results  Component Value Date/Time   CREATININE 1.32 (H) 10/02/2019 11:36 AM   CREATININE 1.08 01/01/2019 08:00 AM   CREATININE 1.33 (H) 11/17/2016 04:02 PM   GFR 47.88 (L) 10/02/2019 11:36 AM   GFRNONAA 50 (L) 02/24/2015 01:45 PM   GFRAA 58 (L) 02/24/2015 01:45 PM   BP today is: Unable to assess due to phone visit   Office blood pressures are  BP Readings from Last 3 Encounters:  03/31/20 (!) 172/84  10/02/19 (!) 160/80  07/22/18 (!) 150/70   Goal BP <140/90  Patient has failed these meds in the past: None noted  Patient is currently uncontrolled on the following medications:   Metoprolol tartrate 100mg  twice daily,  Olmesartan 40mg  daily  Hctz 25mg  as needed  HCTZ - was told to take it only when her feet are swelling. Last used late February 2021  Olmesartan - Pt appears to be nonadherent. She would benefit from adherence packaging. Will offer at next visit when she has more time.  Last fill 08/20/19 for 90 [address] supply and patient reports she still as #2 remaining.   Patient checks BP at home several times per month  Patient home BP readings are ranging: 140s/70s per patient  We discussed Changing time of Arthurine Oleary of taking olmesartan to  remember to take. Blood pressure goals.  Update 03/31/20 Patient reports adherence. Noted her elevated BP in clinic today and strongly encouraged her to remain adherent to medication regimen.  She denies any headache, chest pain, or dizziness.  She has taken  olmesartan and metoprolol today. Has not taken hctz.  Advised patient to take hctz as soon as she gets home and report to ED if she experiences any chest pain, headache, or dizziness. Pt verbalized understanding and agreed.   She does not have BP cuff and has not been checking BP. Strongly encouraged her to purchase BP cuff, so that we can more closely follow how her BP is doing at home.   Offered patient adherence packaging, but she is considering mail order so that she can have 0 copay medications.   Update 04/07/20 Patient has not checked her BP or purchased BP cuff since last visit.   Plan -Purchase blood pressure cuff -Check blood pressure daily and record.  -Consider taking HCTZ daily noting elevated BP while taking olmesartan and metoprolol -Follow up with Dr. [name] and repeat labs (CMP)  Future Plan -Consider changing patient to olmesartan/hctz 40/25mg  daily to reduce pill burden if daily hctz needed for BP control (would consider replacing hctz with amlodipine, but pt already has swelling at baseline)  Hyperlipidemia   Lipid Panel     Component Value Date/Time   CHOL 187 10/02/2019 1136   TRIG 76.0 10/02/2019 1136   HDL 59.40 10/02/2019 1136   CHOLHDL 3 10/02/2019 1136   VLDL 15.2 10/02/2019 1136   LDLCALC 112 (H) 10/02/2019 1136   LDLDIRECT 215.7 10/18/2011 0852    LDL goal <100  The 10-year ASCVD risk score Mikey Bussing DC Jr., et al., 2013) is: 20.5%   Values used to calculate the score:     Age: 41 years     Sex: Female     Is Non-Hispanic African American: Yes     Diabetic: No     Tobacco smoker: No     Systolic Blood Pressure: 789 mmHg     Is BP treated: Yes     HDL Cholesterol: 59.4 mg/dL     Total  Cholesterol: 187 mg/dL   Patient has failed these meds in past: atorvastatin (listed in D/C meds. No apparent reason for D/C) Patient is currently uncontrolled on the following medications:   Rosuvastatin 20mg  daily  Diet Does not eat a lot of fried food.  Eats a lot of sweets  We discussed:  How eating sweets can affect cholesterol   Update 03/31/20 Emphasized the importance of medication adherence.   Update 04/07/20 No change since last week.  Plan -Continue current medications  -Reduce the amount of sweets consumed  -Get repeat lipid panel at next visit with Dr. [name]  Future Plan -Consider increasing rosuvastatin to 40mg  daily -Can consider D/C fenofibrate since patient is not taking. Better assessment after lipid panel?   Vaccines   Reviewed and discussed patient's vaccination history.    Immunization History  Administered Date(s) Administered   Influenza Split 05/29/2012, 06/17/2013   Influenza Whole 07/31/2007   Influenza, High Dose Seasonal PF 07/02/2015, 05/24/2017, 07/16/2018   Influenza-Unspecified 08/07/2014, 06/19/2016   Pneumococcal Conjugate-13 07/07/2015   Pneumococcal Polysaccharide-23 12/16/2012   Td 04/18/2005   Zoster 04/24/2008    Plan -Recommend patient receive Shingrix vaccine in pharmacy.    Meds to D/C from list Docusate 100mg  twice daily (no longer needed) Flovent (therapy change) getamicin (completed) Promethazine 25mg  (no longer needed) Moisture eyes (no longer needed) azelastine 0.05% (no longer needed) Multivitamin x 2 (not taking and duplicate)  Miscellaneous Meds  Vitamin B12 1061mcg daily Vitamin D Vitamin C  [employer] 03/31/20: Showed patient adherence packaging and how this could assist her with keeping up  with her medications. She states she will think about it.

## 2020-04-10 NOTE — Patient Instructions (Signed)
Visit Information  Goals Addressed            This Visit's Progress   . Chronic Care Management Pharmacy Care Plan       CARE PLAN ENTRY (see longitudinal plan of care for additional care plan information)  Current Barriers:  . Chronic Disease Management support, education, and care coordination needs related to Hypertension, Hyperlipidemia, Pre-Diabetes, Asthma, Depression/Anxiety, GERD, Allergic Rhinitis   Hypertension BP Readings from Last 3 Encounters:  03/31/20 (!) 172/84  10/02/19 (!) 160/80  07/22/18 (!) 150/70   . Pharmacist Clinical Goal(s): o Over the next 90 days, patient will work with PharmD and providers to achieve BP goal <140/90 . Current regimen:   Metoprolol tartrate 100mg  twice daily,  Olmesartan 40mg  daily  Hctz 25mg  as needed . Interventions: o Discussed importance of med adherence o Purchase blood pressure cuff o Consider taking hctz daily noting elevated BP o Follow up with Dr. [name] o Get repeat complete metabolic panel at next visit with Dr. [name] . Patient self care activities - Over the next 90 days, patient will: o Check BP daily, document, and provide at future appointments o Ensure daily salt intake < 2300 mg/Adabelle Griffiths o Maintain hypertension medication regimen.  o Purchase blood pressure cuff o Take hctz daily noting elevated BP o Follow up with Dr. [name] o Get repeat complete metabolic panel at next visit with Dr. [name]  Hyperlipidemia Lab Results  Component Value Date/Time   LDLCALC 112 (H) 10/02/2019 11:36 AM   LDLDIRECT 215.7 10/18/2011 08:52 AM   . Pharmacist Clinical Goal(s): o Over the next 90 days, patient will work with PharmD and providers to achieve LDL goal < 100 . Current regimen:  o Rosuvastatin 20mg  daily . Interventions: o Emphasized the importance of med adherence o Complete lipid panel at next visit with Dr. [name] . Patient self care activities - Over the next 90 days, patient will: o Maintain cholesterol medication  regimen.   Pre-Diabetes Lab Results  Component Value Date/Time   HGBA1C 6.4 09/14/2014 12:01 PM   HGBA1C 6.4 06/19/2013 08:53 AM   . Pharmacist Clinical Goal(s): o Over the next 90 days, patient will work with PharmD and providers to maintain A1c goal <6.5% . Current regimen:  o Diet and exercise management  . Interventions: o Discussed importance of limiting carbs o Get a1c at next visit with Dr. [name] . Patient self care activities - Over the next 90 days, patient will: o Maintain a1c <6.5% o Complete a1c at next visit with Dr. [name] Health Maintenance  . Pharmacist Clinical Goal(s) o Over the next 90 days, patient will work with PharmD and providers to complete health maintenance screenings/vaccinations . Interventions: o Consider completing Shingrix vaccine series . Patient self care activities - Over the next 90 days, patient will: o Complete Shingrix Vaccine series  Medication management . Pharmacist Clinical Goal(s): o Over the next 90 days, patient will work with PharmD and providers to achieve optimal medication adherence . Current pharmacy: [facility] . Interventions o Comprehensive medication review performed. o Continue current medication management strategy . Patient self care activities - Over the next 90 days, patient will: o Focus on medication adherence by filling medications appropriately  o Take medications as prescribed o Report any questions or concerns to PharmD and/or provider(s)  Please see past updates related to this goal by clicking on the "Past Updates" button in the selected goal         Patient verbalizes understanding of instructions provided  today.   The pharmacy team will reach out to the patient again over the next 14 days days.   De Blanch, PharmD Clinical Pharmacist [facility] [phone]

## 2020-04-13 ENCOUNTER — Ambulatory Visit (INDEPENDENT_AMBULATORY_CARE_PROVIDER_SITE_OTHER): Payer: Medicare Other | Admitting: Family Medicine

## 2020-04-13 ENCOUNTER — Other Ambulatory Visit: Payer: Self-pay

## 2020-04-13 ENCOUNTER — Encounter: Payer: Self-pay | Admitting: Family Medicine

## 2020-04-13 VITALS — BP 182/90 | HR 60 | Resp 17 | Ht 65.0 in | Wt 181.0 lb

## 2020-04-13 DIAGNOSIS — B356 Tinea cruris: Secondary | ICD-10-CM

## 2020-04-13 DIAGNOSIS — I10 Essential (primary) hypertension: Secondary | ICD-10-CM

## 2020-04-13 DIAGNOSIS — Z0001 Encounter for general adult medical examination with abnormal findings: Secondary | ICD-10-CM | POA: Diagnosis not present

## 2020-04-13 DIAGNOSIS — E785 Hyperlipidemia, unspecified: Secondary | ICD-10-CM | POA: Diagnosis not present

## 2020-04-13 DIAGNOSIS — Z8709 Personal history of other diseases of the respiratory system: Secondary | ICD-10-CM | POA: Insufficient documentation

## 2020-04-13 DIAGNOSIS — Z0000 Encounter for general adult medical examination without abnormal findings: Secondary | ICD-10-CM

## 2020-04-13 DIAGNOSIS — E039 Hypothyroidism, unspecified: Secondary | ICD-10-CM

## 2020-04-13 DIAGNOSIS — Z1211 Encounter for screening for malignant neoplasm of colon: Secondary | ICD-10-CM

## 2020-04-13 DIAGNOSIS — F411 Generalized anxiety disorder: Secondary | ICD-10-CM

## 2020-04-13 DIAGNOSIS — T7840XA Allergy, unspecified, initial encounter: Secondary | ICD-10-CM

## 2020-04-13 DIAGNOSIS — K219 Gastro-esophageal reflux disease without esophagitis: Secondary | ICD-10-CM

## 2020-04-13 MED ORDER — OLMESARTAN MEDOXOMIL 40 MG PO TABS: 40.0000 mg | ORAL_TABLET | Freq: Every day | ORAL | 1 refills | Status: DC

## 2020-04-13 MED ORDER — BREO ELLIPTA 200-25 MCG/INH IN AEPB: 1.0000 | INHALATION_SPRAY | Freq: Every day | RESPIRATORY_TRACT | 3 refills | Status: DC

## 2020-04-13 MED ORDER — NYSTATIN 100000 UNIT/GM EX CREA: 1.0000 "application " | TOPICAL_CREAM | Freq: Two times a day (BID) | CUTANEOUS | 0 refills | Status: DC

## 2020-04-13 MED ORDER — HYDROCHLOROTHIAZIDE 25 MG PO TABS: 25.0000 mg | ORAL_TABLET | Freq: Every day | ORAL | 1 refills | Status: DC

## 2020-04-13 MED ORDER — METOPROLOL TARTRATE 100 MG PO TABS: 100.0000 mg | ORAL_TABLET | Freq: Two times a day (BID) | ORAL | 1 refills | Status: DC

## 2020-04-13 MED ORDER — AMLODIPINE BESYLATE 5 MG PO TABS: 5.0000 mg | ORAL_TABLET | Freq: Every day | ORAL | 2 refills | Status: DC

## 2020-04-13 MED ORDER — MONTELUKAST SODIUM 10 MG PO TABS: 10.0000 mg | ORAL_TABLET | Freq: Every day | ORAL | 3 refills | Status: DC

## 2020-04-13 MED ORDER — FENOFIBRATE 160 MG PO TABS: 160.0000 mg | ORAL_TABLET | Freq: Every day | ORAL | 1 refills | Status: DC

## 2020-04-13 MED ORDER — ROSUVASTATIN CALCIUM 20 MG PO TABS: 20.0000 mg | ORAL_TABLET | Freq: Every day | ORAL | 1 refills | Status: DC

## 2020-04-13 MED ORDER — OMEPRAZOLE 20 MG PO CPDR: 20.0000 mg | DELAYED_RELEASE_CAPSULE | Freq: Every day | ORAL | 1 refills | Status: DC

## 2020-04-13 MED ORDER — ALBUTEROL SULFATE HFA 108 (90 BASE) MCG/ACT IN AERS: INHALATION_SPRAY | RESPIRATORY_TRACT | 3 refills | Status: DC

## 2020-04-13 MED ORDER — CITALOPRAM HYDROBROMIDE 40 MG PO TABS: 40.0000 mg | ORAL_TABLET | Freq: Every morning | ORAL | 3 refills | Status: DC

## 2020-04-13 NOTE — Assessment & Plan Note (Signed)
Refill inhaler

## 2020-04-13 NOTE — Patient Instructions (Signed)
Preventive Care 72 Years and Older, Female Preventive care refers to lifestyle choices and visits with your health care provider that can promote health and wellness. This includes:  A yearly physical exam. This is also called an annual well check.  Regular dental and eye exams.  Immunizations.  Screening for certain conditions.  Healthy lifestyle choices, such as diet and exercise. What can I expect for my preventive care visit? Physical exam Your health care provider will check:  Height and weight. These may be used to calculate body mass index (BMI), which is a measurement that tells if you are at a healthy weight.  Heart rate and blood pressure.  Your skin for abnormal spots. Counseling Your health care provider may ask you questions about:  Alcohol, tobacco, and drug use.  Emotional well-being.  Home and relationship well-being.  Sexual activity.  Eating habits.  History of falls.  Memory and ability to understand (cognition).  Work and work Statistician.  Pregnancy and menstrual history. What immunizations do I need?  Influenza (flu) vaccine  This is recommended every year. Tetanus, diphtheria, and pertussis (Tdap) vaccine  You may need a Td booster every 10 years. Varicella (chickenpox) vaccine  You may need this vaccine if you have not already been vaccinated. Zoster (shingles) vaccine  You may need this after age 72. Pneumococcal conjugate (PCV13) vaccine  One dose is recommended after age 72. Pneumococcal polysaccharide (PPSV23) vaccine  One dose is recommended after age 13. Measles, mumps, and rubella (MMR) vaccine  You may need at least one dose of MMR if you were born in 1957 or later. You may also need a second dose. Meningococcal conjugate (MenACWY) vaccine  You may need this if you have certain conditions. Hepatitis A vaccine  You may need this if you have certain conditions or if you travel or work in places where you may be exposed  to hepatitis A. Hepatitis B vaccine  You may need this if you have certain conditions or if you travel or work in places where you may be exposed to hepatitis B. Haemophilus influenzae type b (Hib) vaccine  You may need this if you have certain conditions. You may receive vaccines as individual doses or as more than one vaccine together in one shot (combination vaccines). Talk with your health care provider about the risks and benefits of combination vaccines. What tests do I need? Blood tests  Lipid and cholesterol levels. These may be checked every 5 years, or more frequently depending on your overall health.  Hepatitis C test.  Hepatitis B test. Screening  Lung cancer screening. You may have this screening every year starting at age 72 if you have a 30-pack-year history of smoking and currently smoke or have quit within the past 15 years.  Colorectal cancer screening. All adults should have this screening starting at age 72 and continuing until age 4. Your health care provider may recommend screening at age 64 if you are at increased risk. You will have tests every 1-10 years, depending on your results and the type of screening test.  Diabetes screening. This is done by checking your blood sugar (glucose) after you have not eaten for a while (fasting). You may have this done every 1-3 years.  Mammogram. This may be done every 1-2 years. Talk with your health care provider about how often you should have regular mammograms.  BRCA-related cancer screening. This may be done if you have a family history of breast, ovarian, tubal, or peritoneal cancers.  Other tests °· Sexually transmitted disease (STD) testing. °· Bone density scan. This is done to screen for osteoporosis. You may have this done starting at age 72. °Follow these instructions at home: °Eating and drinking °· Eat a diet that includes fresh fruits and vegetables, whole grains, lean protein, and low-fat dairy products. Limit  your intake of foods with high amounts of sugar, saturated fats, and salt. °· Take vitamin and mineral supplements as recommended by your health care provider. °· Do not drink alcohol if your health care provider tells you not to drink. °· If you drink alcohol: °? Limit how much you have to 0-1 drink a day. °? Be aware of how much alcohol is in your drink. In the [place] one drink equals one 12 oz bottle of beer (355 mL), one 5 oz glass of wine (148 mL), or one 1½ oz glass of hard liquor (44 mL). °Lifestyle °· Take daily care of your teeth and gums. °· Stay active. Exercise for at least 30 minutes on 5 or more days each week. °· Do not use any products that contain nicotine or tobacco, such as cigarettes, e-cigarettes, and chewing tobacco. If you need help quitting, ask your health care provider. °· If you are sexually active, practice safe sex. Use a condom or other form of protection in order to prevent STIs (sexually transmitted infections). °· Talk with your health care provider about taking a low-dose aspirin or statin. °What's next? °· Go to your health care provider once a year for a well check visit. °· Ask your health care provider how often you should have your eyes and teeth checked. °· Stay up to date on all vaccines. °This information is not intended to replace advice given to you by your health care provider. Make sure you discuss any questions you have with your health care provider. °Document Revised: 08/29/2018 Document Reviewed: 08/29/2018 °Elsevier Patient Education © 2020 [employer] ° °

## 2020-04-13 NOTE — Assessment & Plan Note (Signed)
Poorly controlled will alter medications, encouraged DASH diet, minimize caffeine and obtain adequate sleep. Report concerning symptoms and follow up as directed and as needed 

## 2020-04-13 NOTE — Assessment & Plan Note (Signed)
.  check labs  Con' meds

## 2020-04-13 NOTE — Assessment & Plan Note (Signed)
Encouraged heart healthy diet, increase exercise, avoid trans fats, consider a krill oil cap daily 

## 2020-04-13 NOTE — Progress Notes (Signed)
Subjective:     Wendy Christensen is a 74 y.o. female and is here for a comprehensive physical exam. The patient reports problems - bp has been elevated .  No other concerns   Social History   Socioeconomic History  . Marital status: Married    Spouse name: Not on file  . Number of children: Not on file  . Years of education: Not on file  . Highest education level: Not on file  Occupational History  . Occupation: Retired    Fish farm manager: DISABLE  Tobacco Use  . Smoking status: Never Smoker  . Smokeless tobacco: Never Used  Substance and Sexual Activity  . Alcohol use: Yes    Alcohol/week: 0.0 standard drinks    Comment: 1 beer every once in awhile   . Drug use: No  . Sexual activity: Yes    Partners: Male    Comment: Husband   Other Topics Concern  . 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 Difficulty of Paying Living Expenses:   Food Insecurity:   . Worried About Charity fundraiser in the Last Year:   . Arboriculturist in the Last Year:   Transportation Needs:   . Film/video editor (Medical):   Marland Kitchen Lack of Transportation (Non-Medical):   Physical Activity:   . Days of Exercise per Week:   . Minutes of Exercise per Session:   Stress:   . Feeling of Stress :   Social Connections:   . Frequency of Communication with Friends and Family:   . Frequency of Social Gatherings with Friends and Family:   . Attends Religious Services:   . Active Member of Clubs or Organizations:   . Attends Archivist Meetings:   Marland Kitchen Marital Status:   Intimate Partner Violence:   . Fear of Current or Ex-Partner:   . Emotionally Abused:   Marland Kitchen Physically Abused:   . Sexually Abused:    Health Maintenance  Topic Date Due  . COVID-19 Vaccine (1) Never done  . TETANUS/TDAP  04/19/2015  . COLONOSCOPY  11/13/2016  . MAMMOGRAM  11/13/2018  . INFLUENZA VACCINE  04/18/2020   . DEXA SCAN  Completed  . Hepatitis C Screening  Completed  . PNA vac Low Risk Adult  Completed    The following portions of the patient's history were reviewed and updated as appropriate:  She  has a past medical history of Allergic rhinitis, Anxiety, Asthma, intrinsic, Depression, Frequency of urination, GERD (gastroesophageal reflux disease), Hyperlipidemia, Hypertension, Kidney stone, OSA on CPAP, Thyroid goiter, Urgency of urination, and Wears glasses. She does not have any pertinent problems on file. She  has a past surgical history that includes Tubal ligation (1980's); Dilation and curettage of uterus (1990); Cystoscopy with retrograde pyelogram, ureteroscopy and stent placement (Left, 06/21/2015); Holmium laser application (Left, 06/23/2693); Stone extraction with basket (Left, 06/21/2015); Eye surgery; Cataract extraction, bilateral; and Breast biopsy. Her family history includes Asthma in her father; Breast cancer in her cousin and cousin; COPD in her father; Heart attack in her maternal aunt; Heart disease in her mother; Hyperlipidemia in her unknown relative; Hypertension in her brother, brother, mother, and unknown relative; Kidney cancer in her maternal aunt; Stroke in her brother. She  reports that she has never smoked. She has never used smokeless tobacco. She reports current alcohol use. She reports that she does  not use drugs. She has a current medication list which includes the following prescription(s): albuterol, alprazolam, citalopram, fenofibrate, fluticasone, breo ellipta, hydrochlorothiazide, metoprolol tartrate, montelukast, NONFORMULARY OR COMPOUNDED ITEM, olmesartan, omeprazole, promethazine, propylene glycol-glycerin, rosuvastatin, vitamin b-12, amlodipine, and nystatin cream. Current Outpatient Medications on File Prior to Visit  Medication Sig Dispense Refill  . ALPRAZolam (XANAX) 0.25 MG tablet Take 1 tablet (0.25 mg total) by mouth 2 (two) times daily as needed for  anxiety. 20 tablet 0  . fluticasone (FLONASE) 50 MCG/ACT nasal spray Place 2 sprays into both nostrils as needed. 16 g 3  . NONFORMULARY OR COMPOUNDED ITEM Please dispense a blood pressure cuff to patient if approved through insurance. 1 each 1  . promethazine (PHENERGAN) 25 MG tablet Take 1 tablet (25 mg total) by mouth every 8 (eight) hours as needed for nausea or vomiting. 20 tablet 0  . Propylene Glycol-Glycerin (MOISTURE EYES OP) Apply to eye.    . vitamin B-12 (CYANOCOBALAMIN) 1000 MCG tablet Take 1,000 mcg by mouth daily.     No current facility-administered medications on file prior to visit.   She has No Known Allergies..  Review of Systems Review of Systems  Constitutional: Negative for activity change, appetite change and fatigue.  HENT: Negative for hearing loss, congestion, tinnitus and ear discharge.  dentist q63m Eyes: Negative for visual disturbance (see optho q1y )  Respiratory: Negative for cough, chest tightness and shortness of breath.   Cardiovascular: Negative for chest pain, palpitations and leg swelling.  Gastrointestinal: Negative for abdominal pain, diarrhea, constipation and abdominal distention.  Genitourinary: Negative for urgency, frequency, decreased urine volume and difficulty urinating.  Musculoskeletal: Negative for back pain, arthralgias and gait problem.  Skin: Negative for color change, pallor and rash.  Neurological: Negative for dizziness, light-headedness, numbness and headaches.  Hematological: Negative for adenopathy. Does not bruise/bleed easily.  Psychiatric/Behavioral: Negative for suicidal ideas, confusion, sleep disturbance, self-injury, dysphoric mood, decreased concentration and agitation.       Objective:    BP (!) 182/90 (BP Location: Right Arm, Patient Position: Sitting, Cuff Size: Normal)   Pulse 60   Resp 17   Ht 5\' 5"  (1.651 m)   Wt 181 lb (82.1 kg)   SpO2 98%   BMI 30.12 kg/m  General appearance: alert, cooperative and  appears stated age Head: Normocephalic, without obvious abnormality, atraumatic Eyes: negative findings: lids and lashes normal, conjunctivae and sclerae normal and pupils equal, round, reactive to light and accomodation Ears: normal TM's and external ear canals both ears Neck: no adenopathy, no carotid bruit, no JVD, supple, symmetrical, trachea midline and thyroid not enlarged, symmetric, no tenderness/mass/nodules Back: symmetric, no curvature. ROM normal. No CVA tenderness. Lungs: clear to auscultation bilaterally Breasts: gyn Heart: regular rate and rhythm, S1, S2 normal, no murmur, click, rub or gallop Abdomen: soft, non-tender; bowel sounds normal; no masses,  no organomegaly Pelvic: deferred--gyn Extremities: extremities normal, atraumatic, no cyanosis or edema Pulses: 2+ and symmetric Skin: hyperpigmentation groin c/w yeast  Lymph nodes: Cervical, supraclavicular, and axillary nodes normal. Neurologic: Alert and oriented X 3, normal strength and tone. Normal symmetric reflexes. Normal coordination and gait    Assessment:    Healthy female exam.     Plan:     ghm utd Check labs  See After Visit Summary for Counseling Recommendations    1. Essential hypertension Poorly controlled will alter medications, encouraged DASH diet, minimize caffeine and obtain adequate sleep. Report concerning symptoms and follow up as directed and as needed -  amLODipine (NORVASC) 5 MG tablet; Take 1 tablet (5 mg total) by mouth daily.  Dispense: 30 tablet; Refill: 2 - hydrochlorothiazide (HYDRODIURIL) 25 MG tablet; Take 1 tablet (25 mg total) by mouth daily. 1 po qd prn  Dispense: 90 tablet; Refill: 1 - metoprolol tartrate (LOPRESSOR) 100 MG tablet; Take 1 tablet (100 mg total) by mouth 2 (two) times daily.  Dispense: 180 tablet; Refill: 1 - olmesartan (BENICAR) 40 MG tablet; Take 1 tablet (40 mg total) by mouth daily.  Dispense: 90 tablet; Refill: 1 - Lipid panel - Comprehensive metabolic  panel  2. History of asthma Stable Refill inhaler  - albuterol (PROAIR HFA) 108 (90 Base) MCG/ACT inhaler; INHALE 2 PUFFS INTO THE LUNGS EVERY 4 HOURS AS NEEDED FOR WHEEZING  Dispense: 8.5 g; Refill: 3  3. Generalized anxiety disorder Stable Refill meds - citalopram (CELEXA) 40 MG tablet; Take 1 tablet (40 mg total) by mouth every morning.  Dispense: 90 tablet; Refill: 3  4. Hyperlipidemia LDL goal <100 Tolerating statin, encouraged heart healthy diet, avoid trans fats, minimize simple carbs and saturated fats. Increase exercise as tolerated - fenofibrate 160 MG tablet; Take 1 tablet (160 mg total) by mouth daily.  Dispense: 90 tablet; Refill: 1 - rosuvastatin (CRESTOR) 20 MG tablet; Take 1 tablet (20 mg total) by mouth daily.  Dispense: 90 tablet; Refill: 1 - Lipid panel - Comprehensive metabolic panel  5. Allergy, initial encounter Stable Refill med - montelukast (SINGULAIR) 10 MG tablet; Take 1 tablet (10 mg total) by mouth at bedtime.  Dispense: 90 tablet; Refill: 3  6. Gastroesophageal reflux disease, unspecified whether esophagitis present stable - omeprazole (PRILOSEC) 20 MG capsule; Take 1 capsule (20 mg total) by mouth daily.  Dispense: 90 capsule; Refill: 1  7. Tinea cruris  - nystatin cream (MYCOSTATIN); Apply 1 application topically 2 (two) times daily.  Dispense: 30 g; Refill: 0  8. Colon cancer screening   - Ambulatory referral to Gastroenterology  9. Preventative health care See above   10. Hypothyroidism, unspecified type

## 2020-04-14 LAB — COMPREHENSIVE METABOLIC PANEL
ALT: 11 U/L (ref 0–35)
AST: 15 U/L (ref 0–37)
Albumin: 4.2 g/dL (ref 3.5–5.2)
Alkaline Phosphatase: 61 U/L (ref 39–117)
BUN: 17 mg/dL (ref 6–23)
CO2: 31 mEq/L (ref 19–32)
Calcium: 9.5 mg/dL (ref 8.4–10.5)
Chloride: 100 mEq/L (ref 96–112)
Creatinine, Ser: 1.13 mg/dL (ref 0.40–1.20)
GFR: 57.2 mL/min — ABNORMAL LOW (ref >60.00)
Glucose, Bld: 90 mg/dL (ref 70–99)
Potassium: 4.2 mEq/L (ref 3.5–5.1)
Sodium: 136 mEq/L (ref 135–145)
Total Bilirubin: 0.4 mg/dL (ref 0.2–1.2)
Total Protein: 7.4 g/dL (ref 6.0–8.3)

## 2020-04-14 LAB — LIPID PANEL
Cholesterol: 190 mg/dL (ref 0–200)
HDL: 55.2 mg/dL (ref >39.00)
LDL Cholesterol: 114 mg/dL — ABNORMAL HIGH (ref 0–99)
NonHDL: 135.09
Total CHOL/HDL Ratio: 3
Triglycerides: 105 mg/dL (ref 0.0–149.0)
VLDL: 21 mg/dL (ref 0.0–40.0)

## 2020-04-19 ENCOUNTER — Telehealth: Payer: Self-pay | Admitting: Pharmacist

## 2020-04-19 NOTE — Progress Notes (Addendum)
Chronic Care Management Pharmacy Assistant   Name: Caela Huot  MRN: [id number] DOB: 1946/06/14  Reason for Encounter: Disease State  Patient Questions: 1.  Have you seen any other providers since your last visit?   OV on 04-13-2020 with Dr. [name]. Patient presented in the office for her physical exam and reported that her BP was elevated at 182/90.   2.  Any changes in your medicines or health?    Medication changes as followed: amLODipine (NORVASC) 5 MG tablet; Take 1 tablet (5 mg total) by mouth daily.  Patient was encouraged to follow a heart healthy diet, increase exercise, avoid trans fats, consider a krill oil cap daily per Dr. [name] at the time of visit.   PCP : [name], DO  Allergies:  No Known Allergies  Medications: Outpatient Encounter Medications as of 04/19/2020  Medication Sig  . albuterol (PROAIR HFA) 108 (90 Base) MCG/ACT inhaler INHALE 2 PUFFS INTO THE LUNGS EVERY 4 HOURS AS NEEDED FOR WHEEZING  . ALPRAZolam (XANAX) 0.25 MG tablet Take 1 tablet (0.25 mg total) by mouth 2 (two) times daily as needed for anxiety.  Marland Kitchen amLODipine (NORVASC) 5 MG tablet Take 1 tablet (5 mg total) by mouth daily.  . citalopram (CELEXA) 40 MG tablet Take 1 tablet (40 mg total) by mouth every morning.  . fenofibrate 160 MG tablet Take 1 tablet (160 mg total) by mouth daily.  . fluticasone (FLONASE) 50 MCG/ACT nasal spray Place 2 sprays into both nostrils as needed.  . fluticasone furoate-vilanterol (BREO ELLIPTA) 200-25 MCG/INH AEPB Inhale 1 puff into the lungs daily. Per Dr. Donneta Romberg  . hydrochlorothiazide (HYDRODIURIL) 25 MG tablet Take 1 tablet (25 mg total) by mouth daily. 1 po qd prn  . metoprolol tartrate (LOPRESSOR) 100 MG tablet Take 1 tablet (100 mg total) by mouth 2 (two) times daily.  . montelukast (SINGULAIR) 10 MG tablet Take 1 tablet (10 mg total) by mouth at bedtime.  . NONFORMULARY OR COMPOUNDED ITEM Please dispense a blood pressure cuff to patient if  approved through insurance.  . nystatin cream (MYCOSTATIN) Apply 1 application topically 2 (two) times daily.  Marland Kitchen olmesartan (BENICAR) 40 MG tablet Take 1 tablet (40 mg total) by mouth daily.  Marland Kitchen omeprazole (PRILOSEC) 20 MG capsule Take 1 capsule (20 mg total) by mouth daily.  . promethazine (PHENERGAN) 25 MG tablet Take 1 tablet (25 mg total) by mouth every 8 (eight) hours as needed for nausea or vomiting.  Marland Kitchen Propylene Glycol-Glycerin (MOISTURE EYES OP) Apply to eye.  . rosuvastatin (CRESTOR) 20 MG tablet Take 1 tablet (20 mg total) by mouth daily.  . vitamin B-12 (CYANOCOBALAMIN) 1000 MCG tablet Take 1,000 mcg by mouth daily.   No facility-administered encounter medications on file as of 04/19/2020.    Current Diagnosis: Patient Active Problem List   Diagnosis Date Noted  . History of asthma 04/13/2020  . Preventative health care 04/13/2020  . Urinary frequency 10/05/2019  . Preop examination 07/22/2018  . Tendon tear, ankle, right, initial encounter 07/16/2018  . Candidiasis of vagina 04/23/2018  . Uterine prolapse 04/23/2018  . Mass of right axilla 04/18/2018  . Chest pain 05/25/2017  . History of colon polyps 05/25/2017  . Hypertension 07/13/2015  . Renal insufficiency 03/30/2015  . Pain in the abdomen 02/24/2015  . Intrinsic asthma 01/26/2015  . Hyperglycemia 06/19/2013  . Hypothyroidism 06/17/2013  . OSA (obstructive sleep apnea) 01/14/2013  . BACK PAIN, THORACIC REGION, LEFT 11/18/2010  . ANXIETY  DEPRESSION 05/20/2009  . SKIN TAG 04/19/2009  . POSTMENOPAUSAL STATUS 12/28/2008  . SINUSITIS- ACUTE-NOS 09/09/2008  . ANXIETY 09/01/2008  . GENERALIZED ANXIETY DISORDER 06/30/2008  . INSOMNIA 04/24/2008  . GOITER NOS 06/04/2007  . NUMBNESS, ARM 06/04/2007  . WEIGHT LOSS 06/04/2007  . Hyperlipidemia LDL goal <100 02/07/2007  . Essential hypertension 02/07/2007  . ALLERGIC RHINITIS 02/07/2007  . GERD 02/07/2007  . COLONOSCOPY, HX OF 02/07/2007  . DILATION AND CURETTAGE, HX  OF 02/07/2007    Goals Addressed   None     Reviewed chart prior to disease state call. Spoke with patient regarding BP  Recent Office Vitals: BP Readings from Last 3 Encounters:  04/13/20 (!) 182/90  03/31/20 (!) 172/84  10/02/19 (!) 160/80   Pulse Readings from Last 3 Encounters:  04/13/20 60  03/31/20 (!) 55  10/02/19 (!) 58    Wt Readings from Last 3 Encounters:  04/13/20 181 lb (82.1 kg)  03/31/20 182 lb (82.6 kg)  10/02/19 184 lb 6.4 oz (83.6 kg)     Kidney Function Lab Results  Component Value Date/Time   CREATININE 1.13 04/13/2020 02:33 PM   CREATININE 1.32 (H) 10/02/2019 11:36 AM   CREATININE 1.33 (H) 11/17/2016 04:02 PM   GFR 57.20 (L) 04/13/2020 02:33 PM   GFRNONAA 50 (L) 02/24/2015 01:45 PM   GFRAA 58 (L) 02/24/2015 01:45 PM    BMP Latest Ref Rng & Units 04/13/2020 10/02/2019 01/01/2019  Glucose 70 - 99 mg/dL 90 82 119(H)  BUN 6 - 23 mg/dL 17 16 17   Creatinine 0.40 - 1.20 mg/dL 1.13 1.32(H) 1.08  Sodium 135 - 145 mEq/L 136 139 140  Potassium 3.5 - 5.1 mEq/L 4.2 4.0 3.9  Chloride 96 - 112 mEq/L 100 104 103  CO2 19 - 32 mEq/L 31 30 30   Calcium 8.4 - 10.5 mg/dL 9.5 9.1 9.3    . Current antihypertensive regimen:  o Amlodipine 5mg  daily o HCTZ 25mg  daily o Metoprolol 100mg  twice daily o Olmesartan 40mg  daily  . How often are you checking your Blood Pressure? daily . Current home BP readings: 142/66 04-08-2020, 137/67 04-09-2020, 131/66 04-10-2020. Patient did not take her blood pressure last week, but will start back taking it this week everyday before breakfast. . What recent interventions/DTPs have been made by any provider to improve Blood Pressure control since last CPP Visit: Norvasc 5mg  tab daily was added to medication. Patient was encouraged to follow a heart healthy diet, increase exercise, avoid trans fats, consider a krill oil cap daily  . Any recent hospitalizations or ED visits since last visit with CPP? No . What diet changes have been made to  improve Blood Pressure Control?  Encouraged heart healthy diet, increase exercise, avoid trans fats, consider a krill oil cap daily. . What exercise is being done to improve your Blood Pressure Control?  o Patient declines doing any recent exercise. She stated it is difficult to get out and walk due to [place].  Adherence Review: Is the patient currently on ACE/ARB medication? Yes Does the patient have >5 day gap between last estimated fill dates? No   Lafonda Mosses, [facility] Pharmacist Assistant [phone]

## 2020-04-22 ENCOUNTER — Other Ambulatory Visit: Payer: Self-pay

## 2020-04-22 ENCOUNTER — Encounter: Payer: Self-pay | Admitting: Sports Medicine

## 2020-04-22 ENCOUNTER — Ambulatory Visit: Payer: Medicare Other | Admitting: Sports Medicine

## 2020-04-22 ENCOUNTER — Ambulatory Visit (INDEPENDENT_AMBULATORY_CARE_PROVIDER_SITE_OTHER): Payer: Medicare Other

## 2020-04-22 DIAGNOSIS — S96911S Strain of unspecified muscle and tendon at ankle and foot level, right foot, sequela: Secondary | ICD-10-CM

## 2020-04-22 DIAGNOSIS — L905 Scar conditions and fibrosis of skin: Secondary | ICD-10-CM

## 2020-04-22 DIAGNOSIS — M25571 Pain in right ankle and joints of right foot: Secondary | ICD-10-CM

## 2020-04-22 DIAGNOSIS — M779 Enthesopathy, unspecified: Secondary | ICD-10-CM

## 2020-04-22 DIAGNOSIS — Z9889 Other specified postprocedural states: Secondary | ICD-10-CM

## 2020-04-22 DIAGNOSIS — I739 Peripheral vascular disease, unspecified: Secondary | ICD-10-CM

## 2020-04-22 NOTE — Progress Notes (Signed)
Subjective: Wendy Christensen is a 74 y.o. female patient seen today in office for follow-up evaluation of right foot and ankle pain.  Patient reports the pain is low-grade 3 out of 10 with chronic swelling reports that her support sleeve seems to help the swelling but she is concerned because it has been multiple years since surgery (08/12/2018 right peroneal tendon repair with graft) and she is still dealing with swelling and some occasional pain.  Patient reports that she tends to be very careful when she is walking and standing to make sure she is not reinjuring her ankle patient denies any weakness any recent injury or trauma.  Patient denies constitutional symptoms like nausea vomiting fever chills or any other issues at this time.  Patient Active Problem List   Diagnosis Date Noted  . History of asthma 04/13/2020  . Preventative health care 04/13/2020  . Urinary frequency 10/05/2019  . Preop examination 07/22/2018  . Tendon tear, ankle, right, initial encounter 07/16/2018  . Candidiasis of vagina 04/23/2018  . Uterine prolapse 04/23/2018  . Mass of right axilla 04/18/2018  . Chest pain 05/25/2017  . History of colon polyps 05/25/2017  . Hypertension 07/13/2015  . Renal insufficiency 03/30/2015  . Pain in the abdomen 02/24/2015  . Intrinsic asthma 01/26/2015  . Hyperglycemia 06/19/2013  . Hypothyroidism 06/17/2013  . OSA (obstructive sleep apnea) 01/14/2013  . BACK PAIN, THORACIC REGION, LEFT 11/18/2010  . ANXIETY DEPRESSION 05/20/2009  . SKIN TAG 04/19/2009  . POSTMENOPAUSAL STATUS 12/28/2008  . SINUSITIS- ACUTE-NOS 09/09/2008  . ANXIETY 09/01/2008  . GENERALIZED ANXIETY DISORDER 06/30/2008  . INSOMNIA 04/24/2008  . GOITER NOS 06/04/2007  . NUMBNESS, ARM 06/04/2007  . WEIGHT LOSS 06/04/2007  . Hyperlipidemia LDL goal <100 02/07/2007  . Essential hypertension 02/07/2007  . ALLERGIC RHINITIS 02/07/2007  . GERD 02/07/2007  . COLONOSCOPY, HX OF 02/07/2007  .  DILATION AND CURETTAGE, HX OF 02/07/2007    Current Outpatient Medications on File Prior to Visit  Medication Sig Dispense Refill  . albuterol (PROAIR HFA) 108 (90 Base) MCG/ACT inhaler INHALE 2 PUFFS INTO THE LUNGS EVERY 4 HOURS AS NEEDED FOR WHEEZING 8.5 g 3  . ALPRAZolam (XANAX) 0.25 MG tablet Take 1 tablet (0.25 mg total) by mouth 2 (two) times daily as needed for anxiety. 20 tablet 0  . amLODipine (NORVASC) 5 MG tablet Take 1 tablet (5 mg total) by mouth daily. 30 tablet 2  . citalopram (CELEXA) 40 MG tablet Take 1 tablet (40 mg total) by mouth every morning. 90 tablet 3  . fenofibrate 160 MG tablet Take 1 tablet (160 mg total) by mouth daily. 90 tablet 1  . fluticasone (FLONASE) 50 MCG/ACT nasal spray Place 2 sprays into both nostrils as needed. 16 g 3  . fluticasone furoate-vilanterol (BREO ELLIPTA) 200-25 MCG/INH AEPB Inhale 1 puff into the lungs daily. Per Dr. Donneta Romberg 3 each 3  . hydrochlorothiazide (HYDRODIURIL) 25 MG tablet Take 1 tablet (25 mg total) by mouth daily. 1 po qd prn 90 tablet 1  . metoprolol tartrate (LOPRESSOR) 100 MG tablet Take 1 tablet (100 mg total) by mouth 2 (two) times daily. 180 tablet 1  . montelukast (SINGULAIR) 10 MG tablet Take 1 tablet (10 mg total) by mouth at bedtime. 90 tablet 3  . NONFORMULARY OR COMPOUNDED ITEM Please dispense a blood pressure cuff to patient if approved through insurance. 1 each 1  . nystatin cream (MYCOSTATIN) Apply 1 application topically 2 (two) times daily. 30 g 0  .  olmesartan (BENICAR) 40 MG tablet Take 1 tablet (40 mg total) by mouth daily. 90 tablet 1  . omeprazole (PRILOSEC) 20 MG capsule Take 1 capsule (20 mg total) by mouth daily. 90 capsule 1  . promethazine (PHENERGAN) 25 MG tablet Take 1 tablet (25 mg total) by mouth every 8 (eight) hours as needed for nausea or vomiting. 20 tablet 0  . Propylene Glycol-Glycerin (MOISTURE EYES OP) Apply to eye.    . rosuvastatin (CRESTOR) 20 MG tablet Take 1 tablet (20 mg total) by mouth  daily. 90 tablet 1  . vitamin B-12 (CYANOCOBALAMIN) 1000 MCG tablet Take 1,000 mcg by mouth daily.     No current facility-administered medications on file prior to visit.    No Known Allergies  Objective: There were no vitals filed for this visit.  General: No acute distress, AAOx3  Right foot: There is chronic swelling to right lateral foot and ankle with hypertrophic scar, no erythema, no warmth, no drainage, no signs of infection noted, Capillary fill time <3 seconds in all digits, gross sensation present via light touch to right foot.  Subjective sharp shooting pains to the right lateral ankle, no pain or crepitation with range of motion right foot.  No pain with calf compression.   X-rays no acute osseous findings mild to moderate soft tissue swelling noted at right lateral ankle  Assessment and Plan:  Problem List Items Addressed This Visit    None    Visit Diagnoses    Tendonitis    -  Primary   Relevant Orders   DG Ankle Complete Right (Completed)   Postoperative state       Right ankle pain, unspecified chronicity       PVD (peripheral vascular disease) (HCC)       Tendon tear, foot, right, sequela       Scar tissue          -Patient seen and evaluated -X-rays reviewed -Discussed with patient possible chronic swelling secondary to PVD versus scar tissue -Patient declines steroid injection at this time at area -Ordered musculoskeletal ultrasound to evaluate tendon structure after surgery repair to ensure that there is no other underlying pathology present -Ordered venous reflux studies to evaluate for PVD -Continue with compression sleeve to assist with edema control -Continue with good supportive shoes and activities to tolerance -May continue with Tylenol as needed for pain -Return after musculoskeletal ultrasound and venous reflux studies or sooner if problems or issues arise.  Landis Martins, DPM

## 2020-04-23 ENCOUNTER — Telehealth: Payer: Self-pay | Admitting: *Deleted

## 2020-04-23 DIAGNOSIS — Z9889 Other specified postprocedural states: Secondary | ICD-10-CM

## 2020-04-23 DIAGNOSIS — M779 Enthesopathy, unspecified: Secondary | ICD-10-CM

## 2020-04-23 DIAGNOSIS — M25571 Pain in right ankle and joints of right foot: Secondary | ICD-10-CM

## 2020-04-23 DIAGNOSIS — I739 Peripheral vascular disease, unspecified: Secondary | ICD-10-CM

## 2020-04-23 NOTE — Telephone Encounter (Signed)
Faxed [place] orders to [facility]. Faxed venous reflux orders to [facility].

## 2020-04-23 NOTE — Telephone Encounter (Signed)
-----   Message from Wendy Christensen, [place] sent at 04/22/2020 12:03 PM EDT ----- Regarding: Ultrasound and reflux study [facility] Ultrasound right lateral ankle, chronic swelling at peroneal tendon history of repair  Also get a Venous reflex study to check for reflux since swelling has been chronic on the right ankle

## 2020-05-05 DIAGNOSIS — H524 Presbyopia: Secondary | ICD-10-CM | POA: Diagnosis not present

## 2020-05-05 DIAGNOSIS — H33003 Unspecified retinal detachment with retinal break, bilateral: Secondary | ICD-10-CM | POA: Diagnosis not present

## 2020-05-11 ENCOUNTER — Ambulatory Visit (INDEPENDENT_AMBULATORY_CARE_PROVIDER_SITE_OTHER): Payer: Medicare Other | Admitting: Family Medicine

## 2020-05-11 ENCOUNTER — Other Ambulatory Visit: Payer: Self-pay

## 2020-05-11 ENCOUNTER — Encounter: Payer: Self-pay | Admitting: Family Medicine

## 2020-05-11 VITALS — BP 132/80 | HR 59 | Temp 98.9°F | Resp 18 | Ht 65.0 in | Wt 179.6 lb

## 2020-05-11 DIAGNOSIS — E785 Hyperlipidemia, unspecified: Secondary | ICD-10-CM

## 2020-05-11 DIAGNOSIS — R0600 Dyspnea, unspecified: Secondary | ICD-10-CM

## 2020-05-11 DIAGNOSIS — I10 Essential (primary) hypertension: Secondary | ICD-10-CM

## 2020-05-11 DIAGNOSIS — R0609 Other forms of dyspnea: Secondary | ICD-10-CM

## 2020-05-11 DIAGNOSIS — R5383 Other fatigue: Secondary | ICD-10-CM | POA: Diagnosis not present

## 2020-05-11 DIAGNOSIS — E539 Vitamin B deficiency, unspecified: Secondary | ICD-10-CM | POA: Diagnosis not present

## 2020-05-11 DIAGNOSIS — E559 Vitamin D deficiency, unspecified: Secondary | ICD-10-CM | POA: Diagnosis not present

## 2020-05-11 NOTE — Progress Notes (Signed)
Patient ID: Wendy Christensen, female    DOB: [birth year]  Age: 74 y.o. MRN: [id number]    Subjective:  Subjective  HPI Wendy Christensen presents for f/u bp but c/o extreme fatigue and sob with exertion.  She said her eye dr told her she should have a heart CT.  She does not know why.   We will need to get a copy of her ov from them.  No chest pain or palpitations   Symptoms x few weeks   Review of Systems  Constitutional: Positive for fatigue. Negative for appetite change, diaphoresis and unexpected weight change.  Eyes: Negative for pain, redness and visual disturbance.  Respiratory: Positive for shortness of breath. Negative for cough, chest tightness and wheezing.   Cardiovascular: Negative for chest pain, palpitations and leg swelling.  Endocrine: Negative for cold intolerance, heat intolerance, polydipsia, polyphagia and polyuria.  Genitourinary: Negative for difficulty urinating, dysuria and frequency.  Neurological: Negative for dizziness, light-headedness, numbness and headaches.    History Past Medical History:  Diagnosis Date  . Allergic rhinitis   . Anxiety   . Asthma, intrinsic   . Depression   . Frequency of urination   . GERD (gastroesophageal reflux disease)   . Hyperlipidemia   . Hypertension   . Kidney stone    Left kidney-calcium   . OSA on CPAP    PT IN PROCESS OF GETTING MACHINE SET UP  . Thyroid goiter   . Urgency of urination   . Wears glasses     She has a past surgical history that includes Tubal ligation (1980's); Dilation and curettage of uterus (1990); Cystoscopy with retrograde pyelogram, ureteroscopy and stent placement (Left, 06/21/2015); Holmium laser application (Left, 59/01/6386); Stone extraction with basket (Left, 06/21/2015); Eye surgery; Cataract extraction, bilateral; and Breast biopsy.   Her family history includes Asthma in her father; Breast cancer in her cousin and cousin; COPD in her father; Heart attack in her maternal aunt;  Heart disease in her mother; Hyperlipidemia in her unknown relative; Hypertension in her brother, brother, mother, and unknown relative; Kidney cancer in her maternal aunt; Stroke in her brother.She reports that she has never smoked. She has never used smokeless tobacco. She reports current alcohol use. She reports that she does not use drugs.  Current Outpatient Medications on File Prior to Visit  Medication Sig Dispense Refill  . albuterol (PROAIR HFA) 108 (90 Base) MCG/ACT inhaler INHALE 2 PUFFS INTO THE LUNGS EVERY 4 HOURS AS NEEDED FOR WHEEZING 8.5 g 3  . ALPRAZolam (XANAX) 0.25 MG tablet Take 1 tablet (0.25 mg total) by mouth 2 (two) times daily as needed for anxiety. 20 tablet 0  . amLODipine (NORVASC) 5 MG tablet Take 1 tablet (5 mg total) by mouth daily. 30 tablet 2  . citalopram (CELEXA) 40 MG tablet Take 1 tablet (40 mg total) by mouth every morning. 90 tablet 3  . fenofibrate 160 MG tablet Take 1 tablet (160 mg total) by mouth daily. 90 tablet 1  . fluticasone (FLONASE) 50 MCG/ACT nasal spray Place 2 sprays into both nostrils as needed. 16 g 3  . fluticasone furoate-vilanterol (BREO ELLIPTA) 200-25 MCG/INH AEPB Inhale 1 puff into the lungs daily. Per Dr. Donneta Romberg 3 each 3  . hydrochlorothiazide (HYDRODIURIL) 25 MG tablet Take 1 tablet (25 mg total) by mouth daily. 1 po qd prn 90 tablet 1  . metoprolol tartrate (LOPRESSOR) 100 MG tablet Take 1 tablet (100 mg total) by mouth 2 (two) times daily. 180 tablet  1  . montelukast (SINGULAIR) 10 MG tablet Take 1 tablet (10 mg total) by mouth at bedtime. 90 tablet 3  . NONFORMULARY OR COMPOUNDED ITEM Please dispense a blood pressure cuff to patient if approved through insurance. 1 each 1  . nystatin cream (MYCOSTATIN) Apply 1 application topically 2 (two) times daily. 30 g 0  . olmesartan (BENICAR) 40 MG tablet Take 1 tablet (40 mg total) by mouth daily. 90 tablet 1  . omeprazole (PRILOSEC) 20 MG capsule Take 1 capsule (20 mg total) by mouth daily.  90 capsule 1  . promethazine (PHENERGAN) 25 MG tablet Take 1 tablet (25 mg total) by mouth every 8 (eight) hours as needed for nausea or vomiting. 20 tablet 0  . Propylene Glycol-Glycerin (MOISTURE EYES OP) Apply to eye.    . rosuvastatin (CRESTOR) 20 MG tablet Take 1 tablet (20 mg total) by mouth daily. 90 tablet 1  . vitamin B-12 (CYANOCOBALAMIN) 1000 MCG tablet Take 1,000 mcg by mouth daily.     No current facility-administered medications on file prior to visit.     Objective:  Objective  Physical Exam Vitals and nursing note reviewed.  Constitutional:      Appearance: She is well-developed.  HENT:     Head: Normocephalic and atraumatic.  Eyes:     Conjunctiva/sclera: Conjunctivae normal.  Neck:     Thyroid: No thyromegaly.     Vascular: No carotid bruit or JVD.  Cardiovascular:     Rate and Rhythm: Normal rate and regular rhythm.     Heart sounds: Normal heart sounds. No murmur heard.   Pulmonary:     Effort: Pulmonary effort is normal. No respiratory distress.     Breath sounds: Normal breath sounds. No wheezing or rales.  Chest:     Chest wall: No tenderness.  Musculoskeletal:     Cervical back: Normal range of motion and neck supple.  Neurological:     Mental Status: She is alert and oriented to person, place, and time.    BP 132/80 (BP Location: Right Arm, Patient Position: Sitting, Cuff Size: Normal)   Pulse (!) 59   Temp 98.9 F (37.2 C) (Oral)   Resp 18   Ht 5\' 5"  (1.651 m)   Wt 179 lb 9.6 oz (81.5 kg)   SpO2 98%   BMI 29.89 kg/m  Wt Readings from Last 3 Encounters:  05/11/20 179 lb 9.6 oz (81.5 kg)  04/13/20 181 lb (82.1 kg)  03/31/20 182 lb (82.6 kg)   ekg--- no change from previous---- sinus brady  Lab Results  Component Value Date   WBC 6.1 07/22/2018   HGB 11.2 (L) 07/22/2018   HCT 34.7 (L) 07/22/2018   PLT 271.0 07/22/2018   GLUCOSE 90 04/13/2020   CHOL 190 04/13/2020   TRIG 105.0 04/13/2020   HDL 55.20 04/13/2020   LDLDIRECT 215.7  10/18/2011   LDLCALC 114 (H) 04/13/2020   ALT 11 04/13/2020   AST 15 04/13/2020   NA 136 04/13/2020   K 4.2 04/13/2020   CL 100 04/13/2020   CREATININE 1.13 04/13/2020   BUN 17 04/13/2020   CO2 31 04/13/2020   TSH 1.24 11/17/2016   HGBA1C 6.4 09/14/2014   MICROALBUR <0.7 03/23/2015    MR FOOT RIGHT WO CONTRAST  Result Date: 06/14/2018 CLINICAL DATA:  Lateral foot pain extending from the 5th metatarsal base into the heel. Patient reports 2 injuries over the last 6 months. Question stress fracture versus tendinitis. EXAM: MRI OF THE RIGHT FOREFOOT  WITHOUT CONTRAST TECHNIQUE: Multiplanar, multisequence MR imaging of the right foot was performed. No intravenous contrast was administered. COMPARISON:  Radiographs 06/07/2018 FINDINGS: Bones/Joint/Cartilage Large field-of-view imaging includes most of the foot with the exception of the distal toes. There is no evidence of acute fracture or dislocation. The alignment is normal at the Lisfranc joint. There are mild degenerative changes at the 1st metatarsophalangeal joint. No significant joint effusions. Ligaments The Lisfranc ligament is intact. Muscles and Tendons There is prominent tendinosis and partial tearing of the peroneus longus tendon where it passes under the calcaneal cuboid articulation, best seen on images 28-36 of series 9. There is associated mild tenosynovitis. These changes are distal to an os peroneum demonstrated on the patient's radiographs. No definite full-thickness tendon tear. The peroneus brevis tendon appears intact. The additional foot muscles and tendons appear intact. Soft tissues Otherwise unremarkable. IMPRESSION: 1. Distal peroneus longus tenosynovitis and partial tearing. No full-thickness tear identified. 2. No evidence of stress fracture or other significant osseous findings in the right foot. 3. Mild degenerative changes at the 1st metatarsophalangeal joint. Electronically Signed   By: Richardean Sale M.D.   On:  06/14/2018 08:58     Assessment & Plan:  Plan  I am having [name] maintain her vitamin B-12, Propylene Glycol-Glycerin (MOISTURE EYES OP), promethazine, fluticasone, ALPRAZolam, NONFORMULARY OR COMPOUNDED ITEM, amLODipine, albuterol, citalopram, fenofibrate, Breo Ellipta, hydrochlorothiazide, metoprolol tartrate, montelukast, olmesartan, omeprazole, rosuvastatin, and nystatin cream.  No orders of the defined types were placed in this encounter.   Problem List Items Addressed This Visit      Unprioritized   Essential hypertension    Well controlled, no changes to meds. Encouraged heart healthy diet such as the DASH diet and exercise as tolerated.       Relevant Orders   Vitamin D (25 hydroxy)   Comprehensive metabolic panel   TSH   Vitamin B12   CBC with Differential/Platelet   EKG 12-Lead (Completed)   Hyperlipidemia LDL goal <100    Encouraged heart healthy diet, increase exercise, avoid trans fats, consider a krill oil cap daily       Other Visit Diagnoses    Other fatigue    -  Primary   Relevant Orders   Vitamin D (25 hydroxy)   Comprehensive metabolic panel   TSH   Vitamin B12   CBC with Differential/Platelet   EKG 12-Lead (Completed)   Dyslipidemia       Relevant Orders   Comprehensive metabolic panel   Lipid panel   DOE (dyspnea on exertion)       Relevant Orders   ECHOCARDIOGRAM COMPLETE   EKG 12-Lead (Completed)      Follow-up: Return in about 3 months (around 08/11/2020), or if symptoms worsen or fail to improve.  [name], DO

## 2020-05-11 NOTE — Assessment & Plan Note (Signed)
Well controlled, no changes to meds. Encouraged heart healthy diet such as the DASH diet and exercise as tolerated.  °

## 2020-05-11 NOTE — Patient Instructions (Signed)
DASH Eating Plan DASH stands for "Dietary Approaches to Stop Hypertension." The DASH eating plan is a healthy eating plan that has been shown to reduce high blood pressure (hypertension). It may also reduce your risk for type 2 diabetes, heart disease, and stroke. The DASH eating plan may also help with weight loss. What are tips for following this plan?  General guidelines  Avoid eating more than 2,300 mg (milligrams) of salt (sodium) a day. If you have hypertension, you may need to reduce your sodium intake to 1,500 mg a day.  Limit alcohol intake to no more than 1 drink a day for nonpregnant women and 2 drinks a day for men. One drink equals 12 oz of beer, 5 oz of wine, or 1 oz of hard liquor.  Work with your health care provider to maintain a healthy body weight or to lose weight. Ask what an ideal weight is for you.  Get at least 30 minutes of exercise that causes your heart to beat faster (aerobic exercise) most days of the week. Activities may include walking, swimming, or biking.  Work with your health care provider or diet and nutrition specialist (dietitian) to adjust your eating plan to your individual calorie needs. Reading food labels   Check food labels for the amount of sodium per serving. Choose foods with less than 5 percent of the Daily Value of sodium. Generally, foods with less than 300 mg of sodium per serving fit into this eating plan.  To find whole grains, look for the word "whole" as the first word in the ingredient list. Shopping  Buy products labeled as "low-sodium" or "no salt added."  Buy fresh foods. Avoid canned foods and premade or frozen meals. Cooking  Avoid adding salt when cooking. Use salt-free seasonings or herbs instead of table salt or sea salt. Check with your health care provider or pharmacist before using salt substitutes.  Do not fry foods. Cook foods using healthy methods such as baking, boiling, grilling, and broiling instead.  Cook with  heart-healthy oils, such as olive, canola, soybean, or sunflower oil. Meal planning  Eat a balanced diet that includes: ? 5 or more servings of fruits and vegetables each day. At each meal, try to fill half of your plate with fruits and vegetables. ? Up to 6-8 servings of whole grains each day. ? Less than 6 oz of lean meat, poultry, or fish each day. A 3-oz serving of meat is about the same size as a deck of cards. One egg equals 1 oz. ? 2 servings of low-fat dairy each day. ? A serving of nuts, seeds, or beans 5 times each week. ? Heart-healthy fats. Healthy fats called Omega-3 fatty acids are found in foods such as flaxseeds and coldwater fish, like sardines, salmon, and mackerel.  Limit how much you eat of the following: ? Canned or prepackaged foods. ? Food that is high in trans fat, such as fried foods. ? Food that is high in saturated fat, such as fatty meat. ? Sweets, desserts, sugary drinks, and other foods with added sugar. ? Full-fat dairy products.  Do not salt foods before eating.  Try to eat at least 2 vegetarian meals each week.  Eat more home-cooked food and less restaurant, buffet, and fast food.  When eating at a restaurant, ask that your food be prepared with less salt or no salt, if possible. What foods are recommended? The items listed may not be a complete list. Talk with your dietitian about   what dietary choices are best for you. Grains Whole-grain or whole-wheat bread. Whole-grain or whole-wheat pasta. Brown rice. Oatmeal. Quinoa. Bulgur. Whole-grain and low-sodium cereals. Pita bread. Low-fat, low-sodium crackers. Whole-wheat flour tortillas. Vegetables Fresh or frozen vegetables (raw, steamed, roasted, or grilled). Low-sodium or reduced-sodium tomato and vegetable juice. Low-sodium or reduced-sodium tomato sauce and tomato paste. Low-sodium or reduced-sodium canned vegetables. Fruits All fresh, dried, or frozen fruit. Canned fruit in natural juice (without  added sugar). Meat and other protein foods Skinless chicken or turkey. Ground chicken or turkey. Pork with fat trimmed off. Fish and seafood. Egg whites. Dried beans, peas, or lentils. Unsalted nuts, nut butters, and seeds. Unsalted canned beans. Lean cuts of beef with fat trimmed off. Low-sodium, lean deli meat. Dairy Low-fat (1%) or fat-free (skim) milk. Fat-free, low-fat, or reduced-fat cheeses. Nonfat, low-sodium ricotta or cottage cheese. Low-fat or nonfat yogurt. Low-fat, low-sodium cheese. Fats and oils Soft margarine without trans fats. Vegetable oil. Low-fat, reduced-fat, or light mayonnaise and salad dressings (reduced-sodium). Canola, safflower, olive, soybean, and sunflower oils. Avocado. Seasoning and other foods Herbs. Spices. Seasoning mixes without salt. Unsalted popcorn and pretzels. Fat-free sweets. What foods are not recommended? The items listed may not be a complete list. Talk with your dietitian about what dietary choices are best for you. Grains Baked goods made with fat, such as croissants, muffins, or some breads. Dry pasta or rice meal packs. Vegetables Creamed or fried vegetables. Vegetables in a cheese sauce. Regular canned vegetables (not low-sodium or reduced-sodium). Regular canned tomato sauce and paste (not low-sodium or reduced-sodium). Regular tomato and vegetable juice (not low-sodium or reduced-sodium). Pickles. Olives. Fruits Canned fruit in a light or heavy syrup. Fried fruit. Fruit in cream or butter sauce. Meat and other protein foods Fatty cuts of meat. Ribs. Fried meat. Bacon. Sausage. Bologna and other processed lunch meats. Salami. Fatback. Hotdogs. Bratwurst. Salted nuts and seeds. Canned beans with added salt. Canned or smoked fish. Whole eggs or egg yolks. Chicken or turkey with skin. Dairy Whole or 2% milk, cream, and half-and-half. Whole or full-fat cream cheese. Whole-fat or sweetened yogurt. Full-fat cheese. Nondairy creamers. Whipped toppings.  Processed cheese and cheese spreads. Fats and oils Butter. Stick margarine. Lard. Shortening. Ghee. Bacon fat. Tropical oils, such as coconut, palm kernel, or palm oil. Seasoning and other foods Salted popcorn and pretzels. Onion salt, garlic salt, seasoned salt, table salt, and sea salt. Worcestershire sauce. Tartar sauce. Barbecue sauce. Teriyaki sauce. Soy sauce, including reduced-sodium. Steak sauce. Canned and packaged gravies. Fish sauce. Oyster sauce. Cocktail sauce. Horseradish that you find on the shelf. Ketchup. Mustard. Meat flavorings and tenderizers. Bouillon cubes. Hot sauce and Tabasco sauce. Premade or packaged marinades. Premade or packaged taco seasonings. Relishes. Regular salad dressings. Where to find more information:  National Heart, Lung, and Blood Institute: www.nhlbi.nih.gov  American Heart Association: www.heart.org Summary  The DASH eating plan is a healthy eating plan that has been shown to reduce high blood pressure (hypertension). It may also reduce your risk for type 2 diabetes, heart disease, and stroke.  With the DASH eating plan, you should limit salt (sodium) intake to 2,300 mg a day. If you have hypertension, you may need to reduce your sodium intake to 1,500 mg a day.  When on the DASH eating plan, aim to eat more fresh fruits and vegetables, whole grains, lean proteins, low-fat dairy, and heart-healthy fats.  Work with your health care provider or diet and nutrition specialist (dietitian) to adjust your eating plan to your   individual calorie needs. This information is not intended to replace advice given to you by your health care provider. Make sure you discuss any questions you have with your health care provider. Document Revised: 08/17/2017 Document Reviewed: 08/28/2016 Elsevier Patient Education  2020 Else[employer] Inc.  

## 2020-05-11 NOTE — Assessment & Plan Note (Signed)
Encouraged heart healthy diet, increase exercise, avoid trans fats, consider a krill oil cap daily 

## 2020-05-12 LAB — COMPREHENSIVE METABOLIC PANEL
AG Ratio: 1.4 (calc) (ref 1.0–2.5)
ALT: 11 U/L (ref 6–29)
AST: 17 U/L (ref 10–35)
Albumin: 4.3 g/dL (ref 3.6–5.1)
Alkaline phosphatase (APISO): 60 U/L (ref 37–153)
BUN/Creatinine Ratio: 15 (calc) (ref 6–22)
BUN: 24 mg/dL (ref 7–25)
CO2: 26 mmol/L (ref 20–32)
Calcium: 9.4 mg/dL (ref 8.6–10.4)
Chloride: 103 mmol/L (ref 98–110)
Creat: 1.56 mg/dL — ABNORMAL HIGH (ref 0.60–0.93)
Globulin: 3.1 g/dL (calc) (ref 1.9–3.7)
Glucose, Bld: 89 mg/dL (ref 65–99)
Potassium: 4.7 mmol/L (ref 3.5–5.3)
Sodium: 138 mmol/L (ref 135–146)
Total Bilirubin: 0.4 mg/dL (ref 0.2–1.2)
Total Protein: 7.4 g/dL (ref 6.1–8.1)

## 2020-05-12 LAB — CBC WITH DIFFERENTIAL/PLATELET
Absolute Monocytes: 511 cells/uL (ref 200–950)
Basophils Absolute: 28 cells/uL (ref 0–200)
Basophils Relative: 0.4 %
Eosinophils Absolute: 371 cells/uL (ref 15–500)
Eosinophils Relative: 5.3 %
HCT: 35 % (ref 35.0–45.0)
Hemoglobin: 11.3 g/dL — ABNORMAL LOW (ref 11.7–15.5)
Lymphs Abs: 1799 cells/uL (ref 850–3900)
MCH: 27.3 pg (ref 27.0–33.0)
MCHC: 32.3 g/dL (ref 32.0–36.0)
MCV: 84.5 fL (ref 80.0–100.0)
MPV: 10.1 fL (ref 7.5–12.5)
Monocytes Relative: 7.3 %
Neutro Abs: 4291 cells/uL (ref 1500–7800)
Neutrophils Relative %: 61.3 %
Platelets: 315 10*3/uL (ref 140–400)
RBC: 4.14 10*6/uL (ref 3.80–5.10)
RDW: 12.6 % (ref 11.0–15.0)
Total Lymphocyte: 25.7 %
WBC: 7 10*3/uL (ref 3.8–10.8)

## 2020-05-12 LAB — LIPID PANEL
Cholesterol: 172 mg/dL (ref <200)
HDL: 58 mg/dL (ref >=50)
LDL Cholesterol (Calc): 97 mg/dL (calc)
Non-HDL Cholesterol (Calc): 114 mg/dL (calc) (ref <130)
Total CHOL/HDL Ratio: 3 (calc) (ref <5.0)
Triglycerides: 78 mg/dL (ref <150)

## 2020-05-12 LAB — TSH: TSH: 1.53 mIU/L (ref 0.40–4.50)

## 2020-05-12 LAB — VITAMIN D 25 HYDROXY (VIT D DEFICIENCY, FRACTURES): Vit D, 25-Hydroxy: 47 ng/mL (ref 30–100)

## 2020-05-12 LAB — VITAMIN B12: Vitamin B-12: 1944 pg/mL — ABNORMAL HIGH (ref 200–1100)

## 2020-05-13 ENCOUNTER — Other Ambulatory Visit: Payer: Medicare Other

## 2020-05-19 ENCOUNTER — Ambulatory Visit
Admission: RE | Admit: 2020-05-19 | Discharge: 2020-05-19 | Disposition: A | Discharge status: Home / Self Care | Payer: Medicare Other | Source: Ambulatory Visit | Attending: Sports Medicine | Admitting: Sports Medicine

## 2020-05-19 DIAGNOSIS — M7989 Other specified soft tissue disorders: Secondary | ICD-10-CM | POA: Diagnosis not present

## 2020-05-25 ENCOUNTER — Ambulatory Visit (HOSPITAL_COMMUNITY)
Admission: RE | Admit: 2020-05-25 | Discharge: 2020-05-25 | Disposition: A | Discharge status: Home / Self Care | Payer: Medicare Other | Source: Ambulatory Visit | Attending: Cardiovascular Disease | Admitting: Cardiovascular Disease

## 2020-05-25 ENCOUNTER — Other Ambulatory Visit: Payer: Self-pay

## 2020-05-25 DIAGNOSIS — R600 Localized edema: Secondary | ICD-10-CM

## 2020-05-25 DIAGNOSIS — I739 Peripheral vascular disease, unspecified: Secondary | ICD-10-CM | POA: Diagnosis not present

## 2020-05-31 ENCOUNTER — Ambulatory Visit: Payer: Medicare Other | Admitting: Pharmacist

## 2020-05-31 DIAGNOSIS — I10 Essential (primary) hypertension: Secondary | ICD-10-CM

## 2020-05-31 DIAGNOSIS — R739 Hyperglycemia, unspecified: Secondary | ICD-10-CM

## 2020-05-31 DIAGNOSIS — E785 Hyperlipidemia, unspecified: Secondary | ICD-10-CM

## 2020-05-31 NOTE — Patient Instructions (Signed)
Visit Information  Goals Addressed            This Visit's Progress   . Chronic Care Management Pharmacy Care Plan       CARE PLAN ENTRY (see longitudinal plan of care for additional care plan information)  Current Barriers:  . Chronic Disease Management support, education, and care coordination needs related to Hypertension, Hyperlipidemia, Pre-Diabetes, Asthma, Depression/Anxiety, GERD, Allergic Rhinitis   Hypertension BP Readings from Last 3 Encounters:  05/11/20 132/80  04/13/20 (!) 182/90  03/31/20 (!) 172/84   . Pharmacist Clinical Goal(s): o Over the next 90 days, patient will work with PharmD and providers to maintain BP goal <140/90 . Current regimen:   Metoprolol tartrate 100mg  twice daily,  Olmesartan 40mg  daily  Hctz 25mg  as needed  Amlodipine 5mg  daily . Interventions: o Discussed importance of med adherence o Purchase blood pressure cuff (completed) o Follow up with Dr. [name] (completed) o Get repeat complete metabolic panel at next visit with Dr. [name] (completed) . Patient self care activities - Over the next 90 days, patient will: o Check BP at least once per week, document, and provide at future appointments o Ensure daily salt intake < 2300 mg/Nijah Orlich o Maintain hypertension medication regimen.   Hyperlipidemia Lab Results  Component Value Date/Time   LDLCALC 97 05/11/2020 02:34 PM   LDLDIRECT 215.7 10/18/2011 08:52 AM   . Pharmacist Clinical Goal(s): o Over the next 90 days, patient will work with PharmD and providers to maintain LDL goal < 100 . Current regimen:  o Rosuvastatin 40mg  daily . Interventions: o Emphasized the importance of med adherence o Complete lipid panel at next visit with Dr. [name] . Patient self care activities - Over the next 90 days, patient will: o Maintain cholesterol medication regimen.   Pre-Diabetes Lab Results  Component Value Date/Time   HGBA1C 6.4 09/14/2014 12:01 PM   HGBA1C 6.4 06/19/2013 08:53 AM    . Pharmacist Clinical Goal(s): o Over the next 90 days, patient will work with PharmD and providers to maintain A1c goal <6.5% . Current regimen:  o Diet and exercise management  . Interventions: o Discussed importance of limiting carbs o Get a1c at next visit with Dr. [name] . Patient self care activities - Over the next 90 days, patient will: o Maintain a1c <6.5% o Complete a1c at next visit with Dr. [name] Health Maintenance  . Pharmacist Clinical Goal(s) o Over the next 90 days, patient will work with PharmD and providers to complete health maintenance screenings/vaccinations . Interventions: o Consider completing Shingrix vaccine series . Patient self care activities - Over the next 90 days, patient will: o Complete Shingrix Vaccine series  Medication management . Pharmacist Clinical Goal(s): o Over the next 90 days, patient will work with PharmD and providers to achieve optimal medication adherence . Current pharmacy: [facility] . Interventions o Comprehensive medication review performed. o Continue current medication management strategy . Patient self care activities - Over the next 90 days, patient will: o Focus on medication adherence by filling medications appropriately  o Take medications as prescribed o Report any questions or concerns to PharmD and/or provider(s)  Please see past updates related to this goal by clicking on the "Past Updates" button in the selected goal         The patient verbalized understanding of instructions provided today and agreed to receive a mailed copy of patient instruction and/or educational materials.  Telephone follow up appointment with pharmacy team member scheduled for: 08/30/2020  De Blanch, PharmD Clinical Pharmacist [facility] [phone]

## 2020-05-31 NOTE — Chronic Care Management (AMB) (Signed)
Chronic Care Management Pharmacy  Name: Wendy Christensen  MRN: [id number] DOB: 1946/08/20  Chief Complaint/ HPI  Wendy Christensen,  74 y.o. , female presents for their Follow-Up CCM visit with the clinical pharmacist via telephone due to COVID-19 Pandemic   PCP : [name], DO  Their chronic conditions include: Hypertension, Hyperlipidemia, Pre-Diabetes, Asthma, Depression/Anxiety, GERD, Allergic Rhinitis  Office Visits: 05/11/20: Visit w/ Dr. [name] - HTN F/U and pt reported extreme fatigue.  04/13/20: Visit w/ Dr. [name] - HTN. Prescribed amlodipine 5mg  daily, HCTZ 25mg  daily, metoprolol tartrate 100mg  BID, olmesartan 40mg  daily. Referral to gastro for colonoscopy. Rosuvastatin increased to 40mg  daily.  Consult Visit: 04/22/20: Podiatry visit w/ Dr. Cannon Kettle - Wendy Christensen reviewed. Pt declines steroid. Continue compression sleeve, tylenol, musculoskeletal ultrasound and venous reflux studies  Medications: Outpatient Encounter Medications as of 05/31/2020  Medication Sig   albuterol (PROAIR HFA) 108 (90 Base) MCG/ACT inhaler INHALE 2 PUFFS INTO THE LUNGS EVERY 4 HOURS AS NEEDED FOR WHEEZING   ALPRAZolam (XANAX) 0.25 MG tablet Take 1 tablet (0.25 mg total) by mouth 2 (two) times daily as needed for anxiety.   amLODipine (NORVASC) 5 MG tablet Take 1 tablet (5 mg total) by mouth daily.   citalopram (CELEXA) 40 MG tablet Take 1 tablet (40 mg total) by mouth every morning.   fenofibrate 160 MG tablet Take 1 tablet (160 mg total) by mouth daily.   fluticasone (FLONASE) 50 MCG/ACT nasal spray Place 2 sprays into both nostrils as needed.   fluticasone furoate-vilanterol (BREO ELLIPTA) 200-25 MCG/INH AEPB Inhale 1 puff into the lungs daily. Per Dr. Donneta Romberg   hydrochlorothiazide (HYDRODIURIL) 25 MG tablet Take 1 tablet (25 mg total) by mouth daily. 1 po qd prn   metoprolol tartrate (LOPRESSOR) 100 MG tablet Take 1 tablet (100 mg total) by mouth 2 (two) times daily.    montelukast (SINGULAIR) 10 MG tablet Take 1 tablet (10 mg total) by mouth at bedtime.   NONFORMULARY OR COMPOUNDED ITEM Please dispense a blood pressure cuff to patient if approved through insurance.   nystatin cream (MYCOSTATIN) Apply 1 application topically 2 (two) times daily.   olmesartan (BENICAR) 40 MG tablet Take 1 tablet (40 mg total) by mouth daily.   omeprazole (PRILOSEC) 20 MG capsule Take 1 capsule (20 mg total) by mouth daily.   promethazine (PHENERGAN) 25 MG tablet Take 1 tablet (25 mg total) by mouth every 8 (eight) hours as needed for nausea or vomiting.   Propylene Glycol-Glycerin (MOISTURE EYES OP) Apply to eye.   rosuvastatin (CRESTOR) 20 MG tablet Take 1 tablet (20 mg total) by mouth daily. (Patient taking differently: Take 40 mg by mouth daily. Now has 40mg  per 04/13/20 visit/labs and per patient)   vitamin B-12 (CYANOCOBALAMIN) 1000 MCG tablet Take 1,000 mcg by mouth daily.   No facility-administered encounter medications on file as of 05/31/2020.   SDOH Screenings   Alcohol Screen:    Last Alcohol Screening Score (AUDIT): Not on file  Depression (PHQ2-9): Medium Risk   PHQ-2 Score: 9  Financial Resource Strain: Low Risk    Difficulty of Paying Living Expenses: Not very hard  Food Insecurity:    Worried About Charity fundraiser in the Last Year: Not on file   [employer] of Food in the Last Year: Not on file  Housing:    Last Housing Risk Score: Not on file  Physical Activity:    Days of Exercise per Week: Not on file  Minutes of Exercise per Session: Not on file  Social Connections:    Frequency of Communication with Friends and Family: Not on file   Frequency of Social Gatherings with Friends and Family: Not on file   Attends Religious Services: Not on file   Active Member of Clubs or Organizations: Not on file   Attends Archivist Meetings: Not on file   Marital Status: Not on file  Stress:    Feeling of Stress : Not on file   Tobacco Use: Low Risk    Smoking Tobacco Use: Never Smoker   Smokeless Tobacco Use: Never Used  Transportation Needs:    Film/video editor (Medical): Not on file   Lack of Transportation (Non-Medical): Not on file     Current Diagnosis/Assessment:  Goals Addressed            This Visit's Progress    Chronic Care Management Pharmacy Care Plan       CARE PLAN ENTRY (see longitudinal plan of care for additional care plan information)  Current Barriers:   Chronic Disease Management support, education, and care coordination needs related to Hypertension, Hyperlipidemia, Pre-Diabetes, Asthma, Depression/Anxiety, GERD, Allergic Rhinitis   Hypertension BP Readings from Last 3 Encounters:  05/11/20 132/80  04/13/20 (!) 182/90  03/31/20 (!) 172/84    Pharmacist Clinical Goal(s): o Over the next 90 days, patient will work with PharmD and providers to maintain BP goal <140/90  Current regimen:   Metoprolol tartrate 100mg  twice daily,  Olmesartan 40mg  daily  Hctz 25mg  as needed  Amlodipine 5mg  daily  Interventions: o Discussed importance of med adherence o Purchase blood pressure cuff (completed) o Follow up with Dr. [name] (completed) o Get repeat complete metabolic panel at next visit with Dr. [name] (completed)  Patient self care activities - Over the next 90 days, patient will: o Check BP at least once per week, document, and provide at future appointments o Ensure daily salt intake < 2300 mg/Asako Saliba o Maintain hypertension medication regimen.   Hyperlipidemia Lab Results  Component Value Date/Time   LDLCALC 97 05/11/2020 02:34 PM   LDLDIRECT 215.7 10/18/2011 08:52 AM    Pharmacist Clinical Goal(s): o Over the next 90 days, patient will work with PharmD and providers to maintain LDL goal < 100  Current regimen:  o Rosuvastatin 40mg  daily  Interventions: o Emphasized the importance of med adherence o Complete lipid panel at next visit with Dr.  [name]  Patient self care activities - Over the next 90 days, patient will: o Maintain cholesterol medication regimen.   Pre-Diabetes Lab Results  Component Value Date/Time   HGBA1C 6.4 09/14/2014 12:01 PM   HGBA1C 6.4 06/19/2013 08:53 AM    Pharmacist Clinical Goal(s): o Over the next 90 days, patient will work with PharmD and providers to maintain A1c goal <6.5%  Current regimen:  o Diet and exercise management   Interventions: o Discussed importance of limiting carbs o Get a1c at next visit with Dr. [name]  Patient self care activities - Over the next 90 days, patient will: o Maintain a1c <6.5% o Complete a1c at next visit with Dr. [name] Health Maintenance   Pharmacist Clinical Goal(s) o Over the next 90 days, patient will work with PharmD and providers to complete health maintenance screenings/vaccinations  Interventions: o Consider completing Shingrix vaccine series  Patient self care activities - Over the next 90 days, patient will: o Complete Shingrix Vaccine series  Medication management  Pharmacist Clinical Goal(s): o Over the next  90 days, patient will work with PharmD and providers to achieve optimal medication adherence  Current pharmacy: [facility]  Interventions o Comprehensive medication review performed. o Continue current medication management strategy  Patient self care activities - Over the next 90 days, patient will: o Focus on medication adherence by filling medications appropriately  o Take medications as prescribed o Report any questions or concerns to PharmD and/or provider(s)  Please see past updates related to this goal by clicking on the "Past Updates" button in the selected goal        Social Hx: Born in raised in [place] on [address]. Has a niece that works as a Software engineer with [employer] as a Psychologist, educational.  Married 20 years. 2nd marriage. 7 children and 8 grandchildren and 3 great grandchildren.  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 She plans to reduce the amount of time she baby sits.  Pre-Diabetes   A1c goal <6.5%  Recent Relevant Labs: Lab Results  Component Value Date/Time   HGBA1C 6.4 09/14/2014 12:01 PM   HGBA1C 6.4 06/19/2013 08:53 AM   MICROALBUR <0.7 03/23/2015 09:58 AM   MICROALBUR 0.4 09/14/2014 12:01 PM    Patient has failed these meds in past: None noted  Patient is currently borderline controlled on the following medications: None  Discussed importance of limiting carbs.  She mentions that her husband has diabetes, but does not follow the recommended diet.  Discussed A1c goal.   Update  Patient would still benefit from updated a1c.   Plan -Limit carbohydrates -Continue control with diet and exercise  -Get repeat a1c at next visit with Dr. [name]  Hypertension   Goal BP <140/90  CMP Latest Ref Rng & Units 05/11/2020 04/13/2020 10/02/2019  Glucose 65 - 99 mg/dL 89 90 82  BUN 7 - 25 mg/dL 24 17 16   Creatinine 0.60 - 0.93 mg/dL 1.56(H) 1.13 1.32(H)  Sodium 135 - 146 mmol/L 138 136 139  Potassium 3.5 - 5.3 mmol/L 4.7 4.2 4.0  Chloride 98 - 110 mmol/L 103 100 104  CO2 20 - 32 mmol/L 26 31 30   Calcium 8.6 - 10.4 mg/dL 9.4 9.5 9.1  Total Protein 6.1 - 8.1 g/dL 7.4 7.4 7.1  Total Bilirubin 0.2 - 1.2 mg/dL 0.4 0.4 0.4  Alkaline Phos 39 - 117 U/L - 61 74  AST 10 - 35 U/L 17 15 14   ALT 6 - 29 U/L 11 11 11    Kidney Function Lab Results  Component Value Date/Time   CREATININE 1.56 (H) 05/11/2020 02:34 PM   CREATININE 1.13 04/13/2020 02:33 PM   CREATININE 1.32 (H) 10/02/2019 11:36 AM   CREATININE 1.33 (H) 11/17/2016 04:02 PM   GFR 57.20 (L) 04/13/2020 02:33 PM   GFRNONAA 50 (L) 02/24/2015 01:45 PM   GFRAA 58 (L) 02/24/2015 01:45 PM   Office blood pressures are  BP Readings from Last 3 Encounters:  05/11/20 132/80  04/13/20 (!) 182/90  03/31/20 (!) 172/84   Patient has failed these meds in the past: None noted  Patient is  currently uncontrolled on the following medications:   Metoprolol tartrate 100mg  twice daily,  Olmesartan 40mg  daily  Hctz 25mg  as needed  Amlodipine 5mg  daily  HCTZ - was told to take it only when her feet are swelling. Last used late February 2021  Olmesartan - Pt appears to be nonadherent. She would benefit from adherence packaging. Will offer at next visit when she  has more time.  Last fill 08/20/19 for 90 Lynanne Delgreco supply and patient reports she still as #2 remaining.   Patient checks BP at home several times per month  Patient home BP readings are ranging: 140s/70s per patient  We discussed Changing time of Maddyson Keil of taking olmesartan to remember to take. Blood pressure goals.  Update 03/31/20 Patient reports adherence. Noted her elevated BP in clinic today and strongly encouraged her to remain adherent to medication regimen.  She denies any headache, chest pain, or dizziness.  She has taken olmesartan and metoprolol today. Has not taken hctz.  Advised patient to take hctz as soon as she gets home and report to ED if she experiences any chest pain, headache, or dizziness. Pt verbalized understanding and agreed.   She does not have BP cuff and has not been checking BP. Strongly encouraged her to purchase BP cuff, so that we can more closely follow how her BP is doing at home.   Offered patient adherence packaging, but she is considering mail order so that she can have 0 copay medications.   Update 04/07/20 Patient has not checked her BP or purchased BP cuff since last visit.   Update 05/31/20 She has purchased a BP cuff, but has not started checking her BP. Noted her last clinic BP was lower than previous readings. Strongly encouraged patient to check her BP at least once per week. She states she is agreeable to this. Denies headaches, chest pain, or dizziness.  She is not low on either amlodipine or olmesartan.  Discussed possibility of getting combo pill of olmesartan and  amlodipine. She would be interested in this after she uses her remaining supply  Plan -Continue current medications -Check BP at least once per week.  Future Plan -Consider changing patient to olmesartan/amlodipine 40/5mg  daily to reduce pill burden  Hyperlipidemia   Lipid Panel     Component Value Date/Time   CHOL 172 05/11/2020 1434   TRIG 78 05/11/2020 1434   HDL 58 05/11/2020 1434   CHOLHDL 3.0 05/11/2020 1434   VLDL 21.0 04/13/2020 1433   LDLCALC 97 05/11/2020 1434   LDLDIRECT 215.7 10/18/2011 0852    LDL goal <100  The 10-year ASCVD risk score Mikey Bussing DC Jr., et al., 2013) is: 12.3%   Values used to calculate the score:     Age: 68 years     Sex: Female     Is Non-Hispanic African American: Yes     Diabetic: No     Tobacco smoker: No     Systolic Blood Pressure: 809 mmHg     Is BP treated: Yes     HDL Cholesterol: 58 mg/dL     Total Cholesterol: 172 mg/dL   Patient has failed these meds in past: atorvastatin (listed in D/C meds. No apparent reason for D/C) Patient is currently uncontrolled on the following medications:   Rosuvastatin 40mg  daily  Fenofibrate 160mg  daily  Diet Does not eat a lot of fried food.  Eats a lot of sweets  We discussed:  How eating sweets can affect cholesterol   Update 03/31/20 Emphasized the importance of medication adherence.   Update 04/07/20 No change since last week.  Update 05/31/20 LDL to goal! Congratulated patient on this.   Plan -Continue current medications   Vaccines   Reviewed and discussed patient's vaccination history.    Immunization History  Administered Date(s) Administered   Influenza Split 05/29/2012, 06/17/2013   Influenza Whole 07/31/2007   Influenza, High Dose Seasonal  PF 07/02/2015, 05/24/2017, 07/16/2018   Influenza-Unspecified 08/07/2014, 06/19/2016   PFIZER SARS-COV-2 Vaccination 11/11/2019, 12/02/2019   Pneumococcal Conjugate-13 07/07/2015   Pneumococcal Polysaccharide-23  12/16/2012   Td 04/18/2005   Zoster 04/24/2008   Update 05/31/20 Patient has still not received Shingrix Vaccine  Plan -Recommend patient receive Shingrix vaccine in pharmacy.    Meds to D/C from list Promethazine 25mg  (no longer needed)  Miscellaneous Meds  Vitamin B12 1036mcg daily Vitamin D Vitamin C  [employer] 05/31/20: Pt now uses mail order pharmacy and this would allow her to have more affordable/free copays 03/31/20: Showed patient adherence packaging and how this could assist her with keeping up with her medications. She states she will think about it.   De Blanch, PharmD Clinical Pharmacist [facility] at [facility] [phone]

## 2020-06-01 ENCOUNTER — Ambulatory Visit (HOSPITAL_COMMUNITY): Payer: Medicare Other | Attending: Cardiovascular Disease

## 2020-06-01 ENCOUNTER — Other Ambulatory Visit: Payer: Self-pay

## 2020-06-01 DIAGNOSIS — R0600 Dyspnea, unspecified: Secondary | ICD-10-CM | POA: Diagnosis not present

## 2020-06-01 DIAGNOSIS — R0609 Other forms of dyspnea: Secondary | ICD-10-CM

## 2020-06-01 LAB — ECHOCARDIOGRAM COMPLETE
Area-P 1/2: 3.85 cm2
S' Lateral: 3 cm

## 2020-06-04 ENCOUNTER — Other Ambulatory Visit: Payer: Self-pay | Admitting: Sleep Medicine

## 2020-06-04 ENCOUNTER — Other Ambulatory Visit: Payer: Medicare Other

## 2020-06-04 DIAGNOSIS — Z20822 Contact with and (suspected) exposure to covid-19: Secondary | ICD-10-CM

## 2020-06-07 LAB — NOVEL CORONAVIRUS, NAA: SARS-CoV-2, NAA: NOT DETECTED

## 2020-06-29 ENCOUNTER — Telehealth: Payer: Self-pay | Admitting: Pharmacist

## 2020-06-29 NOTE — Progress Notes (Addendum)
Chronic Care Management Pharmacy Assistant   Name: Wendy Christensen  MRN: [id number] DOB: [birth year]  Reason for Encounter: Disease State  Patient Questions:  1.  Have you seen any other providers since your last visit? No  2.  Any changes in your medicines or health? No   PCP : [name], DO   Their chronic conditions include: Hypertension, Hyperlipidemia, Pre-Diabetes, Asthma, Depression/Anxiety, GERD, Allergic Rhinitis.  Office Visits: None since their last CCM visit with the clinical pharmacist.  Consults: None since their last CCM visit with the clinical pharmacist.  Allergies:  No Known Allergies  Medications: Outpatient Encounter Medications as of 06/29/2020  Medication Sig  . albuterol (PROAIR HFA) 108 (90 Base) MCG/ACT inhaler INHALE 2 PUFFS INTO THE LUNGS EVERY 4 HOURS AS NEEDED FOR WHEEZING  . ALPRAZolam (XANAX) 0.25 MG tablet Take 1 tablet (0.25 mg total) by mouth 2 (two) times daily as needed for anxiety.  Marland Kitchen amLODipine (NORVASC) 5 MG tablet Take 1 tablet (5 mg total) by mouth daily.  . citalopram (CELEXA) 40 MG tablet Take 1 tablet (40 mg total) by mouth every morning.  . fenofibrate 160 MG tablet Take 1 tablet (160 mg total) by mouth daily.  . fluticasone (FLONASE) 50 MCG/ACT nasal spray Place 2 sprays into both nostrils as needed.  . fluticasone furoate-vilanterol (BREO ELLIPTA) 200-25 MCG/INH AEPB Inhale 1 puff into the lungs daily. Per Dr. Donneta Romberg  . hydrochlorothiazide (HYDRODIURIL) 25 MG tablet Take 1 tablet (25 mg total) by mouth daily. 1 po qd prn  . metoprolol tartrate (LOPRESSOR) 100 MG tablet Take 1 tablet (100 mg total) by mouth 2 (two) times daily.  . montelukast (SINGULAIR) 10 MG tablet Take 1 tablet (10 mg total) by mouth at bedtime.  . NONFORMULARY OR COMPOUNDED ITEM Please dispense a blood pressure cuff to patient if approved through insurance.  . nystatin cream (MYCOSTATIN) Apply 1 application topically 2 (two) times daily.  Marland Kitchen  olmesartan (BENICAR) 40 MG tablet Take 1 tablet (40 mg total) by mouth daily.  Marland Kitchen omeprazole (PRILOSEC) 20 MG capsule Take 1 capsule (20 mg total) by mouth daily.  . promethazine (PHENERGAN) 25 MG tablet Take 1 tablet (25 mg total) by mouth every 8 (eight) hours as needed for nausea or vomiting.  Marland Kitchen Propylene Glycol-Glycerin (MOISTURE EYES OP) Apply to eye.  . rosuvastatin (CRESTOR) 20 MG tablet Take 1 tablet (20 mg total) by mouth daily. (Patient taking differently: Take 40 mg by mouth daily. Now has 40mg  per 04/13/20 visit/labs and per patient)  . vitamin B-12 (CYANOCOBALAMIN) 1000 MCG tablet Take 1,000 mcg by mouth daily.   No facility-administered encounter medications on file as of 06/29/2020.    Current Diagnosis: Patient Active Problem List   Diagnosis Date Noted  . History of asthma 04/13/2020  . Preventative health care 04/13/2020  . Urinary frequency 10/05/2019  . Preop examination 07/22/2018  . Tendon tear, ankle, right, initial encounter 07/16/2018  . Candidiasis of vagina 04/23/2018  . Uterine prolapse 04/23/2018  . Mass of right axilla 04/18/2018  . Chest pain 05/25/2017  . History of colon polyps 05/25/2017  . Hypertension 07/13/2015  . Renal insufficiency 03/30/2015  . Pain in the abdomen 02/24/2015  . Intrinsic asthma 01/26/2015  . Hyperglycemia 06/19/2013  . Hypothyroidism 06/17/2013  . OSA (obstructive sleep apnea) 01/14/2013  . BACK PAIN, THORACIC REGION, LEFT 11/18/2010  . ANXIETY DEPRESSION 05/20/2009  . SKIN TAG 04/19/2009  . POSTMENOPAUSAL STATUS 12/28/2008  . SINUSITIS- ACUTE-NOS 09/09/2008  .  ANXIETY 09/01/2008  . GENERALIZED ANXIETY DISORDER 06/30/2008  . INSOMNIA 04/24/2008  . GOITER NOS 06/04/2007  . NUMBNESS, ARM 06/04/2007  . WEIGHT LOSS 06/04/2007  . Hyperlipidemia LDL goal <100 02/07/2007  . Essential hypertension 02/07/2007  . ALLERGIC RHINITIS 02/07/2007  . GERD 02/07/2007  . COLONOSCOPY, HX OF 02/07/2007  . DILATION AND CURETTAGE, HX OF  02/07/2007    Goals Addressed   None    Reviewed chart prior to disease state call. Spoke with patient regarding BP  Recent Office Vitals: BP Readings from Last 3 Encounters:  05/11/20 132/80  04/13/20 (!) 182/90  03/31/20 (!) 172/84   Pulse Readings from Last 3 Encounters:  05/11/20 (!) 59  04/13/20 60  03/31/20 (!) 55    Wt Readings from Last 3 Encounters:  05/11/20 179 lb 9.6 oz (81.5 kg)  04/13/20 181 lb (82.1 kg)  03/31/20 182 lb (82.6 kg)     Kidney Function Lab Results  Component Value Date/Time   CREATININE 1.56 (H) 05/11/2020 02:34 PM   CREATININE 1.13 04/13/2020 02:33 PM   CREATININE 1.32 (H) 10/02/2019 11:36 AM   CREATININE 1.33 (H) 11/17/2016 04:02 PM   GFR 57.20 (L) 04/13/2020 02:33 PM   GFRNONAA 50 (L) 02/24/2015 01:45 PM   GFRAA 58 (L) 02/24/2015 01:45 PM    BMP Latest Ref Rng & Units 05/11/2020 04/13/2020 10/02/2019  Glucose 65 - 99 mg/dL 89 90 82  BUN 7 - 25 mg/dL 24 17 16   Creatinine 0.60 - 0.93 mg/dL 1.56(H) 1.13 1.32(H)  BUN/Creat Ratio 6 - 22 (calc) 15 - -  Sodium 135 - 146 mmol/L 138 136 139  Potassium 3.5 - 5.3 mmol/L 4.7 4.2 4.0  Chloride 98 - 110 mmol/L 103 100 104  CO2 20 - 32 mmol/L 26 31 30   Calcium 8.6 - 10.4 mg/dL 9.4 9.5 9.1   Patient answered the phone but was unable to review BP assessment with me. Stated her husband had surgery today and needed her help. Will call towards the end of the month.  . Current antihypertensive regimen:   Metoprolol tartrate 100 mg twice daily,  Olmesartan 40 mg daily  HCTZ 25 mg as needed  Amlodipine 5 mg daily  . How often are you checking your Blood Pressure? weekly   . Current home BP readings: 132/74 is the last reading the patient remembers  . What recent interventions/DTPs have been made by any provider to improve Blood Pressure control since last CPP Visit: none  . Any recent hospitalizations or ED visits since last visit with CPP? No   . What diet changes have been made to improve  Blood Pressure Control?  o Patient reports reducing salt intake and eating less.  . What exercise is being done to improve your Blood Pressure Control?  o Patient states she walks daily. She has not been able to go to the [facility], but misses it.  Adherence Review:  Is the patient currently on ACE/ARB medication? Yes olmesartan 40 MG tablet Does the patient have >5 day gap between last estimated fill dates? No    Follow-Up:  Pharmacist Review   Fanny Skates, [place] Pharmacist Assistant [phone]  Reviewed by: De Blanch, PharmD Clinical Pharmacist [facility] at [facility] [phone]

## 2020-07-01 ENCOUNTER — Other Ambulatory Visit: Payer: Self-pay | Admitting: Family Medicine

## 2020-07-01 DIAGNOSIS — I10 Essential (primary) hypertension: Secondary | ICD-10-CM

## 2020-07-07 ENCOUNTER — Other Ambulatory Visit: Payer: Self-pay | Admitting: Family Medicine

## 2020-07-07 DIAGNOSIS — Z1231 Encounter for screening mammogram for malignant neoplasm of breast: Secondary | ICD-10-CM

## 2020-07-17 ENCOUNTER — Ambulatory Visit: Payer: Medicare Other | Attending: Internal Medicine

## 2020-07-17 DIAGNOSIS — Z23 Encounter for immunization: Secondary | ICD-10-CM

## 2020-07-17 NOTE — Progress Notes (Signed)
° °  Covid-19 Vaccination Clinic  Name:  Wendy Christensen    MRN: [id number] DOB: Jun 11, 1946  07/17/2020  [name] was observed post Covid-19 immunization for 15 minutes without incident. She was provided with Vaccine Information Sheet and instruction to access the V-Safe system.   [name] was instructed to call 911 with any severe reactions post vaccine:  Difficulty breathing   Swelling of face and throat   A fast heartbeat   A bad rash all over body   Dizziness and weakness

## 2020-07-29 ENCOUNTER — Telehealth: Payer: Self-pay | Admitting: Pharmacist

## 2020-07-29 NOTE — Progress Notes (Addendum)
Chronic Care Management Pharmacy Assistant   Name: Wendy Christensen  MRN: [id number] DOB: 24-Jan-1946  Reason for Encounter: Disease State  Patient Questions:  1.  Have you seen any other providers since your last visit? No  2.  Any changes in your medicines or health? No    PCP : [name], DO   Their chronic conditions include: Hypertension, Hyperlipidemia, Pre-Diabetes, Asthma, Depression/Anxiety, GERD, Allergic Rhinitis.  Office Visits: None since their last Disease State Call on 06-29-2020.  Consults: None since their last Disease State Call on 06-29-2020.  Allergies:  No Known Allergies  Medications: Outpatient Encounter Medications as of 07/29/2020  Medication Sig   albuterol (PROAIR HFA) 108 (90 Base) MCG/ACT inhaler INHALE 2 PUFFS INTO THE LUNGS EVERY 4 HOURS AS NEEDED FOR WHEEZING   ALPRAZolam (XANAX) 0.25 MG tablet Take 1 tablet (0.25 mg total) by mouth 2 (two) times daily as needed for anxiety.   amLODipine (NORVASC) 5 MG tablet TAKE 1 TABLET(5 MG) BY MOUTH DAILY   citalopram (CELEXA) 40 MG tablet Take 1 tablet (40 mg total) by mouth every morning.   fenofibrate 160 MG tablet Take 1 tablet (160 mg total) by mouth daily.   fluticasone (FLONASE) 50 MCG/ACT nasal spray Place 2 sprays into both nostrils as needed.   fluticasone furoate-vilanterol (BREO ELLIPTA) 200-25 MCG/INH AEPB Inhale 1 puff into the lungs daily. Per Dr. Donneta Romberg   hydrochlorothiazide (HYDRODIURIL) 25 MG tablet Take 1 tablet (25 mg total) by mouth daily. 1 po qd prn   metoprolol tartrate (LOPRESSOR) 100 MG tablet Take 1 tablet (100 mg total) by mouth 2 (two) times daily.   montelukast (SINGULAIR) 10 MG tablet Take 1 tablet (10 mg total) by mouth at bedtime.   NONFORMULARY OR COMPOUNDED ITEM Please dispense a blood pressure cuff to patient if approved through insurance.   nystatin cream (MYCOSTATIN) Apply 1 application topically 2 (two) times daily.   olmesartan (BENICAR) 40 MG tablet  Take 1 tablet (40 mg total) by mouth daily.   omeprazole (PRILOSEC) 20 MG capsule Take 1 capsule (20 mg total) by mouth daily.   promethazine (PHENERGAN) 25 MG tablet Take 1 tablet (25 mg total) by mouth every 8 (eight) hours as needed for nausea or vomiting.   Propylene Glycol-Glycerin (MOISTURE EYES OP) Apply to eye.   rosuvastatin (CRESTOR) 20 MG tablet Take 1 tablet (20 mg total) by mouth daily. (Patient taking differently: Take 40 mg by mouth daily. Now has 40mg  per 04/13/20 visit/labs and per patient)   vitamin B-12 (CYANOCOBALAMIN) 1000 MCG tablet Take 1,000 mcg by mouth daily.   No facility-administered encounter medications on file as of 07/29/2020.    Current Diagnosis: Patient Active Problem List   Diagnosis Date Noted   History of asthma 04/13/2020   Preventative health care 04/13/2020   Urinary frequency 10/05/2019   Preop examination 07/22/2018   Tendon tear, ankle, right, initial encounter 07/16/2018   Candidiasis of vagina 04/23/2018   Uterine prolapse 04/23/2018   Mass of right axilla 04/18/2018   Chest pain 05/25/2017   History of colon polyps 05/25/2017   Hypertension 07/13/2015   Renal insufficiency 03/30/2015   Pain in the abdomen 02/24/2015   Intrinsic asthma 01/26/2015   Hyperglycemia 06/19/2013   Hypothyroidism 06/17/2013   OSA (obstructive sleep apnea) 01/14/2013   BACK PAIN, THORACIC REGION, LEFT 11/18/2010   ANXIETY DEPRESSION 05/20/2009   SKIN TAG 04/19/2009   POSTMENOPAUSAL STATUS 12/28/2008   SINUSITIS- ACUTE-NOS 09/09/2008   ANXIETY  09/01/2008   GENERALIZED ANXIETY DISORDER 06/30/2008   INSOMNIA 04/24/2008   GOITER NOS 06/04/2007   NUMBNESS, ARM 06/04/2007   WEIGHT LOSS 06/04/2007   Hyperlipidemia LDL goal <100 02/07/2007   Essential hypertension 02/07/2007   ALLERGIC RHINITIS 02/07/2007   GERD 02/07/2007   COLONOSCOPY, HX OF 02/07/2007   DILATION AND CURETTAGE, HX OF 02/07/2007    Goals Addressed   None    Reviewed chart prior to  disease state call. Spoke with patient regarding BP  Recent Office Vitals: BP Readings from Last 3 Encounters:  05/11/20 132/80  04/13/20 (!) 182/90  03/31/20 (!) 172/84   Pulse Readings from Last 3 Encounters:  05/11/20 (!) 59  04/13/20 60  03/31/20 (!) 55    Wt Readings from Last 3 Encounters:  05/11/20 179 lb 9.6 oz (81.5 kg)  04/13/20 181 lb (82.1 kg)  03/31/20 182 lb (82.6 kg)     Kidney Function Lab Results  Component Value Date/Time   CREATININE 1.56 (H) 05/11/2020 02:34 PM   CREATININE 1.13 04/13/2020 02:33 PM   CREATININE 1.32 (H) 10/02/2019 11:36 AM   CREATININE 1.33 (H) 11/17/2016 04:02 PM   GFR 57.20 (L) 04/13/2020 02:33 PM   GFRNONAA 50 (L) 02/24/2015 01:45 PM   GFRAA 58 (L) 02/24/2015 01:45 PM    BMP Latest Ref Rng & Units 05/11/2020 04/13/2020 10/02/2019  Glucose 65 - 99 mg/dL 89 90 82  BUN 7 - 25 mg/dL 24 17 16   Creatinine 0.60 - 0.93 mg/dL 1.56(H) 1.13 1.32(H)  BUN/Creat Ratio 6 - 22 (calc) 15 - -  Sodium 135 - 146 mmol/L 138 136 139  Potassium 3.5 - 5.3 mmol/L 4.7 4.2 4.0  Chloride 98 - 110 mmol/L 103 100 104  CO2 20 - 32 mmol/L 26 31 30   Calcium 8.6 - 10.4 mg/dL 9.4 9.5 9.1    Current antihypertensive regimen:  Metoprolol tartrate 100 mg twice daily, Olmesartan 40 mg daily Hctz 25 mg as needed Amlodipine 5 mg daily  Patient did report taking her medications daily without any issues or concerns.   How often are you checking your Blood Pressure? infrequently. Patient reports just getting home from out of town and was not able to check her BP while on vacation.   Current home BP readings: patient did not have any current readings at the time of the call.  What recent interventions/DTPs have been made by any provider to improve Blood Pressure control since last CPP Visit: none  Any recent hospitalizations or ED visits since last visit with CPP? No   What diet changes have been made to improve Blood Pressure Control?  None  What exercise is  being done to improve your Blood Pressure Control?  Patient states she was able to do a lot of walking while out of town.  Adherence Review: Is the patient currently on ACE/ARB medication? Yes olmesartan 40 MG tablet Does the patient have >5 day gap between last estimated fill dates? Yes    Follow-Up:  Pharmacist Review   Fanny Skates, [place] Pharmacist Assistant [phone]   Noted >5 day gap history per dispense report. Need to assure adherence.  Fill Dates Per Dispense Report  Rosuvastatin: 04/13/20 90 DS Olmesartan: 04/13/20 90 DS  Reviewed by: De Blanch, PharmD Clinical Pharmacist [facility] at [facility] [phone]

## 2020-07-30 DIAGNOSIS — G4733 Obstructive sleep apnea (adult) (pediatric): Secondary | ICD-10-CM | POA: Diagnosis not present

## 2020-08-18 ENCOUNTER — Telehealth: Payer: Self-pay | Admitting: Pharmacist

## 2020-08-18 NOTE — Progress Notes (Addendum)
Chronic Care Management Pharmacy Assistant   Name: Tamani Durney  MRN: [id number] DOB: 1945/12/08  Reason for Encounter: Medication Adherence  Patient Questions:  1.  Have you seen any other providers since your last visit? No  2.  Any changes in your medicines or health? No     PCP : [name], DO  Allergies:  No Known Allergies  Medications: Outpatient Encounter Medications as of 08/18/2020  Medication Sig   albuterol (PROAIR HFA) 108 (90 Base) MCG/ACT inhaler INHALE 2 PUFFS INTO THE LUNGS EVERY 4 HOURS AS NEEDED FOR WHEEZING   ALPRAZolam (XANAX) 0.25 MG tablet Take 1 tablet (0.25 mg total) by mouth 2 (two) times daily as needed for anxiety.   amLODipine (NORVASC) 5 MG tablet TAKE 1 TABLET(5 MG) BY MOUTH DAILY   citalopram (CELEXA) 40 MG tablet Take 1 tablet (40 mg total) by mouth every morning.   fenofibrate 160 MG tablet Take 1 tablet (160 mg total) by mouth daily.   fluticasone (FLONASE) 50 MCG/ACT nasal spray Place 2 sprays into both nostrils as needed.   fluticasone furoate-vilanterol (BREO ELLIPTA) 200-25 MCG/INH AEPB Inhale 1 puff into the lungs daily. Per Dr. Donneta Romberg   hydrochlorothiazide (HYDRODIURIL) 25 MG tablet Take 1 tablet (25 mg total) by mouth daily. 1 po qd prn   metoprolol tartrate (LOPRESSOR) 100 MG tablet Take 1 tablet (100 mg total) by mouth 2 (two) times daily.   montelukast (SINGULAIR) 10 MG tablet Take 1 tablet (10 mg total) by mouth at bedtime.   NONFORMULARY OR COMPOUNDED ITEM Please dispense a blood pressure cuff to patient if approved through insurance.   nystatin cream (MYCOSTATIN) Apply 1 application topically 2 (two) times daily.   olmesartan (BENICAR) 40 MG tablet Take 1 tablet (40 mg total) by mouth daily.   omeprazole (PRILOSEC) 20 MG capsule Take 1 capsule (20 mg total) by mouth daily.   promethazine (PHENERGAN) 25 MG tablet Take 1 tablet (25 mg total) by mouth every 8 (eight) hours as needed for nausea or vomiting.    Propylene Glycol-Glycerin (MOISTURE EYES OP) Apply to eye.   rosuvastatin (CRESTOR) 20 MG tablet Take 1 tablet (20 mg total) by mouth daily. (Patient taking differently: Take 40 mg by mouth daily. Now has 40mg  per 04/13/20 visit/labs and per patient)   vitamin B-12 (CYANOCOBALAMIN) 1000 MCG tablet Take 1,000 mcg by mouth daily.   No facility-administered encounter medications on file as of 08/18/2020.    Current Diagnosis: Patient Active Problem List   Diagnosis Date Noted   History of asthma 04/13/2020   Preventative health care 04/13/2020   Urinary frequency 10/05/2019   Preop examination 07/22/2018   Tendon tear, ankle, right, initial encounter 07/16/2018   Candidiasis of vagina 04/23/2018   Uterine prolapse 04/23/2018   Mass of right axilla 04/18/2018   Chest pain 05/25/2017   History of colon polyps 05/25/2017   Hypertension 07/13/2015   Renal insufficiency 03/30/2015   Pain in the abdomen 02/24/2015   Intrinsic asthma 01/26/2015   Hyperglycemia 06/19/2013   Hypothyroidism 06/17/2013   OSA (obstructive sleep apnea) 01/14/2013   BACK PAIN, THORACIC REGION, LEFT 11/18/2010   ANXIETY DEPRESSION 05/20/2009   SKIN TAG 04/19/2009   POSTMENOPAUSAL STATUS 12/28/2008   SINUSITIS- ACUTE-NOS 09/09/2008   ANXIETY 09/01/2008   GENERALIZED ANXIETY DISORDER 06/30/2008   INSOMNIA 04/24/2008   GOITER NOS 06/04/2007   NUMBNESS, ARM 06/04/2007   WEIGHT LOSS 06/04/2007   Hyperlipidemia LDL goal <100 02/07/2007   Essential  hypertension 02/07/2007   ALLERGIC RHINITIS 02/07/2007   GERD 02/07/2007   COLONOSCOPY, HX OF 02/07/2007   DILATION AND CURETTAGE, HX OF 02/07/2007    Goals Addressed   None    Contacted the patient in regards to medications olmesartan and rosuvastatin. Patient stated she received all of her medications via mail last month. I asked her to check both bottles of medication including Olmesartan 40 mg and Rosuvastatin 20 mg for the fill dates.She stated the dates listed  on each bottle was November 16th 2021. She also mentioned that she had taken her last Amlodipine 5 mg tab this morning and there weren't anymore refills, but Dr. [name] wanted to check how her blood pressure was doing before moving forward with the medication. I advised to the patient that she contact her PCP's office to inform them that she had completed the medication so that a determination can be made as to her continuation of the medication. I am also sending a message to PCP informing them of this.  Fanny Skates, [place] Pharmacist Assistant [phone]  Follow-Up:  Pharmacist Review   4 minutes spent in review and documentation.   Reviewed by: De Blanch, PharmD, BCACP Clinical Pharmacist [facility] at [facility] [phone]

## 2020-08-19 ENCOUNTER — Other Ambulatory Visit: Payer: Self-pay

## 2020-08-19 DIAGNOSIS — I10 Essential (primary) hypertension: Secondary | ICD-10-CM

## 2020-08-19 MED ORDER — AMLODIPINE BESYLATE 5 MG PO TABS: ORAL_TABLET | ORAL | 2 refills | Status: DC

## 2020-08-28 NOTE — Chronic Care Management (AMB) (Deleted)
Chronic Care Management Pharmacy  Name: Wendy Christensen  MRN: [id number] DOB: August 29, 1946  Chief Complaint/ HPI  Wendy Christensen,  74 y.o. , female presents for their Follow-Up CCM visit with the clinical pharmacist via telephone due to COVID-19 Pandemic   PCP : Wendy Held, DO  Their chronic conditions include: Hypertension, Hyperlipidemia, Pre-Diabetes, Asthma, Depression/Anxiety, GERD, Allergic Rhinitis  Office Visits: None since last CCM visit on 05/31/20.   Consult Visit: None since last CCM visit on 05/31/20.   Medications: Outpatient Encounter Medications as of 08/30/2020  Medication Sig  . albuterol (PROAIR HFA) 108 (90 Base) MCG/ACT inhaler INHALE 2 PUFFS INTO THE LUNGS EVERY 4 HOURS AS NEEDED FOR WHEEZING  . ALPRAZolam (XANAX) 0.25 MG tablet Take 1 tablet (0.25 mg total) by mouth 2 (two) times daily as needed for anxiety.  Marland Kitchen amLODipine (NORVASC) 5 MG tablet TAKE 1 TABLET(5 MG) BY MOUTH DAILY  . citalopram (CELEXA) 40 MG tablet Take 1 tablet (40 mg total) by mouth every morning.  . fenofibrate 160 MG tablet Take 1 tablet (160 mg total) by mouth daily.  . fluticasone (FLONASE) 50 MCG/ACT nasal spray Place 2 sprays into both nostrils as needed.  . fluticasone furoate-vilanterol (BREO ELLIPTA) 200-25 MCG/INH AEPB Inhale 1 puff into the lungs daily. Per Dr. Donneta Christensen  . hydrochlorothiazide (HYDRODIURIL) 25 MG tablet Take 1 tablet (25 mg total) by mouth daily. 1 po qd prn  . metoprolol tartrate (LOPRESSOR) 100 MG tablet Take 1 tablet (100 mg total) by mouth 2 (two) times daily.  . montelukast (SINGULAIR) 10 MG tablet Take 1 tablet (10 mg total) by mouth at bedtime.  . NONFORMULARY OR COMPOUNDED ITEM Please dispense a blood pressure cuff to patient if approved through insurance.  . nystatin cream (MYCOSTATIN) Apply 1 application topically 2 (two) times daily.  Marland Kitchen olmesartan (BENICAR) 40 MG tablet Take 1 tablet (40 mg total) by mouth daily.  Marland Kitchen omeprazole  (PRILOSEC) 20 MG capsule Take 1 capsule (20 mg total) by mouth daily.  . promethazine (PHENERGAN) 25 MG tablet Take 1 tablet (25 mg total) by mouth every 8 (eight) hours as needed for nausea or vomiting.  Marland Kitchen Propylene Glycol-Glycerin (MOISTURE EYES OP) Apply to eye.  . rosuvastatin (CRESTOR) 20 MG tablet Take 1 tablet (20 mg total) by mouth daily. (Patient taking differently: Take 40 mg by mouth daily. Now has 40mg  per 04/13/20 visit/labs and per patient)  . vitamin B-12 (CYANOCOBALAMIN) 1000 MCG tablet Take 1,000 mcg by mouth daily.   No facility-administered encounter medications on file as of 08/30/2020.   SDOH Screenings   Alcohol Screen: Not on file  Depression (PHQ2-9): Medium Risk  . PHQ-2 Score: 9  Financial Resource Strain: Low Risk   . Difficulty of Paying Living Expenses: Not very hard  Food Insecurity: Not on file  Housing: Not on file  Physical Activity: Not on file  Social Connections: Not on file  Stress: Not on file  Tobacco Use: Low Risk   . Smoking Tobacco Use: Never Smoker  . Smokeless Tobacco Use: Never Used  Transportation Needs: Not on file     Current Diagnosis/Assessment:  Goals Addressed   None    Social Hx: Born in raised in [place] a niece that works as a Software engineer with [employer] as a Psychologist, educational.  Married 20 years. 2nd marriage. 7 children and 8 grandchildren and 3 great grandchildren.  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 She plans to reduce the amount of time she baby sits.  Pre-Diabetes   A1c goal <6.5%  Recent Relevant Labs: Lab Results  Component Value Date/Time   HGBA1C 6.4 09/14/2014 12:01 PM   HGBA1C 6.4 06/19/2013 08:53 AM   MICROALBUR <0.7 03/23/2015 09:58 AM   MICROALBUR 0.4 09/14/2014 12:01 PM    Patient has failed these meds in past: None noted  Patient is currently borderline controlled on the following medications: None  Discussed importance of  limiting carbs.  She mentions that her husband has diabetes, but does not follow the recommended diet.  Discussed A1c goal.   Update  Patient would still benefit from updated a1c.   Plan -Limit carbohydrates -Continue control with diet and exercise  -Get repeat a1c at next visit with Dr. Etter Christensen  Hypertension   Goal BP <140/90  CMP Latest Ref Rng & Units 05/11/2020 04/13/2020 10/02/2019  Glucose 65 - 99 mg/dL 89 90 82  BUN 7 - 25 mg/dL 24 17 16   Creatinine 0.60 - 0.93 mg/dL 1.56(H) 1.13 1.32(H)  Sodium 135 - 146 mmol/L 138 136 139  Potassium 3.5 - 5.3 mmol/L 4.7 4.2 4.0  Chloride 98 - 110 mmol/L 103 100 104  CO2 20 - 32 mmol/L 26 31 30   Calcium 8.6 - 10.4 mg/dL 9.4 9.5 9.1  Total Protein 6.1 - 8.1 g/dL 7.4 7.4 7.1  Total Bilirubin 0.2 - 1.2 mg/dL 0.4 0.4 0.4  Alkaline Phos 39 - 117 U/L - 61 74  AST 10 - 35 U/L 17 15 14   ALT 6 - 29 U/L 11 11 11    Kidney Function Lab Results  Component Value Date/Time   CREATININE 1.56 (H) 05/11/2020 02:34 PM   CREATININE 1.13 04/13/2020 02:33 PM   CREATININE 1.32 (H) 10/02/2019 11:36 AM   CREATININE 1.33 (H) 11/17/2016 04:02 PM   GFR 57.20 (L) 04/13/2020 02:33 PM   GFRNONAA 50 (L) 02/24/2015 01:45 PM   GFRAA 58 (L) 02/24/2015 01:45 PM   Office blood pressures are  BP Readings from Last 3 Encounters:  05/11/20 132/80  04/13/20 (!) 182/90  03/31/20 (!) 172/84   Patient has failed these meds in the past: None noted  Patient is currently uncontrolled on the following medications:   Metoprolol tartrate 100mg  twice daily,  Olmesartan 40mg  daily  Hctz 25mg  as needed  Amlodipine 5mg  daily  HCTZ - was told to take it only when her feet are swelling. Last used late February 2021  Olmesartan - Pt appears to be nonadherent. She would benefit from adherence packaging. Will offer at next visit when she has more time.  Last fill 08/20/19 for 90 Wendy Christensen supply and patient reports she still as #2 remaining.   Patient checks BP at home several  times per month  Patient home BP readings are ranging: 140s/70s per patient  We discussed Changing time of Wendy Christensen of taking olmesartan to remember to take. Blood pressure goals.  Update 05/31/20 She has purchased a BP cuff, but has not started checking her BP. Noted her last clinic BP was lower than previous readings. Strongly encouraged patient to check her BP at least once per week. She states she is agreeable to this. Denies headaches, chest pain, or dizziness.  She is not low on either amlodipine or olmesartan.  Discussed possibility of getting combo pill of olmesartan and amlodipine. She would be interested in this after she uses her remaining supply.  Update 08/30/20  Plan -Continue current medications -  Check BP at least once per week.  Future Plan -Consider changing patient to olmesartan/amlodipine 40/5mg  daily to reduce pill burden  Hyperlipidemia   LDL goal <100  Lipid Panel     Component Value Date/Time   CHOL 172 05/11/2020 1434   TRIG 78 05/11/2020 1434   HDL 58 05/11/2020 1434   CHOLHDL 3.0 05/11/2020 1434   VLDL 21.0 04/13/2020 1433   LDLCALC 97 05/11/2020 1434   LDLDIRECT 215.7 10/18/2011 0852    The 10-year ASCVD risk score Mikey Bussing DC Jr., et al., 2013) is: 12.3%   Values used to calculate the score:     Age: 41 years     Sex: Female     Is Non-Hispanic African American: Yes     Diabetic: No     Tobacco smoker: No     Systolic Blood Pressure: 341 mmHg     Is BP treated: Yes     HDL Cholesterol: 58 mg/dL     Total Cholesterol: 172 mg/dL   Patient has failed these meds in past: atorvastatin (listed in D/C meds. No apparent reason for D/C) Patient is currently uncontrolled on the following medications:   Rosuvastatin 40mg  daily  Fenofibrate 160mg  daily  Diet Does not eat a lot of fried food.  Eats a lot of sweets  We discussed:  How eating sweets can affect cholesterol   Update 03/31/20 Emphasized the importance of medication adherence.   Update  04/07/20 No change since last week.  Update 05/31/20 LDL to goal! Congratulated patient on this.   Plan -Continue current medications   Vaccines   Reviewed and discussed patient's vaccination history.    Immunization History  Administered Date(s) Administered  . Influenza Split 05/29/2012, 06/17/2013  . Influenza Whole 07/31/2007  . Influenza, High Dose Seasonal PF 07/02/2015, 05/24/2017, 07/16/2018  . Influenza-Unspecified 08/07/2014, 06/19/2016  . PFIZER SARS-COV-2 Vaccination 11/11/2019, 12/02/2019, 07/17/2020  . Pneumococcal Conjugate-13 07/07/2015  . Pneumococcal Polysaccharide-23 12/16/2012  . Td 04/18/2005  . Zoster 04/24/2008   Update 05/31/20 Patient has still not received Shingrix Vaccine  Plan -Recommend patient receive Shingrix vaccine in pharmacy.    Meds to D/C from list Promethazine 25mg  (no longer needed)  Miscellaneous Meds  Vitamin B12 1018mcg daily Vitamin D Vitamin C  [employer] 05/31/20: Pt now uses mail order pharmacy and this would allow her to have more affordable/free copays  Melvenia Beam Jaquasia Doscher, PharmD, BCACP Clinical Pharmacist [facility] at [facility] [phone]

## 2020-08-30 ENCOUNTER — Other Ambulatory Visit: Payer: Self-pay

## 2020-08-30 ENCOUNTER — Telehealth: Payer: Medicare Other

## 2020-08-30 ENCOUNTER — Ambulatory Visit
Admission: RE | Admit: 2020-08-30 | Discharge: 2020-08-30 | Disposition: A | Discharge status: Home / Self Care | Payer: Medicare Other | Source: Ambulatory Visit | Attending: Family Medicine | Admitting: Family Medicine

## 2020-08-30 DIAGNOSIS — Z1231 Encounter for screening mammogram for malignant neoplasm of breast: Secondary | ICD-10-CM | POA: Diagnosis not present

## 2020-08-30 NOTE — Chronic Care Management (AMB) (Deleted)
Chronic Care Management Pharmacy  Name: Wendy Christensen  MRN: [id number] DOB: 1946/06/19  Chief Complaint/ HPI  Wendy Christensen,  74 y.o. , female presents for their Follow-Up CCM visit with the clinical pharmacist via telephone due to COVID-19 Pandemic   PCP : [name], DO  Their chronic conditions include: Hypertension, Hyperlipidemia, Pre-Diabetes, Asthma, Depression/Anxiety, GERD, Allergic Rhinitis  Office Visits: None since last CCM visit on 05/31/20.   Consult Visit: None since last CCM visit on 05/31/20.   Medications: Outpatient Encounter Medications as of 08/30/2020  Medication Sig  . albuterol (PROAIR HFA) 108 (90 Base) MCG/ACT inhaler INHALE 2 PUFFS INTO THE LUNGS EVERY 4 HOURS AS NEEDED FOR WHEEZING  . ALPRAZolam (XANAX) 0.25 MG tablet Take 1 tablet (0.25 mg total) by mouth 2 (two) times daily as needed for anxiety.  Wendy Christensen Kitchen amLODipine (NORVASC) 5 MG tablet TAKE 1 TABLET(5 MG) BY MOUTH DAILY  . citalopram (CELEXA) 40 MG tablet Take 1 tablet (40 mg total) by mouth every morning.  . fenofibrate 160 MG tablet Take 1 tablet (160 mg total) by mouth daily.  . fluticasone (FLONASE) 50 MCG/ACT nasal spray Place 2 sprays into both nostrils as needed.  . fluticasone furoate-vilanterol (BREO ELLIPTA) 200-25 MCG/INH AEPB Inhale 1 puff into the lungs daily. Per Dr. Donneta Romberg  . hydrochlorothiazide (HYDRODIURIL) 25 MG tablet Take 1 tablet (25 mg total) by mouth daily. 1 po qd prn  . metoprolol tartrate (LOPRESSOR) 100 MG tablet Take 1 tablet (100 mg total) by mouth 2 (two) times daily.  . montelukast (SINGULAIR) 10 MG tablet Take 1 tablet (10 mg total) by mouth at bedtime.  . NONFORMULARY OR COMPOUNDED ITEM Please dispense a blood pressure cuff to patient if approved through insurance.  . nystatin cream (MYCOSTATIN) Apply 1 application topically 2 (two) times daily.  Wendy Christensen Kitchen olmesartan (BENICAR) 40 MG tablet Take 1 tablet (40 mg total) by mouth daily.  Wendy Christensen Kitchen omeprazole  (PRILOSEC) 20 MG capsule Take 1 capsule (20 mg total) by mouth daily.  . promethazine (PHENERGAN) 25 MG tablet Take 1 tablet (25 mg total) by mouth every 8 (eight) hours as needed for nausea or vomiting.  Wendy Christensen Kitchen Propylene Glycol-Glycerin (MOISTURE EYES OP) Apply to eye.  . rosuvastatin (CRESTOR) 20 MG tablet Take 1 tablet (20 mg total) by mouth daily. (Patient taking differently: Take 40 mg by mouth daily. Now has 40mg  per 04/13/20 visit/labs and per patient)  . vitamin B-12 (CYANOCOBALAMIN) 1000 MCG tablet Take 1,000 mcg by mouth daily.   No facility-administered encounter medications on file as of 08/30/2020.   SDOH Screenings   Alcohol Screen: Not on file  Depression (PHQ2-9): Medium Risk  . PHQ-2 Score: 9  Financial Resource Strain: Low Risk   . Difficulty of Paying Living Expenses: Not very hard  Food Insecurity: Not on file  Housing: Not on file  Physical Activity: Not on file  Social Connections: Not on file  Stress: Not on file  Tobacco Use: Low Risk   . Smoking Tobacco Use: Never Smoker  . Smokeless Tobacco Use: Never Used  Transportation Needs: Not on file     Current Diagnosis/Assessment:  Goals Addressed   None    Social Hx: [place] a niece that works as a Software engineer with [employer] as a Psychologist, educational.  Married 20 years. 2nd marriage. 7 children and 8 grandchildren and 3 great grandchildren.  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 She plans to reduce the amount of time she baby sits.  Pre-Diabetes   A1c goal <6.5%  Recent Relevant Labs: Lab Results  Component Value Date/Time   HGBA1C 6.4 09/14/2014 12:01 PM   HGBA1C 6.4 06/19/2013 08:53 AM   MICROALBUR <0.7 03/23/2015 09:58 AM   MICROALBUR 0.4 09/14/2014 12:01 PM    Patient has failed these meds in past: None noted  Patient is currently borderline controlled on the following medications: None  Discussed importance of  limiting carbs.  She mentions that her husband has diabetes, but does not follow the recommended diet.  Discussed A1c goal.   Update  Patient would still benefit from updated a1c.   Plan -Limit carbohydrates -Continue control with diet and exercise  -Get repeat a1c at next visit with Dr. [name]  Hypertension   Goal BP <140/90  CMP Latest Ref Rng & Units 05/11/2020 04/13/2020 10/02/2019  Glucose 65 - 99 mg/dL 89 90 82  BUN 7 - 25 mg/dL 24 17 16   Creatinine 0.60 - 0.93 mg/dL 1.56(H) 1.13 1.32(H)  Sodium 135 - 146 mmol/L 138 136 139  Potassium 3.5 - 5.3 mmol/L 4.7 4.2 4.0  Chloride 98 - 110 mmol/L 103 100 104  CO2 20 - 32 mmol/L 26 31 30   Calcium 8.6 - 10.4 mg/dL 9.4 9.5 9.1  Total Protein 6.1 - 8.1 g/dL 7.4 7.4 7.1  Total Bilirubin 0.2 - 1.2 mg/dL 0.4 0.4 0.4  Alkaline Phos 39 - 117 U/L - 61 74  AST 10 - 35 U/L 17 15 14   ALT 6 - 29 U/L 11 11 11    Kidney Function Lab Results  Component Value Date/Time   CREATININE 1.56 (H) 05/11/2020 02:34 PM   CREATININE 1.13 04/13/2020 02:33 PM   CREATININE 1.32 (H) 10/02/2019 11:36 AM   CREATININE 1.33 (H) 11/17/2016 04:02 PM   GFR 57.20 (L) 04/13/2020 02:33 PM   GFRNONAA 50 (L) 02/24/2015 01:45 PM   GFRAA 58 (L) 02/24/2015 01:45 PM   Office blood pressures are  BP Readings from Last 3 Encounters:  05/11/20 132/80  04/13/20 (!) 182/90  03/31/20 (!) 172/84   Patient has failed these meds in the past: None noted  Patient is currently uncontrolled on the following medications:   Metoprolol tartrate 100mg  twice daily,  Olmesartan 40mg  daily  Hctz 25mg  as needed  Amlodipine 5mg  daily  HCTZ - was told to take it only when her feet are swelling. Last used late February 2021  Olmesartan - Pt appears to be nonadherent. She would benefit from adherence packaging. Will offer at next visit when she has more time.  Last fill 08/20/19 for 90 Anjeanette Petzold supply and patient reports she still as #2 remaining.   Patient checks BP at home several  times per month  Patient home BP readings are ranging: 140s/70s per patient  We discussed Changing time of Nalini Alcaraz of taking olmesartan to remember to take. Blood pressure goals.  Update 05/31/20 She has purchased a BP cuff, but has not started checking her BP. Noted her last clinic BP was lower than previous readings. Strongly encouraged patient to check her BP at least once per week. She states she is agreeable to this. Denies headaches, chest pain, or dizziness.  She is not low on either amlodipine or olmesartan.  Discussed possibility of getting combo pill of olmesartan and amlodipine. She would be interested in this after she uses her remaining supply.  Update 08/30/20  Plan -Continue current medications -  Check BP at least once per week.  Future Plan -Consider changing patient to olmesartan/amlodipine 40/5mg  daily to reduce pill burden  Hyperlipidemia   LDL goal <100  Lipid Panel     Component Value Date/Time   CHOL 172 05/11/2020 1434   TRIG 78 05/11/2020 1434   HDL 58 05/11/2020 1434   CHOLHDL 3.0 05/11/2020 1434   VLDL 21.0 04/13/2020 1433   LDLCALC 97 05/11/2020 1434   LDLDIRECT 215.7 10/18/2011 0852    The 10-year ASCVD risk score Mikey Bussing DC Jr., et al., 2013) is: 12.3%   Values used to calculate the score:     Age: 30 years     Sex: Female     Is Non-Hispanic African American: Yes     Diabetic: No     Tobacco smoker: No     Systolic Blood Pressure: 537 mmHg     Is BP treated: Yes     HDL Cholesterol: 58 mg/dL     Total Cholesterol: 172 mg/dL   Patient has failed these meds in past: atorvastatin (listed in D/C meds. No apparent reason for D/C) Patient is currently uncontrolled on the following medications:   Rosuvastatin 40mg  daily  Fenofibrate 160mg  daily  Diet Does not eat a lot of fried food.  Eats a lot of sweets  We discussed:  How eating sweets can affect cholesterol   Update 03/31/20 Emphasized the importance of medication adherence.   Update  04/07/20 No change since last week.  Update 05/31/20 LDL to goal! Congratulated patient on this.   Plan -Continue current medications   Vaccines   Reviewed and discussed patient's vaccination history.    Immunization History  Administered Date(s) Administered  . Influenza Split 05/29/2012, 06/17/2013  . Influenza Whole 07/31/2007  . Influenza, High Dose Seasonal PF 07/02/2015, 05/24/2017, 07/16/2018  . Influenza-Unspecified 08/07/2014, 06/19/2016  . PFIZER SARS-COV-2 Vaccination 11/11/2019, 12/02/2019, 07/17/2020  . Pneumococcal Conjugate-13 07/07/2015  . Pneumococcal Polysaccharide-23 12/16/2012  . Td 04/18/2005  . Zoster 04/24/2008   Update 05/31/20 Patient has still not received Shingrix Vaccine  Plan -Recommend patient receive Shingrix vaccine in pharmacy.    Meds to D/C from list Promethazine 25mg  (no longer needed)  Miscellaneous Meds  Vitamin B12 1062mcg daily Vitamin D Vitamin C  [employer] 05/31/20: Pt now uses mail order pharmacy and this would allow her to have more affordable/free copays  Melvenia Beam Laquan Ludden, PharmD, BCACP Clinical Pharmacist [facility] [phone]

## 2020-09-03 ENCOUNTER — Other Ambulatory Visit: Payer: Self-pay | Admitting: Family Medicine

## 2020-09-03 DIAGNOSIS — R928 Other abnormal and inconclusive findings on diagnostic imaging of breast: Secondary | ICD-10-CM

## 2020-09-09 ENCOUNTER — Ambulatory Visit
Admission: RE | Admit: 2020-09-09 | Discharge: 2020-09-09 | Disposition: A | Discharge status: Home / Self Care | Payer: Medicare Other | Source: Ambulatory Visit | Attending: Family Medicine | Admitting: Family Medicine

## 2020-09-09 ENCOUNTER — Other Ambulatory Visit: Payer: Self-pay

## 2020-09-09 DIAGNOSIS — R928 Other abnormal and inconclusive findings on diagnostic imaging of breast: Secondary | ICD-10-CM

## 2020-09-09 DIAGNOSIS — N6002 Solitary cyst of left breast: Secondary | ICD-10-CM | POA: Diagnosis not present

## 2020-09-24 ENCOUNTER — Telehealth: Payer: Self-pay | Admitting: Pharmacist

## 2020-09-24 NOTE — Progress Notes (Addendum)
Chronic Care Management Pharmacy Assistant   Name: Wendy Christensen  MRN: [id number] DOB: Jul 26, 1946  Reason for Encounter: HTN Disease State  Patient Questions:  1.  Have you seen any other providers since your last visit? No  2.  Any changes in your medicines or health? No    PCP : [name], DO  Their chronic conditions include: Hypertension, Hyperlipidemia, Pre-Diabetes, Asthma, Depression/Anxiety, GERD, Allergic Rhinitis.  Office Visits: None since their last Disease State Call on 06-29-2020.   Consults: None since their last Disease State Call on 06-29-2020.  Allergies:  No Known Allergies  Medications: Outpatient Encounter Medications as of 09/24/2020  Medication Sig   albuterol (PROAIR HFA) 108 (90 Base) MCG/ACT inhaler INHALE 2 PUFFS INTO THE LUNGS EVERY 4 HOURS AS NEEDED FOR WHEEZING   ALPRAZolam (XANAX) 0.25 MG tablet Take 1 tablet (0.25 mg total) by mouth 2 (two) times daily as needed for anxiety.   amLODipine (NORVASC) 5 MG tablet TAKE 1 TABLET(5 MG) BY MOUTH DAILY   citalopram (CELEXA) 40 MG tablet Take 1 tablet (40 mg total) by mouth every morning.   fenofibrate 160 MG tablet Take 1 tablet (160 mg total) by mouth daily.   fluticasone (FLONASE) 50 MCG/ACT nasal spray Place 2 sprays into both nostrils as needed.   fluticasone furoate-vilanterol (BREO ELLIPTA) 200-25 MCG/INH AEPB Inhale 1 puff into the lungs daily. Per Dr. Donneta Romberg   hydrochlorothiazide (HYDRODIURIL) 25 MG tablet Take 1 tablet (25 mg total) by mouth daily. 1 po qd prn   metoprolol tartrate (LOPRESSOR) 100 MG tablet Take 1 tablet (100 mg total) by mouth 2 (two) times daily.   montelukast (SINGULAIR) 10 MG tablet Take 1 tablet (10 mg total) by mouth at bedtime.   NONFORMULARY OR COMPOUNDED ITEM Please dispense a blood pressure cuff to patient if approved through insurance.   nystatin cream (MYCOSTATIN) Apply 1 application topically 2 (two) times daily.   olmesartan (BENICAR) 40 MG  tablet Take 1 tablet (40 mg total) by mouth daily.   omeprazole (PRILOSEC) 20 MG capsule Take 1 capsule (20 mg total) by mouth daily.   promethazine (PHENERGAN) 25 MG tablet Take 1 tablet (25 mg total) by mouth every 8 (eight) hours as needed for nausea or vomiting.   Propylene Glycol-Glycerin (MOISTURE EYES OP) Apply to eye.   rosuvastatin (CRESTOR) 20 MG tablet Take 1 tablet (20 mg total) by mouth daily. (Patient taking differently: Take 40 mg by mouth daily. Now has 40mg  per 04/13/20 visit/labs and per patient)   vitamin B-12 (CYANOCOBALAMIN) 1000 MCG tablet Take 1,000 mcg by mouth daily.   No facility-administered encounter medications on file as of 09/24/2020.    Current Diagnosis: Patient Active Problem List   Diagnosis Date Noted   History of asthma 04/13/2020   Preventative health care 04/13/2020   Urinary frequency 10/05/2019   Preop examination 07/22/2018   Tendon tear, ankle, right, initial encounter 07/16/2018   Candidiasis of vagina 04/23/2018   Uterine prolapse 04/23/2018   Mass of right axilla 04/18/2018   Chest pain 05/25/2017   History of colon polyps 05/25/2017   Hypertension 07/13/2015   Renal insufficiency 03/30/2015   Pain in the abdomen 02/24/2015   Intrinsic asthma 01/26/2015   Hyperglycemia 06/19/2013   Hypothyroidism 06/17/2013   OSA (obstructive sleep apnea) 01/14/2013   BACK PAIN, THORACIC REGION, LEFT 11/18/2010   ANXIETY DEPRESSION 05/20/2009   SKIN TAG 04/19/2009   POSTMENOPAUSAL STATUS 12/28/2008   SINUSITIS- ACUTE-NOS 09/09/2008  ANXIETY 09/01/2008   GENERALIZED ANXIETY DISORDER 06/30/2008   INSOMNIA 04/24/2008   GOITER NOS 06/04/2007   NUMBNESS, ARM 06/04/2007   WEIGHT LOSS 06/04/2007   Hyperlipidemia LDL goal <100 02/07/2007   Essential hypertension 02/07/2007   ALLERGIC RHINITIS 02/07/2007   GERD 02/07/2007   COLONOSCOPY, HX OF 02/07/2007   DILATION AND CURETTAGE, HX OF 02/07/2007    Goals Addressed   None    Reviewed chart prior  to disease state call. Spoke with patient regarding BP  Recent Office Vitals: BP Readings from Last 3 Encounters:  05/11/20 132/80  04/13/20 (!) 182/90  03/31/20 (!) 172/84   Pulse Readings from Last 3 Encounters:  05/11/20 (!) 59  04/13/20 60  03/31/20 (!) 55    Wt Readings from Last 3 Encounters:  05/11/20 179 lb 9.6 oz (81.5 kg)  04/13/20 181 lb (82.1 kg)  03/31/20 182 lb (82.6 kg)     Kidney Function Lab Results  Component Value Date/Time   CREATININE 1.56 (H) 05/11/2020 02:34 PM   CREATININE 1.13 04/13/2020 02:33 PM   CREATININE 1.32 (H) 10/02/2019 11:36 AM   CREATININE 1.33 (H) 11/17/2016 04:02 PM   GFR 57.20 (L) 04/13/2020 02:33 PM   GFRNONAA 50 (L) 02/24/2015 01:45 PM   GFRAA 58 (L) 02/24/2015 01:45 PM    BMP Latest Ref Rng & Units 05/11/2020 04/13/2020 10/02/2019  Glucose 65 - 99 mg/dL 89 90 82  BUN 7 - 25 mg/dL 24 17 16   Creatinine 0.60 - 0.93 mg/dL 1.56(H) 1.13 1.32(H)  BUN/Creat Ratio 6 - 22 (calc) 15 - -  Sodium 135 - 146 mmol/L 138 136 139  Potassium 3.5 - 5.3 mmol/L 4.7 4.2 4.0  Chloride 98 - 110 mmol/L 103 100 104  CO2 20 - 32 mmol/L 26 31 30   Calcium 8.6 - 10.4 mg/dL 9.4 9.5 9.1    Current antihypertensive regimen:  Metoprolol tartrate 100 mg twice daily, Olmesartan 40 mg daily Hctz 25 mg as needed Amlodipine 5 mg daily   How often are you checking your Blood Pressure? weekly   Current home BP readings: 132/78 is the last reading provided to me by the patient from last week. She has not checked it yet this week.  What recent interventions/DTPs have been made by any provider to improve Blood Pressure control since last CPP Visit: None  Any recent hospitalizations or ED visits since last visit with CPP? No   What diet changes have been made to improve Blood Pressure Control?  None  What exercise is being done to improve your Blood Pressure Control?  Patient states she walks in the evenings.   Adherence Review: Is the patient currently on  ACE/ARB medication? Yes olmesartan 40 MG tablet Does the patient have >5 day gap between last estimated fill dates? Yes   Reviewed the patient's medication Adherence Data proceeding the Disease State call. Per the insurance data, patient is twenty one days past her refill pickup for olmesa medox 40 mg tab and rosuvastatin 20 mg receiving her medications through [facility].  Follow-Up:  Pharmacist Review   Fanny Skates, [place] Pharmacist Assistant [phone]  There appears to be a gap in fills, but patient should now be on track  Dispensed Days Supply Quantity Provider Pharmacy  OLMESARTAN MEDOXOMIL  40 MG TABS 08/03/2020 90 90 tablet LOWNE-CHASE,YVONNE [facility] ([facility]...  OLMESARTAN MEDOXOMIL  40 MG TABS 04/13/2020 90 90 tablet LOWNE-CHASE,YVONNE [facility] (Op...          Rosuvastatin Calcium  Dispensed Days Supply Quantity Provider Pharmacy  ROSUVASTATIN CALCIUM  20 MG TABS 08/03/2020 90 90 tablet LOWNE-CHASE,YVONNE [facility] (Op...  ROSUVASTATIN CALCIUM  20 MG TABS 04/13/2020 90 90 tablet LOWNE-CHASE,YVONNE [facility] (Op...        3 minutes spent in review, coordination, and documentation.   Reviewed by: De Blanch, PharmD, BCACP Clinical Pharmacist [facility] [phone]

## 2020-09-27 ENCOUNTER — Other Ambulatory Visit: Payer: Self-pay | Admitting: Family Medicine

## 2020-09-27 DIAGNOSIS — K219 Gastro-esophageal reflux disease without esophagitis: Secondary | ICD-10-CM

## 2020-09-28 ENCOUNTER — Other Ambulatory Visit: Payer: Self-pay | Admitting: Family Medicine

## 2020-09-28 DIAGNOSIS — I10 Essential (primary) hypertension: Secondary | ICD-10-CM

## 2020-09-28 DIAGNOSIS — E785 Hyperlipidemia, unspecified: Secondary | ICD-10-CM

## 2020-10-14 ENCOUNTER — Ambulatory Visit: Payer: Medicare Other | Admitting: Family Medicine

## 2020-10-19 DIAGNOSIS — M1711 Unilateral primary osteoarthritis, right knee: Secondary | ICD-10-CM | POA: Diagnosis not present

## 2020-10-22 ENCOUNTER — Telehealth: Payer: Self-pay | Admitting: Pharmacist

## 2020-10-22 NOTE — Progress Notes (Addendum)
Chronic Care Management Pharmacy Assistant   Name: Wendy Christensen  MRN: [id number] DOB: June 29, 1946  Reason for Encounter: Disease State For HTN.  Patient Questions:  1.  Have you seen any other providers since your last visit? No.   2.  Any changes in your medicines or health? No.   PCP : [name], DO   Their chronic conditions include: Hypertension, Hyperlipidemia, Pre-Diabetes, Asthma, Depression/Anxiety, GERD, Allergic Rhinitis.  Office Visits: None since 09/24/20  Consults: None since 09/24/20  Allergies:  No Known Allergies  Medications: Outpatient Encounter Medications as of 10/22/2020  Medication Sig   albuterol (PROAIR HFA) 108 (90 Base) MCG/ACT inhaler INHALE 2 PUFFS INTO THE LUNGS EVERY 4 HOURS AS NEEDED FOR WHEEZING   ALPRAZolam (XANAX) 0.25 MG tablet Take 1 tablet (0.25 mg total) by mouth 2 (two) times daily as needed for anxiety.   amLODipine (NORVASC) 5 MG tablet TAKE 1 TABLET(5 MG) BY MOUTH DAILY   citalopram (CELEXA) 40 MG tablet Take 1 tablet (40 mg total) by mouth every morning.   fenofibrate 160 MG tablet TAKE 1 TABLET BY MOUTH  DAILY   fluticasone (FLONASE) 50 MCG/ACT nasal spray Place 2 sprays into both nostrils as needed.   fluticasone furoate-vilanterol (BREO ELLIPTA) 200-25 MCG/INH AEPB Inhale 1 puff into the lungs daily. Per Dr. Donneta Romberg   hydrochlorothiazide (HYDRODIURIL) 25 MG tablet Take 1 tablet (25 mg total) by mouth daily. 1 po qd prn   metoprolol tartrate (LOPRESSOR) 100 MG tablet TAKE 1 TABLET BY MOUTH  TWICE DAILY   montelukast (SINGULAIR) 10 MG tablet Take 1 tablet (10 mg total) by mouth at bedtime.   NONFORMULARY OR COMPOUNDED ITEM Please dispense a blood pressure cuff to patient if approved through insurance.   nystatin cream (MYCOSTATIN) Apply 1 application topically 2 (two) times daily.   olmesartan (BENICAR) 40 MG tablet TAKE 1 TABLET BY MOUTH  DAILY   omeprazole (PRILOSEC) 20 MG capsule TAKE 1 CAPSULE BY MOUTH  DAILY    promethazine (PHENERGAN) 25 MG tablet Take 1 tablet (25 mg total) by mouth every 8 (eight) hours as needed for nausea or vomiting.   Propylene Glycol-Glycerin (MOISTURE EYES OP) Apply to eye.   rosuvastatin (CRESTOR) 20 MG tablet TAKE 1 TABLET BY MOUTH  DAILY   vitamin B-12 (CYANOCOBALAMIN) 1000 MCG tablet Take 1,000 mcg by mouth daily.   No facility-administered encounter medications on file as of 10/22/2020.    Current Diagnosis: Patient Active Problem List   Diagnosis Date Noted   History of asthma 04/13/2020   Preventative health care 04/13/2020   Urinary frequency 10/05/2019   Preop examination 07/22/2018   Tendon tear, ankle, right, initial encounter 07/16/2018   Candidiasis of vagina 04/23/2018   Uterine prolapse 04/23/2018   Mass of right axilla 04/18/2018   Chest pain 05/25/2017   History of colon polyps 05/25/2017   Hypertension 07/13/2015   Renal insufficiency 03/30/2015   Pain in the abdomen 02/24/2015   Intrinsic asthma 01/26/2015   Hyperglycemia 06/19/2013   Hypothyroidism 06/17/2013   OSA (obstructive sleep apnea) 01/14/2013   BACK PAIN, THORACIC REGION, LEFT 11/18/2010   ANXIETY DEPRESSION 05/20/2009   SKIN TAG 04/19/2009   POSTMENOPAUSAL STATUS 12/28/2008   SINUSITIS- ACUTE-NOS 09/09/2008   ANXIETY 09/01/2008   GENERALIZED ANXIETY DISORDER 06/30/2008   INSOMNIA 04/24/2008   GOITER NOS 06/04/2007   NUMBNESS, ARM 06/04/2007   WEIGHT LOSS 06/04/2007   Hyperlipidemia LDL goal <100 02/07/2007   Essential hypertension 02/07/2007  ALLERGIC RHINITIS 02/07/2007   GERD 02/07/2007   COLONOSCOPY, HX OF 02/07/2007   DILATION AND CURETTAGE, HX OF 02/07/2007    Goals Addressed   None    Reviewed chart prior to disease state call. Spoke with patient regarding BP  Recent Office Vitals: BP Readings from Last 3 Encounters:  05/11/20 132/80  04/13/20 (!) 182/90  03/31/20 (!) 172/84   Pulse Readings from Last 3 Encounters:  05/11/20 (!) 59  04/13/20 60   03/31/20 (!) 55    Wt Readings from Last 3 Encounters:  05/11/20 179 lb 9.6 oz (81.5 kg)  04/13/20 181 lb (82.1 kg)  03/31/20 182 lb (82.6 kg)     Kidney Function Lab Results  Component Value Date/Time   CREATININE 1.56 (H) 05/11/2020 02:34 PM   CREATININE 1.13 04/13/2020 02:33 PM   CREATININE 1.32 (H) 10/02/2019 11:36 AM   CREATININE 1.33 (H) 11/17/2016 04:02 PM   GFR 57.20 (L) 04/13/2020 02:33 PM   GFRNONAA 50 (L) 02/24/2015 01:45 PM   GFRAA 58 (L) 02/24/2015 01:45 PM    BMP Latest Ref Rng & Units 05/11/2020 04/13/2020 10/02/2019  Glucose 65 - 99 mg/dL 89 90 82  BUN 7 - 25 mg/dL 24 17 16   Creatinine 0.60 - 0.93 mg/dL 1.56(H) 1.13 1.32(H)  BUN/Creat Ratio 6 - 22 (calc) 15 - -  Sodium 135 - 146 mmol/L 138 136 139  Potassium 3.5 - 5.3 mmol/L 4.7 4.2 4.0  Chloride 98 - 110 mmol/L 103 100 104  CO2 20 - 32 mmol/L 26 31 30   Calcium 8.6 - 10.4 mg/dL 9.4 9.5 9.1    Current antihypertensive regimen:  Metoprolol tartrate 100 mg twice daily, Olmesartan 40 mg daily Hctz 25 mg as needed Amlodipine 5 mg daily  How often are you checking your Blood Pressure? 1-2x per week   Current home BP readings: 10/15/20 136/78 10/08/20 147/68 09/25/20 141/72  What recent interventions/DTPs have been made by any provider to improve Blood Pressure control since last CPP Visit: None.  Any recent hospitalizations or ED visits since last visit with CPP? No.  What diet changes have been made to improve Blood Pressure Control?  Patient stated she stays away from salt. She stated she eats vegetables and fruits, but doesn't eat much or has a big appetite. She stated she doesn't each much meat.  What exercise is being done to improve your Blood Pressure Control?  Patient stated she hasn't been exercising because of knee pain, she has recently got a injection in her knee to help improve her knee pain.  Adherence Review: Is the patient currently on ACE/ARB medication? Yes, Olmesartan 40 mg.  Does  the patient have >5 day gap between last estimated fill dates? No   Follow-Up:  Pharmacist Review   Charlann Lange, RMA Clinical Pharmacist Assistant [phone]  7 minutes spent in review, coordination, and documentation.  Reviewed by: Beverly Milch, PharmD Clinical Pharmacist [facility] [phone]

## 2020-10-28 ENCOUNTER — Ambulatory Visit (INDEPENDENT_AMBULATORY_CARE_PROVIDER_SITE_OTHER): Payer: Medicare Other | Admitting: Family Medicine

## 2020-10-28 ENCOUNTER — Other Ambulatory Visit: Payer: Self-pay

## 2020-10-28 ENCOUNTER — Encounter: Payer: Self-pay | Admitting: Family Medicine

## 2020-10-28 ENCOUNTER — Other Ambulatory Visit: Payer: Self-pay | Admitting: Family Medicine

## 2020-10-28 VITALS — BP 142/80 | HR 65 | Temp 98.3°F | Resp 18 | Wt 177.2 lb

## 2020-10-28 DIAGNOSIS — F419 Anxiety disorder, unspecified: Secondary | ICD-10-CM | POA: Diagnosis not present

## 2020-10-28 DIAGNOSIS — E785 Hyperlipidemia, unspecified: Secondary | ICD-10-CM

## 2020-10-28 DIAGNOSIS — Z79899 Other long term (current) drug therapy: Secondary | ICD-10-CM

## 2020-10-28 DIAGNOSIS — F411 Generalized anxiety disorder: Secondary | ICD-10-CM

## 2020-10-28 DIAGNOSIS — I10 Essential (primary) hypertension: Secondary | ICD-10-CM | POA: Diagnosis not present

## 2020-10-28 DIAGNOSIS — N1832 Chronic kidney disease, stage 3b: Secondary | ICD-10-CM

## 2020-10-28 DIAGNOSIS — G4733 Obstructive sleep apnea (adult) (pediatric): Secondary | ICD-10-CM | POA: Diagnosis not present

## 2020-10-28 LAB — LIPID PANEL
Cholesterol: 197 mg/dL (ref 0–200)
HDL: 69.5 mg/dL (ref >39.00)
LDL Cholesterol: 119 mg/dL — ABNORMAL HIGH (ref 0–99)
NonHDL: 127.98
Total CHOL/HDL Ratio: 3
Triglycerides: 47 mg/dL (ref 0.0–149.0)
VLDL: 9.4 mg/dL (ref 0.0–40.0)

## 2020-10-28 LAB — COMPREHENSIVE METABOLIC PANEL
ALT: 10 U/L (ref 0–35)
AST: 14 U/L (ref 0–37)
Albumin: 4.4 g/dL (ref 3.5–5.2)
Alkaline Phosphatase: 50 U/L (ref 39–117)
BUN: 23 mg/dL (ref 6–23)
CO2: 31 mEq/L (ref 19–32)
Calcium: 9.7 mg/dL (ref 8.4–10.5)
Chloride: 101 mEq/L (ref 96–112)
Creatinine, Ser: 1.56 mg/dL — ABNORMAL HIGH (ref 0.40–1.20)
GFR: 32.9 mL/min — ABNORMAL LOW (ref >60.00)
Glucose, Bld: 73 mg/dL (ref 70–99)
Potassium: 4.3 mEq/L (ref 3.5–5.1)
Sodium: 139 mEq/L (ref 135–145)
Total Bilirubin: 0.4 mg/dL (ref 0.2–1.2)
Total Protein: 7.8 g/dL (ref 6.0–8.3)

## 2020-10-28 NOTE — Patient Instructions (Addendum)

## 2020-10-28 NOTE — Addendum Note (Signed)
Addended by: Sanda Linger on: 10/28/2020 11:15 AM   Modules accepted: Orders

## 2020-10-28 NOTE — Assessment & Plan Note (Signed)
Well controlled, no changes to meds. Encouraged heart healthy diet such as the DASH diet and exercise as tolerated.  °

## 2020-10-28 NOTE — Assessment & Plan Note (Signed)
Encouraged heart healthy diet, increase exercise, avoid trans fats, consider a krill oil cap daily 

## 2020-10-28 NOTE — Progress Notes (Signed)
Patient ID: Wendy Christensen, female    DOB: 1946-08-15  Age: 75 y.o. MRN: [id number]    Subjective:  Subjective  HPI Wendy Christensen presents for f/u bp and anxiety no complaints  Review of Systems  Constitutional: Negative for appetite change, diaphoresis, fatigue and unexpected weight change.  Eyes: Negative for pain, redness and visual disturbance.  Respiratory: Negative for cough, chest tightness, shortness of breath and wheezing.   Cardiovascular: Negative for chest pain, palpitations and leg swelling.  Endocrine: Negative for cold intolerance, heat intolerance, polydipsia, polyphagia and polyuria.  Genitourinary: Negative for difficulty urinating, dysuria and frequency.  Neurological: Negative for dizziness, light-headedness, numbness and headaches.    History Past Medical History:  Diagnosis Date  . Allergic rhinitis   . Anxiety   . Asthma, intrinsic   . Depression   . Frequency of urination   . GERD (gastroesophageal reflux disease)   . Hyperlipidemia   . Hypertension   . Kidney stone    Left kidney-calcium   . OSA on CPAP    PT IN PROCESS OF GETTING MACHINE SET UP  . Thyroid goiter   . Urgency of urination   . Wears glasses     She has a past surgical history that includes Tubal ligation (1980's); Dilation and curettage of uterus (1990); Cystoscopy with retrograde pyelogram, ureteroscopy and stent placement (Left, 06/21/2015); Holmium laser application (Left, 76/10/8313); Stone extraction with basket (Left, 06/21/2015); Eye surgery; Cataract extraction, bilateral; and Breast biopsy.   Her family history includes Asthma in her father; Breast cancer in her cousin and cousin; COPD in her father; Heart attack in her maternal aunt; Heart disease in her mother; Hyperlipidemia in an other family member; Hypertension in her brother, brother, mother, and another family member; Kidney cancer in her maternal aunt; Stroke in her brother.She reports that she has never  smoked. She has never used smokeless tobacco. She reports current alcohol use. She reports that she does not use drugs.  Current Outpatient Medications on File Prior to Visit  Medication Sig Dispense Refill  . albuterol (PROAIR HFA) 108 (90 Base) MCG/ACT inhaler INHALE 2 PUFFS INTO THE LUNGS EVERY 4 HOURS AS NEEDED FOR WHEEZING 8.5 g 3  . ALPRAZolam (XANAX) 0.25 MG tablet Take 1 tablet (0.25 mg total) by mouth 2 (two) times daily as needed for anxiety. 20 tablet 0  . amLODipine (NORVASC) 5 MG tablet TAKE 1 TABLET(5 MG) BY MOUTH DAILY 30 tablet 2  . citalopram (CELEXA) 40 MG tablet Take 1 tablet (40 mg total) by mouth every morning. 90 tablet 3  . fenofibrate 160 MG tablet TAKE 1 TABLET BY MOUTH  DAILY 90 tablet 3  . fluticasone (FLONASE) 50 MCG/ACT nasal spray Place 2 sprays into both nostrils as needed. 16 g 3  . fluticasone furoate-vilanterol (BREO ELLIPTA) 200-25 MCG/INH AEPB Inhale 1 puff into the lungs daily. Per Dr. Donneta Romberg 3 each 3  . hydrochlorothiazide (HYDRODIURIL) 25 MG tablet Take 1 tablet (25 mg total) by mouth daily. 1 po qd prn 90 tablet 1  . metoprolol tartrate (LOPRESSOR) 100 MG tablet TAKE 1 TABLET BY MOUTH  TWICE DAILY 180 tablet 3  . montelukast (SINGULAIR) 10 MG tablet Take 1 tablet (10 mg total) by mouth at bedtime. 90 tablet 3  . NONFORMULARY OR COMPOUNDED ITEM Please dispense a blood pressure cuff to patient if approved through insurance. 1 each 1  . nystatin cream (MYCOSTATIN) Apply 1 application topically 2 (two) times daily. 30 g 0  . olmesartan (  BENICAR) 40 MG tablet TAKE 1 TABLET BY MOUTH  DAILY 90 tablet 3  . omeprazole (PRILOSEC) 20 MG capsule TAKE 1 CAPSULE BY MOUTH  DAILY 90 capsule 3  . promethazine (PHENERGAN) 25 MG tablet Take 1 tablet (25 mg total) by mouth every 8 (eight) hours as needed for nausea or vomiting. 20 tablet 0  . Propylene Glycol-Glycerin (MOISTURE EYES OP) Apply to eye.    . rosuvastatin (CRESTOR) 20 MG tablet TAKE 1 TABLET BY MOUTH  DAILY 90  tablet 3  . vitamin B-12 (CYANOCOBALAMIN) 1000 MCG tablet Take 1,000 mcg by mouth daily.     No current facility-administered medications on file prior to visit.     Objective:  Objective  Physical Exam Vitals and nursing note reviewed.  Constitutional:      Appearance: She is well-developed and well-nourished.  HENT:     Head: Normocephalic and atraumatic.  Eyes:     Extraocular Movements: EOM normal.     Conjunctiva/sclera: Conjunctivae normal.  Neck:     Thyroid: No thyromegaly.     Vascular: No carotid bruit or JVD.  Cardiovascular:     Rate and Rhythm: Normal rate and regular rhythm.     Heart sounds: Normal heart sounds. No murmur heard.   Pulmonary:     Effort: Pulmonary effort is normal. No respiratory distress.     Breath sounds: Normal breath sounds. No wheezing or rales.  Chest:     Chest wall: No tenderness.  Musculoskeletal:        General: No swelling, tenderness or edema.     Cervical back: Normal range of motion and neck supple.     Right lower leg: No edema.     Left lower leg: No edema.  Neurological:     Mental Status: She is alert and oriented to person, place, and time.  Psychiatric:        Mood and Affect: Mood and affect normal.    BP (!) 142/80 (BP Location: Left Arm, Patient Position: Sitting, Cuff Size: Normal)   Pulse 65   Temp 98.3 F (36.8 C) (Oral)   Resp 18   Wt 177 lb 3.2 oz (80.4 kg)   SpO2 98%   BMI 29.49 kg/m  Wt Readings from Last 3 Encounters:  10/28/20 177 lb 3.2 oz (80.4 kg)  05/11/20 179 lb 9.6 oz (81.5 kg)  04/13/20 181 lb (82.1 kg)     Lab Results  Component Value Date   WBC 7.0 05/11/2020   HGB 11.3 (L) 05/11/2020   HCT 35.0 05/11/2020   PLT 315 05/11/2020   GLUCOSE 89 05/11/2020   CHOL 172 05/11/2020   TRIG 78 05/11/2020   HDL 58 05/11/2020   LDLDIRECT 215.7 10/18/2011   LDLCALC 97 05/11/2020   ALT 11 05/11/2020   AST 17 05/11/2020   NA 138 05/11/2020   K 4.7 05/11/2020   CL 103 05/11/2020    CREATININE 1.56 (H) 05/11/2020   BUN 24 05/11/2020   CO2 26 05/11/2020   TSH 1.53 05/11/2020   HGBA1C 6.4 09/14/2014   MICROALBUR <0.7 03/23/2015    US BREAST LTD UNI LEFT INC AXILLA  Result Date: 09/09/2020 CLINICAL DATA:  Possible mass in the anterior aspect of the outer left breast on a recent screening mammogram. EXAM: DIGITAL DIAGNOSTIC LEFT MAMMOGRAM WITH TOMO ULTRASOUND LEFT BREAST COMPARISON:  Previous exam(s). ACR Breast Density Category b: There are scattered areas of fibroglandular density. FINDINGS: 3D tomographic and 2D generated spot compression images of  the left breast confirm a small, oval, circumscribed mass containing 2 small, oval, dependent calcifications in the anterior aspect of the breast laterally. Targeted ultrasound is performed, showing a 4 x 3 x 3 mm cyst containing some internal debris and dependent calcification in the 3 o'clock position of the left breast, 2 cm from the nipple. This corresponds to the mammographic mass. No internal blood flow was seen with power Doppler. IMPRESSION: Benign left breast cyst.  No evidence of malignancy. RECOMMENDATION: Bilateral screening mammogram in 1 year. I have discussed the findings and recommendations with the patient. If applicable, a reminder letter will be sent to the patient regarding the next appointment. BI-RADS CATEGORY  2: Benign. Electronically Signed   By: Claudie Revering M.D.   On: 09/09/2020 11:54   MM DIAG BREAST TOMO UNI LEFT  Result Date: 09/09/2020 CLINICAL DATA:  Possible mass in the anterior aspect of the outer left breast on a recent screening mammogram. EXAM: DIGITAL DIAGNOSTIC LEFT MAMMOGRAM WITH TOMO ULTRASOUND LEFT BREAST COMPARISON:  Previous exam(s). ACR Breast Density Category b: There are scattered areas of fibroglandular density. FINDINGS: 3D tomographic and 2D generated spot compression images of the left breast confirm a small, oval, circumscribed mass containing 2 small, oval, dependent calcifications  in the anterior aspect of the breast laterally. Targeted ultrasound is performed, showing a 4 x 3 x 3 mm cyst containing some internal debris and dependent calcification in the 3 o'clock position of the left breast, 2 cm from the nipple. This corresponds to the mammographic mass. No internal blood flow was seen with power Doppler. IMPRESSION: Benign left breast cyst.  No evidence of malignancy. RECOMMENDATION: Bilateral screening mammogram in 1 year. I have discussed the findings and recommendations with the patient. If applicable, a reminder letter will be sent to the patient regarding the next appointment. BI-RADS CATEGORY  2: Benign. Electronically Signed   By: Claudie Revering M.D.   On: 09/09/2020 11:54     Assessment & Plan:  Plan  I am having [name] maintain her vitamin B-12, Propylene Glycol-Glycerin (MOISTURE EYES OP), promethazine, fluticasone, ALPRAZolam, NONFORMULARY OR COMPOUNDED ITEM, albuterol, citalopram, Breo Ellipta, hydrochlorothiazide, montelukast, nystatin cream, amLODipine, omeprazole, metoprolol tartrate, olmesartan, fenofibrate, and rosuvastatin.  No orders of the defined types were placed in this encounter.   Problem List Items Addressed This Visit      Unprioritized   Essential hypertension    Well controlled, no changes to meds. Encouraged heart healthy diet such as the DASH diet and exercise as tolerated.       GENERALIZED ANXIETY DISORDER    Stable con't celexa and xanax uds and contract updated      Hyperlipidemia LDL goal <100    Encouraged heart healthy diet, increase exercise, avoid trans fats, consider a krill oil cap daily      Hypertension   Relevant Orders   Lipid panel   Comprehensive metabolic panel    Other Visit Diagnoses    Hyperlipidemia, unspecified hyperlipidemia type    -  Primary   Relevant Orders   Lipid panel   Comprehensive metabolic panel   Anxiety          Follow-up: Return in about 6 months (around 04/27/2021) for  annual exam, fasting---  and awv with RN.  [name], DO

## 2020-10-28 NOTE — Assessment & Plan Note (Signed)
Stable con't celexa and xanax uds and contract updated

## 2020-10-31 LAB — DRUG MONITORING, PANEL 8 WITH CONFIRMATION, URINE
6 Acetylmorphine: NEGATIVE ng/mL (ref <10)
Alcohol Metabolites: NEGATIVE ng/mL
Amphetamines: NEGATIVE ng/mL (ref <500)
Benzodiazepines: NEGATIVE ng/mL (ref <100)
Buprenorphine, Urine: NEGATIVE ng/mL (ref <5)
Cocaine Metabolite: NEGATIVE ng/mL (ref <150)
Creatinine: 225.8 mg/dL
MDA: NEGATIVE ng/mL (ref <200)
MDMA: NEGATIVE ng/mL (ref <200)
MDMA: NEGATIVE ng/mL (ref <500)
Marijuana Metabolite: NEGATIVE ng/mL (ref <20)
Opiates: NEGATIVE ng/mL (ref <100)
Oxidant: NEGATIVE ug/mL
Oxycodone: NEGATIVE ng/mL (ref <100)
pH: 7 (ref 4.5–9.0)

## 2020-10-31 LAB — DM TEMPLATE

## 2020-11-29 NOTE — Progress Notes (Incomplete)
Chronic Care Management Pharmacy Note  11/29/2020 Name:  Wendy Christensen MRN:  [id number] DOB:  [birth year]  Subjective: Wendy Christensen is an 75 y.o. year old female who is a primary patient of [name], DO.  The CCM team was consulted for assistance with disease management and care coordination needs.    Engaged with patient by telephone for follow up visit in response to provider referral for pharmacy case management and/or care coordination services.   Consent to Services:  The patient was given the following information about Chronic Care Management services today, agreed to services, and gave verbal consent: 1. CCM service includes personalized support from designated clinical staff supervised by the primary care provider, including individualized plan of care and coordination with other care providers 2. 24/7 contact phone numbers for assistance for urgent and routine care needs. 3. Service will only be billed when office clinical staff spend 20 minutes or more in a month to coordinate care. 4. Only one practitioner may furnish and bill the service in a calendar month. 5.The patient may stop CCM services at any time (effective at the end of the month) by phone call to the office staff. 6. The patient will be responsible for cost sharing (co-pay) of up to 20% of the service fee (after annual deductible is met). Patient agreed to services and consent obtained.  Patient Care Team: Carollee Herter, Alferd Apa, DO as PCP - General Inda Castle, MD (Inactive) as Consulting Physician (Gastroenterology) Rexene Agent, MD as Attending Physician (Nephrology) Mosetta Anis, MD as Referring Physician (Allergy) Alanda Slim Neena Rhymes, MD as Consulting Physician (Ophthalmology) Day, Melvenia Beam, [facility] (Inactive) as Pharmacist (Pharmacist) Sanjuana Kava, MD as Referring Physician (Obstetrics and Gynecology) Pedro Earls, MD as Attending Physician (Family Medicine)  Recent office  visits: 10/28/20 Wendy Christensen) - regular check up no medication changes noted  Recent consult visits: None recent  Hospital visits: None in previous 6 months  Objective:  Lab Results  Component Value Date   CREATININE 1.56 (H) 10/28/2020   BUN 23 10/28/2020   GFR 32.90 (L) 10/28/2020   GFRNONAA 50 (L) 02/24/2015   GFRAA 58 (L) 02/24/2015   NA 139 10/28/2020   K 4.3 10/28/2020   CALCIUM 9.7 10/28/2020   CO2 31 10/28/2020    Lab Results  Component Value Date/Time   HGBA1C 6.4 09/14/2014 12:01 PM   HGBA1C 6.4 06/19/2013 08:53 AM   GFR 32.90 (L) 10/28/2020 09:31 AM   GFR 57.20 (L) 04/13/2020 02:33 PM   MICROALBUR <0.7 03/23/2015 09:58 AM   MICROALBUR 0.4 09/14/2014 12:01 PM    Last diabetic Eye exam: No results found for: HMDIABEYEEXA  Last diabetic Foot exam: No results found for: HMDIABFOOTEX   Lab Results  Component Value Date   CHOL 197 10/28/2020   HDL 69.50 10/28/2020   LDLCALC 119 (H) 10/28/2020   LDLDIRECT 215.7 10/18/2011   TRIG 47.0 10/28/2020   CHOLHDL 3 10/28/2020    Hepatic Function Latest Ref Rng & Units 10/28/2020 05/11/2020 04/13/2020  Total Protein 6.0 - 8.3 g/dL 7.8 7.4 7.4  Albumin 3.5 - 5.2 g/dL 4.4 - 4.2  AST 0 - 37 U/L '14 17 15  ' ALT 0 - 35 U/L '10 11 11  ' Alk Phosphatase 39 - 117 U/L 50 - 61  Total Bilirubin 0.2 - 1.2 mg/dL 0.4 0.4 0.4  Bilirubin, Direct 0.0 - 0.3 mg/dL - - -    Lab Results  Component Value Date/Time   TSH 1.53  05/11/2020 02:34 PM   TSH 1.24 11/17/2016 04:04 PM   FREET4 0.75 06/17/2013 10:44 AM   FREET4 0.83 11/18/2010 09:30 AM    CBC Latest Ref Rng & Units 05/11/2020 07/22/2018 11/17/2016  WBC 3.8 - 10.8 Thousand/uL 7.0 6.1 6.4  Hemoglobin 11.7 - 15.5 g/dL 11.3(L) 11.2(L) 12.0  Hematocrit 35.0 - 45.0 % 35.0 34.7(L) 37.5  Platelets 140 - 400 Thousand/uL 315 271.0 294    Lab Results  Component Value Date/Time   VD25OH 47 05/11/2020 02:34 PM   VD25OH 32 03/24/2010 12:00 AM    Clinical ASCVD: {YES/NO:21197} The 10-year  ASCVD risk score Mikey Bussing DC Jr., et al., 2013) is: 17.1%   Values used to calculate the score:     Age: 37 years     Sex: Female     Is Non-Hispanic African American: Yes     Diabetic: No     Tobacco smoker: No     Systolic Blood Pressure: 128 mmHg     Is BP treated: Yes     HDL Cholesterol: 69.5 mg/dL     Total Cholesterol: 197 mg/dL    Depression screen [facility] 2/9 01/30/2020 03/08/2018 11/17/2016  Decreased Interest 3 0 0  Down, Depressed, Hopeless 0 0 0  PHQ - 2 Score 3 0 0  Altered sleeping 3 - -  Tired, decreased energy 1 - -  Change in appetite 0 - -  Feeling bad or failure about yourself  1 - -  Trouble concentrating 1 - -  Moving slowly or fidgety/restless 0 - -  Suicidal thoughts 0 - -  PHQ-9 Score 9 - -  Some recent data might be hidden     ***Other: (CHADS2VASc if Afib, MMRC or CAT for COPD, ACT, DEXA)  Social History   Tobacco Use  Smoking Status Never Smoker  Smokeless Tobacco Never Used   BP Readings from Last 3 Encounters:  10/28/20 (!) 142/80  05/11/20 132/80  04/13/20 (!) 182/90   Pulse Readings from Last 3 Encounters:  10/28/20 65  05/11/20 (!) 59  04/13/20 60   Wt Readings from Last 3 Encounters:  10/28/20 177 lb 3.2 oz (80.4 kg)  05/11/20 179 lb 9.6 oz (81.5 kg)  04/13/20 181 lb (82.1 kg)    Assessment/Interventions: Review of patient past medical history, allergies, medications, health status, including review of consultants reports, laboratory and other test data, was performed as part of comprehensive evaluation and provision of chronic care management services.   SDOH:  (Social Determinants of Health) assessments and interventions performed: {yes/no:20286}   CCM Care Plan  No Known Allergies  Medications Reviewed Today    Reviewed by [name], DO (Physician) on 10/28/20 at [phone]  Med List Status: <None>  Medication Order Taking? Sig Documenting Provider Last Dose Status Informant  albuterol (PROAIR HFA) 108 (90 Base) MCG/ACT  inhaler [id number] No INHALE 2 PUFFS INTO THE LUNGS EVERY 4 HOURS AS NEEDED FOR WHEEZING Lowne Koren Shiver, DO Taking Active   ALPRAZolam Duanne Moron) 0.25 MG tablet [id number] No Take 1 tablet (0.25 mg total) by mouth 2 (two) times daily as needed for anxiety. Carollee Herter, Kendrick Fries R, DO Taking Active   amLODipine (NORVASC) 5 MG tablet [id number]  TAKE 1 TABLET(5 MG) BY MOUTH DAILY Carollee Herter, Alferd Apa, DO  Active   citalopram (CELEXA) 40 MG tablet [id number] No Take 1 tablet (40 mg total) by mouth every morning. [name], [place] Taking Active   fenofibrate 160 MG tablet [id number]  TAKE 1 TABLET BY MOUTH  DAILY Carollee Herter, Alferd Apa, DO  Active   fluticasone (FLONASE) 50 MCG/ACT nasal spray [id number] No Place 2 sprays into both nostrils as needed. Carollee Herter, Kendrick Fries R, DO Taking Active   fluticasone furoate-vilanterol (BREO ELLIPTA) 200-25 MCG/INH AEPB [id number] No Inhale 1 puff into the lungs daily. Per Dr. Jonette Eva, DO Taking Active   hydrochlorothiazide (HYDRODIURIL) 25 MG tablet [id number] No Take 1 tablet (25 mg total) by mouth daily. 1 po qd prn Roma Schanz R, DO Taking Active   metoprolol tartrate (LOPRESSOR) 100 MG tablet [id number]  TAKE 1 TABLET BY MOUTH  TWICE DAILY Carollee Herter, Yvonne R, DO  Active   montelukast (SINGULAIR) 10 MG tablet [id number] No Take 1 tablet (10 mg total) by mouth at bedtime. [name], DO Taking Active   NONFORMULARY OR COMPOUNDED ITEM [id number] No Please dispense a blood pressure cuff to patient if approved through insurance. [name], DO Taking Active   nystatin cream (MYCOSTATIN) [id number] No Apply 1 application topically 2 (two) times daily. Carollee Herter, Kendrick Fries R, DO Taking Active   olmesartan (BENICAR) 40 MG tablet [id number]  TAKE 1 TABLET BY MOUTH  DAILY Carollee Herter, Alferd Apa, DO  Active   omeprazole (PRILOSEC) 20 MG capsule [id number]  TAKE 1 CAPSULE BY MOUTH  DAILY Carollee Herter, Alferd Apa, DO  Active    promethazine (PHENERGAN) 25 MG tablet [id number] No Take 1 tablet (25 mg total) by mouth every 8 (eight) hours as needed for nausea or vomiting. Landis Martins, DPM Taking Active   Propylene Glycol-Glycerin (MOISTURE EYES OP) [id number] No Apply to eye. [provider] Taking Active   rosuvastatin (CRESTOR) 20 MG tablet [id number]  TAKE 1 TABLET BY MOUTH  DAILY Carollee Herter, Alferd Apa, DO  Active   vitamin B-12 (CYANOCOBALAMIN) 1000 MCG tablet [id number] No Take 1,000 mcg by mouth daily. [provider] Taking Active           Patient Active Problem List   Diagnosis Date Noted  . History of asthma 04/13/2020  . Preventative health care 04/13/2020  . Urinary frequency 10/05/2019  . Preop examination 07/22/2018  . Tendon tear, ankle, right, initial encounter 07/16/2018  . Candidiasis of vagina 04/23/2018  . Uterine prolapse 04/23/2018  . Mass of right axilla 04/18/2018  . Chest pain 05/25/2017  . History of colon polyps 05/25/2017  . Hypertension 07/13/2015  . Renal insufficiency 03/30/2015  . Pain in the abdomen 02/24/2015  . Intrinsic asthma 01/26/2015  . Hyperglycemia 06/19/2013  . Hypothyroidism 06/17/2013  . OSA (obstructive sleep apnea) 01/14/2013  . BACK PAIN, THORACIC REGION, LEFT 11/18/2010  . ANXIETY DEPRESSION 05/20/2009  . SKIN TAG 04/19/2009  . POSTMENOPAUSAL STATUS 12/28/2008  . SINUSITIS- ACUTE-NOS 09/09/2008  . ANXIETY 09/01/2008  . GENERALIZED ANXIETY DISORDER 06/30/2008  . INSOMNIA 04/24/2008  . GOITER NOS 06/04/2007  . NUMBNESS, ARM 06/04/2007  . WEIGHT LOSS 06/04/2007  . Hyperlipidemia LDL goal <100 02/07/2007  . Essential hypertension 02/07/2007  . ALLERGIC RHINITIS 02/07/2007  . GERD 02/07/2007  . COLONOSCOPY, HX OF 02/07/2007  . DILATION AND CURETTAGE, HX OF 02/07/2007    Immunization History  Administered Date(s) Administered  . Influenza Split 05/29/2012, 06/17/2013  . Influenza Whole 07/31/2007  . Influenza, High Dose Seasonal  PF 07/02/2015, 05/24/2017, 07/16/2018  . Influenza-Unspecified 08/07/2014, 06/19/2016  . PFIZER(Purple Top)SARS-COV-2 Vaccination 11/11/2019, 12/02/2019, 07/17/2020  . Pneumococcal Conjugate-13 07/07/2015  . Pneumococcal Polysaccharide-23 12/16/2012  .  Td 04/18/2005  . Zoster 04/24/2008    Conditions to be addressed/monitored:   Hypertension, Hyperlipidemia, Pre-Diabetes, Asthma, Depression/Anxiety, GERD, Allergic Rhinitis  There are no care plans that you recently modified to display for this patient.    Medication Assistance: {MEDASSISTANCEINFO:25044}  Patient's preferred pharmacy is:  [facility] [id number] [place] [address] Phone: [phone] Fax: [phone]  [facility], [address], [address], [address] [place] [address] Phone: [phone] Fax: [phone]  Uses pill box? {Yes or If no, why not?:20788} Pt endorses ***% compliance  We discussed: {Pharmacy options:24294} Patient decided to: {US Pharmacy Plan:23885}  Care Plan and Follow Up Patient Decision:  {FOLLOWUP:24991}  Plan: {CM FOLLOW UP PLAN:25073}  *** Current Barriers:  . {pharmacybarriers:24917} . ***  Pharmacist Clinical Goal(s):  . patient will {PHARMACYGOALCHOICES:24921} through collaboration with PharmD and provider.  . ***  Interventions: . 1:1 collaboration with Carollee Herter, Alferd Apa, DO regarding development and update of comprehensive plan of care as evidenced by provider attestation and co-signature . Inter-disciplinary care team collaboration (see longitudinal plan of care) . Comprehensive medication review performed; medication list updated in electronic medical record  Hypertension (BP goal {CHL HP UPSTREAM Pharmacist BP ranges:[phone]}) -{US controlled/uncontrolled:25276} -Current treatment:  Metoprolol  tartrate 158m twice daily,  Olmesartan 420mdaily  Hctz 2563ms needed  Amlodipine 5mg40mily -Medications previously tried: ***  -Current home readings: *** -Current dietary habits: *** -Current exercise habits: *** -{ACTIONS;DENIES/REPORTS:21021675::"Denies"} hypotensive/hypertensive symptoms -Educated on {CCM BP Counseling:25124} -Counseled to monitor BP at home ***, document, and provide log at future appointments -{CCMPHARMDINTERVENTION:25122}  Hyperlipidemia: (LDL goal < ***) -{US controlled/uncontrolled:25276} -Current treatment: . *** -Medications previously tried: ***  -Current dietary patterns: *** -Current exercise habits: *** -Educated on {CCM HLD Counseling:25126} -{CCMPHARMDINTERVENTION:25122}  Pre-Diabetes (A1c goal {A1c goals:23924}) -{US controlled/uncontrolled:25276} -Current medications: . *** -Medications previously tried: ***  -Current home glucose readings . fasting glucose: *** . post prandial glucose: *** -{ACTIONS;DENIES/REPORTS:21021675::"Denies"} hypoglycemic/hyperglycemic symptoms -Current meal patterns:  . breakfast: ***  . lunch: ***  . dinner: *** . snacks: *** . drinks: *** -Current exercise: *** -Educated on {CCM DM COUNSELING:25123} -Counseled to check feet daily and get yearly eye exams -{CCMPHARMDINTERVENTION:25122}   Patient Goals/Self-Care Activities . patient will:  - {pharmacypatientgoals:24919}  Follow Up Plan: {CM FOLLOW UP PLANVQQV:95638}

## 2020-12-02 ENCOUNTER — Encounter: Payer: Self-pay | Admitting: Gastroenterology

## 2020-12-03 ENCOUNTER — Telehealth: Payer: Medicare Other

## 2020-12-08 ENCOUNTER — Other Ambulatory Visit: Payer: Self-pay

## 2020-12-08 DIAGNOSIS — F411 Generalized anxiety disorder: Secondary | ICD-10-CM

## 2020-12-08 MED ORDER — CITALOPRAM HYDROBROMIDE 40 MG PO TABS: 40.0000 mg | ORAL_TABLET | Freq: Every morning | ORAL | 3 refills | Status: DC

## 2020-12-09 ENCOUNTER — Telehealth: Payer: Medicare Other

## 2020-12-09 NOTE — Progress Notes (Deleted)
Chronic Care Management Pharmacy Note  12/09/2020 Name:  Wendy Christensen MRN:  [id number] DOB:  04/22/1946  Subjective: Wendy Christensen is an 75 y.o. year old female who is a primary patient of [name], DO.  The CCM team was consulted for assistance with disease management and care coordination needs.    Engaged with patient by telephone for follow up visit in response to provider referral for pharmacy case management and/or care coordination services.   Consent to Services:  The patient was given information about Chronic Care Management services, agreed to services, and gave verbal consent prior to initiation of services.  Please see initial visit note for detailed documentation.   Patient Care Team: Carollee Herter, Alferd Apa, DO as PCP - General Inda Castle, MD (Inactive) as Consulting Physician (Gastroenterology) Rexene Agent, MD as Attending Physician (Nephrology) Mosetta Anis, MD as Referring Physician (Allergy) Alanda Slim Neena Rhymes, MD as Consulting Physician (Ophthalmology) Day, Melvenia Beam, [facility] (Inactive) as Pharmacist (Pharmacist) Sanjuana Kava, MD as Referring Physician (Obstetrics and Gynecology) Pedro Earls, MD as Attending Physician (Family Medicine)  Recent office visits: 10/28/2020 - PCP (Dr [name]): seen for hyperlipidemia LDL goal <100; Lipids checked and LDL was 119. Dr [name] encouraged heart healthy diet, increase exercise, avoid trans fats, consider a krill oil cap daily. Contineu fenofibrate. No med changes. RTC in 6 months.   Recent consult visits: None in last 6 months  Hospital visits: None in previous 6 months  Objective:  Lab Results  Component Value Date   CREATININE 1.56 (H) 10/28/2020   CREATININE 1.56 (H) 05/11/2020   CREATININE 1.13 04/13/2020    Lab Results  Component Value Date   HGBA1C 6.4 09/14/2014   Last diabetic Eye exam: No results found for: HMDIABEYEEXA  Last diabetic Foot exam: No results found  for: HMDIABFOOTEX      Component Value Date/Time   CHOL 197 10/28/2020 0931   TRIG 47.0 10/28/2020 0931   HDL 69.50 10/28/2020 0931   CHOLHDL 3 10/28/2020 0931   VLDL 9.4 10/28/2020 0931   LDLCALC 119 (H) 10/28/2020 0931   LDLCALC 97 05/11/2020 1434   LDLDIRECT 215.7 10/18/2011 0852    Hepatic Function Latest Ref Rng & Units 10/28/2020 05/11/2020 04/13/2020  Total Protein 6.0 - 8.3 g/dL 7.8 7.4 7.4  Albumin 3.5 - 5.2 g/dL 4.4 - 4.2  AST 0 - 37 U/L _0 ALT 0 - 35 U/L _1 Alk Phosphatase 39 - 117 U/L 50 - 61  Total Bilirubin 0.2 - 1.2 mg/dL 0.4 0.4 0.4  Bilirubin, Direct 0.0 - 0.3 mg/dL - - -    Lab Results  Component Value Date/Time   TSH 1.53 05/11/2020 02:34 PM   TSH 1.24 11/17/2016 04:04 PM   FREET4 0.75 06/17/2013 10:44 AM   FREET4 0.83 11/18/2010 09:30 AM    CBC Latest Ref Rng & Units 05/11/2020 07/22/2018 11/17/2016  WBC 3.8 - 10.8 Thousand/uL 7.0 6.1 6.4  Hemoglobin 11.7 - 15.5 g/dL 11.3(L) 11.2(L) 12.0  Hematocrit 35.0 - 45.0 % 35.0 34.7(L) 37.5  Platelets 140 - 400 Thousand/uL 315 271.0 294    Lab Results  Component Value Date/Time   VD25OH 47 05/11/2020 02:34 PM   VD25OH 32 03/24/2010 12:00 AM    Clinical ASCVD: {YES/NO:21197} The 10-year ASCVD risk score Mikey Bussing DC Jr., et al., 2013) is: 17.1%   Values used to calculate the score:     Age: 50 years  Sex: Female     Is Non-Hispanic African American: Yes     Diabetic: No     Tobacco smoker: No     Systolic Blood Pressure: 973 mmHg     Is BP treated: Yes     HDL Cholesterol: 69.5 mg/dL     Total Cholesterol: 197 mg/dL    Other: (CHADS2VASc if Afib, PHQ9 if depression, MMRC or CAT for COPD, ACT, DEXA)  Social History   Tobacco Use  Smoking Status Never Smoker  Smokeless Tobacco Never Used   BP Readings from Last 3 Encounters:  10/28/20 (!) 142/80  05/11/20 132/80  04/13/20 (!) 182/90   Pulse Readings from Last 3 Encounters:  10/28/20 65  05/11/20 (!) 59  04/13/20 60   Wt Readings  from Last 3 Encounters:  10/28/20 177 lb 3.2 oz (80.4 kg)  05/11/20 179 lb 9.6 oz (81.5 kg)  04/13/20 181 lb (82.1 kg)    Assessment: Review of patient past medical history, allergies, medications, health status, including review of consultants reports, laboratory and other test data, was performed as part of comprehensive evaluation and provision of chronic care management services.   SDOH:  (Social Determinants of Health) assessments and interventions performed:    CCM Care Plan  No Known Allergies  Medications Reviewed Today    Reviewed by [name], DO (Physician) on 10/28/20 at [phone]  Med List Status: <None>  Medication Order Taking? Sig Documenting Provider Last Dose Status Informant  albuterol (PROAIR HFA) 108 (90 Base) MCG/ACT inhaler [id number] No INHALE 2 PUFFS INTO THE LUNGS EVERY 4 HOURS AS NEEDED FOR WHEEZING Lowne Koren Shiver, DO Taking Active   ALPRAZolam Duanne Moron) 0.25 MG tablet [id number] No Take 1 tablet (0.25 mg total) by mouth 2 (two) times daily as needed for anxiety. Carollee Herter, Kendrick Fries R, DO Taking Active   amLODipine (NORVASC) 5 MG tablet [id number]  TAKE 1 TABLET(5 MG) BY MOUTH DAILY Carollee Herter, Alferd Apa, DO  Active   citalopram (CELEXA) 40 MG tablet [id number] No Take 1 tablet (40 mg total) by mouth every morning. [name], [place] Taking Active   fenofibrate 160 MG tablet [id number]  TAKE 1 TABLET BY MOUTH  DAILY Carollee Herter, Alferd Apa, DO  Active   fluticasone (FLONASE) 50 MCG/ACT nasal spray [id number] No Place 2 sprays into both nostrils as needed. Carollee Herter, Kendrick Fries R, DO Taking Active   fluticasone furoate-vilanterol (BREO ELLIPTA) 200-25 MCG/INH AEPB [id number] No Inhale 1 puff into the lungs daily. Per Dr. Jonette Eva, DO Taking Active   hydrochlorothiazide (HYDRODIURIL) 25 MG tablet [id number] No Take 1 tablet (25 mg total) by mouth daily. 1 po qd prn Roma Schanz R, DO Taking Active   metoprolol tartrate (LOPRESSOR)  100 MG tablet [id number]  TAKE 1 TABLET BY MOUTH  TWICE DAILY Carollee Herter, Yvonne R, DO  Active   montelukast (SINGULAIR) 10 MG tablet [id number] No Take 1 tablet (10 mg total) by mouth at bedtime. [name], DO Taking Active   NONFORMULARY OR COMPOUNDED ITEM [id number] No Please dispense a blood pressure cuff to patient if approved through insurance. [name], DO Taking Active   nystatin cream (MYCOSTATIN) [id number] No Apply 1 application topically 2 (two) times daily. Carollee Herter, Kendrick Fries R, DO Taking Active   olmesartan (BENICAR) 40 MG tablet [id number]  TAKE 1 TABLET BY MOUTH  DAILY Carollee Herter, Alferd Apa, DO  Active   omeprazole (PRILOSEC) 20 MG  capsule [id number]  TAKE 1 CAPSULE BY MOUTH  DAILY Carollee Herter, Alferd Apa, DO  Active   promethazine (PHENERGAN) 25 MG tablet [id number] No Take 1 tablet (25 mg total) by mouth every 8 (eight) hours as needed for nausea or vomiting. Landis Martins, DPM Taking Active   Propylene Glycol-Glycerin (MOISTURE EYES OP) [id number] No Apply to eye. [provider] Taking Active   rosuvastatin (CRESTOR) 20 MG tablet [id number]  TAKE 1 TABLET BY MOUTH  DAILY Carollee Herter, Alferd Apa, DO  Active   vitamin B-12 (CYANOCOBALAMIN) 1000 MCG tablet [id number] No Take 1,000 mcg by mouth daily. [provider] Taking Active           Patient Active Problem List   Diagnosis Date Noted  . History of asthma 04/13/2020  . Preventative health care 04/13/2020  . Urinary frequency 10/05/2019  . Preop examination 07/22/2018  . Tendon tear, ankle, right, initial encounter 07/16/2018  . Candidiasis of vagina 04/23/2018  . Uterine prolapse 04/23/2018  . Mass of right axilla 04/18/2018  . Chest pain 05/25/2017  . History of colon polyps 05/25/2017  . Hypertension 07/13/2015  . Renal insufficiency 03/30/2015  . Pain in the abdomen 02/24/2015  . Intrinsic asthma 01/26/2015  . Hyperglycemia 06/19/2013  . Hypothyroidism 06/17/2013  . OSA  (obstructive sleep apnea) 01/14/2013  . BACK PAIN, THORACIC REGION, LEFT 11/18/2010  . ANXIETY DEPRESSION 05/20/2009  . SKIN TAG 04/19/2009  . POSTMENOPAUSAL STATUS 12/28/2008  . SINUSITIS- ACUTE-NOS 09/09/2008  . ANXIETY 09/01/2008  . GENERALIZED ANXIETY DISORDER 06/30/2008  . INSOMNIA 04/24/2008  . GOITER NOS 06/04/2007  . NUMBNESS, ARM 06/04/2007  . WEIGHT LOSS 06/04/2007  . Hyperlipidemia LDL goal <100 02/07/2007  . Essential hypertension 02/07/2007  . ALLERGIC RHINITIS 02/07/2007  . GERD 02/07/2007  . COLONOSCOPY, HX OF 02/07/2007  . DILATION AND CURETTAGE, HX OF 02/07/2007    Immunization History  Administered Date(s) Administered  . Influenza Split 05/29/2012, 06/17/2013  . Influenza Whole 07/31/2007  . Influenza, High Dose Seasonal PF 07/02/2015, 05/24/2017, 07/16/2018  . Influenza-Unspecified 08/07/2014, 06/19/2016  . PFIZER(Purple Top)SARS-COV-2 Vaccination 11/11/2019, 12/02/2019, 07/17/2020  . Pneumococcal Conjugate-13 07/07/2015  . Pneumococcal Polysaccharide-23 12/16/2012  . Td 04/18/2005  . Zoster 04/24/2008    Conditions to be addressed/monitored: {CCM ASSESSMENT DISEASE OPTIONS:25047}  There are no care plans that you recently modified to display for this patient.   Medication Assistance: {MEDASSISTANCEINFO:25044}  Patient's preferred pharmacy is:  [id number] [address], [address] [place] [address] Phone: [phone] Fax: [phone]  [place], [address] [place], [address] [place], [address] [place] [address] Phone: [phone] Fax: [phone]  Uses pill box? {Yes or If no, why not?:20788} Pt endorses ***% compliance  Follow Up:  {FOLLOWUP:24991}  Plan: {CM FOLLOW UP PLAN:25073}  SIG***

## 2020-12-10 ENCOUNTER — Telehealth: Payer: Medicare Other

## 2020-12-15 ENCOUNTER — Telehealth: Payer: Self-pay | Admitting: Pharmacist

## 2020-12-15 NOTE — Progress Notes (Addendum)
    Chronic Care Management Pharmacy Assistant   Name: Wendy Christensen  MRN: [id number] DOB: July 02, 1946  Reason for Encounter: Adherence Review  Verified Adherence Gap Information. Per insurance data patient has met their colorectal cancer and their wellness bundle screening but the patients has not met their annual wellness screening. Their most recent blood pressure was 132/80 on 05/11/20. The patient has met goal when it comes to their blood pressure goal with staying below 140/90.The patients total gaps-all measures were equal to 2 and their most total gaps-star measures were equal to 1.   Follow-Up:Pharmacist Review  Charlann Lange, [place] Pharmacist Assistant [phone]

## 2020-12-17 ENCOUNTER — Telehealth: Payer: Medicare Other

## 2020-12-24 ENCOUNTER — Telehealth: Payer: Medicare Other

## 2021-01-05 ENCOUNTER — Ambulatory Visit (INDEPENDENT_AMBULATORY_CARE_PROVIDER_SITE_OTHER): Payer: Medicare Other | Admitting: Pharmacist

## 2021-01-05 ENCOUNTER — Telehealth: Payer: Self-pay | Admitting: Pharmacist

## 2021-01-05 DIAGNOSIS — E785 Hyperlipidemia, unspecified: Secondary | ICD-10-CM

## 2021-01-05 DIAGNOSIS — F341 Dysthymic disorder: Secondary | ICD-10-CM

## 2021-01-05 DIAGNOSIS — I10 Essential (primary) hypertension: Secondary | ICD-10-CM

## 2021-01-05 DIAGNOSIS — R739 Hyperglycemia, unspecified: Secondary | ICD-10-CM

## 2021-01-05 DIAGNOSIS — J45909 Unspecified asthma, uncomplicated: Secondary | ICD-10-CM | POA: Diagnosis not present

## 2021-01-05 NOTE — Chronic Care Management (AMB) (Signed)
Chronic Care Management Pharmacy Note  01/06/2021 Name:  Wendy Christensen MRN:  [id number] DOB:  1945-10-12  Subjective: Wendy Christensen is an 75 y.o. year old female who is a primary patient of [name], DO.  The CCM team was consulted for assistance with disease management and care coordination needs.    Engaged with patient by telephone for follow up visit in response to provider referral for pharmacy case management and/or care coordination services.   Consent to Services:  The patient was given information about Chronic Care Management services, agreed to services, and gave verbal consent prior to initiation of services.  Please see initial visit note for detailed documentation.   Patient Care Team: Carollee Herter, Alferd Apa, DO as PCP - General Inda Castle, MD (Inactive) as Consulting Physician (Gastroenterology) Rexene Agent, MD as Attending Physician (Nephrology) Mosetta Anis, MD as Referring Physician (Allergy) Alanda Slim Neena Rhymes, MD as Consulting Physician (Ophthalmology) Sanjuana Kava, MD as Referring Physician (Obstetrics and Gynecology) Pedro Earls, MD as Attending Physician (Family Medicine) Cherre Robins, PharmD (Pharmacist)  Recent office visits: 10/28/2020 - PCP (Dr [name]) Seen for f/u HTN and anxiety; BP was 142/80; No medication changes.   Recent consult visits: No recent consults in last 6 months  Hospital visits: None in previous 6 months  Objective:  Lab Results  Component Value Date   CREATININE 1.56 (H) 10/28/2020   CREATININE 1.56 (H) 05/11/2020   CREATININE 1.13 04/13/2020    Lab Results  Component Value Date   HGBA1C 6.4 09/14/2014       Component Value Date/Time   CHOL 197 10/28/2020 0931   TRIG 47.0 10/28/2020 0931   HDL 69.50 10/28/2020 0931   CHOLHDL 3 10/28/2020 0931   VLDL 9.4 10/28/2020 0931   LDLCALC 119 (H) 10/28/2020 0931   LDLCALC 97 05/11/2020 1434   LDLDIRECT 215.7 10/18/2011 0852     Hepatic Function Latest Ref Rng & Units 10/28/2020 05/11/2020 04/13/2020  Total Protein 6.0 - 8.3 g/dL 7.8 7.4 7.4  Albumin 3.5 - 5.2 g/dL 4.4 - 4.2  AST 0 - 37 U/L '14 17 15  ' ALT 0 - 35 U/L '10 11 11  ' Alk Phosphatase 39 - 117 U/L 50 - 61  Total Bilirubin 0.2 - 1.2 mg/dL 0.4 0.4 0.4  Bilirubin, Direct 0.0 - 0.3 mg/dL - - -    Lab Results  Component Value Date/Time   TSH 1.53 05/11/2020 02:34 PM   TSH 1.24 11/17/2016 04:04 PM   FREET4 0.75 06/17/2013 10:44 AM   FREET4 0.83 11/18/2010 09:30 AM    CBC Latest Ref Rng & Units 05/11/2020 07/22/2018 11/17/2016  WBC 3.8 - 10.8 Thousand/uL 7.0 6.1 6.4  Hemoglobin 11.7 - 15.5 g/dL 11.3(L) 11.2(L) 12.0  Hematocrit 35.0 - 45.0 % 35.0 34.7(L) 37.5  Platelets 140 - 400 Thousand/uL 315 271.0 294    Lab Results  Component Value Date/Time   VD25OH 47 05/11/2020 02:34 PM   VD25OH 32 03/24/2010 12:00 AM    Clinical ASCVD: No  The 10-year ASCVD risk score Mikey Bussing DC Jr., et al., 2013) is: 17.1%   Values used to calculate the score:     Age: 82 years     Sex: Female     Is Non-Hispanic African American: Yes     Diabetic: No     Tobacco smoker: No     Systolic Blood Pressure: 919 mmHg     Is BP treated: Yes  HDL Cholesterol: 69.5 mg/dL     Total Cholesterol: 197 mg/dL     Social History   Tobacco Use  Smoking Status Never Smoker  Smokeless Tobacco Never Used   BP Readings from Last 3 Encounters:  10/28/20 (!) 142/80  05/11/20 132/80  04/13/20 (!) 182/90   Pulse Readings from Last 3 Encounters:  10/28/20 65  05/11/20 (!) 59  04/13/20 60   Wt Readings from Last 3 Encounters:  10/28/20 177 lb 3.2 oz (80.4 kg)  05/11/20 179 lb 9.6 oz (81.5 kg)  04/13/20 181 lb (82.1 kg)    Assessment: Review of patient past medical history, allergies, medications, health status, including review of consultants reports, laboratory and other test data, was performed as part of comprehensive evaluation and provision of chronic care management  services.   SDOH:  (Social Determinants of Health) assessments and interventions performed:  SDOH Interventions   Flowsheet Row Most Recent Value  SDOH Interventions   Physical Activity Interventions Other (Comments)  [Exercise goal is to increase to 150 mintues per week of moderate physical activity]  Transportation Interventions Intervention Not Indicated      CCM Care Plan  No Known Allergies  Medications Reviewed Today    Reviewed by Cherre Robins, PharmD (Pharmacist) on 01/05/21 at [place] List Status: <None>  Medication Order Taking? Sig Documenting Provider Last Dose Status Informant  albuterol [facility] HFA) 108 (90 Base) MCG/ACT inhaler [id number] Yes INHALE 2 PUFFS INTO THE LUNGS EVERY 4 HOURS AS NEEDED FOR WHEEZING [name], DO Taking Active   ALPRAZolam Duanne Moron) 0.25 MG tablet [id number] Yes Take 1 tablet (0.25 mg total) by mouth 2 (two) times daily as needed for anxiety. Carollee Herter, Kendrick Fries R, DO Taking Active   amLODipine (NORVASC) 5 MG tablet [id number] Yes TAKE 1 TABLET(5 MG) BY MOUTH DAILY Carollee Herter, Alferd Apa, DO Taking Active   citalopram (CELEXA) 40 MG tablet [id number] Yes Take 1 tablet (40 mg total) by mouth every morning. [name], [place] Taking Active   fenofibrate 160 MG tablet [id number] Yes TAKE 1 TABLET BY MOUTH  DAILY Carollee Herter, Alferd Apa, DO Taking Active   fluticasone (FLONASE) 50 MCG/ACT nasal spray [id number] No Place 2 sprays into both nostrils as needed.  Patient not taking: Reported on 01/05/2021   [name], DO Not Taking Active   fluticasone furoate-vilanterol (BREO ELLIPTA) 200-25 MCG/INH AEPB [id number] No Inhale 1 puff into the lungs daily. Per Dr. Donneta Romberg  Patient not taking: Reported on 01/05/2021   [name], DO Not Taking Active   hydrochlorothiazide (HYDRODIURIL) 25 MG tablet [id number] Yes Take 1 tablet (25 mg total) by mouth daily. 1 po qd prn  Patient taking differently: Take 25 mg by mouth daily as  needed.   Carollee Herter, Kendrick Fries R, DO Taking Active   metoprolol tartrate (LOPRESSOR) 100 MG tablet [id number] Yes TAKE 1 TABLET BY MOUTH  TWICE DAILY Carollee Herter, Alferd Apa, DO Taking Active   montelukast (SINGULAIR) 10 MG tablet [id number] Yes Take 1 tablet (10 mg total) by mouth at bedtime. [name], DO Taking Active   NONFORMULARY OR COMPOUNDED ITEM [id number] Yes Please dispense a blood pressure cuff to patient if approved through insurance. [name], DO Taking Active   nystatin cream (MYCOSTATIN) [id number] Yes Apply 1 application topically 2 (two) times daily. Carollee Herter, Kendrick Fries R, DO Taking Active   olmesartan (BENICAR) 40 MG tablet [id number] Yes TAKE 1 TABLET BY  MOUTH  DAILY Carollee Herter, Kendrick Fries R, DO Taking Active   omeprazole (PRILOSEC) 20 MG capsule [id number] Yes TAKE 1 CAPSULE BY MOUTH  DAILY Carollee Herter, Alferd Apa, DO Taking Active   Propylene Glycol-Glycerin (MOISTURE EYES OP) [id number] Yes Apply to eye as needed. [provider] Taking Active   rosuvastatin (CRESTOR) 20 MG tablet [id number] Yes TAKE 1 TABLET BY MOUTH  DAILY Carollee Herter, Alferd Apa, DO Taking Active   vitamin B-12 (CYANOCOBALAMIN) 1000 MCG tablet [id number] Yes Take 1,000 mcg by mouth daily. [provider] Taking Active           Patient Active Problem List   Diagnosis Date Noted  . History of asthma 04/13/2020  . Preventative health care 04/13/2020  . Urinary frequency 10/05/2019  . Preop examination 07/22/2018  . Tendon tear, ankle, right, initial encounter 07/16/2018  . Candidiasis of vagina 04/23/2018  . Uterine prolapse 04/23/2018  . Mass of right axilla 04/18/2018  . Chest pain 05/25/2017  . History of colon polyps 05/25/2017  . Hypertension 07/13/2015  . Renal insufficiency 03/30/2015  . Pain in the abdomen 02/24/2015  . Intrinsic asthma 01/26/2015  . Hyperglycemia 06/19/2013  . Hypothyroidism 06/17/2013  . OSA (obstructive sleep apnea) 01/14/2013  . BACK PAIN,  THORACIC REGION, LEFT 11/18/2010  . ANXIETY DEPRESSION 05/20/2009  . SKIN TAG 04/19/2009  . POSTMENOPAUSAL STATUS 12/28/2008  . SINUSITIS- ACUTE-NOS 09/09/2008  . ANXIETY 09/01/2008  . GENERALIZED ANXIETY DISORDER 06/30/2008  . INSOMNIA 04/24/2008  . GOITER NOS 06/04/2007  . NUMBNESS, ARM 06/04/2007  . WEIGHT LOSS 06/04/2007  . Hyperlipidemia LDL goal <100 02/07/2007  . Essential hypertension 02/07/2007  . ALLERGIC RHINITIS 02/07/2007  . GERD 02/07/2007  . COLONOSCOPY, HX OF 02/07/2007  . DILATION AND CURETTAGE, HX OF 02/07/2007    Immunization History  Administered Date(s) Administered  . Influenza Split 05/29/2012, 06/17/2013  . Influenza Whole 07/31/2007  . Influenza, High Dose Seasonal PF 07/02/2015, 05/24/2017, 07/16/2018  . Influenza-Unspecified 08/07/2014, 06/19/2016  . PFIZER(Purple Top)SARS-COV-2 Vaccination 11/11/2019, 12/02/2019, 07/17/2020  . Pneumococcal Conjugate-13 07/07/2015  . Pneumococcal Polysaccharide-23 12/16/2012  . Td 04/18/2005  . Zoster 04/24/2008    Conditions to be addressed/monitored: HTN, HLD, CKD Stage 3b, Anxiety, Depression, Pulmonary Disease and allergic rhinitis; hyperglycemia / preDM  Care Plan : General Pharmacy (Adult)  Updates made by Cherre Robins, PHARMD since 01/06/2021 12:00 AM    Problem: Chronic Disease Management support, education, and care coordination needs related to Hypertension, Hyperlipidemia, Pre-Diabetes, Asthma, Depression/Anxiety, GERD, Allergic Rhinitis   Priority: High  Onset Date: 01/05/2021  Note:   Current Barriers:  . Unable to maintain control of HTN  or hyperlipidemia . Chronic Disease Management support, education, and care coordination needs related to Hypertension, Hyperlipidemia, Pre-Diabetes, Asthma, Depression/Anxiety, GERD, Allergic Rhinitis  Pharmacist Clinical Goal(s):  Marland Kitchen Over the next 90 days, patient will achieve adherence to monitoring guidelines and medication adherence to achieve therapeutic  efficacy . achieve control of HTN as evidenced by BP <140/90  through collaboration with PharmD and provider.  . Achieve control of hyperlipidemia as evidenced by LDL <100 through collaboration with PharmD and provider.  Interventions: . 1:1 collaboration with Carollee Herter, Alferd Apa, DO regarding development and update of comprehensive plan of care as evidenced by provider attestation and co-signature . Inter-disciplinary care team collaboration (see longitudinal plan of care) . Comprehensive medication review performed; medication list updated in electronic medical record  Pre - Diabetes: . Controlled . Current regimen:  o Diet and exercise management  .  Interventions: o Discussed importance of limiting carbs o Emphasized exercise, weight control and diet in prevention progression to diabetes  o Recommended increase physical activity to goal of 150 minutes per week.   Hypertension: BP Readings from Last 3 Encounters:  10/28/20 (!) 142/80  05/11/20 132/80  04/13/20 (!) 182/90   . Uncontrolled / variable - BP goal <140/90 . Patient states she has not checked BP in the last 2 weeks at home but recalls BP usually around 140/72 . Current regimen:   Metoprolol tartrate 144m twice daily,  Olmesartan 432mdaily  Amlodipine 58m11maily  Hydrochlorothiazide 258m67mily as needed for swelling (only takes about once per week) . Interventions: o Discussed importance of med adherence o Coordinated with PCP team for 90 day supply refill for amlodipine 58mg 28mmail order pharmacy o Recommended patient check blood pressure 1 to 2 times per week  o Maintain current hypertension medication regimen.   Hyperlipidemia (LDL goal <100): . UncMarland Kitchenntrolled - last LDL had increased to 119 (10/28/2020)  .       Patient denied missed medications . Current regimen:  o Rosuvastatin 20mg 48my o Fenofibrate 160mg d59m  . Interventions: o Emphasized the importance of med adherence and discussed goal of LDL  and triglycerides o Dicussed increasing physical activity to goal of 150 minutes per week.  o Maintain cholesterol medication regimen. If LDL still > 100 at next check, consider rosuvastatin 40mg da22mor add ezetimibe 10mg dai67m Asthma / Allergic rhinitis: . Controlled no exacerbations noted in last 12 months . Current regimen:  o Albuterol inhaler - inhaler 2 puffs every 6 hours as needed for shortness of breath o Breo 200/258mcg - i23mer 1 puff daily (using as needed per allergist) . Interventions: o Discussed maintenance versus rescue inhaler.  o Continue current regimen and follow up with allergist.  Depression/Anxiety/ Sleep Disturbance:  . Controlled .       Current treatment: o Citalopram 40mg daily658mAlprazolam 0.258mg up to 4me a day as needed (has not filled in last 3 months - does not take daily) . Recommended continue current regimen  GERD:  . Controlled - patient denies any breakthrough reflux symptoms . Current regimen:  o Omeprazole 20mg daily .558merventions: o Recommended continue current regimen  Health Maintenance  . Interventions: o Consider completing Shingrix vaccine  . Patient self care activities - Over the next 90 days, patient will: o Complete Shingrix Vaccine series - [facility] the [facility] time you are in the office.   Medication management . Current pharmacy: [facility] . Interventions o Comprehensive medication review performed. Updated med list o Reviewed refill history and discuss adherence with patient o Continue current medication management strategy  Patient Goals/Self-Care Activities . Over the next 90 days, patient will:  take medications as prescribed, check blood pressure 1 to 2 times per week, document, and provide at future appointments, and target a minimum of 150 minutes of moderate intensity exercise weekly  Follow Up Plan: Telephone follow up appointment with care management team  member scheduled for:  3 months     Please see past updates related to this goal by clicking on the "Past Updates" button in the selected goal       Medication Assistance: None required.  Patient affirms current coverage meets needs.  [id number] [place] [address] Phone: [phone] Fax: [phone]  [place], [address], [address] [place], [address] [place] [address] Phone: [phone] Fax: [phone]  Uses pill box? No - patient preference Pt endorses 100% compliance for most medications (does not use Breo daily but she states allergist instructed her to use only as needed; no intervention since patient also rarely uses albuterol / rescue inhaler)  Follow Up:  Patient agrees to Care Plan and Follow-up.  Plan: Telephone follow up appointment with care management team member scheduled for:  3 to 4 months  Cherre Robins, PharmD Clinical Pharmacist [facility]

## 2021-01-05 NOTE — Telephone Encounter (Signed)
Patient is requesting updated RX for amlodipine 5mg  qd for 90 day supply to be sent to mail order  Pharmacy - [facility].

## 2021-01-06 ENCOUNTER — Other Ambulatory Visit: Payer: Self-pay

## 2021-01-06 DIAGNOSIS — I10 Essential (primary) hypertension: Secondary | ICD-10-CM

## 2021-01-06 MED ORDER — AMLODIPINE BESYLATE 5 MG PO TABS: ORAL_TABLET | ORAL | 0 refills | Status: DC

## 2021-01-06 NOTE — Patient Instructions (Signed)
Visit Information Wendy Christensen,   It was a pleasure speaking with you today. Please feel free to contact me if you have any questions or concerns. Below is information regarding you health goals.   I also spoke with the pharmacy downstairs and they will be able to administer the Shingrix (Shingles vaccine) when you are in for your labs in May. Just present to the window. They will check coverage at that time (no obligation if insurance does not cover it but pharmacist was pretty sure it would be covered).   Wendy Christensen, PharmD Clinical Pharmacist [facility] [address] [place] [facility] [phone]  PATIENT GOALS: Goals Addressed            This Visit's Progress   . Chronic Care Management Pharmacy Care Plan   On track    CARE PLAN ENTRY (see longitudinal plan of care for additional care plan information)  Current Barriers:  . Chronic Disease Management support, education, and care coordination needs related to Hypertension, Hyperlipidemia, Pre-Diabetes, Asthma, Depression/Anxiety, GERD, Allergic Rhinitis   Hypertension BP Readings from Last 3 Encounters:  10/28/20 (!) 142/80  05/11/20 132/80  04/13/20 (!) 182/90   . Pharmacist Clinical Goal(s): o Over the next 90 days, patient will work with PharmD and providers to achieve BP goal <140/90 . Current regimen:   Metoprolol tartrate 100mg  twice daily,  Olmesartan 40mg  daily  Hctz 25mg  daily as needed  Amlodipine 5mg  daily . Interventions: o Discussed importance of med adherence o Coordinated with PCP team to get refill for amlodipine 5mg  #90 sent to mail order pharmacy o Check blood pressure 1 to 2 times per week  . Patient self care activities - Over the next 90 days, patient will: o Check blood pressure 1 or 2 times per week, document, and provide at future appointments o Ensure daily salt intake < 2300 mg/day o Maintain hypertension medication regimen.   Hyperlipidemia Lab Results  Component Value  Date/Time   LDLCALC 119 (H) 10/28/2020 09:31 AM   LDLCALC 97 05/11/2020 02:34 PM   LDLDIRECT 215.7 10/18/2011 08:52 AM   . Pharmacist Clinical Goal(s): o Over the next 90 days, patient will work with PharmD and providers to achieve LDL goal < 100 and Tg <150 . Current regimen:  o Rosuvastatin 20mg  daily o Fenofibrate 160mg  daily  . Interventions: o Emphasized the importance of med adherence and discussed goal of LDL and triglycerides o Complete lipid panel at next visit with Dr. [name] . Patient self care activities - Over the next 90 days, patient will: o Maintain cholesterol medication regimen.   Pre-Diabetes Lab Results  Component Value Date/Time   HGBA1C 6.4 09/14/2014 12:01 PM   HGBA1C 6.4 06/19/2013 08:53 AM   . Pharmacist Clinical Goal(s): o Over the next 90 days, patient will work with PharmD and providers to maintain A1c goal <6.5% . Current regimen:  o Diet and exercise management  . Interventions: o Discussed importance of limiting carbs o Emphasized exercise, weight control and diet in prevention progression to diabetes  . Patient self care activities - Over the next 90 days, patient will: o Maintain a1c <6.5% o Increase physical activity to goal of 150 minutes per week.   Asthma / Allergies:  . Pharmacist Clinical Goal(s): o Over the next 90 days, patient will work with PharmD and providers to maintain control of asthma  . Current regimen:  o Albuterol inhaler - inhaler 2 puffs every 6 hours as needed for shortness of breath o Breo  200/77mcg - inhaler 1 puff daily (using as needed per allergist) . Interventions: o Discussed maintenance versus rescue inhaler.  . Patient self care activities - Over the next 90 days, patient will: o Continue current regimen and follow up with allergist.   Health Maintenance  . Pharmacist Clinical Goal(s) o Over the next 90 days, patient will work with PharmD and providers to complete health maintenance  screenings/vaccinations . Interventions: o Consider completing Shingrix vaccine series . Patient self care activities - Over the next 90 days, patient will: o Complete Shingrix Vaccine series - you can get this at the [facility] (downstairs) the next time you are in the office.   Medication management . Pharmacist Clinical Goal(s): o Over the next 90 days, patient will work with PharmD and providers to achieve optimal medication adherence . Current pharmacy: [facility] . Interventions o Comprehensive medication review performed. o Continue current medication management strategy . Patient self care activities - Over the next 90 days, patient will: o Focus on medication adherence by filling medications appropriately  o Take medications as prescribed o Report any questions or concerns to PharmD and/or provider(s)  Please see past updates related to this goal by clicking on the "Past Updates" button in the selected goal         The patient verbalized understanding of instructions, educational materials, and care plan provided today and agreed to receive a mailed copy of patient instructions, educational materials, and care plan.   Telephone follow up appointment with care management team member scheduled for: 3 to 4 months

## 2021-01-06 NOTE — Telephone Encounter (Signed)
Medication was filled

## 2021-01-12 DIAGNOSIS — H33003 Unspecified retinal detachment with retinal break, bilateral: Secondary | ICD-10-CM | POA: Diagnosis not present

## 2021-01-26 ENCOUNTER — Other Ambulatory Visit: Payer: Medicare Other

## 2021-01-28 ENCOUNTER — Other Ambulatory Visit: Payer: Self-pay

## 2021-01-28 ENCOUNTER — Other Ambulatory Visit (INDEPENDENT_AMBULATORY_CARE_PROVIDER_SITE_OTHER): Payer: Medicare Other

## 2021-01-28 DIAGNOSIS — E785 Hyperlipidemia, unspecified: Secondary | ICD-10-CM

## 2021-01-28 DIAGNOSIS — N1832 Chronic kidney disease, stage 3b: Secondary | ICD-10-CM

## 2021-01-28 LAB — COMPREHENSIVE METABOLIC PANEL
ALT: 15 U/L (ref 0–35)
AST: 19 U/L (ref 0–37)
Albumin: 4.2 g/dL (ref 3.5–5.2)
Alkaline Phosphatase: 43 U/L (ref 39–117)
BUN: 19 mg/dL (ref 6–23)
CO2: 31 mEq/L (ref 19–32)
Calcium: 9.4 mg/dL (ref 8.4–10.5)
Chloride: 103 mEq/L (ref 96–112)
Creatinine, Ser: 1.2 mg/dL (ref 0.40–1.20)
GFR: 45 mL/min — ABNORMAL LOW (ref >60.00)
Glucose, Bld: 90 mg/dL (ref 70–99)
Potassium: 4.1 mEq/L (ref 3.5–5.1)
Sodium: 140 mEq/L (ref 135–145)
Total Bilirubin: 0.4 mg/dL (ref 0.2–1.2)
Total Protein: 7.3 g/dL (ref 6.0–8.3)

## 2021-01-28 LAB — LIPID PANEL
Cholesterol: 191 mg/dL (ref 0–200)
HDL: 65.2 mg/dL (ref >39.00)
LDL Cholesterol: 115 mg/dL — ABNORMAL HIGH (ref 0–99)
NonHDL: 126.18
Total CHOL/HDL Ratio: 3
Triglycerides: 58 mg/dL (ref 0.0–149.0)
VLDL: 11.6 mg/dL (ref 0.0–40.0)

## 2021-02-04 ENCOUNTER — Other Ambulatory Visit: Payer: Self-pay

## 2021-02-04 ENCOUNTER — Ambulatory Visit: Payer: Medicare Other

## 2021-02-04 VITALS — Ht 65.0 in | Wt 181.0 lb

## 2021-02-04 DIAGNOSIS — Z86010 Personal history of colonic polyps: Secondary | ICD-10-CM

## 2021-02-04 MED ORDER — NA SULFATE-K SULFATE-MG SULF 17.5-3.13-1.6 GM/177ML PO SOLN: 1.0000 | Freq: Once | ORAL | 0 refills | Status: AC

## 2021-02-04 NOTE — Progress Notes (Signed)
No allergies to soy or egg Pt is not on blood thinners or diet pills Denies issues with sedation/intubation Denies atrial flutter/fib Denies constipation   Emmi instructions given to pt  Pt is aware of Covid safety and care partner requirements.  

## 2021-02-08 ENCOUNTER — Encounter: Payer: Self-pay | Admitting: Gastroenterology

## 2021-02-17 ENCOUNTER — Telehealth: Payer: Self-pay | Admitting: Pharmacist

## 2021-02-17 NOTE — Chronic Care Management (AMB) (Signed)
Chronic Care Management Pharmacy Assistant   Name: Wendy Christensen  MRN: [id number] DOB: 1946/06/10   Reason for Encounter: Disease State Hypertension    Recent office visits:  None noted  Recent consult visits:  None noted  Hospital visits:  None in previous 6 months  Medications: Outpatient Encounter Medications as of 02/17/2021  Medication Sig   albuterol (PROAIR HFA) 108 (90 Base) MCG/ACT inhaler INHALE 2 PUFFS INTO THE LUNGS EVERY 4 HOURS AS NEEDED FOR WHEEZING   ALPRAZolam (XANAX) 0.25 MG tablet Take 1 tablet (0.25 mg total) by mouth 2 (two) times daily as needed for anxiety. (Patient not taking: Reported on 02/04/2021)   amLODipine (NORVASC) 5 MG tablet TAKE 1 TABLET(5 MG) BY MOUTH DAILY   citalopram (CELEXA) 40 MG tablet Take 1 tablet (40 mg total) by mouth every morning.   fenofibrate 160 MG tablet TAKE 1 TABLET BY MOUTH  DAILY   fluticasone (FLONASE) 50 MCG/ACT nasal spray Place 2 sprays into both nostrils as needed.   fluticasone furoate-vilanterol (BREO ELLIPTA) 200-25 MCG/INH AEPB Inhale 1 puff into the lungs daily. Per Dr. Donneta Romberg   hydrochlorothiazide (HYDRODIURIL) 25 MG tablet Take 1 tablet (25 mg total) by mouth daily. 1 po qd prn (Patient taking differently: Take 25 mg by mouth daily as needed.)   metoprolol tartrate (LOPRESSOR) 100 MG tablet TAKE 1 TABLET BY MOUTH  TWICE DAILY   montelukast (SINGULAIR) 10 MG tablet Take 1 tablet (10 mg total) by mouth at bedtime.   NONFORMULARY OR COMPOUNDED ITEM Please dispense a blood pressure cuff to patient if approved through insurance. (Patient not taking: Reported on 02/04/2021)   nystatin cream (MYCOSTATIN) Apply 1 application topically 2 (two) times daily.   olmesartan (BENICAR) 40 MG tablet TAKE 1 TABLET BY MOUTH  DAILY   omeprazole (PRILOSEC) 20 MG capsule TAKE 1 CAPSULE BY MOUTH  DAILY   Propylene Glycol-Glycerin (MOISTURE EYES OP) Apply to eye as needed.   rosuvastatin (CRESTOR) 20 MG tablet TAKE 1 TABLET BY  MOUTH  DAILY   vitamin B-12 (CYANOCOBALAMIN) 1000 MCG tablet Take 1,000 mcg by mouth daily.   No facility-administered encounter medications on file as of 02/17/2021.   Reviewed chart prior to disease state call. Spoke with patient regarding BP  Recent Office Vitals: BP Readings from Last 3 Encounters:  10/28/20 (!) 142/80  05/11/20 132/80  04/13/20 (!) 182/90   Pulse Readings from Last 3 Encounters:  10/28/20 65  05/11/20 (!) 59  04/13/20 60    Wt Readings from Last 3 Encounters:  02/04/21 181 lb (82.1 kg)  10/28/20 177 lb 3.2 oz (80.4 kg)  05/11/20 179 lb 9.6 oz (81.5 kg)     Kidney Function Lab Results  Component Value Date/Time   CREATININE 1.20 01/28/2021 10:05 AM   CREATININE 1.56 (H) 10/28/2020 09:31 AM   CREATININE 1.56 (H) 05/11/2020 02:34 PM   CREATININE 1.33 (H) 11/17/2016 04:02 PM   GFR 45.00 (L) 01/28/2021 10:05 AM   GFRNONAA 50 (L) 02/24/2015 01:45 PM   GFRAA 58 (L) 02/24/2015 01:45 PM    BMP Latest Ref Rng & Units 01/28/2021 10/28/2020 05/11/2020  Glucose 70 - 99 mg/dL 90 73 89  BUN 6 - 23 mg/dL 19 23 24   Creatinine 0.40 - 1.20 mg/dL 1.20 1.56(H) 1.56(H)  BUN/Creat Ratio 6 - 22 (calc) - - 15  Sodium 135 - 145 mEq/L 140 139 138  Potassium 3.5 - 5.1 mEq/L 4.1 4.3 4.7  Chloride 96 - 112 mEq/L 103 101  103  CO2 19 - 32 mEq/L 31 31 26   Calcium 8.4 - 10.5 mg/dL 9.4 9.7 9.4    Current antihypertensive regimen:  Amlodipine 5 mg take 1 tablet daily Hydrochlorothiazide 25 mg take 1 tablet daily Metoprolol tartrate 100 mg take 1 tablet twice daily olmesartan 40 mg take 1 tablet daily  How often are you checking your Blood Pressure?  Current home BP readings:   What recent interventions/DTPs have been made by any provider to improve Blood Pressure control since last CPP Visit: None noted  Any recent hospitalizations or ED visits since last visit with CPP? No   What diet changes have been made to improve Blood Pressure Control?    What exercise is being  done to improve your Blood Pressure Control?    Adherence Review: Is the patient currently on ACE/ARB medication? Yes Does the patient have >5 day gap between last estimated fill dates? No    Star Rating Drugs: olmesartan 40 mg last filled 12/21/20 90 DS Rosuvastatin 20 mg last filled 12/21/20 90 DS   Left voicemail x 3, unable to reach patient  [facility] Pharmacist Assistant [phone]

## 2021-02-18 ENCOUNTER — Encounter: Payer: Self-pay | Admitting: Gastroenterology

## 2021-02-18 ENCOUNTER — Other Ambulatory Visit: Payer: Self-pay

## 2021-02-18 ENCOUNTER — Ambulatory Visit (AMBULATORY_SURGERY_CENTER): Payer: Medicare Other | Admitting: Gastroenterology

## 2021-02-18 VITALS — BP 137/77 | HR 63 | Temp 97.3°F | Resp 17 | Ht 65.0 in | Wt 181.0 lb

## 2021-02-18 DIAGNOSIS — Z86010 Personal history of colonic polyps: Secondary | ICD-10-CM | POA: Diagnosis not present

## 2021-02-18 DIAGNOSIS — D123 Benign neoplasm of transverse colon: Secondary | ICD-10-CM | POA: Diagnosis not present

## 2021-02-18 MED ORDER — SODIUM CHLORIDE 0.9 % IV SOLN: 500.0000 mL | Freq: Once | INTRAVENOUS | Status: DC

## 2021-02-18 NOTE — Patient Instructions (Signed)
Handouts on polyps,diverticulosis & hemorrhoids given to you today   Await pathology results on polyp removed    YOU HAD AN ENDOSCOPIC PROCEDURE TODAY AT [facility]:   Refer to the procedure report that was given to you for any specific questions about what was found during the examination.  If the procedure report does not answer your questions, please call your gastroenterologist to clarify.  If you requested that your care partner not be given the details of your procedure findings, then the procedure report has been included in a sealed envelope for you to review at your convenience later.  YOU SHOULD EXPECT: Some feelings of bloating in the abdomen. Passage of more gas than usual.  Walking can help get rid of the air that was put into your GI tract during the procedure and reduce the bloating. If you had a lower endoscopy (such as a colonoscopy or flexible sigmoidoscopy) you may notice spotting of blood in your stool or on the toilet paper. If you underwent a bowel prep for your procedure, you may not have a normal bowel movement for a few days.  Please Note:  You might notice some irritation and congestion in your nose or some drainage.  This is from the oxygen used during your procedure.  There is no need for concern and it should clear up in a day or so.  SYMPTOMS TO REPORT IMMEDIATELY:   Following lower endoscopy (colonoscopy or flexible sigmoidoscopy):  Excessive amounts of blood in the stool  Significant tenderness or worsening of abdominal pains  Swelling of the abdomen that is new, acute  Fever of 100F or higher    For urgent or emergent issues, a gastroenterologist can be reached at any hour by calling [phone]. Do not use MyChart messaging for urgent concerns.    DIET:  We do recommend a small meal at first, but then you may proceed to your regular diet.  Drink plenty of fluids but you should avoid alcoholic beverages for 24 hours.  ACTIVITY:   You should plan to take it easy for the rest of today and you should NOT DRIVE or use heavy machinery until tomorrow (because of the sedation medicines used during the test).    FOLLOW UP: Our staff will call the number listed on your records 48-72 hours following your procedure to check on you and address any questions or concerns that you may have regarding the information given to you following your procedure. If we do not reach you, we will leave a message.  We will attempt to reach you two times.  During this call, we will ask if you have developed any symptoms of COVID 19. If you develop any symptoms (ie: fever, flu-like symptoms, shortness of breath, cough etc.) before then, please call [phone].  If you test positive for Covid 19 in the 2 weeks post procedure, please call and report this information to [place].    If any biopsies were taken you will be contacted by phone or by letter within the next 1-3 weeks.  Please call us at [phone] if you have not heard about the biopsies in 3 weeks.    SIGNATURES/CONFIDENTIALITY: You and/or your care partner have signed paperwork which will be entered into your electronic medical record.  These signatures attest to the fact that that the information above on your After Visit Summary has been reviewed and is understood.  Full responsibility of the confidentiality of this discharge information lies with  you and/or your care-partner.

## 2021-02-18 NOTE — Progress Notes (Signed)
Pt's states no medical or surgical changes since previsit or office visit.  Check-in-tanaeia  Vital signs-cw

## 2021-02-18 NOTE — Progress Notes (Signed)
pt tolerated well. VSS. awake and to recovery. Report given to RN.  

## 2021-02-18 NOTE — Progress Notes (Signed)
Called to room to assist during endoscopic procedure.  Patient ID and intended procedure confirmed with present staff. Received instructions for my participation in the procedure from the performing physician.  

## 2021-02-18 NOTE — Op Note (Signed)
[facility] Patient Name: Wendy Christensen Procedure Date: 02/18/2021 2:01 PM MRN: [id number] Endoscopist: Mauri Pole , MD Age: 75 Referring MD:  Date of Birth: 11-25-1945 Gender: Female Account #: [id number] Procedure:                Colonoscopy Indications:              High risk colon cancer surveillance: Personal                            history of colonic polyps Medicines:                Monitored Anesthesia Care Procedure:                Pre-Anesthesia Assessment:                           - Prior to the procedure, a History and Physical                            was performed, and patient medications and                            allergies were reviewed. The patient's tolerance of                            previous anesthesia was also reviewed. The risks                            and benefits of the procedure and the sedation                            options and risks were discussed with the patient.                            All questions were answered, and informed consent                            was obtained. Prior Anticoagulants: The patient has                            taken no previous anticoagulant or antiplatelet                            agents. ASA Grade Assessment: II - A patient with                            mild systemic disease. After reviewing the risks                            and benefits, the patient was deemed in                            satisfactory condition to undergo the procedure.  After obtaining informed consent, the colonoscope                            was passed under direct vision. Throughout the                            procedure, the patient's blood pressure, pulse, and                            oxygen saturations were monitored continuously. The                            Olympus PCF-H190DL [id number]) Colonoscope was                            introduced through the anus  and advanced to the the                            cecum, identified by appendiceal orifice and                            ileocecal valve. The colonoscopy was performed                            without difficulty. The patient tolerated the                            procedure well. The quality of the bowel                            preparation was adequate. The ileocecal valve,                            appendiceal orifice, and rectum were photographed. Scope In: 2:15:31 PM Scope Out: 2:33:45 PM Scope Withdrawal Time: 0 hours 13 minutes 25 seconds  Total Procedure Duration: 0 hours 18 minutes 14 seconds  Findings:                 The perianal and digital rectal examinations were                            normal.                           A 12 mm polyp was found in the transverse colon.                            The polyp was sessile. The polyp was removed with a                            cold snare. Resection and retrieval were complete.                           A few small-mouthed diverticula were found in the  sigmoid colon, descending colon, transverse colon                            and ascending colon.                           Non-bleeding internal hemorrhoids were found during                            retroflexion. The hemorrhoids were medium-sized. Complications:            No immediate complications. Estimated Blood Loss:     Estimated blood loss was minimal. Impression:               - One 12 mm polyp in the transverse colon, removed                            with a cold snare. Resected and retrieved.                           - Diverticulosis in the sigmoid colon, in the                            descending colon, in the transverse colon and in                            the ascending colon.                           - Non-bleeding internal hemorrhoids. Recommendation:           - Patient has a contact number available for                             emergencies. The signs and symptoms of potential                            delayed complications were discussed with the                            patient. Return to normal activities tomorrow.                            Written discharge instructions were provided to the                            patient.                           - Resume previous diet.                           - Continue present medications.                           - Await pathology results.                           -  Repeat colonoscopy in 3 - 5 years for                            surveillance based on pathology results. Mauri Pole, MD 02/18/2021 2:43:31 PM This report has been signed electronically.

## 2021-02-21 ENCOUNTER — Other Ambulatory Visit: Payer: Self-pay | Admitting: Family Medicine

## 2021-02-21 DIAGNOSIS — I10 Essential (primary) hypertension: Secondary | ICD-10-CM

## 2021-02-22 ENCOUNTER — Telehealth: Payer: Self-pay

## 2021-02-22 NOTE — Telephone Encounter (Signed)
  Follow up Call-  Call back number 02/18/2021  Post procedure Call Back phone  # [phone]  Permission to leave phone message Yes  Some recent data might be hidden     Patient questions:  Do you have a fever, pain , or abdominal swelling? No. Pain Score  0 *  Have you tolerated food without any problems? Yes.    Have you been able to return to your normal activities? Yes.    Do you have any questions about your discharge instructions: Diet   No. Medications  No. Follow up visit  No.  Do you have questions or concerns about your Care? No.  Actions: * If pain score is 4 or above: No action needed, pain <4.   1. Have you developed a fever since your procedure? No   2.   Have you had an respiratory symptoms (SOB or cough) since your procedure? No   3.   Have you tested positive for COVID 19 since your procedure no   4.   Have you had any family members/close contacts diagnosed with the COVID 19 since your procedure?  No    If yes to any of these questions please route to Joylene John, RN and Joella Prince, RN

## 2021-02-24 ENCOUNTER — Encounter: Payer: Self-pay | Admitting: Gastroenterology

## 2021-03-08 ENCOUNTER — Other Ambulatory Visit: Payer: Self-pay | Admitting: Family Medicine

## 2021-03-08 DIAGNOSIS — T7840XA Allergy, unspecified, initial encounter: Secondary | ICD-10-CM

## 2021-03-09 ENCOUNTER — Other Ambulatory Visit: Payer: Self-pay | Admitting: Family Medicine

## 2021-03-09 DIAGNOSIS — I10 Essential (primary) hypertension: Secondary | ICD-10-CM

## 2021-03-10 DIAGNOSIS — G4733 Obstructive sleep apnea (adult) (pediatric): Secondary | ICD-10-CM | POA: Diagnosis not present

## 2021-03-11 ENCOUNTER — Other Ambulatory Visit: Payer: Self-pay | Admitting: Family Medicine

## 2021-03-11 DIAGNOSIS — I10 Essential (primary) hypertension: Secondary | ICD-10-CM

## 2021-03-14 ENCOUNTER — Other Ambulatory Visit (HOSPITAL_BASED_OUTPATIENT_CLINIC_OR_DEPARTMENT_OTHER): Payer: Self-pay

## 2021-03-14 ENCOUNTER — Ambulatory Visit: Payer: Medicare Other | Attending: Internal Medicine

## 2021-03-14 DIAGNOSIS — Z23 Encounter for immunization: Secondary | ICD-10-CM

## 2021-03-14 MED ORDER — COVID-19 MRNA VAC-TRIS(PFIZER) 30 MCG/0.3ML IM SUSP
INTRAMUSCULAR | 0 refills | Status: DC
  Filled 2021-03-14: qty 0.3, 1d supply, fill #0

## 2021-03-14 NOTE — Progress Notes (Signed)
   Covid-19 Vaccination Clinic  Name:  Wendy Christensen    MRN: [id number] DOB: 09-Nov-1945  03/14/2021  [name] was observed post Covid-19 immunization for 15 minutes without incident. She was provided with Vaccine Information Sheet and instruction to access the V-Safe system.   [name] was instructed to call 911 with any severe reactions post vaccine: Difficulty breathing  Swelling of face and throat  A fast heartbeat  A bad rash all over body  Dizziness and weakness   Immunizations Administered     Name Date Dose VIS Date Route   PFIZER Comrnaty(Gray TOP) Covid-19 Vaccine 03/14/2021 12:35 PM 0.3 mL 08/26/2020 Intramuscular   Manufacturer: [employer]   Lot: [id number]   [place]: [phone]

## 2021-03-30 ENCOUNTER — Telehealth: Payer: Medicare Other

## 2021-04-15 ENCOUNTER — Telehealth: Payer: Self-pay | Admitting: Family Medicine

## 2021-04-15 NOTE — Telephone Encounter (Signed)
Pt lost her mother and is requesting her rx xanax sent  to [facility] on [address]. Informed pt that office visit was needed. Pt states she only needs it to get through the funeral. Pt would like a call back asap.

## 2021-04-18 ENCOUNTER — Other Ambulatory Visit: Payer: Self-pay | Admitting: Family Medicine

## 2021-04-18 DIAGNOSIS — F411 Generalized anxiety disorder: Secondary | ICD-10-CM

## 2021-04-18 MED ORDER — ALPRAZOLAM 0.25 MG PO TABS
0.2500 mg | ORAL_TABLET | Freq: Two times a day (BID) | ORAL | 0 refills | Status: DC | PRN
Start: 2021-04-18 — End: 2021-05-03

## 2021-04-18 NOTE — Telephone Encounter (Signed)
Pt made aware

## 2021-04-19 ENCOUNTER — Other Ambulatory Visit: Payer: Self-pay

## 2021-04-19 ENCOUNTER — Ambulatory Visit
Admission: EM | Admit: 2021-04-19 | Discharge: 2021-04-20 | Disposition: A | Discharge status: Home / Self Care | Payer: Medicare Other

## 2021-04-20 NOTE — ED Notes (Signed)
Unsure why patient shows up on board, discharging to remove prior to opening

## 2021-05-03 ENCOUNTER — Other Ambulatory Visit: Payer: Self-pay

## 2021-05-03 ENCOUNTER — Ambulatory Visit (INDEPENDENT_AMBULATORY_CARE_PROVIDER_SITE_OTHER): Payer: Medicare Other | Admitting: Family Medicine

## 2021-05-03 ENCOUNTER — Encounter: Payer: Self-pay | Admitting: Family Medicine

## 2021-05-03 VITALS — BP 130/80 | HR 52 | Temp 98.3°F | Resp 18 | Ht 65.0 in | Wt 183.8 lb

## 2021-05-03 DIAGNOSIS — Z0000 Encounter for general adult medical examination without abnormal findings: Secondary | ICD-10-CM | POA: Diagnosis not present

## 2021-05-03 DIAGNOSIS — Z8709 Personal history of other diseases of the respiratory system: Secondary | ICD-10-CM | POA: Diagnosis not present

## 2021-05-03 DIAGNOSIS — E785 Hyperlipidemia, unspecified: Secondary | ICD-10-CM

## 2021-05-03 DIAGNOSIS — F411 Generalized anxiety disorder: Secondary | ICD-10-CM

## 2021-05-03 DIAGNOSIS — I10 Essential (primary) hypertension: Secondary | ICD-10-CM

## 2021-05-03 DIAGNOSIS — T7840XA Allergy, unspecified, initial encounter: Secondary | ICD-10-CM | POA: Diagnosis not present

## 2021-05-03 DIAGNOSIS — K219 Gastro-esophageal reflux disease without esophagitis: Secondary | ICD-10-CM

## 2021-05-03 DIAGNOSIS — E039 Hypothyroidism, unspecified: Secondary | ICD-10-CM | POA: Diagnosis not present

## 2021-05-03 DIAGNOSIS — F341 Dysthymic disorder: Secondary | ICD-10-CM

## 2021-05-03 DIAGNOSIS — E2839 Other primary ovarian failure: Secondary | ICD-10-CM

## 2021-05-03 LAB — LIPID PANEL
Cholesterol: 254 mg/dL — ABNORMAL HIGH (ref 0–200)
HDL: 55.8 mg/dL (ref >39.00)
LDL Cholesterol: 172 mg/dL — ABNORMAL HIGH (ref 0–99)
NonHDL: 197.92
Total CHOL/HDL Ratio: 5
Triglycerides: 131 mg/dL (ref 0.0–149.0)
VLDL: 26.2 mg/dL (ref 0.0–40.0)

## 2021-05-03 LAB — COMPREHENSIVE METABOLIC PANEL
ALT: 11 U/L (ref 0–35)
AST: 17 U/L (ref 0–37)
Albumin: 3.9 g/dL (ref 3.5–5.2)
Alkaline Phosphatase: 54 U/L (ref 39–117)
BUN: 19 mg/dL (ref 6–23)
CO2: 31 mEq/L (ref 19–32)
Calcium: 9.4 mg/dL (ref 8.4–10.5)
Chloride: 102 mEq/L (ref 96–112)
Creatinine, Ser: 1.23 mg/dL — ABNORMAL HIGH (ref 0.40–1.20)
GFR: 43.61 mL/min — ABNORMAL LOW (ref >60.00)
Glucose, Bld: 82 mg/dL (ref 70–99)
Potassium: 4.5 mEq/L (ref 3.5–5.1)
Sodium: 138 mEq/L (ref 135–145)
Total Bilirubin: 0.4 mg/dL (ref 0.2–1.2)
Total Protein: 7.3 g/dL (ref 6.0–8.3)

## 2021-05-03 LAB — CBC WITH DIFFERENTIAL/PLATELET
Basophils Absolute: 0 10*3/uL (ref 0.0–0.1)
Basophils Relative: 0.4 % (ref 0.0–3.0)
Eosinophils Absolute: 0.4 10*3/uL (ref 0.0–0.7)
Eosinophils Relative: 7.2 % — ABNORMAL HIGH (ref 0.0–5.0)
HCT: 33.9 % — ABNORMAL LOW (ref 36.0–46.0)
Hemoglobin: 11 g/dL — ABNORMAL LOW (ref 12.0–15.0)
Lymphocytes Relative: 22.8 % (ref 12.0–46.0)
Lymphs Abs: 1.3 10*3/uL (ref 0.7–4.0)
MCHC: 32.4 g/dL (ref 30.0–36.0)
MCV: 83.9 fl (ref 78.0–100.0)
Monocytes Absolute: 0.5 10*3/uL (ref 0.1–1.0)
Monocytes Relative: 9 % (ref 3.0–12.0)
Neutro Abs: 3.5 10*3/uL (ref 1.4–7.7)
Neutrophils Relative %: 60.6 % (ref 43.0–77.0)
Platelets: 268 10*3/uL (ref 150.0–400.0)
RBC: 4.04 Mil/uL (ref 3.87–5.11)
RDW: 14.4 % (ref 11.5–15.5)
WBC: 5.8 10*3/uL (ref 4.0–10.5)

## 2021-05-03 LAB — TSH: TSH: 1.01 u[IU]/mL (ref 0.35–5.50)

## 2021-05-03 MED ORDER — OMEPRAZOLE 20 MG PO CPDR: 20.0000 mg | DELAYED_RELEASE_CAPSULE | Freq: Every day | ORAL | 3 refills | Status: DC

## 2021-05-03 MED ORDER — MONTELUKAST SODIUM 10 MG PO TABS: 10.0000 mg | ORAL_TABLET | Freq: Every day | ORAL | 1 refills | Status: DC

## 2021-05-03 MED ORDER — FENOFIBRATE 160 MG PO TABS: 160.0000 mg | ORAL_TABLET | Freq: Every day | ORAL | 3 refills | Status: DC

## 2021-05-03 MED ORDER — METOPROLOL TARTRATE 100 MG PO TABS: 100.0000 mg | ORAL_TABLET | Freq: Two times a day (BID) | ORAL | 3 refills | Status: DC

## 2021-05-03 MED ORDER — FLUTICASONE FUROATE-VILANTEROL 200-25 MCG/INH IN AEPB: 1.0000 | INHALATION_SPRAY | Freq: Every day | RESPIRATORY_TRACT | 3 refills | Status: DC

## 2021-05-03 MED ORDER — LORATADINE 10 MG PO TABS
10.0000 mg | ORAL_TABLET | Freq: Every day | ORAL | 3 refills | Status: DC
Start: 2021-05-03 — End: 2022-01-31

## 2021-05-03 MED ORDER — ALPRAZOLAM 0.25 MG PO TABS
0.2500 mg | ORAL_TABLET | Freq: Two times a day (BID) | ORAL | 0 refills | Status: DC | PRN
Start: 2021-05-03 — End: 2022-05-04

## 2021-05-03 MED ORDER — OLMESARTAN MEDOXOMIL 40 MG PO TABS: 40.0000 mg | ORAL_TABLET | Freq: Every day | ORAL | 3 refills | Status: DC

## 2021-05-03 MED ORDER — HYDROCHLOROTHIAZIDE 25 MG PO TABS: 25.0000 mg | ORAL_TABLET | Freq: Every day | ORAL | 1 refills | Status: DC | PRN

## 2021-05-03 MED ORDER — AMLODIPINE BESYLATE 5 MG PO TABS
ORAL_TABLET | ORAL | 1 refills | Status: DC
Start: 2021-05-03 — End: 2021-05-17

## 2021-05-03 MED ORDER — FLUTICASONE PROPIONATE 50 MCG/ACT NA SUSP: 2.0000 | NASAL | 3 refills | Status: DC | PRN

## 2021-05-03 MED ORDER — ROSUVASTATIN CALCIUM 20 MG PO TABS: 20.0000 mg | ORAL_TABLET | Freq: Every day | ORAL | 3 refills | Status: DC

## 2021-05-03 MED ORDER — ALBUTEROL SULFATE HFA 108 (90 BASE) MCG/ACT IN AERS: INHALATION_SPRAY | RESPIRATORY_TRACT | 3 refills | Status: DC

## 2021-05-03 NOTE — Assessment & Plan Note (Signed)
Check tsh con't meds 

## 2021-05-03 NOTE — Assessment & Plan Note (Signed)
Stable con't meds 

## 2021-05-03 NOTE — Assessment & Plan Note (Signed)
ghm utd Check labs  See avs  

## 2021-05-03 NOTE — Progress Notes (Signed)
Subjective:     Wendy Christensen is a 75 y.o. female and is here for a comprehensive physical exam. The patient reports no problems.    Pt also needs refills and f/u bp , chol  Social History   Socioeconomic History   Marital status: Married    Spouse name: Not on file   Number of children: Not on file   Years of education: Not on file   Highest education level: Not on file  Occupational History   Occupation: Retired    Fish farm manager: DISABLE  Tobacco Use   Smoking status: Never   Smokeless tobacco: Never  Vaping Use   Vaping Use: Never used  Substance and Sexual Activity   Alcohol use: Not Currently    Alcohol/week: 0.0 standard drinks   Drug use: No   Sexual activity: Yes    Partners: Male    Comment: Husband   Other Topics Concern   Not on file  Social History Narrative   Exercise--babysitting---   Social Determinants of Health   Financial Resource Strain: Low Risk    Difficulty of Paying Living Expenses: Not very hard  Food Insecurity: Not on file  Transportation Needs: No Transportation Needs   Lack of Transportation (Medical): No   Lack of Transportation (Non-Medical): No  Physical Activity: Insufficiently Active   Days of Exercise per Week: 4 days   Minutes of Exercise per Session: 10 min  Stress: Not on file  Social Connections: Not on file  Intimate Partner Violence: Not on file   Health Maintenance  Topic Date Due   Zoster Vaccines- Shingrix (1 of 2) Never done   DEXA SCAN  01/22/2011   TETANUS/TDAP  04/19/2015   INFLUENZA VACCINE  04/18/2021   COVID-19 Vaccine (5 - Booster for Roslyn series) 07/14/2021   MAMMOGRAM  08/30/2021   COLONOSCOPY (Pts 45-67yr Insurance coverage will need to be confirmed)  02/19/2024   Hepatitis C Screening  Completed   PNA vac Low Risk Adult  Completed   HPV VACCINES  Aged Out    The following portions of the patient's history were reviewed and updated as appropriate: She  has a past medical history of Allergic  rhinitis, Allergy, Anxiety, Asthma, intrinsic, Cataract, Depression, Frequency of urination, GERD (gastroesophageal reflux disease), Hyperlipidemia, Hypertension, Kidney stone, OSA on CPAP, Sleep apnea, Thyroid goiter, Urgency of urination, and Wears glasses. She does not have any pertinent problems on file. She  has a past surgical history that includes Tubal ligation (1980's); Dilation and curettage of uterus (1990); Cystoscopy with retrograde pyelogram, ureteroscopy and stent placement (Left, 06/21/2015); Holmium laser application (Left, 10000000; Stone extraction with basket (Left, 06/21/2015); Eye surgery; Cataract extraction, bilateral; Breast biopsy; and Colonoscopy (2013). Her family history includes Asthma in her father; Breast cancer in her cousin and cousin; COPD in her father; Heart attack in her maternal aunt and mother; Heart disease in her mother; Hyperlipidemia in an other family member; Hypertension in her brother, brother, mother, and another family member; Kidney cancer in her maternal aunt; Stroke in her brother and mother. She  reports that she has never smoked. She has never used smokeless tobacco. She reports that she does not currently use alcohol. She reports that she does not use drugs. She has a current medication list which includes the following prescription(s): citalopram, loratadine, NONFORMULARY OR COMPOUNDED ITEM, nystatin cream, propylene glycol-glycerin, vitamin b-12, albuterol, alprazolam, amlodipine, fenofibrate, fluticasone, fluticasone furoate-vilanterol, hydrochlorothiazide, metoprolol tartrate, montelukast, olmesartan, omeprazole, and rosuvastatin. Current Outpatient Medications on File Prior  to Visit  Medication Sig Dispense Refill   citalopram (CELEXA) 40 MG tablet Take 1 tablet (40 mg total) by mouth every morning. 90 tablet 3   NONFORMULARY OR COMPOUNDED ITEM Please dispense a blood pressure cuff to patient if approved through insurance. 1 each 1   nystatin  cream (MYCOSTATIN) Apply 1 application topically 2 (two) times daily. 30 g 0   Propylene Glycol-Glycerin (MOISTURE EYES OP) Apply to eye as needed.     vitamin B-12 (CYANOCOBALAMIN) 1000 MCG tablet Take 1,000 mcg by mouth daily.     No current facility-administered medications on file prior to visit.   She has No Known Allergies..  Review of Systems .Review of Systems  Constitutional: Negative for activity change, appetite change and fatigue.  HENT: Negative for hearing loss, congestion, tinnitus and ear discharge.  dentist q15mEyes: Negative for visual disturbance (see optho q1y -- vision corrected to 20/20 with glasses).  Respiratory: Negative for cough, chest tightness and shortness of breath.   Cardiovascular: Negative for chest pain, palpitations and leg swelling.  Gastrointestinal: Negative for abdominal pain, diarrhea, constipation and abdominal distention.  Genitourinary: Negative for urgency, frequency, decreased urine volume and difficulty urinating.  Musculoskeletal: Negative for back pain, arthralgias and gait problem.  Skin: Negative for color change, pallor and rash.  Neurological: Negative for dizziness, light-headedness, numbness and headaches.  Hematological: Negative for adenopathy. Does not bruise/bleed easily.  Psychiatric/Behavioral: Negative for suicidal ideas, confusion, sleep disturbance, self-injury, dysphoric mood, decreased concentration and agitation.      Objective:    BP 130/80 (BP Location: Right Arm, Patient Position: Sitting, Cuff Size: Large)   Pulse (!) 52   Temp 98.3 F (36.8 C) (Oral)   Resp 18   Ht '5\' 5"'$  (1.651 m)   Wt 183 lb 12.8 oz (83.4 kg)   SpO2 97%   BMI 30.59 kg/m  General appearance: alert, cooperative, appears stated age, and no distress Head: Normocephalic, without obvious abnormality, atraumatic Eyes: negative findings: lids and lashes normal, conjunctivae and sclerae normal, and pupils equal, round, reactive to light and  accomodation Ears: normal TM's and external ear canals both ears Neck: no adenopathy, no carotid bruit, no JVD, supple, symmetrical, trachea midline, and thyroid not enlarged, symmetric, no tenderness/mass/nodules Back: symmetric, no curvature. ROM normal. No CVA tenderness. Lungs: clear to auscultation bilaterally Breasts: normal appearance, no masses or tenderness Heart: regular rate and rhythm, S1, S2 normal, no murmur, click, rub or gallop Abdomen: soft, non-tender; bowel sounds normal; no masses,  no organomegaly Extremities: extremities normal, atraumatic, no cyanosis or edema Pulses: 2+ and symmetric Skin: Skin color, texture, turgor normal. No rashes or lesions Lymph nodes: Cervical, supraclavicular, and axillary nodes normal. Neurologic: Alert and oriented X 3, normal strength and tone. Normal symmetric reflexes. Normal coordination and gait    Assessment:    Healthy female exam.      Plan:    Ghm utd Check labs  See After Visit Summary for Counseling Recommendations   1. History of asthma stable - albuterol (PROAIR HFA) 108 (90 Base) MCG/ACT inhaler; INHALE 2 PUFFS INTO THE LUNGS EVERY 4 HOURS AS NEEDED FOR WHEEZING  Dispense: 8.5 g; Refill: 3  2. Generalized anxiety disorder Stable -- controlled with xanax  - ALPRAZolam (XANAX) 0.25 MG tablet; Take 1 tablet (0.25 mg total) by mouth 2 (two) times daily as needed for anxiety.  Dispense: 20 tablet; Refill: 0  3. Essential hypertension Well controlled, no changes to meds. Encouraged heart healthy diet such as  the DASH diet and exercise as tolerated.   - amLODipine (NORVASC) 5 MG tablet; TAKE 1 TABLET BY MOUTH  DAILY  Dispense: 90 tablet; Refill: 1 - hydrochlorothiazide (HYDRODIURIL) 25 MG tablet; Take 1 tablet (25 mg total) by mouth daily as needed.  Dispense: 90 tablet; Refill: 1 - metoprolol tartrate (LOPRESSOR) 100 MG tablet; Take 1 tablet (100 mg total) by mouth 2 (two) times daily.  Dispense: 180 tablet; Refill: 3 -  olmesartan (BENICAR) 40 MG tablet; Take 1 tablet (40 mg total) by mouth daily.  Dispense: 90 tablet; Refill: 3 - CBC with Differential/Platelet - Lipid panel - TSH - Comprehensive metabolic panel  4. Hyperlipidemia LDL goal <100 Tolerating statin, encouraged heart healthy diet, avoid trans fats, minimize simple carbs and saturated fats. Increase exercise as tolerated  - fenofibrate 160 MG tablet; Take 1 tablet (160 mg total) by mouth daily.  Dispense: 90 tablet; Refill: 3 - rosuvastatin (CRESTOR) 20 MG tablet; Take 1 tablet (20 mg total) by mouth daily.  Dispense: 90 tablet; Refill: 3 - TSH - Comprehensive metabolic panel  5. Allergy, initial encounter stable - montelukast (SINGULAIR) 10 MG tablet; Take 1 tablet (10 mg total) by mouth at bedtime.  Dispense: 90 tablet; Refill: 1  6. Gastroesophageal reflux disease, unspecified whether esophagitis present Controlled  - omeprazole (PRILOSEC) 20 MG capsule; Take 1 capsule (20 mg total) by mouth daily.  Dispense: 90 capsule; Refill: 3  7. Estrogen deficiency   - DG Bone Density; Future  8. ANXIETY DEPRESSION    9. Hypothyroidism, unspecified type    10. Preventative health care Ghm utd Check labs

## 2021-05-03 NOTE — Assessment & Plan Note (Signed)
Encourage heart healthy diet such as MIND or DASH diet, increase exercise, avoid trans fats, simple carbohydrates and processed foods, consider a krill or fish or flaxseed oil cap daily.  °

## 2021-05-03 NOTE — Patient Instructions (Signed)
Preventive Care 73 Years and Older, Female Preventive care refers to lifestyle choices and visits with your health care provider that can promote health and wellness. This includes: A yearly physical exam. This is also called an annual wellness visit. Regular dental and eye exams. Immunizations. Screening for certain conditions. Healthy lifestyle choices, such as: Eating a healthy diet. Getting regular exercise. Not using drugs or products that contain nicotine and tobacco. Limiting alcohol use. What can I expect for my preventive care visit? Physical exam Your health care provider will check your: Height and weight. These may be used to calculate your BMI (body mass index). BMI is a measurement that tells if you are at a healthy weight. Heart rate and blood pressure. Body temperature. Skin for abnormal spots. Counseling Your health care provider may ask you questions about your: Past medical problems. Family's medical history. Alcohol, tobacco, and drug use. Emotional well-being. Home life and relationship well-being. Sexual activity. Diet, exercise, and sleep habits. History of falls. Memory and ability to understand (cognition). Work and work Statistician. Pregnancy and menstrual history. Access to firearms. What immunizations do I need?  Vaccines are usually given at various ages, according to a schedule. Your health care provider will recommend vaccines for you based on your age, medicalhistory, and lifestyle or other factors, such as travel or where you work. What tests do I need? Blood tests Lipid and cholesterol levels. These may be checked every 5 years, or more often depending on your overall health. Hepatitis C test. Hepatitis B test. Screening Lung cancer screening. You may have this screening every year starting at age 73 if you have a 30-pack-year history of smoking and currently smoke or have quit within the past 15 years. Colorectal cancer screening. All  adults should have this screening starting at age 73 and continuing until age 65. Your health care provider may recommend screening at age 73 if you are at increased risk. You will have tests every 1-10 years, depending on your results and the type of screening test. Diabetes screening. This is done by checking your blood sugar (glucose) after you have not eaten for a while (fasting). You may have this done every 1-3 years. Mammogram. This may be done every 1-2 years. Talk with your health care provider about how often you should have regular mammograms. Abdominal aortic aneurysm (AAA) screening. You may need this if you are a current or former smoker. BRCA-related cancer screening. This may be done if you have a family history of breast, ovarian, tubal, or peritoneal cancers. Other tests STD (sexually transmitted disease) testing, if you are at risk. Bone density scan. This is done to screen for osteoporosis. You may have this done starting at age 73. Talk with your health care provider about your test results, treatment options,and if necessary, the need for more tests. Follow these instructions at home: Eating and drinking  Eat a diet that includes fresh fruits and vegetables, whole grains, lean protein, and low-fat dairy products. Limit your intake of foods with high amounts of sugar, saturated fats, and salt. Take vitamin and mineral supplements as recommended by your health care provider. Do not drink alcohol if your health care provider tells you not to drink. If you drink alcohol: Limit how much you have to 0-1 drink a day. Be aware of how much alcohol is in your drink. In the [place] one drink equals one 12 oz bottle of beer (355 mL), one 5 oz glass of wine (148 mL), or one 1  oz glass of hard liquor (44 mL).  Lifestyle Take daily care of your teeth and gums. Brush your teeth every morning and night with fluoride toothpaste. Floss one time each day. Stay active. Exercise for at  least 30 minutes 5 or more days each week. Do not use any products that contain nicotine or tobacco, such as cigarettes, e-cigarettes, and chewing tobacco. If you need help quitting, ask your health care provider. Do not use drugs. If you are sexually active, practice safe sex. Use a condom or other form of protection in order to prevent STIs (sexually transmitted infections). Talk with your health care provider about taking a low-dose aspirin or statin. Find healthy ways to cope with stress, such as: Meditation, yoga, or listening to music. Journaling. Talking to a trusted person. Spending time with friends and family. Safety Always wear your seat belt while driving or riding in a vehicle. Do not drive: If you have been drinking alcohol. Do not ride with someone who has been drinking. When you are tired or distracted. While texting. Wear a helmet and other protective equipment during sports activities. If you have firearms in your house, make sure you follow all gun safety procedures. What's next? Visit your health care provider once a year for an annual wellness visit. Ask your health care provider how often you should have your eyes and teeth checked. Stay up to date on all vaccines. This information is not intended to replace advice given to you by your health care provider. Make sure you discuss any questions you have with your healthcare provider. Document Revised: 08/25/2020 Document Reviewed: 08/29/2018 Elsevier Patient Education  2022 [employer].

## 2021-05-03 NOTE — Assessment & Plan Note (Signed)
Well controlled, no changes to meds. Encouraged heart healthy diet such as the DASH diet and exercise as tolerated.  °

## 2021-05-04 ENCOUNTER — Encounter: Payer: Self-pay | Admitting: Family Medicine

## 2021-05-06 MED ORDER — ROSUVASTATIN CALCIUM 40 MG PO TABS: 40.0000 mg | ORAL_TABLET | Freq: Every day | ORAL | 1 refills | Status: DC

## 2021-05-06 NOTE — Addendum Note (Signed)
Addended by: Sanda Linger on: 05/06/2021 08:40 AM   Modules accepted: Orders

## 2021-05-12 ENCOUNTER — Ambulatory Visit: Payer: Medicare Other

## 2021-05-17 ENCOUNTER — Encounter: Payer: Self-pay | Admitting: Family Medicine

## 2021-05-17 ENCOUNTER — Telehealth (INDEPENDENT_AMBULATORY_CARE_PROVIDER_SITE_OTHER): Payer: Medicare Other | Admitting: Family Medicine

## 2021-05-17 ENCOUNTER — Other Ambulatory Visit: Payer: Self-pay

## 2021-05-17 DIAGNOSIS — E039 Hypothyroidism, unspecified: Secondary | ICD-10-CM | POA: Diagnosis not present

## 2021-05-17 DIAGNOSIS — F411 Generalized anxiety disorder: Secondary | ICD-10-CM

## 2021-05-17 DIAGNOSIS — E785 Hyperlipidemia, unspecified: Secondary | ICD-10-CM | POA: Diagnosis not present

## 2021-05-17 DIAGNOSIS — I10 Essential (primary) hypertension: Secondary | ICD-10-CM | POA: Diagnosis not present

## 2021-05-17 DIAGNOSIS — J0140 Acute pansinusitis, unspecified: Secondary | ICD-10-CM | POA: Diagnosis not present

## 2021-05-17 MED ORDER — AMLODIPINE BESYLATE 5 MG PO TABS: ORAL_TABLET | ORAL | 1 refills | Status: DC

## 2021-05-17 MED ORDER — AMOXICILLIN-POT CLAVULANATE 875-125 MG PO TABS: 1.0000 | ORAL_TABLET | Freq: Two times a day (BID) | ORAL | 0 refills | Status: DC

## 2021-05-17 MED ORDER — OLMESARTAN MEDOXOMIL 40 MG PO TABS: 40.0000 mg | ORAL_TABLET | Freq: Every day | ORAL | 3 refills | Status: DC

## 2021-05-17 MED ORDER — FENOFIBRATE 160 MG PO TABS: 160.0000 mg | ORAL_TABLET | Freq: Every day | ORAL | 3 refills | Status: DC

## 2021-05-17 MED ORDER — CITALOPRAM HYDROBROMIDE 40 MG PO TABS: 40.0000 mg | ORAL_TABLET | Freq: Every morning | ORAL | 3 refills | Status: DC

## 2021-05-17 MED ORDER — ROSUVASTATIN CALCIUM 40 MG PO TABS: 40.0000 mg | ORAL_TABLET | Freq: Every day | ORAL | 1 refills | Status: DC

## 2021-05-17 NOTE — Assessment & Plan Note (Signed)
Check labs 

## 2021-05-17 NOTE — Assessment & Plan Note (Signed)
Encourage heart healthy diet such as MIND or DASH diet, increase exercise, avoid trans fats, simple carbohydrates and processed foods, consider a krill or fish or flaxseed oil cap daily.  °

## 2021-05-17 NOTE — Assessment & Plan Note (Signed)
Well controlled, no changes to meds. Encouraged heart healthy diet such as the DASH diet and exercise as tolerated.  °

## 2021-05-17 NOTE — Assessment & Plan Note (Signed)
flonase and antihistamine abx per orders  F/u prn

## 2021-05-17 NOTE — Progress Notes (Signed)
MyChart Video Visit    Virtual Visit via Video Note   This visit type was conducted due to national recommendations for restrictions regarding the COVID-19 Pandemic (e.g. social distancing) in an effort to limit this patient's exposure and mitigate transmission in our community. This patient is at least at moderate risk for complications without adequate follow up. This format is felt to be most appropriate for this patient at this time. Physical exam was limited by quality of the video and audio technology used for the visit. Alinda Dooms was able to get the patient set up on a video visit.  Patient location: Home Patient and provider in visit Provider location: Office  I discussed the limitations of evaluation and management by telemedicine and the availability of in person appointments. The patient expressed understanding and agreed to proceed.  Visit Date: 05/17/2021  Today's healthcare provider: [name], DO     Subjective:    Patient ID: Wendy Christensen, female    DOB: 13-May-1946, 75 y.o.   MRN: [id number]  Chief Complaint  Patient presents with   Sinus Problem    HPI Patient is in today for a video visit. She complains of blood in her mucous after blowing out her nose and congestion. She is coughing and has sinus pressure as well but denies having any fevers at this time. She using Flonase nasal spray and Claritin to manage her symptoms and finds mild relief. She has a history of sinus issues. She has taken a Covid-19 test today and found that she was negative. She is requesting for medication to improve her symptoms.  She is requesting a refill for 160 mg fenofibrate daily PO, 40 mg benicar daily PO, 40 mg Crestor daily PO, 5 mg amlodipine daily PO, 40 mg Celexa daily PO.    Past Medical History:  Diagnosis Date   Allergic rhinitis    Allergy    seasonal   Anxiety    Asthma, intrinsic    Cataract    bilateral repair   Depression     Frequency of urination    GERD (gastroesophageal reflux disease)    Hyperlipidemia    Hypertension    Kidney stone    Left kidney-calcium    OSA on CPAP    PT IN PROCESS OF GETTING MACHINE SET UP   Sleep apnea    cpap   Thyroid goiter    Urgency of urination    Wears glasses     Past Surgical History:  Procedure Laterality Date   BREAST BIOPSY     CATARACT EXTRACTION, BILATERAL     COLONOSCOPY  2013   CYSTOSCOPY WITH RETROGRADE PYELOGRAM, URETEROSCOPY AND STENT PLACEMENT Left 06/21/2015   Procedure: CYSTOSCOPY WITH RETROGRADE PYELOGRAM, URETEROSCOPY AND STENT PLACEMENT;  Surgeon: Nickie Retort, MD;  Location: [facility];  Service: Urology;  Laterality: Left;   DILATION AND CURETTAGE OF UTERUS  1990   W/ HYSTEROSCOPY   EYE SURGERY     retina both eyes 6/19   HOLMIUM LASER APPLICATION Left 0000000   Procedure: HOLMIUM LASER APPLICATION;  Surgeon: Nickie Retort, MD;  Location: [facility];  Service: Urology;  Laterality: Left;   STONE EXTRACTION WITH BASKET Left 06/21/2015   Procedure: STONE EXTRACTION WITH BASKET;  Surgeon: Nickie Retort, MD;  Location: [facility];  Service: Urology;  Laterality: Left;   TUBAL LIGATION  1980's    Family History  Problem Relation Age of Onset  Hypertension Mother    Heart disease Mother    Stroke Mother    Heart attack Mother    COPD Father    Asthma Father    Stroke Brother    Hypertension Brother    Hypertension Brother    Kidney cancer Maternal Aunt    Heart attack Maternal Aunt    Breast cancer Cousin    Breast cancer Cousin    Hyperlipidemia Other    Hypertension Other    Colon cancer Neg Hx    Colon polyps Neg Hx    Esophageal cancer Neg Hx    Rectal cancer Neg Hx    Stomach cancer Neg Hx     Social History   Socioeconomic History   Marital status: Married    Spouse name: Not on file   Number of children: Not on file   Years of education: Not on file    Highest education level: Not on file  Occupational History   Occupation: Retired    Fish farm manager: DISABLE  Tobacco Use   Smoking status: Never   Smokeless tobacco: Never  Vaping Use   Vaping Use: Never used  Substance and Sexual Activity   Alcohol use: Not Currently    Alcohol/week: 0.0 standard drinks   Drug use: No   Sexual activity: Yes    Partners: Male    Comment: Husband   Other Topics Concern   Not on file  Social History Narrative   Exercise--babysitting---   Social Determinants of Health   Financial Resource Strain: Low Risk    Difficulty of Paying Living Expenses: Not very hard  Food Insecurity: Not on file  Transportation Needs: No Transportation Needs   Lack of Transportation (Medical): No   Lack of Transportation (Non-Medical): No  Physical Activity: Insufficiently Active   Days of Exercise per Week: 4 days   Minutes of Exercise per Session: 10 min  Stress: Not on file  Social Connections: Not on file  Intimate Partner Violence: Not on file    Outpatient Medications Prior to Visit  Medication Sig Dispense Refill   albuterol (PROAIR HFA) 108 (90 Base) MCG/ACT inhaler INHALE 2 PUFFS INTO THE LUNGS EVERY 4 HOURS AS NEEDED FOR WHEEZING 8.5 g 3   ALPRAZolam (XANAX) 0.25 MG tablet Take 1 tablet (0.25 mg total) by mouth 2 (two) times daily as needed for anxiety. 20 tablet 0   fluticasone (FLONASE) 50 MCG/ACT nasal spray Place 2 sprays into both nostrils as needed. 16 g 3   fluticasone furoate-vilanterol (BREO ELLIPTA) 200-25 MCG/INH AEPB Inhale 1 puff into the lungs daily. Per Dr. Donneta Romberg 3 each 3   hydrochlorothiazide (HYDRODIURIL) 25 MG tablet Take 1 tablet (25 mg total) by mouth daily as needed. 90 tablet 1   loratadine (CLARITIN) 10 MG tablet Take 1 tablet (10 mg total) by mouth daily. 90 tablet 3   metoprolol tartrate (LOPRESSOR) 100 MG tablet Take 1 tablet (100 mg total) by mouth 2 (two) times daily. 180 tablet 3   montelukast (SINGULAIR) 10 MG tablet Take 1  tablet (10 mg total) by mouth at bedtime. 90 tablet 1   NONFORMULARY OR COMPOUNDED ITEM Please dispense a blood pressure cuff to patient if approved through insurance. 1 each 1   nystatin cream (MYCOSTATIN) Apply 1 application topically 2 (two) times daily. 30 g 0   omeprazole (PRILOSEC) 20 MG capsule Take 1 capsule (20 mg total) by mouth daily. 90 capsule 3   Propylene Glycol-Glycerin (MOISTURE EYES OP) Apply to eye as  needed.     vitamin B-12 (CYANOCOBALAMIN) 1000 MCG tablet Take 1,000 mcg by mouth daily.     amLODipine (NORVASC) 5 MG tablet TAKE 1 TABLET BY MOUTH  DAILY 90 tablet 1   citalopram (CELEXA) 40 MG tablet Take 1 tablet (40 mg total) by mouth every morning. 90 tablet 3   fenofibrate 160 MG tablet Take 1 tablet (160 mg total) by mouth daily. 90 tablet 3   olmesartan (BENICAR) 40 MG tablet Take 1 tablet (40 mg total) by mouth daily. 90 tablet 3   rosuvastatin (CRESTOR) 40 MG tablet Take 1 tablet (40 mg total) by mouth daily. 90 tablet 1   No facility-administered medications prior to visit.    No Known Allergies  Review of Systems  Constitutional:  Negative for fever and malaise/fatigue.  HENT:  Positive for congestion and sinus pain (pressure).        (+)blood in mucous  Eyes:  Negative for blurred vision.  Respiratory:  Positive for cough. Negative for shortness of breath.   Cardiovascular:  Negative for chest pain, palpitations and leg swelling.  Gastrointestinal:  Negative for vomiting.  Musculoskeletal:  Negative for back pain.  Skin:  Negative for rash.  Neurological:  Negative for loss of consciousness and headaches.      Objective:    Physical Exam Vitals and nursing note reviewed.  Constitutional:      General: She is not in acute distress.    Appearance: Normal appearance. She is not ill-appearing.  HENT:     Head: Normocephalic and atraumatic.     Right Ear: External ear normal.     Left Ear: External ear normal.  Neurological:     Mental Status: She is  alert.  Psychiatric:        Behavior: Behavior normal.        Judgment: Judgment normal.    There were no vitals taken for this visit. Wt Readings from Last 3 Encounters:  05/03/21 183 lb 12.8 oz (83.4 kg)  02/18/21 181 lb (82.1 kg)  02/04/21 181 lb (82.1 kg)    Diabetic Foot Exam - Simple   No data filed    Lab Results  Component Value Date   WBC 5.8 05/03/2021   HGB 11.0 (L) 05/03/2021   HCT 33.9 (L) 05/03/2021   PLT 268.0 05/03/2021   GLUCOSE 82 05/03/2021   CHOL 254 (H) 05/03/2021   TRIG 131.0 05/03/2021   HDL 55.80 05/03/2021   LDLDIRECT 215.7 10/18/2011   LDLCALC 172 (H) 05/03/2021   ALT 11 05/03/2021   AST 17 05/03/2021   NA 138 05/03/2021   K 4.5 05/03/2021   CL 102 05/03/2021   CREATININE 1.23 (H) 05/03/2021   BUN 19 05/03/2021   CO2 31 05/03/2021   TSH 1.01 05/03/2021   HGBA1C 6.4 09/14/2014   MICROALBUR <0.7 03/23/2015    Lab Results  Component Value Date   TSH 1.01 05/03/2021   Lab Results  Component Value Date   WBC 5.8 05/03/2021   HGB 11.0 (L) 05/03/2021   HCT 33.9 (L) 05/03/2021   MCV 83.9 05/03/2021   PLT 268.0 05/03/2021   Lab Results  Component Value Date   NA 138 05/03/2021   K 4.5 05/03/2021   CO2 31 05/03/2021   GLUCOSE 82 05/03/2021   BUN 19 05/03/2021   CREATININE 1.23 (H) 05/03/2021   BILITOT 0.4 05/03/2021   ALKPHOS 54 05/03/2021   AST 17 05/03/2021   ALT 11 05/03/2021   PROT 7.3  05/03/2021   ALBUMIN 3.9 05/03/2021   CALCIUM 9.4 05/03/2021   ANIONGAP 9 02/24/2015   GFR 43.61 (L) 05/03/2021   Lab Results  Component Value Date   CHOL 254 (H) 05/03/2021   Lab Results  Component Value Date   HDL 55.80 05/03/2021   Lab Results  Component Value Date   LDLCALC 172 (H) 05/03/2021   Lab Results  Component Value Date   TRIG 131.0 05/03/2021   Lab Results  Component Value Date   CHOLHDL 5 05/03/2021   Lab Results  Component Value Date   HGBA1C 6.4 09/14/2014       Assessment & Plan:   Problem List Items  Addressed This Visit       Unprioritized   SINUSITIS- ACUTE-NOS - Primary   Relevant Medications   amoxicillin-clavulanate (AUGMENTIN) 875-125 MG tablet   GENERALIZED ANXIETY DISORDER   Relevant Medications   citalopram (CELEXA) 40 MG tablet   Essential hypertension   Relevant Medications   amLODipine (NORVASC) 5 MG tablet   fenofibrate 160 MG tablet   olmesartan (BENICAR) 40 MG tablet   rosuvastatin (CRESTOR) 40 MG tablet   Hyperlipidemia LDL goal <100   Relevant Medications   amLODipine (NORVASC) 5 MG tablet   fenofibrate 160 MG tablet   olmesartan (BENICAR) 40 MG tablet   rosuvastatin (CRESTOR) 40 MG tablet     Meds ordered this encounter  Medications   amoxicillin-clavulanate (AUGMENTIN) 875-125 MG tablet    Sig: Take 1 tablet by mouth 2 (two) times daily.    Dispense:  20 tablet    Refill:  0   amLODipine (NORVASC) 5 MG tablet    Sig: TAKE 1 TABLET BY MOUTH  DAILY    Dispense:  90 tablet    Refill:  1    Requesting 1 year supply   citalopram (CELEXA) 40 MG tablet    Sig: Take 1 tablet (40 mg total) by mouth every morning.    Dispense:  90 tablet    Refill:  3   fenofibrate 160 MG tablet    Sig: Take 1 tablet (160 mg total) by mouth daily.    Dispense:  90 tablet    Refill:  3    Requesting 1 year supply   olmesartan (BENICAR) 40 MG tablet    Sig: Take 1 tablet (40 mg total) by mouth daily.    Dispense:  90 tablet    Refill:  3    Requesting 1 year supply   rosuvastatin (CRESTOR) 40 MG tablet    Sig: Take 1 tablet (40 mg total) by mouth daily.    Dispense:  90 tablet    Refill:  1    I discussed the assessment and treatment plan with the patient. The patient was provided an opportunity to ask questions and all were answered. The patient agreed with the plan and demonstrated an understanding of the instructions.   The patient was advised to call back or seek an in-person evaluation if the symptoms worsen or if the condition fails to improve as  anticipated.  I,Shehryar Baig,acting as a Education administrator for [employer], DO.,have documented all relevant documentation on the behalf of [name], DO,as directed by  [name], DO while in the presence of [name], DO.  I provided 20 minutes of face-to-face time during this encounter.   [name], DO [place] [facility] [employer] [facility] [phone] (phone) [phone] (fax)   Medical Group  

## 2021-05-25 ENCOUNTER — Ambulatory Visit: Payer: Medicare Other

## 2021-05-25 NOTE — Progress Notes (Deleted)
Subjective:   Kevin Brownell is a 75 y.o. female who presents for Medicare Annual (Subsequent) preventive examination.  Review of Systems    ***       Objective:    There were no vitals filed for this visit. There is no height or weight on file to calculate BMI.  Advanced Directives 11/17/2016 10/15/2015 07/07/2015 06/21/2015 03/26/2015 02/24/2015  Does Patient Have a Medical Advance Directive? No No No Yes Yes Yes  Type of Advance Directive - - - - Living will Living will;Healthcare Power of Attorney  Does patient want to make changes to medical advance directive? - - No - Patient declined Yes - information given No - Patient declined -  Copy of [place] in Chart? - - No - copy requested - No - copy requested -  Would patient like information on creating a medical advance directive? No - Patient declined Yes - Scientist, clinical (histocompatibility and immunogenetics) given - - - -    Current Medications (verified) Outpatient Encounter Medications as of 05/25/2021  Medication Sig   albuterol (PROAIR HFA) 108 (90 Base) MCG/ACT inhaler INHALE 2 PUFFS INTO THE LUNGS EVERY 4 HOURS AS NEEDED FOR WHEEZING   ALPRAZolam (XANAX) 0.25 MG tablet Take 1 tablet (0.25 mg total) by mouth 2 (two) times daily as needed for anxiety.   amLODipine (NORVASC) 5 MG tablet TAKE 1 TABLET BY MOUTH  DAILY   amoxicillin-clavulanate (AUGMENTIN) 875-125 MG tablet Take 1 tablet by mouth 2 (two) times daily.   citalopram (CELEXA) 40 MG tablet Take 1 tablet (40 mg total) by mouth every morning.   fenofibrate 160 MG tablet Take 1 tablet (160 mg total) by mouth daily.   fluticasone (FLONASE) 50 MCG/ACT nasal spray Place 2 sprays into both nostrils as needed.   fluticasone furoate-vilanterol (BREO ELLIPTA) 200-25 MCG/INH AEPB Inhale 1 puff into the lungs daily. Per Dr. Donneta Romberg   hydrochlorothiazide (HYDRODIURIL) 25 MG tablet Take 1 tablet (25 mg total) by mouth daily as needed.   loratadine (CLARITIN) 10 MG tablet Take 1 tablet (10 mg  total) by mouth daily.   metoprolol tartrate (LOPRESSOR) 100 MG tablet Take 1 tablet (100 mg total) by mouth 2 (two) times daily.   montelukast (SINGULAIR) 10 MG tablet Take 1 tablet (10 mg total) by mouth at bedtime.   NONFORMULARY OR COMPOUNDED ITEM Please dispense a blood pressure cuff to patient if approved through insurance.   nystatin cream (MYCOSTATIN) Apply 1 application topically 2 (two) times daily.   olmesartan (BENICAR) 40 MG tablet Take 1 tablet (40 mg total) by mouth daily.   omeprazole (PRILOSEC) 20 MG capsule Take 1 capsule (20 mg total) by mouth daily.   Propylene Glycol-Glycerin (MOISTURE EYES OP) Apply to eye as needed.   rosuvastatin (CRESTOR) 40 MG tablet Take 1 tablet (40 mg total) by mouth daily.   vitamin B-12 (CYANOCOBALAMIN) 1000 MCG tablet Take 1,000 mcg by mouth daily.   No facility-administered encounter medications on file as of 05/25/2021.    Allergies (verified) Patient has no known allergies.   History: Past Medical History:  Diagnosis Date   Allergic rhinitis    Allergy    seasonal   Anxiety    Asthma, intrinsic    Cataract    bilateral repair   Depression    Frequency of urination    GERD (gastroesophageal reflux disease)    Hyperlipidemia    Hypertension    Kidney stone    Left kidney-calcium    OSA on  CPAP    PT IN PROCESS OF GETTING MACHINE SET UP   Sleep apnea    cpap   Thyroid goiter    Urgency of urination    Wears glasses    Past Surgical History:  Procedure Laterality Date   BREAST BIOPSY     CATARACT EXTRACTION, BILATERAL     COLONOSCOPY  2013   CYSTOSCOPY WITH RETROGRADE PYELOGRAM, URETEROSCOPY AND STENT PLACEMENT Left 06/21/2015   Procedure: CYSTOSCOPY WITH RETROGRADE PYELOGRAM, URETEROSCOPY AND STENT PLACEMENT;  Surgeon: Nickie Retort, MD;  Location: [facility];  Service: Urology;  Laterality: Left;   DILATION AND CURETTAGE OF UTERUS  1990   W/ HYSTEROSCOPY   EYE SURGERY     retina both eyes 6/19    HOLMIUM LASER APPLICATION Left 0000000   Procedure: HOLMIUM LASER APPLICATION;  Surgeon: Nickie Retort, MD;  Location: [facility];  Service: Urology;  Laterality: Left;   STONE EXTRACTION WITH BASKET Left 06/21/2015   Procedure: STONE EXTRACTION WITH BASKET;  Surgeon: Nickie Retort, MD;  Location: [facility];  Service: Urology;  Laterality: Left;   TUBAL LIGATION  10's   Family History  Problem Relation Age of Onset   Hypertension Mother    Heart disease Mother    Stroke Mother    Heart attack Mother    COPD Father    Asthma Father    Stroke Brother    Hypertension Brother    Hypertension Brother    Kidney cancer Maternal Aunt    Heart attack Maternal Aunt    Breast cancer Cousin    Breast cancer Cousin    Hyperlipidemia Other    Hypertension Other    Colon cancer Neg Hx    Colon polyps Neg Hx    Esophageal cancer Neg Hx    Rectal cancer Neg Hx    Stomach cancer Neg Hx    Social History   Socioeconomic History   Marital status: Married    Spouse name: Not on file   Number of children: Not on file   Years of education: Not on file   Highest education level: Not on file  Occupational History   Occupation: Retired    Fish farm manager: DISABLE  Tobacco Use   Smoking status: Never   Smokeless tobacco: Never  Vaping Use   Vaping Use: Never used  Substance and Sexual Activity   Alcohol use: Not Currently    Alcohol/week: 0.0 standard drinks   Drug use: No   Sexual activity: Yes    Partners: Male    Comment: Husband   Other Topics Concern   Not on file  Social History Narrative   Exercise--babysitting---   Social Determinants of Health   Financial Resource Strain: Low Risk    Difficulty of Paying Living Expenses: Not very hard  Food Insecurity: Not on file  Transportation Needs: No Transportation Needs   Lack of Transportation (Medical): No   Lack of Transportation (Non-Medical): No  Physical Activity: Insufficiently Active    Days of Exercise per Week: 4 days   Minutes of Exercise per Session: 10 min  Stress: Not on file  Social Connections: Not on file    Tobacco Counseling Counseling given: Not Answered   Clinical Intake:                 Diabetic?No         Activities of Daily Living In your present state of health, do you have any difficulty  performing the following activities: 05/03/2021  Hearing? N  Vision? N  Difficulty concentrating or making decisions? N  Walking or climbing stairs? Y  Dressing or bathing? N  Doing errands, shopping? N  Some recent data might be hidden    Patient Care Team: Carollee Herter, Alferd Apa, DO as PCP - General Inda Castle, MD (Inactive) as Consulting Physician (Gastroenterology) Rexene Agent, MD as Attending Physician (Nephrology) Mosetta Anis, MD as Referring Physician (Allergy) Mincey, Neena Rhymes, MD as Consulting Physician (Ophthalmology) Sanjuana Kava, MD as Referring Physician (Obstetrics and Gynecology) Pedro Earls, MD as Attending Physician (Family Medicine) Cherre Robins, PharmD (Pharmacist)  Indicate any recent Medical Services you may have received from other than [facility] providers in the past year (date may be approximate).     Assessment:   This is a routine wellness examination for Nambe.  Hearing/Vision screen No results found.  Dietary issues and exercise activities discussed:     Goals Addressed   None    Depression Screen PHQ 2/9 Scores 05/03/2021 01/30/2020 03/08/2018 11/17/2016 10/15/2015 07/07/2015 09/22/2013  PHQ - 2 Score 1 3 0 0 0 0 0  PHQ- 9 Score - 9 - - - - -    Fall Risk Fall Risk  01/05/2021 05/11/2020 03/08/2018 11/17/2016 10/15/2015  Falls in the past year? 0 0 No No No  Number falls in past yr: 0 0 - - -  Comment - - - - -  Injury with Fall? 0 0 - - -  Follow up - Falls evaluation completed - - -    FALL RISK PREVENTION PERTAINING TO THE HOME:  Any stairs in or around the home?  {YES/NO:21197} If so, are there any without handrails? {YES/NO:21197} Home free of loose throw rugs in walkways, pet beds, electrical cords, etc? {YES/NO:21197} Adequate lighting in your home to reduce risk of falls? {YES/NO:21197}  ASSISTIVE DEVICES UTILIZED TO PREVENT FALLS:  Life alert? {YES/NO:21197} Use of a cane, walker or w/c? {YES/NO:21197} Grab bars in the bathroom? {YES/NO:21197} Shower chair or bench in shower? {YES/NO:21197} Elevated toilet seat or a handicapped toilet? {YES/NO:21197}  TIMED UP AND GO:  Was the test performed? {YES/NO:21197}.  Length of time to ambulate 10 feet: *** sec.   {Appearance of Gait:2101803}  Cognitive Function: MMSE - Mini Mental State Exam 07/07/2015  Orientation to time 5  Orientation to Place 5  Registration 3  Attention/ Calculation 5  Recall 2  Language- name 2 objects 2  Language- repeat 1  Language- follow 3 step command 3  Language- read & follow direction 1  Write a sentence 1  Copy design 1  Total score 29        Immunizations Immunization History  Administered Date(s) Administered   Influenza Split 05/29/2012, 06/17/2013   Influenza Whole 07/31/2007   Influenza, High Dose Seasonal PF 07/02/2015, 05/24/2017, 07/16/2018   Influenza-Unspecified 08/07/2014, 06/19/2016   PFIZER Comirnaty(Gray Top)Covid-19 Tri-Sucrose Vaccine 03/14/2021   PFIZER(Purple Top)SARS-COV-2 Vaccination 11/11/2019, 12/02/2019, 07/17/2020   Pneumococcal Conjugate-13 07/07/2015   Pneumococcal Polysaccharide-23 12/16/2012   Td 04/18/2005   Zoster, Live 04/24/2008    TDAP status: Due, Education has been provided regarding the importance of this vaccine. Advised may receive this vaccine at local pharmacy or Health Dept. Aware to provide a copy of the vaccination record if obtained from local pharmacy or Health Dept. Verbalized acceptance and understanding.  Flu Vaccine status: Due, Education has been provided regarding the importance of this  vaccine. Advised may receive this vaccine at  local pharmacy or Health Dept. Aware to provide a copy of the vaccination record if obtained from local pharmacy or Health Dept. Verbalized acceptance and understanding.  Pneumococcal vaccine status: Up to date  Covid-19 vaccine status: Completed vaccines  Qualifies for Shingles Vaccine? Yes   Zostavax completed Yes   Shingrix Completed?: No.    Education has been provided regarding the importance of this vaccine. Patient has been advised to call insurance company to determine out of pocket expense if they have not yet received this vaccine. Advised may also receive vaccine at local pharmacy or Health Dept. Verbalized acceptance and understanding.  Screening Tests Health Maintenance  Topic Date Due   Zoster Vaccines- Shingrix (1 of 2) Never done   DEXA SCAN  01/22/2011   TETANUS/TDAP  04/19/2015   INFLUENZA VACCINE  04/18/2021   COVID-19 Vaccine (5 - Booster for Pfizer series) 07/14/2021   MAMMOGRAM  08/30/2021   COLONOSCOPY (Pts 45-28yr Insurance coverage will need to be confirmed)  02/19/2024   Hepatitis C Screening  Completed   PNA vac Low Risk Adult  Completed   HPV VACCINES  Aged Out    Health Maintenance  Health Maintenance Due  Topic Date Due   Zoster Vaccines- Shingrix (1 of 2) Never done   DEXA SCAN  01/22/2011   TETANUS/TDAP  04/19/2015   INFLUENZA VACCINE  04/18/2021    Colorectal cancer screening: Type of screening: Colonoscopy. Completed 02/18/2021. Repeat every 3 years  Mammogram status: Completed Bilateral 08/30/2020. Repeat every year  {Bone Density status:21018021}  Lung Cancer Screening: (Low Dose CT Chest recommended if Age 73-80years, 30 pack-year currently smoking OR have quit w/in 15years.) does not qualify.     Additional Screening:  Hepatitis C Screening: Completed 10/15/2015  Vision Screening: Recommended annual ophthalmology exams for early detection of glaucoma and other disorders of the eye. Is  the patient up to date with their annual eye exam?  {YES/NO:21197} Who is the provider or what is the name of the office in which the patient attends annual eye exams? *** If pt is not established with a provider, would they like to be referred to a provider to establish care? {YES/NO:21197}.   Dental Screening: Recommended annual dental exams for proper oral hygiene  Community Resource Referral / Chronic Care Management: CRR required this visit?  {YES/NO:21197}  CCM required this visit?  {YES/NO:21197}     Plan:     I have personally reviewed and noted the following in the patient's chart:   Medical and social history Use of alcohol, tobacco or illicit drugs  Current medications and supplements including opioid prescriptions.  Functional ability and status Nutritional status Physical activity Advanced directives List of other physicians Hospitalizations, surgeries, and ER visits in previous 12 months Vitals Screenings to include cognitive, depression, and falls Referrals and appointments  In addition, I have reviewed and discussed with patient certain preventive protocols, quality metrics, and best practice recommendations. A written personalized care plan for preventive services as well as general preventive health recommendations were provided to patient.     MMarta Antu LPN   [id number] Nurse Health Advisor  Nurse Notes: ***

## 2021-06-17 DIAGNOSIS — G4733 Obstructive sleep apnea (adult) (pediatric): Secondary | ICD-10-CM | POA: Diagnosis not present

## 2021-06-22 ENCOUNTER — Telehealth (HOSPITAL_BASED_OUTPATIENT_CLINIC_OR_DEPARTMENT_OTHER): Payer: Self-pay

## 2021-07-06 ENCOUNTER — Telehealth: Payer: Self-pay | Admitting: Family Medicine

## 2021-07-06 DIAGNOSIS — N183 Chronic kidney disease, stage 3 unspecified: Secondary | ICD-10-CM

## 2021-07-06 NOTE — Telephone Encounter (Signed)
Pt. Called and stated that her kidney dr is requesting a new referral for her since its been awhile since shes been seen.   [facility]

## 2021-07-07 NOTE — Telephone Encounter (Signed)
Left message on machine that referral has been placed. 

## 2021-07-11 ENCOUNTER — Telehealth (HOSPITAL_BASED_OUTPATIENT_CLINIC_OR_DEPARTMENT_OTHER): Payer: Self-pay

## 2021-09-02 ENCOUNTER — Other Ambulatory Visit: Payer: Self-pay | Admitting: Family Medicine

## 2021-09-02 DIAGNOSIS — Z1231 Encounter for screening mammogram for malignant neoplasm of breast: Secondary | ICD-10-CM

## 2021-09-15 ENCOUNTER — Ambulatory Visit: Payer: Medicare Other | Attending: Internal Medicine

## 2021-09-15 DIAGNOSIS — Z23 Encounter for immunization: Secondary | ICD-10-CM

## 2021-09-15 NOTE — Progress Notes (Signed)
° °  Covid-19 Vaccination Clinic  Name:  Wendy Christensen    MRN: [id number] DOB: Dec 14, 1945  09/15/2021  [name] was observed post Covid-19 immunization for 15 minutes without incident. She was provided with Vaccine Information Sheet and instruction to access the V-Safe system.   [name] was instructed to call 911 with any severe reactions post vaccine: Difficulty breathing  Swelling of face and throat  A fast heartbeat  A bad rash all over body  Dizziness and weakness   Immunizations Administered     Name Date Dose VIS Date Route   Pfizer Covid-19 Vaccine Bivalent Booster 09/15/2021  2:29 PM 0.3 mL 05/18/2021 Intramuscular   Manufacturer: [employer]   Lot: [id number]   [place]: [phone]

## 2021-09-16 ENCOUNTER — Other Ambulatory Visit (HOSPITAL_BASED_OUTPATIENT_CLINIC_OR_DEPARTMENT_OTHER): Payer: Self-pay

## 2021-09-16 MED ORDER — PFIZER COVID-19 VAC BIVALENT 30 MCG/0.3ML IM SUSP
INTRAMUSCULAR | 0 refills | Status: DC
  Filled 2021-09-16: qty 0.3, 1d supply, fill #0

## 2021-10-06 ENCOUNTER — Ambulatory Visit
Admission: RE | Admit: 2021-10-06 | Discharge: 2021-10-06 | Disposition: A | Discharge status: Home / Self Care | Payer: Medicare Other | Source: Ambulatory Visit | Attending: Family Medicine | Admitting: Family Medicine

## 2021-10-06 DIAGNOSIS — Z1231 Encounter for screening mammogram for malignant neoplasm of breast: Secondary | ICD-10-CM

## 2021-10-18 DIAGNOSIS — G4733 Obstructive sleep apnea (adult) (pediatric): Secondary | ICD-10-CM | POA: Diagnosis not present

## 2021-10-25 DIAGNOSIS — N183 Chronic kidney disease, stage 3 unspecified: Secondary | ICD-10-CM | POA: Diagnosis not present

## 2021-10-25 DIAGNOSIS — I129 Hypertensive chronic kidney disease with stage 1 through stage 4 chronic kidney disease, or unspecified chronic kidney disease: Secondary | ICD-10-CM | POA: Diagnosis not present

## 2021-10-25 DIAGNOSIS — N390 Urinary tract infection, site not specified: Secondary | ICD-10-CM | POA: Diagnosis not present

## 2021-10-25 DIAGNOSIS — N200 Calculus of kidney: Secondary | ICD-10-CM | POA: Diagnosis not present

## 2021-11-03 ENCOUNTER — Encounter: Payer: Self-pay | Admitting: Family Medicine

## 2021-11-03 ENCOUNTER — Ambulatory Visit (INDEPENDENT_AMBULATORY_CARE_PROVIDER_SITE_OTHER): Payer: Medicare Other | Admitting: Family Medicine

## 2021-11-03 VITALS — BP 160/88 | HR 57 | Temp 98.7°F | Resp 18 | Ht 65.0 in | Wt 187.6 lb

## 2021-11-03 DIAGNOSIS — E785 Hyperlipidemia, unspecified: Secondary | ICD-10-CM

## 2021-11-03 DIAGNOSIS — I10 Essential (primary) hypertension: Secondary | ICD-10-CM | POA: Diagnosis not present

## 2021-11-03 DIAGNOSIS — J302 Other seasonal allergic rhinitis: Secondary | ICD-10-CM | POA: Diagnosis not present

## 2021-11-03 LAB — COMPREHENSIVE METABOLIC PANEL
ALT: 14 U/L (ref 0–35)
AST: 19 U/L (ref 0–37)
Albumin: 4.3 g/dL (ref 3.5–5.2)
Alkaline Phosphatase: 49 U/L (ref 39–117)
BUN: 23 mg/dL (ref 6–23)
CO2: 35 mEq/L — ABNORMAL HIGH (ref 19–32)
Calcium: 9.4 mg/dL (ref 8.4–10.5)
Chloride: 99 mEq/L (ref 96–112)
Creatinine, Ser: 1.17 mg/dL (ref 0.40–1.20)
GFR: 46.14 mL/min — ABNORMAL LOW (ref >60.00)
Glucose, Bld: 92 mg/dL (ref 70–99)
Potassium: 3.5 mEq/L (ref 3.5–5.1)
Sodium: 136 mEq/L (ref 135–145)
Total Bilirubin: 0.6 mg/dL (ref 0.2–1.2)
Total Protein: 7.4 g/dL (ref 6.0–8.3)

## 2021-11-03 LAB — LIPID PANEL
Cholesterol: 202 mg/dL — ABNORMAL HIGH (ref 0–200)
HDL: 62.6 mg/dL (ref >39.00)
LDL Cholesterol: 121 mg/dL — ABNORMAL HIGH (ref 0–99)
NonHDL: 138.91
Total CHOL/HDL Ratio: 3
Triglycerides: 88 mg/dL (ref 0.0–149.0)
VLDL: 17.6 mg/dL (ref 0.0–40.0)

## 2021-11-03 LAB — CBC WITH DIFFERENTIAL/PLATELET
Basophils Absolute: 0 10*3/uL (ref 0.0–0.1)
Basophils Relative: 0.6 % (ref 0.0–3.0)
Eosinophils Absolute: 0.3 10*3/uL (ref 0.0–0.7)
Eosinophils Relative: 5.5 % — ABNORMAL HIGH (ref 0.0–5.0)
HCT: 35.4 % — ABNORMAL LOW (ref 36.0–46.0)
Hemoglobin: 11.4 g/dL — ABNORMAL LOW (ref 12.0–15.0)
Lymphocytes Relative: 21.7 % (ref 12.0–46.0)
Lymphs Abs: 1.2 10*3/uL (ref 0.7–4.0)
MCHC: 32.3 g/dL (ref 30.0–36.0)
MCV: 84.9 fl (ref 78.0–100.0)
Monocytes Absolute: 0.8 10*3/uL (ref 0.1–1.0)
Monocytes Relative: 13.8 % — ABNORMAL HIGH (ref 3.0–12.0)
Neutro Abs: 3.2 10*3/uL (ref 1.4–7.7)
Neutrophils Relative %: 58.4 % (ref 43.0–77.0)
Platelets: 266 10*3/uL (ref 150.0–400.0)
RBC: 4.17 Mil/uL (ref 3.87–5.11)
RDW: 14.2 % (ref 11.5–15.5)
WBC: 5.5 10*3/uL (ref 4.0–10.5)

## 2021-11-03 LAB — POC COVID19 BINAXNOW: SARS Coronavirus 2 Ag: NEGATIVE

## 2021-11-03 MED ORDER — AZELASTINE HCL 0.1 % NA SOLN: 1.0000 | Freq: Two times a day (BID) | NASAL | 12 refills | Status: DC

## 2021-11-03 MED ORDER — FENOFIBRATE 160 MG PO TABS: 160.0000 mg | ORAL_TABLET | Freq: Every day | ORAL | 3 refills | Status: DC

## 2021-11-03 MED ORDER — AMLODIPINE BESYLATE 10 MG PO TABS: 10.0000 mg | ORAL_TABLET | Freq: Every day | ORAL | 1 refills | Status: DC

## 2021-11-03 NOTE — Progress Notes (Signed)
Subjective:   By signing my name below, I, Wendy Christensen, attest that this documentation has been prepared under the direction and in the presence of [name], DO  11/03/2021    Patient ID: Wendy Christensen, female    DOB: 1946-08-21, 76 y.o.   MRN: [id number]  Chief Complaint  Patient presents with   Hypertension   Hyperlipidemia   Follow-up    Hypertension  Hyperlipidemia  Patient is in today for a office visit.  She complains of sinus congestion and drainage. She is using OTC Claritin and Flonase nasal spray and finds mild relief. She is interested in taking more medication to manage her symptoms. She denies having any fever at this time. She has not taken and Covid-19 test since December, 2022. Her blood pressure is elevated during this visit. She has increased stress in her daily life. She also is not resting well at night due to her husband moving frequently while sleeping. She is moving to another room to sleep. She regularly measured her blood pressure yesterday and it measured 152/78. She showed her nephrologist her recent readings and they did not change any of her blood pressure medications. She continues taking 5 mg amlodipine daily PO, 40 mg benicar daily PO, 25 mg hydrochlorothiazide daily PO, 100 mg metoprolol tartrate daily PO and reports no new issues while taking it. She denies having any swelling in her legs.  BP Readings from Last 3 Encounters:  11/03/21 (!) 160/88  05/03/21 130/80  02/18/21 137/77   Pulse Readings from Last 3 Encounters:  11/03/21 (!) 57  05/03/21 (!) 52  02/18/21 63   She recently seen her nephrologist and found there was no new issues to report. She is due for a tetanus vaccine and is interested in receiving it at her pharmacy. She is due for shingles vaccines. She is due for a bone density scan and prefers to receive it during her next mammogram appointment.    Past Medical History:  Diagnosis Date   Allergic rhinitis     Allergy    seasonal   Anxiety    Asthma, intrinsic    Cataract    bilateral repair   Depression    Frequency of urination    GERD (gastroesophageal reflux disease)    Hyperlipidemia    Hypertension    Kidney stone    Left kidney-calcium    OSA on CPAP    PT IN PROCESS OF GETTING MACHINE SET UP   Sleep apnea    cpap   Thyroid goiter    Urgency of urination    Wears glasses     Past Surgical History:  Procedure Laterality Date   BREAST BIOPSY     CATARACT EXTRACTION, BILATERAL     COLONOSCOPY  2013   CYSTOSCOPY WITH RETROGRADE PYELOGRAM, URETEROSCOPY AND STENT PLACEMENT Left 06/21/2015   Procedure: CYSTOSCOPY WITH RETROGRADE PYELOGRAM, URETEROSCOPY AND STENT PLACEMENT;  Surgeon: Nickie Retort, MD;  Location: [facility];  Service: Urology;  Laterality: Left;   DILATION AND CURETTAGE OF UTERUS  1990   W/ HYSTEROSCOPY   EYE SURGERY     retina both eyes 6/19   HOLMIUM LASER APPLICATION Left 53/05/7672   Procedure: HOLMIUM LASER APPLICATION;  Surgeon: Nickie Retort, MD;  Location: [facility];  Service: Urology;  Laterality: Left;   STONE EXTRACTION WITH BASKET Left 06/21/2015   Procedure: STONE EXTRACTION WITH BASKET;  Surgeon: Nickie Retort, MD;  Location: [facility];  Service: Urology;  Laterality: Left;   TUBAL LIGATION  73's    Family History  Problem Relation Age of Onset   Hypertension Mother    Heart disease Mother    Stroke Mother    Heart attack Mother    COPD Father    Asthma Father    Stroke Brother    Hypertension Brother    Hypertension Brother    Kidney cancer Maternal Aunt    Heart attack Maternal Aunt    Breast cancer Cousin    Breast cancer Cousin    Hyperlipidemia Other    Hypertension Other    Colon cancer Neg Hx    Colon polyps Neg Hx    Esophageal cancer Neg Hx    Rectal cancer Neg Hx    Stomach cancer Neg Hx     Social History   Socioeconomic History   Marital status:  Married    Spouse name: Not on file   Number of children: Not on file   Years of education: Not on file   Highest education level: Not on file  Occupational History   Occupation: Retired    Fish farm manager: DISABLE  Tobacco Use   Smoking status: Never   Smokeless tobacco: Never  Vaping Use   Vaping Use: Never used  Substance and Sexual Activity   Alcohol use: Not Currently    Alcohol/week: 0.0 standard drinks   Drug use: No   Sexual activity: Yes    Partners: Male    Comment: Husband   Other Topics Concern   Not on file  Social History Narrative   Exercise--babysitting---   Social Determinants of Health   Financial Resource Strain: Not on file  Food Insecurity: Not on file  Transportation Needs: No Transportation Needs   Lack of Transportation (Medical): No   Lack of Transportation (Non-Medical): No  Physical Activity: Insufficiently Active   Days of Exercise per Week: 4 days   Minutes of Exercise per Session: 10 min  Stress: Not on file  Social Connections: Not on file  Intimate Partner Violence: Not on file    Outpatient Medications Prior to Visit  Medication Sig Dispense Refill   albuterol (PROAIR HFA) 108 (90 Base) MCG/ACT inhaler INHALE 2 PUFFS INTO THE LUNGS EVERY 4 HOURS AS NEEDED FOR WHEEZING 8.5 g 3   ALPRAZolam (XANAX) 0.25 MG tablet Take 1 tablet (0.25 mg total) by mouth 2 (two) times daily as needed for anxiety. 20 tablet 0   amLODipine (NORVASC) 5 MG tablet TAKE 1 TABLET BY MOUTH  DAILY 90 tablet 1   citalopram (CELEXA) 40 MG tablet Take 1 tablet (40 mg total) by mouth every morning. 90 tablet 3   fenofibrate 160 MG tablet Take 1 tablet (160 mg total) by mouth daily. 90 tablet 3   fluticasone (FLONASE) 50 MCG/ACT nasal spray Place 2 sprays into both nostrils as needed. 16 g 3   fluticasone furoate-vilanterol (BREO ELLIPTA) 200-25 MCG/INH AEPB Inhale 1 puff into the lungs daily. Per Dr. Donneta Romberg 3 each 3   hydrochlorothiazide (HYDRODIURIL) 25 MG tablet Take 1  tablet (25 mg total) by mouth daily as needed. 90 tablet 1   loratadine (CLARITIN) 10 MG tablet Take 1 tablet (10 mg total) by mouth daily. 90 tablet 3   metoprolol tartrate (LOPRESSOR) 100 MG tablet Take 1 tablet (100 mg total) by mouth 2 (two) times daily. 180 tablet 3   montelukast (SINGULAIR) 10 MG tablet Take 1 tablet (10 mg total) by mouth  at bedtime. 90 tablet 1   NONFORMULARY OR COMPOUNDED ITEM Please dispense a blood pressure cuff to patient if approved through insurance. 1 each 1   nystatin cream (MYCOSTATIN) Apply 1 application topically 2 (two) times daily. 30 g 0   olmesartan (BENICAR) 40 MG tablet Take 1 tablet (40 mg total) by mouth daily. 90 tablet 3   omeprazole (PRILOSEC) 20 MG capsule Take 1 capsule (20 mg total) by mouth daily. 90 capsule 3   Propylene Glycol-Glycerin (MOISTURE EYES OP) Apply to eye as needed.     rosuvastatin (CRESTOR) 40 MG tablet Take 1 tablet (40 mg total) by mouth daily. 90 tablet 1   vitamin B-12 (CYANOCOBALAMIN) 1000 MCG tablet Take 1,000 mcg by mouth daily.     COVID-19 mRNA bivalent vaccine, Pfizer, (PFIZER COVID-19 VAC BIVALENT) injection Inject into the muscle. 0.3 mL 0   amoxicillin-clavulanate (AUGMENTIN) 875-125 MG tablet Take 1 tablet by mouth 2 (two) times daily. (Patient not taking: Reported on 11/03/2021) 20 tablet 0   No facility-administered medications prior to visit.    No Known Allergies  Review of Systems  Constitutional:  Negative for fever.  HENT:  Positive for congestion.        (+)rhinorrhea  Cardiovascular:  Negative for leg swelling.      Objective:    Physical Exam Constitutional:      General: She is not in acute distress.    Appearance: Normal appearance. She is not ill-appearing.  HENT:     Head: Normocephalic and atraumatic.     Right Ear: External ear normal.     Left Ear: External ear normal.  Eyes:     Extraocular Movements: Extraocular movements intact.     Pupils: Pupils are equal, round, and reactive to  light.  Cardiovascular:     Rate and Rhythm: Normal rate and regular rhythm.     Heart sounds: Normal heart sounds. No murmur heard.   No gallop.  Pulmonary:     Effort: Pulmonary effort is normal. No respiratory distress.     Breath sounds: Normal breath sounds. No wheezing or rales.  Skin:    General: Skin is warm and dry.  Neurological:     Mental Status: She is alert and oriented to person, place, and time.  Psychiatric:        Behavior: Behavior normal.        Judgment: Judgment normal.    BP (!) 160/88 (BP Location: Left Arm, Patient Position: Sitting, Cuff Size: Large)    Pulse (!) 57    Temp 98.7 F (37.1 C) (Oral)    Resp 18    Ht 5\' 5"  (1.651 m)    Wt 187 lb 9.6 oz (85.1 kg)    SpO2 98%    BMI 31.22 kg/m  Wt Readings from Last 3 Encounters:  11/03/21 187 lb 9.6 oz (85.1 kg)  05/03/21 183 lb 12.8 oz (83.4 kg)  02/18/21 181 lb (82.1 kg)    Diabetic Foot Exam - Simple   No data filed    Lab Results  Component Value Date   WBC 5.8 05/03/2021   HGB 11.0 (L) 05/03/2021   HCT 33.9 (L) 05/03/2021   PLT 268.0 05/03/2021   GLUCOSE 82 05/03/2021   CHOL 254 (H) 05/03/2021   TRIG 131.0 05/03/2021   HDL 55.80 05/03/2021   LDLDIRECT 215.7 10/18/2011   LDLCALC 172 (H) 05/03/2021   ALT 11 05/03/2021   AST 17 05/03/2021   NA 138 05/03/2021  K 4.5 05/03/2021   CL 102 05/03/2021   CREATININE 1.23 (H) 05/03/2021   BUN 19 05/03/2021   CO2 31 05/03/2021   TSH 1.01 05/03/2021   HGBA1C 6.4 09/14/2014   MICROALBUR <0.7 03/23/2015    Lab Results  Component Value Date   TSH 1.01 05/03/2021   Lab Results  Component Value Date   WBC 5.8 05/03/2021   HGB 11.0 (L) 05/03/2021   HCT 33.9 (L) 05/03/2021   MCV 83.9 05/03/2021   PLT 268.0 05/03/2021   Lab Results  Component Value Date   NA 138 05/03/2021   K 4.5 05/03/2021   CO2 31 05/03/2021   GLUCOSE 82 05/03/2021   BUN 19 05/03/2021   CREATININE 1.23 (H) 05/03/2021   BILITOT 0.4 05/03/2021   ALKPHOS 54 05/03/2021    AST 17 05/03/2021   ALT 11 05/03/2021   PROT 7.3 05/03/2021   ALBUMIN 3.9 05/03/2021   CALCIUM 9.4 05/03/2021   ANIONGAP 9 02/24/2015   GFR 43.61 (L) 05/03/2021   Lab Results  Component Value Date   CHOL 254 (H) 05/03/2021   Lab Results  Component Value Date   HDL 55.80 05/03/2021   Lab Results  Component Value Date   LDLCALC 172 (H) 05/03/2021   Lab Results  Component Value Date   TRIG 131.0 05/03/2021   Lab Results  Component Value Date   CHOLHDL 5 05/03/2021   Lab Results  Component Value Date   HGBA1C 6.4 09/14/2014       Assessment & Plan:   Problem List Items Addressed This Visit       Cardiovascular and Mediastinum   Essential hypertension     Other   Hyperlipidemia LDL goal <100     No orders of the defined types were placed in this encounter.   I, [name], DO, personally preformed the services described in this documentation.  All medical record entries made by the scribe were at my direction and in my presence.  I have reviewed the chart and discharge instructions (if applicable) and agree that the record reflects my personal performance and is accurate and complete. 11/03/2021   I,Wendy Christensen,acting as a scribe for [name], DO.,have documented all relevant documentation on the behalf of [name], DO,as directed by  [name], DO while in the presence of [name], DO.   Wendy [employer]

## 2021-11-03 NOTE — Patient Instructions (Signed)

## 2021-11-07 ENCOUNTER — Other Ambulatory Visit: Payer: Self-pay

## 2021-11-07 ENCOUNTER — Ambulatory Visit
Admission: EM | Admit: 2021-11-07 | Discharge: 2021-11-07 | Disposition: A | Discharge status: Home / Self Care | Payer: Medicare Other | Attending: Emergency Medicine | Admitting: Emergency Medicine

## 2021-11-07 DIAGNOSIS — J0140 Acute pansinusitis, unspecified: Secondary | ICD-10-CM

## 2021-11-07 DIAGNOSIS — J4541 Moderate persistent asthma with (acute) exacerbation: Secondary | ICD-10-CM | POA: Diagnosis not present

## 2021-11-07 MED ORDER — METHYLPREDNISOLONE SODIUM SUCC 125 MG IJ SOLR
80.0000 mg | Freq: Once | INTRAMUSCULAR | Status: AC
  Administered 2021-11-07: 80 mg via INTRAMUSCULAR

## 2021-11-07 MED ORDER — PROMETHAZINE-DM 6.25-15 MG/5ML PO SYRP: 5.0000 mL | ORAL_SOLUTION | Freq: Four times a day (QID) | ORAL | 0 refills | Status: DC | PRN

## 2021-11-07 MED ORDER — METHYLPREDNISOLONE 4 MG PO TBPK: ORAL_TABLET | ORAL | 0 refills | Status: DC

## 2021-11-07 MED ORDER — DOXYCYCLINE HYCLATE 100 MG PO CAPS: 100.0000 mg | ORAL_CAPSULE | Freq: Two times a day (BID) | ORAL | 0 refills | Status: AC

## 2021-11-07 MED ORDER — IPRATROPIUM BROMIDE 0.06 % NA SOLN: 2.0000 | Freq: Four times a day (QID) | NASAL | 0 refills | Status: AC

## 2021-11-07 NOTE — Discharge Instructions (Addendum)
Your symptoms and physical exam findings are concerning for a bacterial infection in your sinuses that is irritating your throat as well.  I am also concerned that your allergies and asthma have flared up as well.  I am glad that you are currently using Breo every day.  I encourage you to consider using your albuterol 4 times daily while you are getting through this rough patch.  Be sure that you are also using her Flonase and taking Claritin every day.   For your bacterial infection in your sinuses, I provided you with a prescription for doxycycline 100 mg to take twice daily for a full 30 days.  As we discussed, after you have completed 14 days of doxycycline, please consider discontinuing once your symptoms have completely resolved.  Please be sure that you at least take a full 14-day course.  Please be sure to avoid taking this medication within 2 hours of eating anything that contains calcium such as milk, cheese, yogurt, ice cream, cream in your coffee.  Please see the list below for recommended medications, dosages and frequencies to provide relief of your current symptoms:     Methylprednisolone IM (Solu-Medrol):  To quickly address your significant respiratory inflammation, you were provided with an injection of methylprednisolone in the office today.  You should continue to feel the full benefit of the steroid for the next 4 to 6 hours.    Methylprednisolone (Medrol Dosepak): This is a steroid that will significantly calm your upper and lower airways, please take one row of tablets daily with your breakfast meal starting tomorrow morning until the prescription is complete.  There is a prescription waiting for you at your pharmacy.   Albuterol HFA: This is a bronchodilator, it relaxes the smooth muscles that constrict your airway in your lungs when you are feeling sick or having inflammation secondary to allergies or upper respiratory infection.  Please inhale 2 puffs twice daily every day using  the spacer provided.  You can also inhale 2 more puffs as often as needed throughout the day for aggravating cough, chest tightness, feeling short of breath, wheezing.      Fluticasone (Flonase): This is a steroid nasal spray that you use once daily, 1 spray in each nare.  After 3 to 5 days of use, you will have significant improvement of the inflammation and mucus production that is being caused by exposure to allergens.     Loratadine (10): This is an excellent second-generation antihistamine that helps to reduce respiratory inflammatory response to viruses and environmental allergens.  Please take 1 tablet daily at bedtime.   Ipratropium (Atrovent): This is an excellent nasal decongestant spray that does not cause rebound congestion, can be used up to 4 times daily as needed, instill 2 sprays into each nare with each use.  I have provided you with a prescription for this medication.      Promethazine DM: Promethazine is both a nasal decongestant and an antinausea medication that makes most patients feel fairly sleepy.  The DM is dextromethorphan, a cough suppressant found in many over-the-counter cough medications.  Please take 5 mL before bedtime to minimize your cough which will help you sleep better.  I have provided you with a prescription for this medication.      Conservative care is also recommended at this time.  This includes rest, pushing clear fluids and activity as tolerated.  Warm beverages such as teas and broths versus cold beverages/popsicles and frozen sherbet/sorbet are your choice, both  warm and cold are beneficial.  You may also notice that your appetite is reduced; this is okay as long as you are drinking plenty of clear fluids.    Please follow-up within the next 3 to 5 days either with your primary care provider or urgent care if your symptoms do not resolve.  If you do not have a primary care provider, we will assist you in finding one.   Thank you for visiting urgent care  today.  We appreciate the opportunity to participate in your care.

## 2021-11-07 NOTE — ED Provider Notes (Signed)
[facility]    CSN: [id number] Arrival date & time: 11/07/21  0818    HISTORY   Chief Complaint  Patient presents with   URI    Pt c/o sinus pressure, coughing and congestion x 2 days.    HPI Wendy Christensen is a 76 y.o. female. Patient reports a 2-week history of gradually postnasal drip, states she was seen by her primary care provider 5 days ago for a routine well visit, at the time mentioned the increased drainage and her provider gave her a prescription for azelastine.  Patient states that she stopped using her Flonase and begin using azelastine but does not believe it was helping at all.  Patient states that 2 days ago, she began to have significant sinus pressure and pain, states she feels that her throat is sore and is not sure whether it is the azelastine or the postnasal drip.  Patient states the postnasal drip and sore throat has caused her to cough more and she feels that her asthma is flared up.  Patient states she currently uses Breo daily and albuterol as needed.  Patient states she did use her albuterol inhaler this morning with some relief but continues to have cough.  Patient states she has not slept well for the past few nights due to sinus pressure and coughing.  Patient denies fever, aches, chills, nausea, vomiting, diarrhea, loss of smell or taste.  Patient states she does not exactly have a headache, states it is more pressure around her eyes.  Patient states her sinus drainage varies from light to dark yellow, states her cough is productive of small amounts of sputum but has not inspected it for color.  The history is provided by the patient.  Past Medical History:  Diagnosis Date   Allergic rhinitis    Allergy    seasonal   Anxiety    Asthma, intrinsic    Cataract    bilateral repair   Depression    Frequency of urination    GERD (gastroesophageal reflux disease)    Hyperlipidemia    Hypertension    Kidney stone    Left kidney-calcium     OSA on CPAP    PT IN PROCESS OF GETTING MACHINE SET UP   Sleep apnea    cpap   Thyroid goiter    Urgency of urination    Wears glasses    Patient Active Problem List   Diagnosis Date Noted   History of asthma 04/13/2020   Preventative health care 04/13/2020   Urinary frequency 10/05/2019   Preop examination 07/22/2018   Tendon tear, ankle, right, initial encounter 07/16/2018   Candidiasis of vagina 04/23/2018   Uterine prolapse 04/23/2018   Mass of right axilla 04/18/2018   Chest pain 05/25/2017   History of colon polyps 05/25/2017   Hypertension 07/13/2015   Renal insufficiency 03/30/2015   Pain in the abdomen 02/24/2015   Intrinsic asthma 01/26/2015   Hyperglycemia 06/19/2013   Hypothyroidism 06/17/2013   OSA (obstructive sleep apnea) 01/14/2013   BACK PAIN, THORACIC REGION, LEFT 11/18/2010   ANXIETY DEPRESSION 05/20/2009   SKIN TAG 04/19/2009   POSTMENOPAUSAL STATUS 12/28/2008   SINUSITIS- ACUTE-NOS 09/09/2008   ANXIETY 09/01/2008   GENERALIZED ANXIETY DISORDER 06/30/2008   INSOMNIA 04/24/2008   GOITER NOS 06/04/2007   NUMBNESS, ARM 06/04/2007   WEIGHT LOSS 06/04/2007   Hyperlipidemia LDL goal <100 02/07/2007   Essential hypertension 02/07/2007   ALLERGIC RHINITIS 02/07/2007   GERD 02/07/2007   COLONOSCOPY,  HX OF 02/07/2007   DILATION AND CURETTAGE, HX OF 02/07/2007   Past Surgical History:  Procedure Laterality Date   BREAST BIOPSY     CATARACT EXTRACTION, BILATERAL     COLONOSCOPY  2013   CYSTOSCOPY WITH RETROGRADE PYELOGRAM, URETEROSCOPY AND STENT PLACEMENT Left 06/21/2015   Procedure: CYSTOSCOPY WITH RETROGRADE PYELOGRAM, URETEROSCOPY AND STENT PLACEMENT;  Surgeon: Nickie Retort, MD;  Location[facility];  Service: Urology;  Laterality: Left;   DILATION AND CURETTAGE OF UTERUS  1990   W/ HYSTEROSCOPY   EYE SURGERY     retina both eyes 6/19   HOLMIUM LASER APPLICATION Left 99/04/3381   Procedure: HOLMIUM LASER APPLICATION;  Surgeon:  Nickie Retort, MD;  Location: [facility];  Service: Urology;  Laterality: Left;   STONE EXTRACTION WITH BASKET Left 06/21/2015   Procedure: STONE EXTRACTION WITH BASKET;  Surgeon: Nickie Retort, MD;  Location[facility];  Service: Urology;  Laterality: Left;   TUBAL LIGATION  1980's   OB History   No obstetric history on file.    Home Medications    Prior to Admission medications   Medication Sig Start Date End Date Taking? Authorizing Provider  albuterol (PROAIR HFA) 108 (90 Base) MCG/ACT inhaler INHALE 2 PUFFS INTO THE LUNGS EVERY 4 HOURS AS NEEDED FOR WHEEZING 05/03/21   Carollee Herter, Alferd Apa, DO  ALPRAZolam (XANAX) 0.25 MG tablet Take 1 tablet (0.25 mg total) by mouth 2 (two) times daily as needed for anxiety. 05/03/21   Roma Schanz R, DO  amLODipine (NORVASC) 10 MG tablet Take 1 tablet (10 mg total) by mouth daily. 11/03/21   Roma Schanz R, DO  amLODipine (NORVASC) 5 MG tablet TAKE 1 TABLET BY MOUTH  DAILY 05/17/21   Carollee Herter, Alferd Apa, DO  amoxicillin-clavulanate (AUGMENTIN) 875-125 MG tablet Take 1 tablet by mouth 2 (two) times daily. Patient not taking: Reported on 11/03/2021 05/17/21   Roma Schanz R, DO  azelastine (ASTELIN) 0.1 % nasal spray Place 1 spray into both nostrils 2 (two) times daily. Use in each nostril as directed 11/03/21   Carollee Herter, Alferd Apa, DO  citalopram (CELEXA) 40 MG tablet Take 1 tablet (40 mg total) by mouth every morning. 05/17/21   Roma Schanz R, DO  fenofibrate 160 MG tablet Take 1 tablet (160 mg total) by mouth daily. 11/03/21   Roma Schanz R, DO  fluticasone (FLONASE) 50 MCG/ACT nasal spray Place 2 sprays into both nostrils as needed. 05/03/21   Carollee Herter, Yvonne R, DO  fluticasone furoate-vilanterol (BREO ELLIPTA) 200-25 MCG/INH AEPB Inhale 1 puff into the lungs daily. Per Dr. Donneta Romberg 05/03/21   Carollee Herter, Alferd Apa, DO  hydrochlorothiazide (HYDRODIURIL) 25 MG tablet Take 1  tablet (25 mg total) by mouth daily as needed. 05/03/21   [name], DO  loratadine (CLARITIN) 10 MG tablet Take 1 tablet (10 mg total) by mouth daily. 05/03/21   [name], DO  metoprolol tartrate (LOPRESSOR) 100 MG tablet Take 1 tablet (100 mg total) by mouth 2 (two) times daily. 05/03/21   Roma Schanz R, DO  montelukast (SINGULAIR) 10 MG tablet Take 1 tablet (10 mg total) by mouth at bedtime. 05/03/21   [name], DO  NONFORMULARY OR COMPOUNDED ITEM Please dispense a blood pressure cuff to patient if approved through insurance. 04/01/20   [name], DO  nystatin cream (MYCOSTATIN) Apply 1 application  topically 2 (two) times daily. 04/13/20   [name], DO  olmesartan (BENICAR) 40 MG tablet Take 1 tablet (40 mg total) by mouth daily. 05/17/21   [name], DO  omeprazole (PRILOSEC) 20 MG capsule Take 1 capsule (20 mg total) by mouth daily. 05/03/21   [name], DO  Propylene Glycol-Glycerin (MOISTURE EYES OP) Apply to eye as needed.    [provider]  rosuvastatin (CRESTOR) 40 MG tablet Take 1 tablet (40 mg total) by mouth daily. 05/17/21   [name], DO  vitamin B-12 (CYANOCOBALAMIN) 1000 MCG tablet Take 1,000 mcg by mouth daily.    [provider]   Family History Family History  Problem Relation Age of Onset   Hypertension Mother    Heart disease Mother    Stroke Mother    Heart attack Mother    COPD Father    Asthma Father    Stroke Brother    Hypertension Brother    Hypertension Brother    Kidney cancer Maternal Aunt    Heart attack Maternal Aunt    Breast cancer Cousin    Breast cancer Cousin    Hyperlipidemia Other    Hypertension Other    Colon cancer Neg Hx    Colon polyps Neg Hx    Esophageal cancer Neg Hx    Rectal cancer Neg Hx    Stomach cancer Neg Hx    Social History Social History   Tobacco Use   Smoking status: Never   Smokeless tobacco: Never   Vaping Use   Vaping Use: Never used  Substance Use Topics   Alcohol use: Not Currently    Alcohol/week: 0.0 standard drinks   Drug use: No   Allergies   Patient has no known allergies.  Review of Systems Review of Systems Pertinent findings noted in history of present illness.   Physical Exam Triage Vital Signs ED Triage Vitals  Enc Vitals Group     BP 07/15/21 0827 (!) 147/82     Pulse Rate 07/15/21 0827 72     Resp 07/15/21 0827 18     Temp 07/15/21 0827 98.3 F (36.8 C)     Temp Source 07/15/21 0827 Oral     SpO2 07/15/21 0827 98 %     Weight --      Height --      Head Circumference --      Peak Flow --      Pain Score 07/15/21 0826 5     Pain Loc --      Pain Edu? --      Excl. in [place]? --   No data found.  Updated Vital Signs BP 134/82 (BP Location: Left Arm)    Pulse 96    Temp 98.7 F (37.1 C) (Oral)    Resp 16    SpO2 100%   Physical Exam Vitals and nursing note reviewed.  Constitutional:      General: She is awake. She is not in acute distress.    Appearance: Normal appearance. She is well-developed and well-groomed. She is ill-appearing.  HENT:     Head: Normocephalic and atraumatic. No raccoon eyes, right periorbital erythema or left periorbital erythema.     Salivary Glands: Right salivary gland is not diffusely enlarged or tender. Left salivary gland is not diffusely enlarged or tender.     Right Ear: Hearing, tympanic membrane, ear canal and external ear normal. No drainage. No middle ear  effusion. There is no impacted cerumen. Tympanic membrane is not erythematous or bulging.     Left Ear: Hearing, tympanic membrane, ear canal and external ear normal. No drainage.  No middle ear effusion. There is no impacted cerumen. Tympanic membrane is not erythematous or bulging.     Nose: Mucosal edema, congestion and rhinorrhea present. No nasal deformity or septal deviation. Rhinorrhea is clear.     Right Turbinates: Enlarged, swollen and pale.     Left  Turbinates: Enlarged, swollen and pale.     Right Sinus: Maxillary sinus tenderness and frontal sinus tenderness present.     Left Sinus: Maxillary sinus tenderness and frontal sinus tenderness present.     Mouth/Throat:     Lips: Pink. No lesions.     Mouth: Mucous membranes are moist. No oral lesions.     Pharynx: Oropharynx is clear. Uvula midline. No posterior oropharyngeal erythema or uvula swelling.     Tonsils: No tonsillar exudate. 0 on the right. 0 on the left.  Eyes:     General: Lids are normal.        Right eye: No discharge.        Left eye: No discharge.     Extraocular Movements: Extraocular movements intact.     Conjunctiva/sclera: Conjunctivae normal.     Right eye: Right conjunctiva is not injected.     Left eye: Left conjunctiva is not injected.     Pupils: Pupils are equal, round, and reactive to light.  Neck:     Trachea: Trachea and phonation normal.  Cardiovascular:     Rate and Rhythm: Normal rate and regular rhythm.     Pulses: Normal pulses.     Heart sounds: Normal heart sounds. No murmur heard.   No friction rub. No gallop.  Pulmonary:     Effort: Pulmonary effort is normal. No tachypnea, bradypnea, accessory muscle usage, prolonged expiration, respiratory distress or retractions.     Breath sounds: No stridor, decreased air movement or transmitted upper airway sounds. Examination of the right-upper field reveals wheezing. Examination of the left-upper field reveals wheezing. Examination of the right-lower field reveals decreased breath sounds. Examination of the left-lower field reveals decreased breath sounds. Decreased breath sounds and wheezing present. No rhonchi or rales.  Chest:     Chest wall: No tenderness.  Musculoskeletal:        General: Normal range of motion.     Cervical back: Normal range of motion and neck supple. Normal range of motion.  Lymphadenopathy:     Cervical: No cervical adenopathy.  Skin:    General: Skin is warm and dry.      Findings: No erythema or rash.  Neurological:     General: No focal deficit present.     Mental Status: She is alert and oriented to person, place, and time.  Psychiatric:        Mood and Affect: Mood normal.        Behavior: Behavior normal. Behavior is cooperative.    Visual Acuity Right Eye Distance:   Left Eye Distance:   Bilateral Distance:    Right Eye Near:   Left Eye Near:    Bilateral Near:     UC Couse / Diagnostics / Procedures:    EKG  Radiology No results found.  Procedures Procedures (including critical care time)  UC Diagnoses / Final Clinical Impressions(s)   I have reviewed the triage vital signs and the nursing notes.  Pertinent labs & imaging results  that were available during my care of the patient were reviewed by me and considered in my medical decision making (see chart for details).   Final diagnoses:  Acute non-recurrent pansinusitis  Moderate persistent asthma with (acute) exacerbation   Likely bacterial pansinusitis with mild exacerbation of moderate persistent asthma.  Patient provided with an injection of methylprednisolone and a steroid Dosepak.  Patient also advised to begin doxycycline for 30 days, advised she can discontinue anytime after 14 days if symptoms are completely resolved.  Patient advised to continue all allergy medications, resume Flonase, have added Atrovent.  Patient provided with a short course of Promethazine DM for sleep at night.  Patient warned of side effect of somnolence with this cough medicine.  Return precautions advised. ED Prescriptions     Medication Sig Dispense Auth. Provider   ipratropium (ATROVENT) 0.06 % nasal spray Place 2 sprays into both nostrils 4 (four) times daily. As needed for nasal congestion, runny nose 15 mL Lynden Oxford Scales, PA-C   promethazine-dextromethorphan (PROMETHAZINE-DM) 6.25-15 MG/5ML syrup Take 5 mLs by mouth 4 (four) times daily as needed for cough. 118 mL Lynden Oxford Scales,  PA-C   doxycycline (VIBRAMYCIN) 100 MG capsule Take 1 capsule (100 mg total) by mouth 2 (two) times daily. 60 capsule Lynden Oxford Scales, PA-C   methylPREDNISolone (MEDROL DOSEPAK) 4 MG TBPK tablet Take 24 mg on day 1, 20 mg on day 2, 16 mg on day 3, 12 mg on day 4, 8 mg on day 5, 4 mg on day 6. 21 tablet Lynden Oxford Scales, PA-C      PDMP not reviewed this encounter.  Pending results:  Labs Reviewed - No data to display  Medications Ordered in UC: Medications  methylPREDNISolone sodium succinate (SOLU-MEDROL) 125 mg/2 mL injection 80 mg (80 mg Intramuscular Given 11/07/21 0923)    Disposition Upon Discharge:  Condition: stable for discharge home Home: take medications as prescribed; routine discharge instructions as discussed; follow up as advised.  Patient presented with an acute illness with associated systemic symptoms and significant discomfort requiring urgent management. In my opinion, this is a condition that a prudent lay person (someone who possesses an average knowledge of health and medicine) may potentially expect to result in complications if not addressed urgently such as respiratory distress, impairment of bodily function or dysfunction of bodily organs.   Routine symptom specific, illness specific and/or disease specific instructions were discussed with the patient and/or caregiver at length.   As such, the patient has been evaluated and assessed, work-up was performed and treatment was provided in alignment with urgent care protocols and evidence based medicine.  Patient/parent/caregiver has been advised that the patient may require follow up for further testing and treatment if the symptoms continue in spite of treatment, as clinically indicated and appropriate.  If the patient was tested for COVID-19, Influenza and/or RSV, then the patient/parent/guardian was advised to isolate at home pending the results of his/her diagnostic coronavirus test and potentially  longer if theyre positive. I have also advised pt that if his/her COVID-19 test returns positive, it's recommended to self-isolate for at least 10 days after symptoms first appeared AND until fever-free for 24 hours without fever reducer AND other symptoms have improved or resolved. Discussed self-isolation recommendations as well as instructions for household member/close contacts as per the [facility] and [place] DHHS, and also gave patient the [place] packet with this information.  Patient/parent/caregiver has been advised to return to the [facility] or PCP in 3-5 days if  no better; to PCP or the Emergency Department if new signs and symptoms develop, or if the current signs or symptoms continue to change or worsen for further workup, evaluation and treatment as clinically indicated and appropriate  The patient will follow up with their current PCP if and as advised. If the patient does not currently have a PCP we will assist them in obtaining one.   The patient may need specialty follow up if the symptoms continue, in spite of conservative treatment and management, for further workup, evaluation, consultation and treatment as clinically indicated and appropriate.  Patient/parent/caregiver verbalized understanding and agreement of plan as discussed.  All questions were addressed during visit.  Please see discharge instructions below for further details of plan.  Discharge Instructions:   Discharge Instructions      Your symptoms and physical exam findings are concerning for a bacterial infection in your sinuses that is irritating your throat as well.  I am also concerned that your allergies and asthma have flared up as well.  I am glad that you are currently using Breo every day.  I encourage you to consider using your albuterol 4 times daily while you are getting through this rough patch.  Be sure that you are also using her Flonase and taking Claritin every day.   For your bacterial infection in your sinuses, I  provided you with a prescription for doxycycline 100 mg to take twice daily for a full 30 days.  As we discussed, after you have completed 14 days of doxycycline, please consider discontinuing once your symptoms have completely resolved.  Please be sure that you at least take a full 14-day course.  Please be sure to avoid taking this medication within 2 hours of eating anything that contains calcium such as milk, cheese, yogurt, ice cream, cream in your coffee.  Please see the list below for recommended medications, dosages and frequencies to provide relief of your current symptoms:     Methylprednisolone IM (Solu-Medrol):  To quickly address your significant respiratory inflammation, you were provided with an injection of methylprednisolone in the office today.  You should continue to feel the full benefit of the steroid for the next 4 to 6 hours.    Methylprednisolone (Medrol Dosepak): This is a steroid that will significantly calm your upper and lower airways, please take one row of tablets daily with your breakfast meal starting tomorrow morning until the prescription is complete.  There is a prescription waiting for you at your pharmacy.   Albuterol HFA: This is a bronchodilator, it relaxes the smooth muscles that constrict your airway in your lungs when you are feeling sick or having inflammation secondary to allergies or upper respiratory infection.  Please inhale 2 puffs twice daily every day using the spacer provided.  You can also inhale 2 more puffs as often as needed throughout the day for aggravating cough, chest tightness, feeling short of breath, wheezing.      Fluticasone (Flonase): This is a steroid nasal spray that you use once daily, 1 spray in each nare.  After 3 to 5 days of use, you will have significant improvement of the inflammation and mucus production that is being caused by exposure to allergens.     Loratadine (10): This is an excellent second-generation antihistamine that  helps to reduce respiratory inflammatory response to viruses and environmental allergens.  Please take 1 tablet daily at bedtime.   Ipratropium (Atrovent): This is an excellent nasal decongestant spray that does not cause  rebound congestion, can be used up to 4 times daily as needed, instill 2 sprays into each nare with each use.  I have provided you with a prescription for this medication.      Promethazine DM: Promethazine is both a nasal decongestant and an antinausea medication that makes most patients feel fairly sleepy.  The DM is dextromethorphan, a cough suppressant found in many over-the-counter cough medications.  Please take 5 mL before bedtime to minimize your cough which will help you sleep better.  I have provided you with a prescription for this medication.      Conservative care is also recommended at this time.  This includes rest, pushing clear fluids and activity as tolerated.  Warm beverages such as teas and broths versus cold beverages/popsicles and frozen sherbet/sorbet are your choice, both warm and cold are beneficial.  You may also notice that your appetite is reduced; this is okay as long as you are drinking plenty of clear fluids.    Please follow-up within the next 3 to 5 days either with your primary care provider or urgent care if your symptoms do not resolve.  If you do not have a primary care provider, we will assist you in finding one.   Thank you for visiting urgent care today.  We appreciate the opportunity to participate in your care.      This office note has been dictated using Museum/gallery curator.  Unfortunately, and despite my best efforts, this method of dictation can sometimes lead to occasional typographical or grammatical errors.  I apologize in advance if this occurs.     Lynden Oxford Scales, [place] 11/07/21 [phone]

## 2021-11-07 NOTE — ED Triage Notes (Signed)
Pt reports sinus pressure with cough and congestion x 2 days.

## 2021-11-15 ENCOUNTER — Ambulatory Visit (INDEPENDENT_AMBULATORY_CARE_PROVIDER_SITE_OTHER): Payer: Medicare Other | Admitting: Family Medicine

## 2021-11-15 ENCOUNTER — Other Ambulatory Visit: Payer: Self-pay | Admitting: Family Medicine

## 2021-11-15 ENCOUNTER — Encounter: Payer: Self-pay | Admitting: Family Medicine

## 2021-11-15 VITALS — BP 128/78 | HR 64 | Temp 98.0°F | Resp 20 | Ht 65.0 in | Wt 185.4 lb

## 2021-11-15 DIAGNOSIS — T7840XA Allergy, unspecified, initial encounter: Secondary | ICD-10-CM

## 2021-11-15 DIAGNOSIS — J0140 Acute pansinusitis, unspecified: Secondary | ICD-10-CM | POA: Diagnosis not present

## 2021-11-15 MED ORDER — PREDNISONE 10 MG PO TABS: ORAL_TABLET | ORAL | 0 refills | Status: DC

## 2021-11-15 MED ORDER — LEVOFLOXACIN 500 MG PO TABS: 500.0000 mg | ORAL_TABLET | Freq: Every day | ORAL | 0 refills | Status: AC

## 2021-11-15 NOTE — Patient Instructions (Signed)

## 2021-11-15 NOTE — Progress Notes (Signed)
Established Patient Office Visit  Subjective:  Patient ID: Wendy Christensen, female    DOB: 1946/05/06  Age: 76 y.o. MRN: [id number]  CC:    HPI Wendy Christensen presents for sinus symptoms and cough with exacerbation of her asthma--- she is using the albuterol more often.   + low grade fever   + cough with yellow mucus and sinus pressure and headache .       Past Medical History:  Diagnosis Date   Allergic rhinitis    Allergy    seasonal   Anxiety    Asthma, intrinsic    Cataract    bilateral repair   Depression    Frequency of urination    GERD (gastroesophageal reflux disease)    Hyperlipidemia    Hypertension    Kidney stone    Left kidney-calcium    OSA on CPAP    PT IN PROCESS OF GETTING MACHINE SET UP   Sleep apnea    cpap   Thyroid goiter    Urgency of urination    Wears glasses     Past Surgical History:  Procedure Laterality Date   BREAST BIOPSY     CATARACT EXTRACTION, BILATERAL     COLONOSCOPY  2013   CYSTOSCOPY WITH RETROGRADE PYELOGRAM, URETEROSCOPY AND STENT PLACEMENT Left 06/21/2015   Procedure: CYSTOSCOPY WITH RETROGRADE PYELOGRAM, URETEROSCOPY AND STENT PLACEMENT;  Surgeon: Nickie Retort, MD;  Location: [facility];  Service: Urology;  Laterality: Left;   DILATION AND CURETTAGE OF UTERUS  1990   W/ HYSTEROSCOPY   EYE SURGERY     retina both eyes 6/19   HOLMIUM LASER APPLICATION Left 18/10/9935   Procedure: HOLMIUM LASER APPLICATION;  Surgeon: Nickie Retort, MD;  Location: [facility];  Service: Urology;  Laterality: Left;   STONE EXTRACTION WITH BASKET Left 06/21/2015   Procedure: STONE EXTRACTION WITH BASKET;  Surgeon: Nickie Retort, MD;  Location: [facility];  Service: Urology;  Laterality: Left;   TUBAL LIGATION  68's    Family History  Problem Relation Age of Onset   Hypertension Mother    Heart disease Mother    Stroke Mother    Heart attack Mother    COPD Father     Asthma Father    Stroke Brother    Hypertension Brother    Hypertension Brother    Kidney cancer Maternal Aunt    Heart attack Maternal Aunt    Breast cancer Cousin    Breast cancer Cousin    Hyperlipidemia Other    Hypertension Other    Colon cancer Neg Hx    Colon polyps Neg Hx    Esophageal cancer Neg Hx    Rectal cancer Neg Hx    Stomach cancer Neg Hx     Social History   Socioeconomic History   Marital status: Married    Spouse name: Not on file   Number of children: Not on file   Years of education: Not on file   Highest education level: Not on file  Occupational History   Occupation: Retired    Fish farm manager: DISABLE  Tobacco Use   Smoking status: Never   Smokeless tobacco: Never  Vaping Use   Vaping Use: Never used  Substance and Sexual Activity   Alcohol use: Not Currently    Alcohol/week: 0.0 standard drinks   Drug use: No   Sexual activity: Yes    Partners: Male    Comment: Husband  Other Topics Concern   Not on file  Social History Narrative   Exercise--babysitting---   Social Determinants of Health   Financial Resource Strain: Not on file  Food Insecurity: Not on file  Transportation Needs: No Transportation Needs   Lack of Transportation (Medical): No   Lack of Transportation (Non-Medical): No  Physical Activity: Insufficiently Active   Days of Exercise per Week: 4 days   Minutes of Exercise per Session: 10 min  Stress: Not on file  Social Connections: Not on file  Intimate Partner Violence: Not on file    Outpatient Medications Prior to Visit  Medication Sig Dispense Refill   albuterol (PROAIR HFA) 108 (90 Base) MCG/ACT inhaler INHALE 2 PUFFS INTO THE LUNGS EVERY 4 HOURS AS NEEDED FOR WHEEZING 8.5 g 3   ALPRAZolam (XANAX) 0.25 MG tablet Take 1 tablet (0.25 mg total) by mouth 2 (two) times daily as needed for anxiety. 20 tablet 0   amLODipine (NORVASC) 10 MG tablet Take 1 tablet (10 mg total) by mouth daily. 90 tablet 1   amLODipine  (NORVASC) 5 MG tablet TAKE 1 TABLET BY MOUTH  DAILY 90 tablet 1   citalopram (CELEXA) 40 MG tablet Take 1 tablet (40 mg total) by mouth every morning. 90 tablet 3   doxycycline (VIBRAMYCIN) 100 MG capsule Take 1 capsule (100 mg total) by mouth 2 (two) times daily. 60 capsule 0   fenofibrate 160 MG tablet Take 1 tablet (160 mg total) by mouth daily. 90 tablet 3   fluticasone (FLONASE) 50 MCG/ACT nasal spray Place 2 sprays into both nostrils as needed. 16 g 3   fluticasone furoate-vilanterol (BREO ELLIPTA) 200-25 MCG/INH AEPB Inhale 1 puff into the lungs daily. Per Dr. Donneta Romberg 3 each 3   hydrochlorothiazide (HYDRODIURIL) 25 MG tablet Take 1 tablet (25 mg total) by mouth daily as needed. 90 tablet 1   ipratropium (ATROVENT) 0.06 % nasal spray Place 2 sprays into both nostrils 4 (four) times daily. As needed for nasal congestion, runny nose 15 mL 0   loratadine (CLARITIN) 10 MG tablet Take 1 tablet (10 mg total) by mouth daily. 90 tablet 3   metoprolol tartrate (LOPRESSOR) 100 MG tablet Take 1 tablet (100 mg total) by mouth 2 (two) times daily. 180 tablet 3   montelukast (SINGULAIR) 10 MG tablet Take 1 tablet (10 mg total) by mouth at bedtime. 90 tablet 1   NONFORMULARY OR COMPOUNDED ITEM Please dispense a blood pressure cuff to patient if approved through insurance. 1 each 1   nystatin cream (MYCOSTATIN) Apply 1 application topically 2 (two) times daily. 30 g 0   olmesartan (BENICAR) 40 MG tablet Take 1 tablet (40 mg total) by mouth daily. 90 tablet 3   omeprazole (PRILOSEC) 20 MG capsule Take 1 capsule (20 mg total) by mouth daily. 90 capsule 3   promethazine-dextromethorphan (PROMETHAZINE-DM) 6.25-15 MG/5ML syrup Take 5 mLs by mouth 4 (four) times daily as needed for cough. 118 mL 0   Propylene Glycol-Glycerin (MOISTURE EYES OP) Apply to eye as needed.     rosuvastatin (CRESTOR) 40 MG tablet Take 1 tablet (40 mg total) by mouth daily. 90 tablet 1   vitamin B-12 (CYANOCOBALAMIN) 1000 MCG tablet Take  1,000 mcg by mouth daily.     methylPREDNISolone (MEDROL DOSEPAK) 4 MG TBPK tablet Take 24 mg on day 1, 20 mg on day 2, 16 mg on day 3, 12 mg on day 4, 8 mg on day 5, 4 mg on day 6. (Patient not  taking: Reported on 11/15/2021) 21 tablet 0   No facility-administered medications prior to visit.    No Known Allergies  ROS Review of Systems  Constitutional:  Negative for chills and fever.  HENT:  Positive for congestion, postnasal drip, rhinorrhea, sinus pressure and sore throat.   Respiratory:  Positive for cough and chest tightness. Negative for shortness of breath and wheezing.   Cardiovascular:  Negative for chest pain, palpitations and leg swelling.  Allergic/Immunologic: Negative for environmental allergies.     Objective:    Physical Exam Vitals and nursing note reviewed.  Constitutional:      Appearance: She is well-developed.  HENT:     Right Ear: External ear normal.     Left Ear: External ear normal.     Nose: Congestion present.     Right Sinus: Maxillary sinus tenderness present.     Left Sinus: Maxillary sinus tenderness present.     Mouth/Throat:     Pharynx: Posterior oropharyngeal erythema present.     Tonsils: 1+ on the right. 1+ on the left.  Eyes:     General:        Right eye: No discharge.        Left eye: No discharge.     Conjunctiva/sclera: Conjunctivae normal.  Cardiovascular:     Rate and Rhythm: Normal rate and regular rhythm.     Heart sounds: Normal heart sounds. No murmur heard. Pulmonary:     Effort: Pulmonary effort is normal. No respiratory distress.     Breath sounds: Normal breath sounds. No wheezing or rales.  Chest:     Chest wall: No tenderness.  Musculoskeletal:     Cervical back: Neck supple.  Lymphadenopathy:     Cervical: Cervical adenopathy present.  Neurological:     Mental Status: She is alert and oriented to person, place, and time.    BP 128/78 (BP Location: Left Arm, Patient Position: Sitting, Cuff Size: Large)    Pulse  64    Temp 98 F (36.7 C) (Oral)    Resp 20    Ht 5\' 5"  (1.651 m)    Wt 185 lb 6.4 oz (84.1 kg)    SpO2 99%    BMI 30.85 kg/m  Wt Readings from Last 3 Encounters:  11/15/21 185 lb 6.4 oz (84.1 kg)  11/03/21 187 lb 9.6 oz (85.1 kg)  05/03/21 183 lb 12.8 oz (83.4 kg)     Health Maintenance Due  Topic Date Due   Zoster Vaccines- Shingrix (1 of 2) Never done   DEXA SCAN  01/22/2011   TETANUS/TDAP  04/19/2015    There are no preventive care reminders to display for this patient.  Lab Results  Component Value Date   TSH 1.01 05/03/2021   Lab Results  Component Value Date   WBC 5.5 11/03/2021   HGB 11.4 (L) 11/03/2021   HCT 35.4 (L) 11/03/2021   MCV 84.9 11/03/2021   PLT 266.0 11/03/2021   Lab Results  Component Value Date   NA 136 11/03/2021   K 3.5 11/03/2021   CO2 35 (H) 11/03/2021   GLUCOSE 92 11/03/2021   BUN 23 11/03/2021   CREATININE 1.17 11/03/2021   BILITOT 0.6 11/03/2021   ALKPHOS 49 11/03/2021   AST 19 11/03/2021   ALT 14 11/03/2021   PROT 7.4 11/03/2021   ALBUMIN 4.3 11/03/2021   CALCIUM 9.4 11/03/2021   ANIONGAP 9 02/24/2015   GFR 46.14 (L) 11/03/2021   Lab Results  Component Value Date  CHOL 202 (H) 11/03/2021   Lab Results  Component Value Date   HDL 62.60 11/03/2021   Lab Results  Component Value Date   LDLCALC 121 (H) 11/03/2021   Lab Results  Component Value Date   TRIG 88.0 11/03/2021   Lab Results  Component Value Date   CHOLHDL 3 11/03/2021   Lab Results  Component Value Date   HGBA1C 6.4 09/14/2014      Assessment & Plan:   Problem List Items Addressed This Visit       Unprioritized   SINUSITIS- ACUTE-NOS - Primary    abx per orders pred taper  rto prn       Relevant Medications   predniSONE (DELTASONE) 10 MG tablet   levofloxacin (LEVAQUIN) 500 MG tablet    Meds ordered this encounter  Medications   predniSONE (DELTASONE) 10 MG tablet    Sig: TAKE 3 TABLETS PO QD FOR 3 DAYS THEN TAKE 2 TABLETS PO QD FOR 3  DAYS THEN TAKE 1 TABLET PO QD FOR 3 DAYS THEN TAKE 1/2 TAB PO QD FOR 3 DAYS    Dispense:  20 tablet    Refill:  0   levofloxacin (LEVAQUIN) 500 MG tablet    Sig: Take 1 tablet (500 mg total) by mouth daily for 7 days.    Dispense:  7 tablet    Refill:  0    Follow-up: Return if symptoms worsen or fail to improve.    [name], DO

## 2021-11-15 NOTE — Assessment & Plan Note (Signed)
abx per orders  pred taper  rto prn   

## 2021-12-13 ENCOUNTER — Other Ambulatory Visit: Payer: Self-pay | Admitting: Family Medicine

## 2021-12-13 DIAGNOSIS — Z8709 Personal history of other diseases of the respiratory system: Secondary | ICD-10-CM

## 2022-01-26 DIAGNOSIS — G4733 Obstructive sleep apnea (adult) (pediatric): Secondary | ICD-10-CM | POA: Diagnosis not present

## 2022-01-31 ENCOUNTER — Ambulatory Visit (INDEPENDENT_AMBULATORY_CARE_PROVIDER_SITE_OTHER): Payer: Medicare Other | Admitting: Family Medicine

## 2022-01-31 ENCOUNTER — Encounter: Payer: Self-pay | Admitting: Family Medicine

## 2022-01-31 VITALS — BP 130/68 | HR 61 | Temp 98.6°F | Resp 18 | Ht 65.0 in | Wt 188.0 lb

## 2022-01-31 DIAGNOSIS — J302 Other seasonal allergic rhinitis: Secondary | ICD-10-CM | POA: Diagnosis not present

## 2022-01-31 DIAGNOSIS — M7989 Other specified soft tissue disorders: Secondary | ICD-10-CM

## 2022-01-31 MED ORDER — LORATADINE 10 MG PO TABS: 10.0000 mg | ORAL_TABLET | Freq: Every day | ORAL | 3 refills | Status: DC

## 2022-01-31 MED ORDER — FLUTICASONE PROPIONATE 50 MCG/ACT NA SUSP: 2.0000 | NASAL | 3 refills | Status: DC | PRN

## 2022-01-31 NOTE — Assessment & Plan Note (Signed)
Elevate legs ?Compression socks ?Use hctz prn  ?

## 2022-01-31 NOTE — Patient Instructions (Addendum)
Edema  Edema is an abnormal buildup of fluids in the body tissues and under the skin. Swelling of the legs, feet, and ankles is a common symptom that becomes more likely as you get older. Swelling is also common in looser tissues, such as around the eyes. Pressing on the area may make a temporary dent in your skin (pitting edema). This fluid may also accumulate in your lungs (pulmonary edema). There are many possible causes of edema. Eating too much salt (sodium) and being on your feet or sitting for a long time can cause edema in your legs, feet, and ankles. Common causes of edema include: Certain medical conditions, such as heart failure, liver or kidney disease, and cancer. Weak leg blood vessels. An injury. Pregnancy. Medicines. Being obese. Low protein levels in the blood. Hot weather may make edema worse. Edema is usually painless. Your skin may look swollen or shiny. Follow these instructions at home: Medicines Take over-the-counter and prescription medicines only as told by your health care provider. Your health care provider may prescribe a medicine to help your body get rid of extra water (diuretic). Take this medicine if you are told to take it. Eating and drinking Eat a low-salt (low-sodium) diet to reduce fluid as told by your health care provider. Sometimes, eating less salt may reduce swelling. Depending on the cause of your swelling, you may need to limit how much fluid you drink (fluid restriction). General instructions Raise (elevate) the injured area above the level of your heart while you are sitting or lying down. Do not sit still or stand for long periods of time. Do not wear tight clothing. Do not wear garters on your upper legs. Exercise your legs to get your circulation going. This helps to move the fluid back into your blood vessels, and it may help the swelling go down. Wear compression stockings as told by your health care provider. These stockings help to prevent  blood clots and reduce swelling in your legs. It is important that these are the correct size. These stockings should be prescribed by your health care provider to prevent possible injuries. If elastic bandages or wraps are recommended, use them as told by your health care provider. Contact a health care provider if: Your edema does not get better with treatment. You have heart, liver, or kidney disease and have symptoms of edema. You have sudden and unexplained weight gain. Get help right away if: You develop shortness of breath or chest pain. You cannot breathe when you lie down. You develop pain, redness, or warmth in the swollen areas. You have heart, liver, or kidney disease and suddenly get edema. You have a fever and your symptoms suddenly get worse. These symptoms may be an emergency. Get help right away. Call 911. Do not wait to see if the symptoms will go away. Do not drive yourself to the hospital. Summary Edema is an abnormal buildup of fluids in the body tissues and under the skin. Eating too much salt (sodium)and being on your feet or sitting for a long time can cause edema in your legs, feet, and ankles. Raise (elevate) the injured area above the level of your heart while you are sitting or lying down. Follow your health care provider's instructions about diet and how much fluid you can drink. This information is not intended to replace advice given to you by your health care provider. Make sure you discuss any questions you have with your health care provider. Document Revised: 05/09/2021 Document   Reviewed: 05/09/2021 Elsevier Patient Education  2023 Elsevier Inc.  

## 2022-01-31 NOTE — Progress Notes (Signed)
? ?Subjective:  ? ?By signing my name below, I, Shehryar Baig, attest that this documentation has been prepared under the direction and in the presence of Dr. Roma Schanz, DO. 01/31/2022 ? ? ? Patient ID: Wendy Christensen, female    DOB: 09/03/1946, 76 y.o.   MRN: [id number] ? ?Chief Complaint  ?Patient presents with  ? Leg Swelling  ?  Bilateral leg swelling x1 month, no pain, no redness  ? ? ?HPI ?Patient is in today for a office visit.  ? ?She complains of leg swelling for the past month. She denies having any pain or redness in her legs. She reports her ankle swelling was worse this past weekend. She had difficulty wearing shoes over the weekend due to swelling. She started taking 25 mg hydrochlorothiazide PRN yesterday and found improvement in her symptoms. She reports [place] which contributed increased salt.  ?She is also SOB but thinks it may have been due to her asthma. She was also wheezing during her episodes of SOB. She continues using albuterol and breo ellipta inhalers to manage her asthma.  ?She continues taking 10 mg amlodipine daily PO and reports no new issues while taking it. She has taken amlodipine for a while and reports not experiencing leg swelling as a side effect while taking it.  ? ? ?Past Medical History:  ?Diagnosis Date  ? Allergic rhinitis   ? Allergy   ? seasonal  ? Anxiety   ? Asthma, intrinsic   ? Cataract   ? bilateral repair  ? Depression   ? Frequency of urination   ? GERD (gastroesophageal reflux disease)   ? Hyperlipidemia   ? Hypertension   ? Kidney stone   ? Left kidney-calcium   ? OSA on CPAP   ? PT IN PROCESS OF GETTING MACHINE SET UP  ? Sleep apnea   ? cpap  ? Thyroid goiter   ? Urgency of urination   ? Wears glasses   ? ? ?Past Surgical History:  ?Procedure Laterality Date  ? BREAST BIOPSY    ? CATARACT EXTRACTION, BILATERAL    ? COLONOSCOPY  2013  ? CYSTOSCOPY WITH RETROGRADE PYELOGRAM, URETEROSCOPY AND  STENT PLACEMENT Left 06/21/2015  ? Procedure: CYSTOSCOPY WITH RETROGRADE PYELOGRAM, URETEROSCOPY AND STENT PLACEMENT;  Surgeon: Nickie Retort, MD;  Location: [facility];  Service: Urology;  Laterality: Left;  ? DILATION AND CURETTAGE OF UTERUS  1990  ? W/ HYSTEROSCOPY  ? EYE SURGERY    ? retina both eyes 6/19  ? HOLMIUM LASER APPLICATION Left 45/11/6466  ? Procedure: HOLMIUM LASER APPLICATION;  Surgeon: Nickie Retort, MD;  Location: [facility];  Service: Urology;  Laterality: Left;  ? STONE EXTRACTION WITH BASKET Left 06/21/2015  ? Procedure: STONE EXTRACTION WITH BASKET;  Surgeon: Nickie Retort, MD;  Location: [facility];  Service: Urology;  Laterality: Left;  ? TUBAL LIGATION  1980's  ? ? ?Family History  ?Problem Relation Age of Onset  ? Hypertension Mother   ? Heart disease Mother   ? Stroke Mother   ? Heart attack Mother   ? COPD Father   ? Asthma Father   ? Stroke Brother   ? Hypertension Brother   ? Hypertension Brother   ? Kidney cancer Maternal Aunt   ? Heart attack Maternal Aunt   ? Breast cancer Cousin   ? Breast cancer Cousin   ? Hyperlipidemia Other   ?  Hypertension Other   ? Colon cancer Neg Hx   ? Colon polyps Neg Hx   ? Esophageal cancer Neg Hx   ? Rectal cancer Neg Hx   ? Stomach cancer Neg Hx   ? ? ?Social History  ? ?Socioeconomic History  ? Marital status: Married  ?  Spouse name: Not on file  ? Number of children: Not on file  ? Years of education: Not on file  ? Highest education level: Not on file  ?Occupational History  ? Occupation: Retired  ?  Employer: DISABLE  ?Tobacco Use  ? Smoking status: Never  ? Smokeless tobacco: Never  ?Vaping Use  ? Vaping Use: Never used  ?Substance and Sexual Activity  ? Alcohol use: Not Currently  ?  Alcohol/week: 0.0 standard drinks  ? Drug use: No  ? Sexual activity: Yes  ?  Partners: Male  ?  Comment: Husband   ?Other Topics Concern  ? Not on file  ?Social History Narrative  ?  Exercise--babysitting---  ? ?Social Determinants of Health  ? ?Financial Resource Strain: Not on file  ?Food Insecurity: Not on file  ?Transportation Needs: Not on file  ?Physical Activity: Not on file  ?Stress: Not on file  ?Social Connections: Not on file  ?Intimate Partner Violence: Not on file  ? ? ?Outpatient Medications Prior to Visit  ?Medication Sig Dispense Refill  ? albuterol (VENTOLIN HFA) 108 (90 Base) MCG/ACT inhaler USE 2 INHALATIONS BY MOUTH  EVERY 4 HOURS AS NEEDED FOR WHEEZING 54 g 1  ? ALPRAZolam (XANAX) 0.25 MG tablet Take 1 tablet (0.25 mg total) by mouth 2 (two) times daily as needed for anxiety. 20 tablet 0  ? amLODipine (NORVASC) 10 MG tablet Take 1 tablet (10 mg total) by mouth daily. 90 tablet 1  ? amLODipine (NORVASC) 5 MG tablet TAKE 1 TABLET BY MOUTH  DAILY 90 tablet 1  ? citalopram (CELEXA) 40 MG tablet Take 1 tablet (40 mg total) by mouth every morning. 90 tablet 3  ? fenofibrate 160 MG tablet Take 1 tablet (160 mg total) by mouth daily. 90 tablet 3  ? fluticasone furoate-vilanterol (BREO ELLIPTA) 200-25 MCG/INH AEPB Inhale 1 puff into the lungs daily. Per Dr. Donneta Romberg 3 each 3  ? hydrochlorothiazide (HYDRODIURIL) 25 MG tablet Take 1 tablet (25 mg total) by mouth daily as needed. 90 tablet 1  ? ipratropium (ATROVENT) 0.06 % nasal spray Place 2 sprays into both nostrils 4 (four) times daily. As needed for nasal congestion, runny nose 15 mL 0  ? metoprolol tartrate (LOPRESSOR) 100 MG tablet Take 1 tablet (100 mg total) by mouth 2 (two) times daily. 180 tablet 3  ? montelukast (SINGULAIR) 10 MG tablet TAKE 1 TABLET BY MOUTH AT  BEDTIME 90 tablet 3  ? NONFORMULARY OR COMPOUNDED ITEM Please dispense a blood pressure cuff to patient if approved through insurance. 1 each 1  ? nystatin cream (MYCOSTATIN) Apply 1 application topically 2 (two) times daily. 30 g 0  ? olmesartan (BENICAR) 40 MG tablet Take 1 tablet (40 mg total) by mouth daily. 90 tablet 3  ? omeprazole (PRILOSEC) 20 MG capsule Take  1 capsule (20 mg total) by mouth daily. 90 capsule 3  ? Propylene Glycol-Glycerin (MOISTURE EYES OP) Apply to eye as needed.    ? rosuvastatin (CRESTOR) 40 MG tablet Take 1 tablet (40 mg total) by mouth daily. 90 tablet 1  ? vitamin B-12 (CYANOCOBALAMIN) 1000 MCG tablet Take 1,000 mcg by mouth daily.    ?  fluticasone (FLONASE) 50 MCG/ACT nasal spray Place 2 sprays into both nostrils as needed. 16 g 3  ? loratadine (CLARITIN) 10 MG tablet Take 1 tablet (10 mg total) by mouth daily. 90 tablet 3  ? predniSONE (DELTASONE) 10 MG tablet TAKE 3 TABLETS PO QD FOR 3 DAYS THEN TAKE 2 TABLETS PO QD FOR 3 DAYS THEN TAKE 1 TABLET PO QD FOR 3 DAYS THEN TAKE 1/2 TAB PO QD FOR 3 DAYS (Patient not taking: Reported on 01/31/2022) 20 tablet 0  ? promethazine-dextromethorphan (PROMETHAZINE-DM) 6.25-15 MG/5ML syrup Take 5 mLs by mouth 4 (four) times daily as needed for cough. (Patient not taking: Reported on 01/31/2022) 118 mL 0  ? ?No facility-administered medications prior to visit.  ? ? ?No Known Allergies ? ?Review of Systems  ?Constitutional:  Negative for chills, fever and malaise/fatigue.  ?HENT:  Negative for congestion and hearing loss.   ?Eyes:  Negative for discharge.  ?Respiratory:  Positive for shortness of breath. Negative for cough and sputum production.   ?Cardiovascular:  Positive for leg swelling. Negative for chest pain and palpitations.  ?Gastrointestinal:  Negative for abdominal pain, blood in stool, constipation, diarrhea, heartburn, nausea and vomiting.  ?Genitourinary:  Negative for dysuria, frequency, hematuria and urgency.  ?Musculoskeletal:  Negative for back pain, falls and myalgias.  ?     (-)Leg pain  ?Skin:  Negative for rash.  ?     (-)redness over legs  ?Neurological:  Negative for dizziness, sensory change, loss of consciousness, weakness and headaches.  ?Endo/Heme/Allergies:  Negative for environmental allergies. Does not bruise/bleed easily.  ?Psychiatric/Behavioral:  Negative for depression and  suicidal ideas. The patient is not nervous/anxious and does not have insomnia.   ? ?   ?Objective:  ?  ?Physical Exam ?Vitals and nursing note reviewed.  ?Constitutional:   ?   General: She is not in acute distress. ?   Appearance

## 2022-01-31 NOTE — Assessment & Plan Note (Signed)
con't flonase and antihistamine  ?

## 2022-02-13 ENCOUNTER — Other Ambulatory Visit: Payer: Self-pay | Admitting: Family Medicine

## 2022-02-13 DIAGNOSIS — I10 Essential (primary) hypertension: Secondary | ICD-10-CM

## 2022-03-01 NOTE — Patient Instructions (Incomplete)
[name] , Thank you for taking time to come for your Medicare Wellness Visit. I appreciate your ongoing commitment to your health goals. Please review the following plan we discussed and let me know if I can assist you in the future.   Screening recommendations/referrals: Colonoscopy: Done 02/18/2021 Repeat in 3 years  Mammogram: Done 10/03/2021 Repeat annually  Bone Density: Done 01/21/2009 Repeat every 2 years  Recommended yearly ophthalmology/optometry visit for glaucoma screening and checkup Recommended yearly dental visit for hygiene and checkup  Vaccinations: Influenza vaccine: Done 05/19/2021 Repeat annually  Pneumococcal vaccine: Done 07/07/2015 and 12/16/2012. Tdap vaccine: Done 04/18/2005 Repeat in 10 years  Shingles vaccine: Done 04/24/2008. Shingrix available at your local pharmacy.   Covid-19:Done 03/14/2021, 09/15/2021, 07/17/2020, 12/02/2019 and 2/29/2021.  Advanced directives: Please bring a copy of your health care power of attorney and living will to the office to be added to your chart at your convenience.   Conditions/risks identified: Aim for 30 minutes of exercise or brisk walking, 6-8 glasses of water, and 5 servings of fruits and vegetables each day.   Next appointment: Follow up in one year for your annual wellness visit 2024.   Preventive Care 74 Years and Older, Female Preventive care refers to lifestyle choices and visits with your health care provider that can promote health and wellness. What does preventive care include? A yearly physical exam. This is also called an annual well check. Dental exams once or twice a year. Routine eye exams. Ask your health care provider how often you should have your eyes checked. Personal lifestyle choices, including: Daily care of your teeth and gums. Regular physical activity. Eating a healthy diet. Avoiding tobacco and drug use. Limiting alcohol use. Practicing safe sex. Taking low-dose aspirin every  day. Taking vitamin and mineral supplements as recommended by your health care provider. What happens during an annual well check? The services and screenings done by your health care provider during your annual well check will depend on your age, overall health, lifestyle risk factors, and family history of disease. Counseling  Your health care provider may ask you questions about your: Alcohol use. Tobacco use. Drug use. Emotional well-being. Home and relationship well-being. Sexual activity. Eating habits. History of falls. Memory and ability to understand (cognition). Work and work Statistician. Reproductive health. Screening  You may have the following tests or measurements: Height, weight, and BMI. Blood pressure. Lipid and cholesterol levels. These may be checked every 5 years, or more frequently if you are over 68 years old. Skin check. Lung cancer screening. You may have this screening every year starting at age 66 if you have a 30-pack-year history of smoking and currently smoke or have quit within the past 15 years. Fecal occult blood test (FOBT) of the stool. You may have this test every year starting at age 88. Flexible sigmoidoscopy or colonoscopy. You may have a sigmoidoscopy every 5 years or a colonoscopy every 10 years starting at age 84. Hepatitis C blood test. Hepatitis B blood test. Sexually transmitted disease (STD) testing. Diabetes screening. This is done by checking your blood sugar (glucose) after you have not eaten for a while (fasting). You may have this done every 1-3 years. Bone density scan. This is done to screen for osteoporosis. You may have this done starting at age 24. Mammogram. This may be done every 1-2 years. Talk to your health care provider about how often you should have regular mammograms. Talk with your health care provider about your test  results, treatment options, and if necessary, the need for more tests. Vaccines  Your health care  provider may recommend certain vaccines, such as: Influenza vaccine. This is recommended every year. Tetanus, diphtheria, and acellular pertussis (Tdap, Td) vaccine. You may need a Td booster every 10 years. Zoster vaccine. You may need this after age 26. Pneumococcal 13-valent conjugate (PCV13) vaccine. One dose is recommended after age 51. Pneumococcal polysaccharide (PPSV23) vaccine. One dose is recommended after age 44. Talk to your health care provider about which screenings and vaccines you need and how often you need them. This information is not intended to replace advice given to you by your health care provider. Make sure you discuss any questions you have with your health care provider. Document Released: 10/01/2015 Document Revised: 05/24/2016 Document Reviewed: 07/06/2015 Elsevier Interactive Patient Education  2017 [place] Prevention in the Home Falls can cause injuries. They can happen to people of all ages. There are many things you can do to make your home safe and to help prevent falls. What can I do on the outside of my home? Regularly fix the edges of walkways and driveways and fix any cracks. Remove anything that might make you trip as you walk through a door, such as a raised step or threshold. Trim any bushes or trees on the path to your home. Use bright outdoor lighting. Clear any walking paths of anything that might make someone trip, such as rocks or tools. Regularly check to see if handrails are loose or broken. Make sure that both sides of any steps have handrails. Any raised decks and porches should have guardrails on the edges. Have any leaves, snow, or ice cleared regularly. Use sand or salt on walking paths during winter. Clean up any spills in your garage right away. This includes oil or grease spills. What can I do in the bathroom? Use night lights. Install grab bars by the toilet and in the tub and shower. Do not use towel bars as grab  bars. Use non-skid mats or decals in the tub or shower. If you need to sit down in the shower, use a plastic, non-slip stool. Keep the floor dry. Clean up any water that spills on the floor as soon as it happens. Remove soap buildup in the tub or shower regularly. Attach bath mats securely with double-sided non-slip rug tape. Do not have throw rugs and other things on the floor that can make you trip. What can I do in the bedroom? Use night lights. Make sure that you have a light by your bed that is easy to reach. Do not use any sheets or blankets that are too big for your bed. They should not hang down onto the floor. Have a firm chair that has side arms. You can use this for support while you get dressed. Do not have throw rugs and other things on the floor that can make you trip. What can I do in the kitchen? Clean up any spills right away. Avoid walking on wet floors. Keep items that you use a lot in easy-to-reach places. If you need to reach something above you, use a strong step stool that has a grab bar. Keep electrical cords out of the way. Do not use floor polish or wax that makes floors slippery. If you must use wax, use non-skid floor wax. Do not have throw rugs and other things on the floor that can make you trip. What can I do with my stairs?  Do not leave any items on the stairs. Make sure that there are handrails on both sides of the stairs and use them. Fix handrails that are broken or loose. Make sure that handrails are as long as the stairways. Check any carpeting to make sure that it is firmly attached to the stairs. Fix any carpet that is loose or worn. Avoid having throw rugs at the top or bottom of the stairs. If you do have throw rugs, attach them to the floor with carpet tape. Make sure that you have a light switch at the top of the stairs and the bottom of the stairs. If you do not have them, ask someone to add them for you. What else can I do to help prevent  falls? Wear shoes that: Do not have high heels. Have rubber bottoms. Are comfortable and fit you well. Are closed at the toe. Do not wear sandals. If you use a stepladder: Make sure that it is fully opened. Do not climb a closed stepladder. Make sure that both sides of the stepladder are locked into place. Ask someone to hold it for you, if possible. Clearly mark and make sure that you can see: Any grab bars or handrails. First and last steps. Where the edge of each step is. Use tools that help you move around (mobility aids) if they are needed. These include: Canes. Walkers. Scooters. Crutches. Turn on the lights when you go into a dark area. Replace any light bulbs as soon as they burn out. Set up your furniture so you have a clear path. Avoid moving your furniture around. If any of your floors are uneven, fix them. If there are any pets around you, be aware of where they are. Review your medicines with your doctor. Some medicines can make you feel dizzy. This can increase your chance of falling. Ask your doctor what other things that you can do to help prevent falls. This information is not intended to replace advice given to you by your health care provider. Make sure you discuss any questions you have with your health care provider. Document Released: 07/01/2009 Document Revised: 02/10/2016 Document Reviewed: 10/09/2014 Elsevier Interactive Patient Education  2017 [employer].

## 2022-03-02 ENCOUNTER — Ambulatory Visit: Payer: Medicare Other

## 2022-03-07 DIAGNOSIS — H33001 Unspecified retinal detachment with retinal break, right eye: Secondary | ICD-10-CM | POA: Diagnosis not present

## 2022-03-07 DIAGNOSIS — H5211 Myopia, right eye: Secondary | ICD-10-CM | POA: Diagnosis not present

## 2022-03-14 DIAGNOSIS — H33321 Round hole, right eye: Secondary | ICD-10-CM | POA: Diagnosis not present

## 2022-03-14 DIAGNOSIS — H35373 Puckering of macula, bilateral: Secondary | ICD-10-CM | POA: Diagnosis not present

## 2022-03-14 DIAGNOSIS — H43813 Vitreous degeneration, bilateral: Secondary | ICD-10-CM | POA: Diagnosis not present

## 2022-03-14 DIAGNOSIS — H31093 Other chorioretinal scars, bilateral: Secondary | ICD-10-CM | POA: Diagnosis not present

## 2022-03-14 DIAGNOSIS — H35412 Lattice degeneration of retina, left eye: Secondary | ICD-10-CM | POA: Diagnosis not present

## 2022-03-25 ENCOUNTER — Ambulatory Visit
Admission: EM | Admit: 2022-03-25 | Discharge: 2022-03-25 | Disposition: A | Discharge status: Home / Self Care | Payer: Medicare Other | Attending: Urgent Care | Admitting: Urgent Care

## 2022-03-25 ENCOUNTER — Encounter: Payer: Self-pay | Admitting: Emergency Medicine

## 2022-03-25 DIAGNOSIS — R0602 Shortness of breath: Secondary | ICD-10-CM | POA: Diagnosis not present

## 2022-03-25 DIAGNOSIS — J4540 Moderate persistent asthma, uncomplicated: Secondary | ICD-10-CM | POA: Diagnosis not present

## 2022-03-25 DIAGNOSIS — Z8709 Personal history of other diseases of the respiratory system: Secondary | ICD-10-CM | POA: Diagnosis not present

## 2022-03-25 DIAGNOSIS — J988 Other specified respiratory disorders: Secondary | ICD-10-CM

## 2022-03-25 DIAGNOSIS — R053 Chronic cough: Secondary | ICD-10-CM

## 2022-03-25 DIAGNOSIS — B9789 Other viral agents as the cause of diseases classified elsewhere: Secondary | ICD-10-CM | POA: Diagnosis not present

## 2022-03-25 MED ORDER — TRIAMCINOLONE ACETONIDE 40 MG/ML IJ SUSP
60.0000 mg | Freq: Once | INTRAMUSCULAR | Status: AC
  Administered 2022-03-25: 60 mg via INTRAMUSCULAR

## 2022-03-25 MED ORDER — PROMETHAZINE-DM 6.25-15 MG/5ML PO SYRP: 2.5000 mL | ORAL_SOLUTION | Freq: Three times a day (TID) | ORAL | 0 refills | Status: DC | PRN

## 2022-03-25 MED ORDER — ALBUTEROL SULFATE HFA 108 (90 BASE) MCG/ACT IN AERS: INHALATION_SPRAY | RESPIRATORY_TRACT | 0 refills | Status: DC

## 2022-03-25 NOTE — ED Provider Notes (Signed)
[facility]   MRN: [id number] DOB: 1946-07-13  Subjective:   Wendy Christensen is a 76 y.o. female presenting for 3-day history of cold-like symptoms.  This is aggravated her asthma and she has been using her albuterol regularly.  Has had some shortness of breath and wheezing, persistent coughing.  She was at the beach around a lot of people over the weekend and would like to be tested for COVID.  No fever, chest pain, body pains, throat pain.  She takes her Breo Ellipta every day.  She is not a smoker.  No current facility-administered medications for this encounter.  Current Outpatient Medications:    albuterol (VENTOLIN HFA) 108 (90 Base) MCG/ACT inhaler, USE 2 INHALATIONS BY MOUTH  EVERY 4 HOURS AS NEEDED FOR WHEEZING, Disp: 54 g, Rfl: 1   ALPRAZolam (XANAX) 0.25 MG tablet, Take 1 tablet (0.25 mg total) by mouth 2 (two) times daily as needed for anxiety., Disp: 20 tablet, Rfl: 0   amLODipine (NORVASC) 10 MG tablet, Take 1 tablet (10 mg total) by mouth daily., Disp: 90 tablet, Rfl: 1   amLODipine (NORVASC) 5 MG tablet, TAKE 1 TABLET BY MOUTH  DAILY, Disp: 90 tablet, Rfl: 1   citalopram (CELEXA) 40 MG tablet, Take 1 tablet (40 mg total) by mouth every morning., Disp: 90 tablet, Rfl: 3   fenofibrate 160 MG tablet, Take 1 tablet (160 mg total) by mouth daily., Disp: 90 tablet, Rfl: 3   fluticasone (FLONASE) 50 MCG/ACT nasal spray, Place 2 sprays into both nostrils as needed., Disp: 16 g, Rfl: 3   fluticasone furoate-vilanterol (BREO ELLIPTA) 200-25 MCG/INH AEPB, Inhale 1 puff into the lungs daily. Per Dr. Donneta Romberg, Disp: 3 each, Rfl: 3   hydrochlorothiazide (HYDRODIURIL) 25 MG tablet, TAKE 1 TABLET BY MOUTH  DAILY AS NEEDED, Disp: 90 tablet, Rfl: 3   ipratropium (ATROVENT) 0.06 % nasal spray, Place 2 sprays into both nostrils 4 (four) times daily. As needed for nasal congestion, runny nose, Disp: 15 mL, Rfl: 0   loratadine (CLARITIN) 10 MG tablet, Take 1 tablet (10  mg total) by mouth daily., Disp: 90 tablet, Rfl: 3   metoprolol tartrate (LOPRESSOR) 100 MG tablet, Take 1 tablet (100 mg total) by mouth 2 (two) times daily., Disp: 180 tablet, Rfl: 3   montelukast (SINGULAIR) 10 MG tablet, TAKE 1 TABLET BY MOUTH AT  BEDTIME, Disp: 90 tablet, Rfl: 3   NONFORMULARY OR COMPOUNDED ITEM, Please dispense a blood pressure cuff to patient if approved through insurance., Disp: 1 each, Rfl: 1   nystatin cream (MYCOSTATIN), Apply 1 application topically 2 (two) times daily., Disp: 30 g, Rfl: 0   olmesartan (BENICAR) 40 MG tablet, Take 1 tablet (40 mg total) by mouth daily., Disp: 90 tablet, Rfl: 3   omeprazole (PRILOSEC) 20 MG capsule, Take 1 capsule (20 mg total) by mouth daily., Disp: 90 capsule, Rfl: 3   Propylene Glycol-Glycerin (MOISTURE EYES OP), Apply to eye as needed., Disp: , Rfl:    rosuvastatin (CRESTOR) 40 MG tablet, Take 1 tablet (40 mg total) by mouth daily., Disp: 90 tablet, Rfl: 1   vitamin B-12 (CYANOCOBALAMIN) 1000 MCG tablet, Take 1,000 mcg by mouth daily., Disp: , Rfl:    No Known Allergies  Past Medical History:  Diagnosis Date   Allergic rhinitis    Allergy    seasonal   Anxiety    Asthma, intrinsic    Cataract    bilateral repair   Depression  Frequency of urination    GERD (gastroesophageal reflux disease)    Hyperlipidemia    Hypertension    Kidney stone    Left kidney-calcium    OSA on CPAP    PT IN PROCESS OF GETTING MACHINE SET UP   Sleep apnea    cpap   Thyroid goiter    Urgency of urination    Wears glasses      Past Surgical History:  Procedure Laterality Date   BREAST BIOPSY     CATARACT EXTRACTION, BILATERAL     COLONOSCOPY  2013   CYSTOSCOPY WITH RETROGRADE PYELOGRAM, URETEROSCOPY AND STENT PLACEMENT Left 06/21/2015   Procedure: CYSTOSCOPY WITH RETROGRADE PYELOGRAM, URETEROSCOPY AND STENT PLACEMENT;  Surgeon: Nickie Retort, MD;  Location: [facility];  Service: Urology;  Laterality: Left;    DILATION AND CURETTAGE OF UTERUS  1990   W/ HYSTEROSCOPY   EYE SURGERY     retina both eyes 6/19   HOLMIUM LASER APPLICATION Left 49/09/7913   Procedure: HOLMIUM LASER APPLICATION;  Surgeon: Nickie Retort, MD;  Location: [facility];  Service: Urology;  Laterality: Left;   STONE EXTRACTION WITH BASKET Left 06/21/2015   Procedure: STONE EXTRACTION WITH BASKET;  Surgeon: Nickie Retort, MD;  Location: [facility];  Service: Urology;  Laterality: Left;   TUBAL LIGATION  30's    Family History  Problem Relation Age of Onset   Hypertension Mother    Heart disease Mother    Stroke Mother    Heart attack Mother    COPD Father    Asthma Father    Stroke Brother    Hypertension Brother    Hypertension Brother    Kidney cancer Maternal Aunt    Heart attack Maternal Aunt    Breast cancer Cousin    Breast cancer Cousin    Hyperlipidemia Other    Hypertension Other    Colon cancer Neg Hx    Colon polyps Neg Hx    Esophageal cancer Neg Hx    Rectal cancer Neg Hx    Stomach cancer Neg Hx     Social History   Tobacco Use   Smoking status: Never   Smokeless tobacco: Never  Vaping Use   Vaping Use: Never used  Substance Use Topics   Alcohol use: Not Currently    Alcohol/week: 0.0 standard drinks of alcohol   Drug use: No    ROS   Objective:   Vitals: BP (!) 145/76   Pulse 60   Temp 98.3 F (36.8 C)   Resp 20   SpO2 96%   Physical Exam Constitutional:      General: She is not in acute distress.    Appearance: Normal appearance. She is well-developed. She is not ill-appearing, toxic-appearing or diaphoretic.  HENT:     Head: Normocephalic and atraumatic.     Nose: Nose normal.     Mouth/Throat:     Mouth: Mucous membranes are moist.  Eyes:     General: No scleral icterus.       Right eye: No discharge.        Left eye: No discharge.     Extraocular Movements: Extraocular movements intact.  Cardiovascular:     Rate and  Rhythm: Normal rate and regular rhythm.     Heart sounds: Normal heart sounds. No murmur heard.    No friction rub. No gallop.  Pulmonary:     Effort: Pulmonary effort is normal. No respiratory  distress.     Breath sounds: No stridor. No decreased breath sounds, wheezing, rhonchi or rales.  Chest:     Chest wall: No tenderness.  Skin:    General: Skin is warm and dry.  Neurological:     General: No focal deficit present.     Mental Status: She is alert and oriented to person, place, and time.  Psychiatric:        Mood and Affect: Mood normal.        Behavior: Behavior normal.    IM triamcinolone '60mg'$  in clinic.   Assessment and Plan :   PDMP not reviewed this encounter.  1. Viral respiratory illness   2. Persistent cough   3. Moderate persistent asthma without complication   4. Shortness of breath   5. History of asthma    Offered a chest x-ray but patient declined.  In the context of her allergic rhinitis and asthma, severity of her symptoms patient wanted to use steroids and I was agreeable.  She requested an injection and therefore we did IM triamcinolone as above.  Recommended supportive care otherwise for a viral respiratory illness.  I refilled her albuterol inhaler.  COVID-19 testing is pending. Counseled patient on potential for adverse effects with medications prescribed/recommended today, ER and return-to-clinic precautions discussed, patient verbalized understanding.    Jaynee Eagles, PA-C 03/25/22 1324

## 2022-03-25 NOTE — ED Triage Notes (Signed)
Pt here with asthma exacerbation x 3 days. Pt states she came back from the beach and began to have SOB and cough.

## 2022-03-27 LAB — NOVEL CORONAVIRUS, NAA: SARS-CoV-2, NAA: NOT DETECTED

## 2022-03-28 DIAGNOSIS — H33321 Round hole, right eye: Secondary | ICD-10-CM | POA: Diagnosis not present

## 2022-03-28 DIAGNOSIS — H35373 Puckering of macula, bilateral: Secondary | ICD-10-CM | POA: Diagnosis not present

## 2022-03-28 DIAGNOSIS — H31093 Other chorioretinal scars, bilateral: Secondary | ICD-10-CM | POA: Diagnosis not present

## 2022-03-28 DIAGNOSIS — H35033 Hypertensive retinopathy, bilateral: Secondary | ICD-10-CM | POA: Diagnosis not present

## 2022-04-10 NOTE — Progress Notes (Unsigned)
04/11/22- 76 yoF never smoker for sleep evaluation HST 01/27/13- AHI 17.6/ hr, desaturation to 87%, body weight 191 lbs  CPAP to 10 Had CPAP from [facility], managed by Dr Gwenette Greet, then Dr Elsworth Soho. LOV around 2017. Medical problem list includes HTN, Allergic Rhinitis, Asthma, GERD, Goiter/ Hypothyroid, Anxiety/ Depression, Hyperlipidemia, Insomnia,  Epworth score-10 Body weight today- 187 lbs -----Pt consult seen [facility] in 2017 for OSA, here to reestablish care and get new CPAP. Getting approx 4hr/night, waking up feeling tired. Currently not using Download from 2018 with CPAP 10 showed compliance 90%, AHI 3.7/ hr.machine because of electrical issues

## 2022-04-11 ENCOUNTER — Encounter: Payer: Self-pay | Admitting: Internal Medicine

## 2022-04-11 ENCOUNTER — Ambulatory Visit (INDEPENDENT_AMBULATORY_CARE_PROVIDER_SITE_OTHER): Payer: Medicare Other | Admitting: Internal Medicine

## 2022-04-11 VITALS — BP 122/70 | HR 56 | Ht 65.0 in | Wt 187.4 lb

## 2022-04-11 DIAGNOSIS — G4733 Obstructive sleep apnea (adult) (pediatric): Secondary | ICD-10-CM | POA: Diagnosis not present

## 2022-04-11 DIAGNOSIS — J45909 Unspecified asthma, uncomplicated: Secondary | ICD-10-CM

## 2022-04-11 NOTE — Patient Instructions (Signed)
Order- DME [facility]- please replace old CPAP machine (reports shorting out). Change to auto 5-15, mask of choice, humidifier, supplies. Please add AirView/ card.  Let us know if we can help

## 2022-04-13 NOTE — Assessment & Plan Note (Signed)
She had benefit from CPAP until old machine burned out. Plan- [facility] replace old machine, change to autopap 5-15

## 2022-04-13 NOTE — Assessment & Plan Note (Signed)
Asthma controlled by her allergist Our help not currently needed.

## 2022-04-20 DIAGNOSIS — G4733 Obstructive sleep apnea (adult) (pediatric): Secondary | ICD-10-CM | POA: Diagnosis not present

## 2022-05-02 ENCOUNTER — Other Ambulatory Visit: Payer: Self-pay | Admitting: Family Medicine

## 2022-05-02 DIAGNOSIS — K219 Gastro-esophageal reflux disease without esophagitis: Secondary | ICD-10-CM

## 2022-05-04 ENCOUNTER — Ambulatory Visit (INDEPENDENT_AMBULATORY_CARE_PROVIDER_SITE_OTHER): Payer: Medicare Other | Admitting: Family Medicine

## 2022-05-04 ENCOUNTER — Encounter: Payer: Self-pay | Admitting: Family Medicine

## 2022-05-04 VITALS — BP 124/64 | HR 54 | Temp 97.7°F | Resp 18 | Ht 65.0 in | Wt 185.8 lb

## 2022-05-04 DIAGNOSIS — Z0000 Encounter for general adult medical examination without abnormal findings: Secondary | ICD-10-CM

## 2022-05-04 DIAGNOSIS — F411 Generalized anxiety disorder: Secondary | ICD-10-CM

## 2022-05-04 DIAGNOSIS — E039 Hypothyroidism, unspecified: Secondary | ICD-10-CM | POA: Diagnosis not present

## 2022-05-04 DIAGNOSIS — E2839 Other primary ovarian failure: Secondary | ICD-10-CM | POA: Diagnosis not present

## 2022-05-04 DIAGNOSIS — J302 Other seasonal allergic rhinitis: Secondary | ICD-10-CM

## 2022-05-04 DIAGNOSIS — T7840XA Allergy, unspecified, initial encounter: Secondary | ICD-10-CM

## 2022-05-04 DIAGNOSIS — E785 Hyperlipidemia, unspecified: Secondary | ICD-10-CM

## 2022-05-04 DIAGNOSIS — G4733 Obstructive sleep apnea (adult) (pediatric): Secondary | ICD-10-CM

## 2022-05-04 DIAGNOSIS — K219 Gastro-esophageal reflux disease without esophagitis: Secondary | ICD-10-CM | POA: Diagnosis not present

## 2022-05-04 DIAGNOSIS — I10 Essential (primary) hypertension: Secondary | ICD-10-CM | POA: Diagnosis not present

## 2022-05-04 LAB — LIPID PANEL
Cholesterol: 184 mg/dL (ref 0–200)
HDL: 64.5 mg/dL (ref >39.00)
LDL Cholesterol: 109 mg/dL — ABNORMAL HIGH (ref 0–99)
NonHDL: 119.62
Total CHOL/HDL Ratio: 3
Triglycerides: 52 mg/dL (ref 0.0–149.0)
VLDL: 10.4 mg/dL (ref 0.0–40.0)

## 2022-05-04 LAB — COMPREHENSIVE METABOLIC PANEL
ALT: 11 U/L (ref 0–35)
AST: 16 U/L (ref 0–37)
Albumin: 4.1 g/dL (ref 3.5–5.2)
Alkaline Phosphatase: 54 U/L (ref 39–117)
BUN: 23 mg/dL (ref 6–23)
CO2: 30 mEq/L (ref 19–32)
Calcium: 9.3 mg/dL (ref 8.4–10.5)
Chloride: 98 mEq/L (ref 96–112)
Creatinine, Ser: 1.56 mg/dL — ABNORMAL HIGH (ref 0.40–1.20)
GFR: 32.56 mL/min — ABNORMAL LOW (ref >60.00)
Glucose, Bld: 80 mg/dL (ref 70–99)
Potassium: 4.5 mEq/L (ref 3.5–5.1)
Sodium: 138 mEq/L (ref 135–145)
Total Bilirubin: 0.5 mg/dL (ref 0.2–1.2)
Total Protein: 7.1 g/dL (ref 6.0–8.3)

## 2022-05-04 LAB — CBC WITH DIFFERENTIAL/PLATELET
Basophils Absolute: 0 10*3/uL (ref 0.0–0.1)
Basophils Relative: 0.5 % (ref 0.0–3.0)
Eosinophils Absolute: 0.3 10*3/uL (ref 0.0–0.7)
Eosinophils Relative: 4 % (ref 0.0–5.0)
HCT: 36.4 % (ref 36.0–46.0)
Hemoglobin: 11.5 g/dL — ABNORMAL LOW (ref 12.0–15.0)
Lymphocytes Relative: 23.3 % (ref 12.0–46.0)
Lymphs Abs: 1.5 10*3/uL (ref 0.7–4.0)
MCHC: 31.7 g/dL (ref 30.0–36.0)
MCV: 84.2 fl (ref 78.0–100.0)
Monocytes Absolute: 0.6 10*3/uL (ref 0.1–1.0)
Monocytes Relative: 8.7 % (ref 3.0–12.0)
Neutro Abs: 4.1 10*3/uL (ref 1.4–7.7)
Neutrophils Relative %: 63.5 % (ref 43.0–77.0)
Platelets: 285 10*3/uL (ref 150.0–400.0)
RBC: 4.33 Mil/uL (ref 3.87–5.11)
RDW: 14.3 % (ref 11.5–15.5)
WBC: 6.4 10*3/uL (ref 4.0–10.5)

## 2022-05-04 MED ORDER — LORATADINE 10 MG PO TABS: 10.0000 mg | ORAL_TABLET | Freq: Every day | ORAL | 3 refills | Status: DC

## 2022-05-04 MED ORDER — FENOFIBRATE 160 MG PO TABS: 160.0000 mg | ORAL_TABLET | Freq: Every day | ORAL | 3 refills | Status: DC

## 2022-05-04 MED ORDER — MONTELUKAST SODIUM 10 MG PO TABS: 10.0000 mg | ORAL_TABLET | Freq: Every day | ORAL | 3 refills | Status: DC

## 2022-05-04 MED ORDER — CITALOPRAM HYDROBROMIDE 40 MG PO TABS: 40.0000 mg | ORAL_TABLET | Freq: Every morning | ORAL | 3 refills | Status: DC

## 2022-05-04 MED ORDER — OLMESARTAN MEDOXOMIL 40 MG PO TABS: 40.0000 mg | ORAL_TABLET | Freq: Every day | ORAL | 3 refills | Status: DC

## 2022-05-04 MED ORDER — ROSUVASTATIN CALCIUM 40 MG PO TABS: 40.0000 mg | ORAL_TABLET | Freq: Every day | ORAL | 1 refills | Status: DC

## 2022-05-04 MED ORDER — AMLODIPINE BESYLATE 5 MG PO TABS: ORAL_TABLET | ORAL | 1 refills | Status: DC

## 2022-05-04 MED ORDER — FLUTICASONE PROPIONATE 50 MCG/ACT NA SUSP: 2.0000 | NASAL | 3 refills | Status: DC | PRN

## 2022-05-04 MED ORDER — METOPROLOL TARTRATE 100 MG PO TABS: 100.0000 mg | ORAL_TABLET | Freq: Two times a day (BID) | ORAL | 3 refills | Status: DC

## 2022-05-04 MED ORDER — ALPRAZOLAM 0.25 MG PO TABS: 0.2500 mg | ORAL_TABLET | Freq: Two times a day (BID) | ORAL | 0 refills | Status: DC | PRN

## 2022-05-04 MED ORDER — OMEPRAZOLE 20 MG PO CPDR: 20.0000 mg | DELAYED_RELEASE_CAPSULE | Freq: Every day | ORAL | 3 refills | Status: DC

## 2022-05-04 MED ORDER — HYDROCHLOROTHIAZIDE 25 MG PO TABS: 25.0000 mg | ORAL_TABLET | Freq: Every day | ORAL | 3 refills | Status: DC | PRN

## 2022-05-04 NOTE — Assessment & Plan Note (Signed)
Well controlled, no changes to meds. Encouraged heart healthy diet such as the DASH diet and exercise as tolerated.  °

## 2022-05-04 NOTE — Assessment & Plan Note (Signed)
ghm utd Check labs  See avs  

## 2022-05-04 NOTE — Assessment & Plan Note (Signed)
Per pulm  On cpap 

## 2022-05-04 NOTE — Patient Instructions (Signed)
Preventive Care 65 Years and Older, Female Preventive care refers to lifestyle choices and visits with your health care provider that can promote health and wellness. Preventive care visits are also called wellness exams. What can I expect for my preventive care visit? Counseling Your health care provider may ask you questions about your: Medical history, including: Past medical problems. Family medical history. Pregnancy and menstrual history. History of falls. Current health, including: Memory and ability to understand (cognition). Emotional well-being. Home life and relationship well-being. Sexual activity and sexual health. Lifestyle, including: Alcohol, nicotine or tobacco, and drug use. Access to firearms. Diet, exercise, and sleep habits. Work and work environment. Sunscreen use. Safety issues such as seatbelt and bike helmet use. Physical exam Your health care provider will check your: Height and weight. These may be used to calculate your BMI (body mass index). BMI is a measurement that tells if you are at a healthy weight. Waist circumference. This measures the distance around your waistline. This measurement also tells if you are at a healthy weight and may help predict your risk of certain diseases, such as type 2 diabetes and high blood pressure. Heart rate and blood pressure. Body temperature. Skin for abnormal spots. What immunizations do I need?  Vaccines are usually given at various ages, according to a schedule. Your health care provider will recommend vaccines for you based on your age, medical history, and lifestyle or other factors, such as travel or where you work. What tests do I need? Screening Your health care provider may recommend screening tests for certain conditions. This may include: Lipid and cholesterol levels. Hepatitis C test. Hepatitis B test. HIV (human immunodeficiency virus) test. STI (sexually transmitted infection) testing, if you are at  risk. Lung cancer screening. Colorectal cancer screening. Diabetes screening. This is done by checking your blood sugar (glucose) after you have not eaten for a while (fasting). Mammogram. Talk with your health care provider about how often you should have regular mammograms. BRCA-related cancer screening. This may be done if you have a family history of breast, ovarian, tubal, or peritoneal cancers. Bone density scan. This is done to screen for osteoporosis. Talk with your health care provider about your test results, treatment options, and if necessary, the need for more tests. Follow these instructions at home: Eating and drinking  Eat a diet that includes fresh fruits and vegetables, whole grains, lean protein, and low-fat dairy products. Limit your intake of foods with high amounts of sugar, saturated fats, and salt. Take vitamin and mineral supplements as recommended by your health care provider. Do not drink alcohol if your health care provider tells you not to drink. If you drink alcohol: Limit how much you have to 0-1 drink a day. Know how much alcohol is in your drink. In the [place] one drink equals one 12 oz bottle of beer (355 mL), one 5 oz glass of wine (148 mL), or one 1 oz glass of hard liquor (44 mL). Lifestyle Brush your teeth every morning and night with fluoride toothpaste. Floss one time each day. Exercise for at least 30 minutes 5 or more days each week. Do not use any products that contain nicotine or tobacco. These products include cigarettes, chewing tobacco, and vaping devices, such as e-cigarettes. If you need help quitting, ask your health care provider. Do not use drugs. If you are sexually active, practice safe sex. Use a condom or other form of protection in order to prevent STIs. Take aspirin only as told by   your health care provider. Make sure that you understand how much to take and what form to take. Work with your health care provider to find out whether it  is safe and beneficial for you to take aspirin daily. Ask your health care provider if you need to take a cholesterol-lowering medicine (statin). Find healthy ways to manage stress, such as: Meditation, yoga, or listening to music. Journaling. Talking to a trusted person. Spending time with friends and family. Minimize exposure to UV radiation to reduce your risk of skin cancer. Safety Always wear your seat belt while driving or riding in a vehicle. Do not drive: If you have been drinking alcohol. Do not ride with someone who has been drinking. When you are tired or distracted. While texting. If you have been using any mind-altering substances or drugs. Wear a helmet and other protective equipment during sports activities. If you have firearms in your house, make sure you follow all gun safety procedures. What's next? Visit your health care provider once a year for an annual wellness visit. Ask your health care provider how often you should have your eyes and teeth checked. Stay up to date on all vaccines. This information is not intended to replace advice given to you by your health care provider. Make sure you discuss any questions you have with your health care provider. Document Revised: 03/02/2021 Document Reviewed: 03/02/2021 Elsevier Patient Education  2023 [employer]  

## 2022-05-04 NOTE — Assessment & Plan Note (Signed)
Encourage heart healthy diet such as MIND or DASH diet, increase exercise, avoid trans fats, simple carbohydrates and processed foods, consider a krill or fish or flaxseed oil cap daily.  °

## 2022-05-04 NOTE — Progress Notes (Signed)
Subjective:   By signing my name below, I, Shehryar Baig, attest that this documentation has been prepared under the direction and in the presence of [name], DO  05/04/2022      Patient ID: Wendy Christensen, female    DOB: 1946-02-13, 76 y.o.   MRN: [id number]  Chief Complaint  Patient presents with   Annual Exam    Pt states fasting     HPI Patient is in today for a comprehensive physical exam.   She is requesting a refill for 0.25 mg xanax, 5 mg amlodipine, 40 mg celexa, 160 mg fenofibrate, Flonase nasal spray, 25 mg hydrochlorothiazide, 10 mg claritin, 100 mg metoprolol tartrate, 10 mg sinulair, 40 mg benicar, 20 mg prilosec, 40 mg crestor.  She reprots tearing her retina recently. She has procedures done in both retinas in the past. She is following up with her eye doctor to manage her symptoms.  She denies having any fever, new moles, congestion, sinus pain, sore throat, chest pain, palpitations, cough, shortness of breath, wheezing, nausea, vomiting, diarrhea, constipation, dysuria, frequency, abdominal pain, hematuria, new muscle pain, new joint pain, headaches. She has no changes to her family medical history. She has no new surgical procedures to report.  She is due for bone density scan and is interested in scheduling an appointment.  She is due for a tetanus vaccine and is planning on receiving one at her pharmacy.  She participates in regular exercise by walking daily while working at [employer].    Past Medical History:  Diagnosis Date   Allergic rhinitis    Allergy    seasonal   Anxiety    Asthma, intrinsic    Cataract    bilateral repair   Depression    Frequency of urination    GERD (gastroesophageal reflux disease)    Hyperlipidemia    Hypertension    Kidney stone    Left kidney-calcium    OSA on CPAP    PT IN PROCESS OF GETTING MACHINE SET UP   Sleep apnea    cpap   Thyroid goiter    Urgency of urination    Wears glasses      Past Surgical History:  Procedure Laterality Date   BREAST BIOPSY     CATARACT EXTRACTION, BILATERAL     COLONOSCOPY  2013   CYSTOSCOPY WITH RETROGRADE PYELOGRAM, URETEROSCOPY AND STENT PLACEMENT Left 06/21/2015   Procedure: CYSTOSCOPY WITH RETROGRADE PYELOGRAM, URETEROSCOPY AND STENT PLACEMENT;  Surgeon: Nickie Retort, MD;  Location: [facility];  Service: Urology;  Laterality: Left;   DILATION AND CURETTAGE OF UTERUS  1990   W/ HYSTEROSCOPY   EYE SURGERY     retina both eyes 6/19   HOLMIUM LASER APPLICATION Left 56/11/1495   Procedure: HOLMIUM LASER APPLICATION;  Surgeon: Nickie Retort, MD;  Location: [facility];  Service: Urology;  Laterality: Left;   STONE EXTRACTION WITH BASKET Left 06/21/2015   Procedure: STONE EXTRACTION WITH BASKET;  Surgeon: Nickie Retort, MD;  [facility];  Service: Urology;  Laterality: Left;   TUBAL LIGATION  67's    Family History  Problem Relation Age of Onset   Hypertension Mother    Heart disease Mother    Stroke Mother    Heart attack Mother    COPD Father    Asthma Father    Stroke Brother    Hypertension Brother    Hypertension Brother  Kidney cancer Maternal Aunt    Heart attack Maternal Aunt    Breast cancer Cousin    Breast cancer Cousin    Hyperlipidemia Other    Hypertension Other    Colon cancer Neg Hx    Colon polyps Neg Hx    Esophageal cancer Neg Hx    Rectal cancer Neg Hx    Stomach cancer Neg Hx     Social History   Socioeconomic History   Marital status: Married    Spouse name: Not on file   Number of children: Not on file   Years of education: Not on file   Highest education level: Not on file  Occupational History   Occupation: Retired    Fish farm manager: DISABLE  Tobacco Use   Smoking status: Never   Smokeless tobacco: Never  Vaping Use   Vaping Use: Never used  Substance and Sexual Activity   Alcohol use: Not Currently    Alcohol/week:  0.0 standard drinks of alcohol   Drug use: No   Sexual activity: Yes    Partners: Male    Comment: Husband   Other Topics Concern   Not on file  Social History Narrative   Buyer, retail , daycare    Social Determinants of Health   Financial Resource Strain: Low Risk  (05/31/2020)   Overall Financial Resource Strain (CARDIA)    Difficulty of Paying Living Expenses: Not very hard  Food Insecurity: Not on file  Transportation Needs: No Transportation Needs (01/05/2021)   PRAPARE - Transportation    Lack of Transportation (Medical): No    Lack of Transportation (Non-Medical): No  Physical Activity: Insufficiently Active (01/05/2021)   Exercise Vital Sign    Days of Exercise per Week: 4 days    Minutes of Exercise per Session: 10 min  Stress: Not on file  Social Connections: Not on file  Intimate Partner Violence: Not on file    Outpatient Medications Prior to Visit  Medication Sig Dispense Refill   albuterol (VENTOLIN HFA) 108 (90 Base) MCG/ACT inhaler USE 2 INHALATIONS BY MOUTH  EVERY 4 HOURS AS NEEDED FOR WHEEZING 18 g 0   fluticasone furoate-vilanterol (BREO ELLIPTA) 200-25 MCG/INH AEPB Inhale 1 puff into the lungs daily. Per Dr. Donneta Romberg 3 each 3   NONFORMULARY OR COMPOUNDED ITEM Please dispense a blood pressure cuff to patient if approved through insurance. 1 each 1   Propylene Glycol-Glycerin (MOISTURE EYES OP) Apply to eye as needed.     vitamin B-12 (CYANOCOBALAMIN) 1000 MCG tablet Take 1,000 mcg by mouth daily.     ALPRAZolam (XANAX) 0.25 MG tablet Take 1 tablet (0.25 mg total) by mouth 2 (two) times daily as needed for anxiety. 20 tablet 0   amLODipine (NORVASC) 5 MG tablet TAKE 1 TABLET BY MOUTH  DAILY 90 tablet 1   citalopram (CELEXA) 40 MG tablet Take 1 tablet (40 mg total) by mouth every morning. 90 tablet 3   fenofibrate 160 MG tablet Take 1 tablet (160 mg total) by mouth daily. 90 tablet 3   fluticasone (FLONASE) 50 MCG/ACT nasal spray Place 2 sprays into both  nostrils as needed. 16 g 3   hydrochlorothiazide (HYDRODIURIL) 25 MG tablet TAKE 1 TABLET BY MOUTH  DAILY AS NEEDED 90 tablet 3   loratadine (CLARITIN) 10 MG tablet Take 1 tablet (10 mg total) by mouth daily. 90 tablet 3   metoprolol tartrate (LOPRESSOR) 100 MG tablet Take 1 tablet (100 mg total) by mouth 2 (two) times daily. 180 tablet 3  montelukast (SINGULAIR) 10 MG tablet TAKE 1 TABLET BY MOUTH AT  BEDTIME 90 tablet 3   olmesartan (BENICAR) 40 MG tablet Take 1 tablet (40 mg total) by mouth daily. 90 tablet 3   omeprazole (PRILOSEC) 20 MG capsule TAKE 1 CAPSULE BY MOUTH  DAILY 90 capsule 3   rosuvastatin (CRESTOR) 40 MG tablet Take 1 tablet (40 mg total) by mouth daily. 90 tablet 1   ipratropium (ATROVENT) 0.06 % nasal spray Place 2 sprays into both nostrils 4 (four) times daily. As needed for nasal congestion, runny nose (Patient not taking: Reported on 04/11/2022) 15 mL 0   nystatin cream (MYCOSTATIN) Apply 1 application topically 2 (two) times daily. (Patient not taking: Reported on 04/11/2022) 30 g 0   amLODipine (NORVASC) 10 MG tablet Take 1 tablet (10 mg total) by mouth daily. (Patient not taking: Reported on 04/11/2022) 90 tablet 1   promethazine-dextromethorphan (PROMETHAZINE-DM) 6.25-15 MG/5ML syrup Take 2.5 mLs by mouth 3 (three) times daily as needed for cough. (Patient not taking: Reported on 04/11/2022) 100 mL 0   No facility-administered medications prior to visit.    No Known Allergies  Review of Systems  Constitutional:  Negative for fever.  HENT:  Negative for congestion, sinus pain and sore throat.   Respiratory:  Negative for cough, shortness of breath and wheezing.   Cardiovascular:  Negative for chest pain and palpitations.  Gastrointestinal:  Negative for abdominal pain, constipation, diarrhea, nausea and vomiting.  Genitourinary:  Negative for dysuria, frequency and hematuria.  Musculoskeletal:        (-)new muscle pain (-)new joint pain  Skin:        (-)New moles   Neurological:  Negative for headaches.       Objective:    Physical Exam Vitals and nursing note reviewed.  Constitutional:      General: She is not in acute distress.    Appearance: Normal appearance. She is not ill-appearing.  HENT:     Head: Normocephalic and atraumatic.     Right Ear: Tympanic membrane, ear canal and external ear normal.     Left Ear: Tympanic membrane, ear canal and external ear normal.  Eyes:     Extraocular Movements: Extraocular movements intact.     Pupils: Pupils are equal, round, and reactive to light.  Cardiovascular:     Rate and Rhythm: Normal rate and regular rhythm.     Heart sounds: Normal heart sounds. No murmur heard.    No gallop.  Pulmonary:     Effort: Pulmonary effort is normal. No respiratory distress.     Breath sounds: Normal breath sounds. No wheezing or rales.  Abdominal:     General: Bowel sounds are normal. There is no distension.     Palpations: Abdomen is soft.     Tenderness: There is no abdominal tenderness. There is no guarding.  Skin:    General: Skin is warm and dry.  Neurological:     Mental Status: She is alert and oriented to person, place, and time.  Psychiatric:        Judgment: Judgment normal.     BP 124/64 (BP Location: Left Arm, Patient Position: Sitting, Cuff Size: Normal)   Pulse (!) 54   Temp 97.7 F (36.5 C) (Oral)   Resp 18   Ht '5\' 5"'$  (1.651 m)   Wt 185 lb 12.8 oz (84.3 kg)   SpO2 100%   BMI 30.92 kg/m  Wt Readings from Last 3 Encounters:  05/04/22 185 lb  12.8 oz (84.3 kg)  04/11/22 187 lb 6.4 oz (85 kg)  01/31/22 188 lb (85.3 kg)    Diabetic Foot Exam - Simple   No data filed    Lab Results  Component Value Date   WBC 5.5 11/03/2021   HGB 11.4 (L) 11/03/2021   HCT 35.4 (L) 11/03/2021   PLT 266.0 11/03/2021   GLUCOSE 92 11/03/2021   CHOL 202 (H) 11/03/2021   TRIG 88.0 11/03/2021   HDL 62.60 11/03/2021   LDLDIRECT 215.7 10/18/2011   LDLCALC 121 (H) 11/03/2021   ALT 14 11/03/2021    AST 19 11/03/2021   NA 136 11/03/2021   K 3.5 11/03/2021   CL 99 11/03/2021   CREATININE 1.17 11/03/2021   BUN 23 11/03/2021   CO2 35 (H) 11/03/2021   TSH 1.01 05/03/2021   HGBA1C 6.4 09/14/2014   MICROALBUR <0.7 03/23/2015    Lab Results  Component Value Date   TSH 1.01 05/03/2021   Lab Results  Component Value Date   WBC 5.5 11/03/2021   HGB 11.4 (L) 11/03/2021   HCT 35.4 (L) 11/03/2021   MCV 84.9 11/03/2021   PLT 266.0 11/03/2021   Lab Results  Component Value Date   NA 136 11/03/2021   K 3.5 11/03/2021   CO2 35 (H) 11/03/2021   GLUCOSE 92 11/03/2021   BUN 23 11/03/2021   CREATININE 1.17 11/03/2021   BILITOT 0.6 11/03/2021   ALKPHOS 49 11/03/2021   AST 19 11/03/2021   ALT 14 11/03/2021   PROT 7.4 11/03/2021   ALBUMIN 4.3 11/03/2021   CALCIUM 9.4 11/03/2021   ANIONGAP 9 02/24/2015   GFR 46.14 (L) 11/03/2021   Lab Results  Component Value Date   CHOL 202 (H) 11/03/2021   Lab Results  Component Value Date   HDL 62.60 11/03/2021   Lab Results  Component Value Date   LDLCALC 121 (H) 11/03/2021   Lab Results  Component Value Date   TRIG 88.0 11/03/2021   Lab Results  Component Value Date   CHOLHDL 3 11/03/2021   Lab Results  Component Value Date   HGBA1C 6.4 09/14/2014   Mammogram- Last completed 10/06/2021. Results are normal. Repeat in 1 year.  Dexa- Due. Colonoscopy- Last completed 02/18/2021. Results showed: - One 12 mm polyp in the transverse colon, removed with a cold snare. Resected and retrieved. - Diverticulosis in the sigmoid colon, in the descending colon, in the transverse colon and in the ascending colon. - Non-bleeding internal hemorrhoids. - Repeat colonoscopy in 3 - 5 years for surveillance based on pathology results.     Assessment & Plan:   Problem List Items Addressed This Visit       Unprioritized   GERD   Relevant Medications   omeprazole (PRILOSEC) 20 MG capsule   GENERALIZED ANXIETY DISORDER   Relevant  Medications   ALPRAZolam (XANAX) 0.25 MG tablet   citalopram (CELEXA) 40 MG tablet   Seasonal allergies   Relevant Medications   fluticasone (FLONASE) 50 MCG/ACT nasal spray   loratadine (CLARITIN) 10 MG tablet   Preventative health care    ghm utd Check labs  See avs      OSA (obstructive sleep apnea)    Per pulm On cpap      Hypothyroidism    Check labs       Relevant Medications   metoprolol tartrate (LOPRESSOR) 100 MG tablet   Hypertension    Well controlled, no changes to meds. Encouraged heart healthy diet such as  the DASH diet and exercise as tolerated.       Relevant Medications   amLODipine (NORVASC) 5 MG tablet   fenofibrate 160 MG tablet   hydrochlorothiazide (HYDRODIURIL) 25 MG tablet   metoprolol tartrate (LOPRESSOR) 100 MG tablet   olmesartan (BENICAR) 40 MG tablet   rosuvastatin (CRESTOR) 40 MG tablet   Hyperlipidemia LDL goal <100    Encourage heart healthy diet such as MIND or DASH diet, increase exercise, avoid trans fats, simple carbohydrates and processed foods, consider a krill or fish or flaxseed oil cap daily.       Relevant Medications   amLODipine (NORVASC) 5 MG tablet   fenofibrate 160 MG tablet   hydrochlorothiazide (HYDRODIURIL) 25 MG tablet   metoprolol tartrate (LOPRESSOR) 100 MG tablet   olmesartan (BENICAR) 40 MG tablet   rosuvastatin (CRESTOR) 40 MG tablet   Other Relevant Orders   CBC with Differential/Platelet   Comprehensive metabolic panel   Lipid panel   Essential hypertension    Well controlled, no changes to meds. Encouraged heart healthy diet such as the DASH diet and exercise as tolerated.       Relevant Medications   amLODipine (NORVASC) 5 MG tablet   fenofibrate 160 MG tablet   hydrochlorothiazide (HYDRODIURIL) 25 MG tablet   metoprolol tartrate (LOPRESSOR) 100 MG tablet   olmesartan (BENICAR) 40 MG tablet   rosuvastatin (CRESTOR) 40 MG tablet   Other Relevant Orders   CBC with Differential/Platelet    Comprehensive metabolic panel   Lipid panel   Other Visit Diagnoses     Estrogen deficiency    -  Primary   Relevant Orders   DG Bone Density   Allergy, initial encounter       Relevant Medications   montelukast (SINGULAIR) 10 MG tablet        Meds ordered this encounter  Medications   ALPRAZolam (XANAX) 0.25 MG tablet    Sig: Take 1 tablet (0.25 mg total) by mouth 2 (two) times daily as needed for anxiety.    Dispense:  20 tablet    Refill:  0   amLODipine (NORVASC) 5 MG tablet    Sig: TAKE 1 TABLET BY MOUTH  DAILY    Dispense:  90 tablet    Refill:  1    Requesting 1 year supply   citalopram (CELEXA) 40 MG tablet    Sig: Take 1 tablet (40 mg total) by mouth every morning.    Dispense:  90 tablet    Refill:  3   fenofibrate 160 MG tablet    Sig: Take 1 tablet (160 mg total) by mouth daily.    Dispense:  90 tablet    Refill:  3    Requesting 1 year supply   fluticasone (FLONASE) 50 MCG/ACT nasal spray    Sig: Place 2 sprays into both nostrils as needed.    Dispense:  16 g    Refill:  3   hydrochlorothiazide (HYDRODIURIL) 25 MG tablet    Sig: Take 1 tablet (25 mg total) by mouth daily as needed.    Dispense:  90 tablet    Refill:  3    Requesting 1 year supply   loratadine (CLARITIN) 10 MG tablet    Sig: Take 1 tablet (10 mg total) by mouth daily.    Dispense:  90 tablet    Refill:  3   metoprolol tartrate (LOPRESSOR) 100 MG tablet    Sig: Take 1 tablet (100 mg total) by mouth 2 (  two) times daily.    Dispense:  180 tablet    Refill:  3    Requesting 1 year supply   montelukast (SINGULAIR) 10 MG tablet    Sig: Take 1 tablet (10 mg total) by mouth at bedtime.    Dispense:  90 tablet    Refill:  3    Requesting 1 year supply   olmesartan (BENICAR) 40 MG tablet    Sig: Take 1 tablet (40 mg total) by mouth daily.    Dispense:  90 tablet    Refill:  3    Requesting 1 year supply   omeprazole (PRILOSEC) 20 MG capsule    Sig: Take 1 capsule (20 mg total) by  mouth daily.    Dispense:  90 capsule    Refill:  3    Please send a replace/new response with 90-Day Supply if appropriate to maximize member benefit. Requesting 1 year supply.   rosuvastatin (CRESTOR) 40 MG tablet    Sig: Take 1 tablet (40 mg total) by mouth daily.    Dispense:  90 tablet    Refill:  1    I, [name], DO, personally preformed the services described in this documentation.  All medical record entries made by the scribe were at my direction and in my presence.  I have reviewed the chart and discharge instructions (if applicable) and agree that the record reflects my personal performance and is accurate and complete. 05/04/2022   I,Shehryar Baig,acting as a scribe for [name], DO.,have documented all relevant documentation on the behalf of [name], DO,as directed by  [name], DO while in the presence of [name], DO.   [name], DO

## 2022-05-04 NOTE — Assessment & Plan Note (Signed)
Check labs 

## 2022-05-16 ENCOUNTER — Telehealth: Payer: Self-pay | Admitting: Family Medicine

## 2022-05-16 NOTE — Telephone Encounter (Signed)
Patient states she got lab work done on 08/17 and her hemoglobin was low. She would like a call back to discuss. Please advise.

## 2022-05-16 NOTE — Telephone Encounter (Signed)
Please advise. Labs were faxed

## 2022-05-17 ENCOUNTER — Other Ambulatory Visit: Payer: Self-pay

## 2022-05-17 DIAGNOSIS — D582 Other hemoglobinopathies: Secondary | ICD-10-CM

## 2022-05-17 NOTE — Telephone Encounter (Signed)
Pt was advised. Pt wanted labs sooner than 6 months. I placed orders and scheduled a lab appt for 3 months to recheck Hgb. Pt advised of low HgB sxs and to call us or go to the ED

## 2022-05-21 DIAGNOSIS — G4733 Obstructive sleep apnea (adult) (pediatric): Secondary | ICD-10-CM | POA: Diagnosis not present

## 2022-06-03 ENCOUNTER — Ambulatory Visit
Admission: EM | Admit: 2022-06-03 | Discharge: 2022-06-03 | Disposition: A | Discharge status: Home / Self Care | Payer: Medicare Other | Attending: Urgent Care | Admitting: Urgent Care

## 2022-06-03 ENCOUNTER — Encounter: Payer: Self-pay | Admitting: Emergency Medicine

## 2022-06-03 DIAGNOSIS — R944 Abnormal results of kidney function studies: Secondary | ICD-10-CM | POA: Insufficient documentation

## 2022-06-03 DIAGNOSIS — U071 COVID-19: Secondary | ICD-10-CM | POA: Insufficient documentation

## 2022-06-03 DIAGNOSIS — J45909 Unspecified asthma, uncomplicated: Secondary | ICD-10-CM | POA: Insufficient documentation

## 2022-06-03 DIAGNOSIS — J309 Allergic rhinitis, unspecified: Secondary | ICD-10-CM | POA: Insufficient documentation

## 2022-06-03 DIAGNOSIS — I10 Essential (primary) hypertension: Secondary | ICD-10-CM | POA: Insufficient documentation

## 2022-06-03 MED ORDER — MOLNUPIRAVIR EUA 200MG CAPSULE: 4.0000 | ORAL_CAPSULE | Freq: Two times a day (BID) | ORAL | 0 refills | Status: AC

## 2022-06-03 NOTE — Discharge Instructions (Addendum)
For sore throat or cough try using a honey-based tea. Use 3 teaspoons of honey with juice squeezed from half lemon. Place shaved pieces of ginger into 1/2-1 cup of water and warm over stove top. Then mix the ingredients and repeat every 4 hours as needed. Please take Tylenol '500mg'$ -'650mg'$  once every 6 hours for fevers, aches and pains. Hydrate very well with at least 2 liters (64 ounces) of water. Eat light meals such as soups (chicken and noodles, chicken wild rice, vegetable).  Do not eat any foods that you are allergic to.  Start an antihistamine like Zyrtec for postnasal drainage, sinus congestion.

## 2022-06-03 NOTE — ED Triage Notes (Signed)
Pt here with congestion and scratchy throat starting today. Pt husband is positive for covid.

## 2022-06-03 NOTE — ED Provider Notes (Signed)
[facility]  Note:  This document was prepared using Systems analyst and may include unintentional dictation errors.  MRN: [id number] DOB: 06/30/1946  Subjective:   Wendy Christensen is a 76 y.o. female presenting for 1 day history of acute onset scratchy throat, congestion.  Patient tested positive for COVID-19.  She would like COVID antivirals.  No chest pain, shortness of breath or wheezing.  Has a history of asthma, allergic rhinitis, decreased GFR, renal insufficiency.  No current facility-administered medications for this encounter.  Current Outpatient Medications:    albuterol (VENTOLIN HFA) 108 (90 Base) MCG/ACT inhaler, USE 2 INHALATIONS BY MOUTH  EVERY 4 HOURS AS NEEDED FOR WHEEZING, Disp: 18 g, Rfl: 0   ALPRAZolam (XANAX) 0.25 MG tablet, Take 1 tablet (0.25 mg total) by mouth 2 (two) times daily as needed for anxiety., Disp: 20 tablet, Rfl: 0   amLODipine (NORVASC) 5 MG tablet, TAKE 1 TABLET BY MOUTH  DAILY, Disp: 90 tablet, Rfl: 1   citalopram (CELEXA) 40 MG tablet, Take 1 tablet (40 mg total) by mouth every morning., Disp: 90 tablet, Rfl: 3   fenofibrate 160 MG tablet, Take 1 tablet (160 mg total) by mouth daily., Disp: 90 tablet, Rfl: 3   fluticasone (FLONASE) 50 MCG/ACT nasal spray, Place 2 sprays into both nostrils as needed., Disp: 16 g, Rfl: 3   fluticasone furoate-vilanterol (BREO ELLIPTA) 200-25 MCG/INH AEPB, Inhale 1 puff into the lungs daily. Per Dr. Donneta Romberg, Disp: 3 each, Rfl: 3   hydrochlorothiazide (HYDRODIURIL) 25 MG tablet, Take 1 tablet (25 mg total) by mouth daily as needed., Disp: 90 tablet, Rfl: 3   ipratropium (ATROVENT) 0.06 % nasal spray, Place 2 sprays into both nostrils 4 (four) times daily. As needed for nasal congestion, runny nose (Patient not taking: Reported on 04/11/2022), Disp: 15 mL, Rfl: 0   loratadine (CLARITIN) 10 MG tablet, Take 1 tablet (10 mg total) by mouth daily., Disp: 90 tablet, Rfl: 3    metoprolol tartrate (LOPRESSOR) 100 MG tablet, Take 1 tablet (100 mg total) by mouth 2 (two) times daily., Disp: 180 tablet, Rfl: 3   montelukast (SINGULAIR) 10 MG tablet, Take 1 tablet (10 mg total) by mouth at bedtime., Disp: 90 tablet, Rfl: 3   NONFORMULARY OR COMPOUNDED ITEM, Please dispense a blood pressure cuff to patient if approved through insurance., Disp: 1 each, Rfl: 1   nystatin cream (MYCOSTATIN), Apply 1 application topically 2 (two) times daily. (Patient not taking: Reported on 04/11/2022), Disp: 30 g, Rfl: 0   olmesartan (BENICAR) 40 MG tablet, Take 1 tablet (40 mg total) by mouth daily., Disp: 90 tablet, Rfl: 3   omeprazole (PRILOSEC) 20 MG capsule, Take 1 capsule (20 mg total) by mouth daily., Disp: 90 capsule, Rfl: 3   Propylene Glycol-Glycerin (MOISTURE EYES OP), Apply to eye as needed., Disp: , Rfl:    rosuvastatin (CRESTOR) 40 MG tablet, Take 1 tablet (40 mg total) by mouth daily., Disp: 90 tablet, Rfl: 1   vitamin B-12 (CYANOCOBALAMIN) 1000 MCG tablet, Take 1,000 mcg by mouth daily., Disp: , Rfl:    No Known Allergies  Past Medical History:  Diagnosis Date   Allergic rhinitis    Allergy    seasonal   Anxiety    Asthma, intrinsic    Cataract    bilateral repair   Depression    Frequency of urination    GERD (gastroesophageal reflux disease)    Hyperlipidemia    Hypertension  Kidney stone    Left kidney-calcium    OSA on CPAP    PT IN PROCESS OF GETTING MACHINE SET UP   Sleep apnea    cpap   Thyroid goiter    Urgency of urination    Wears glasses      Past Surgical History:  Procedure Laterality Date   BREAST BIOPSY     CATARACT EXTRACTION, BILATERAL     COLONOSCOPY  2013   CYSTOSCOPY WITH RETROGRADE PYELOGRAM, URETEROSCOPY AND STENT PLACEMENT Left 06/21/2015   Procedure: CYSTOSCOPY WITH RETROGRADE PYELOGRAM, URETEROSCOPY AND STENT PLACEMENT;  Surgeon: Nickie Retort, MD;  Location: [facility];  Service: Urology;  Laterality: Left;    DILATION AND CURETTAGE OF UTERUS  1990   W/ HYSTEROSCOPY   EYE SURGERY     retina both eyes 6/19   HOLMIUM LASER APPLICATION Left 23/01/5731   Procedure: HOLMIUM LASER APPLICATION;  Surgeon: Nickie Retort, MD;  Location: [facility];  Service: Urology;  Laterality: Left;   STONE EXTRACTION WITH BASKET Left 06/21/2015   Procedure: STONE EXTRACTION WITH BASKET;  Surgeon: Nickie Retort, MD;  Location: [facility];  Service: Urology;  Laterality: Left;   TUBAL LIGATION  64's    Family History  Problem Relation Age of Onset   Hypertension Mother    Heart disease Mother    Stroke Mother    Heart attack Mother    COPD Father    Asthma Father    Stroke Brother    Hypertension Brother    Hypertension Brother    Kidney cancer Maternal Aunt    Heart attack Maternal Aunt    Breast cancer Cousin    Breast cancer Cousin    Hyperlipidemia Other    Hypertension Other    Colon cancer Neg Hx    Colon polyps Neg Hx    Esophageal cancer Neg Hx    Rectal cancer Neg Hx    Stomach cancer Neg Hx     Social History   Tobacco Use   Smoking status: Never   Smokeless tobacco: Never  Vaping Use   Vaping Use: Never used  Substance Use Topics   Alcohol use: Not Currently    Alcohol/week: 0.0 standard drinks of alcohol   Drug use: No    ROS   Objective:   Vitals: BP 127/71   Pulse 75   Temp 98.8 F (37.1 C)   Resp 18   SpO2 95%   Physical Exam Constitutional:      General: She is not in acute distress.    Appearance: Normal appearance. She is well-developed. She is not ill-appearing, toxic-appearing or diaphoretic.  HENT:     Head: Normocephalic and atraumatic.     Nose: Nose normal.     Mouth/Throat:     Mouth: Mucous membranes are moist.  Eyes:     General: No scleral icterus.       Right eye: No discharge.        Left eye: No discharge.     Extraocular Movements: Extraocular movements intact.  Cardiovascular:     Rate and  Rhythm: Normal rate and regular rhythm.     Heart sounds: Normal heart sounds. No murmur heard.    No friction rub. No gallop.  Pulmonary:     Effort: Pulmonary effort is normal. No respiratory distress.     Breath sounds: No stridor. No wheezing, rhonchi or rales.  Chest:     Chest wall:  No tenderness.  Skin:    General: Skin is warm and dry.  Neurological:     General: No focal deficit present.     Mental Status: She is alert and oriented to person, place, and time.  Psychiatric:        Mood and Affect: Mood normal.        Behavior: Behavior normal.     Assessment and Plan :   PDMP not reviewed this encounter.  1. Clinical diagnosis of COVID-19   2. Decreased GFR   3. Intrinsic asthma   4. Allergic rhinitis, unspecified seasonality, unspecified trigger   5. Essential hypertension    Deferred imaging given clear cardiopulmonary exam, hemodynamically stable vital signs.  Recommended using molnupiravir given her decreased GFR lower/at 64m/min. Will manage COVID-19 with supportive care otherwise. Counseled patient on nature of COVID-19 including modes of transmission, diagnostic testing, management and supportive care.  Offered symptomatic relief. Counseled patient on potential for adverse effects with medications prescribed/recommended today, strict ER and return-to-clinic precautions discussed, patient verbalized understanding.     MJaynee Eagles PV[place]09/16/23 [phone]

## 2022-06-04 LAB — SARS CORONAVIRUS 2 (TAT 6-24 HRS): SARS Coronavirus 2: POSITIVE — AB

## 2022-06-08 ENCOUNTER — Other Ambulatory Visit: Payer: Self-pay | Admitting: Family Medicine

## 2022-06-08 DIAGNOSIS — J302 Other seasonal allergic rhinitis: Secondary | ICD-10-CM

## 2022-06-13 ENCOUNTER — Other Ambulatory Visit: Payer: Medicare Other

## 2022-06-14 DIAGNOSIS — G4733 Obstructive sleep apnea (adult) (pediatric): Secondary | ICD-10-CM | POA: Diagnosis not present

## 2022-06-20 DIAGNOSIS — G4733 Obstructive sleep apnea (adult) (pediatric): Secondary | ICD-10-CM | POA: Diagnosis not present

## 2022-07-12 NOTE — Progress Notes (Signed)
04/11/22- 67 yoF married, never smoker for sleep evaluation HST 01/27/13- AHI 17.6/ hr, desaturation to 87%, body weight 191 lbs  CPAP to 10 Had CPAP from [place], managed by Dr Gwenette Greet, then Dr Elsworth Soho. LOV around 2017. Medical problem list includes HTN, Allergic Rhinitis, Asthma, GERD, Goiter/ Hypothyroid, Anxiety/ Depression, Hyperlipidemia, Insomnia,  Epworth score-10 Body weight today- 187 lbs -----Pt consult saw [facility] in 2017 for OSA, here to reestablish care and get new CPAP. Getting approx 4hr/night, waking up feeling tired. Currently not using machine because of electrical issues.  Download from 2018 with CPAP 10 showed compliance 90%, AHI 3.7/ hr. Says she was doing well, regularly using CPAP until it "burned out" 2 weeks ago. Denies ENT surgery or heart disease. Asthma is managed by [facility]. Father and son both Have had OSA.  07/13/22- 38 yoF married followed for OSA, complicated by HTN, CKD, Allergic Rhinitis, Asthma, GERD, Goiter/ Hypothyroid, Anxiety/ Depression, Hyperlipidemia, Insomnia, Covid infection Sept2023, HST 01/27/13- AHI 17.6/ hr, desaturation to 87%, body weight 191 lbs  CPAP to 10 Had CPAP from [place], managed by Dr Gwenette Greet, then Dr Elsworth Soho. LOV around 2017. CPAP auto 5-15/ [facility]  replacement order 04/11/22 Download compliance- not available Body weight today- 186 lbs Covid vax-5 Phizer Flu vax-had ED 06/03/22 Covid infection> molnupiravir chosen due to low GFR 30/ Creat 1.56, She likes her replacement machine and is doing well.  We are asking [facility] to install Airview so we can download. Is noticing some mild wheeze now is fall weather comes in.  We will refill inhalers as discussed.  ROS-see HPI + = positive Constitutional:    weight loss, night sweats, fevers, chills, fatigue, lassitude. HEENT:    headaches, difficulty swallowing, +tooth/dental problems, sore throat,       sneezing,+ itching, ear ache, +nasal congestion, post nasal drip, snoring CV:    chest  pain, orthopnea, PND, swelling in lower extremities, anasarca,                  dizziness, palpitations Resp:   +shortness of breath with exertion or at rest.                productive cough,   non-productive cough, coughing up of blood.              change in color of mucus.  wheezing.   Skin:    rash or lesions. GI:  +heartburn, indigestion, abdominal pain, nausea, vomiting, diarrhea,                 change in bowel habits, loss of appetite GU: dysuria, change in color of urine, no urgency or frequency.   flank pain. MS:   joint pain, stiffness, decreased range of motion, back pain. Neuro-     nothing unusual Psych:  change in mood or affect.  +depression or +anxiety.   memory loss.  OBJ- Physical Exam General- Alert, Oriented, Affect-appropriate, Distress- none acute Skin- rash-none, lesions- none, excoriation- none Lymphadenopathy- none Head- atraumatic            Eyes- Gross vision intact, PERRLA, conjunctivae and secretions clear            Ears- Hearing, canals-normal            Nose- Clear, no-Septal dev, mucus, polyps, erosion, perforation             Throat- Mallampati III-IV , mucosa clear , drainage- none, tonsils+, +teeth Neck- flexible , trachea midline, no stridor , thyroid nl,  carotid no bruit Chest - symmetrical excursion , unlabored           Heart/CV- RRR , +syst murmur , no gallop  , no rub, nl s1 s2                           - JVD- none , edema- none, stasis changes- none, varices- none           Lung- clear to P&A, wheeze- none, cough- none , dullness-none, rub- none           Chest wall-  Abd-  Br/ Gen/ Rectal- Not done, not indicated Extrem- cyanosis- none, clubbing, none, atrophy- none, strength- nl Neuro- grossly intact to observation

## 2022-07-13 ENCOUNTER — Telehealth: Payer: Self-pay

## 2022-07-13 ENCOUNTER — Other Ambulatory Visit: Payer: Medicare Other

## 2022-07-13 ENCOUNTER — Encounter: Payer: Self-pay | Admitting: Internal Medicine

## 2022-07-13 ENCOUNTER — Ambulatory Visit: Payer: Medicare Other | Admitting: Internal Medicine

## 2022-07-13 DIAGNOSIS — G4733 Obstructive sleep apnea (adult) (pediatric): Secondary | ICD-10-CM

## 2022-07-13 DIAGNOSIS — Z8709 Personal history of other diseases of the respiratory system: Secondary | ICD-10-CM

## 2022-07-13 DIAGNOSIS — J45909 Unspecified asthma, uncomplicated: Secondary | ICD-10-CM

## 2022-07-13 MED ORDER — ALBUTEROL SULFATE HFA 108 (90 BASE) MCG/ACT IN AERS: INHALATION_SPRAY | RESPIRATORY_TRACT | 3 refills | Status: DC

## 2022-07-13 MED ORDER — FLUTICASONE FUROATE-VILANTEROL 200-25 MCG/ACT IN AEPB: INHALATION_SPRAY | RESPIRATORY_TRACT | 3 refills | Status: DC

## 2022-07-13 NOTE — Patient Instructions (Signed)
Order- DME [facility] Please install AirView. Continue auto 5-15  Refill sent for Breo and albuterol

## 2022-07-14 ENCOUNTER — Encounter: Payer: Self-pay | Admitting: Internal Medicine

## 2022-07-14 NOTE — Assessment & Plan Note (Signed)
Benefits from CPAP. Plan-[facility] to install Airview.  Continue auto 5-15

## 2022-07-14 NOTE — Assessment & Plan Note (Signed)
Noting mild wheeze more often with weather change.  Not acutely ill. Plan-refill Breo and albuterol inhalers

## 2022-07-18 ENCOUNTER — Other Ambulatory Visit: Payer: Medicare Other

## 2022-07-21 DIAGNOSIS — G4733 Obstructive sleep apnea (adult) (pediatric): Secondary | ICD-10-CM | POA: Diagnosis not present

## 2022-08-14 ENCOUNTER — Telehealth: Payer: Self-pay | Admitting: Family Medicine

## 2022-08-14 NOTE — Telephone Encounter (Signed)
Noted  

## 2022-08-14 NOTE — Telephone Encounter (Signed)
Nurse Assessment Nurse: Virgilio Frees, RN, Eliezer Lofts Date/Time (Eastern Time): 08/14/2022 2:23:16 PM Confirm and document reason for call. If symptomatic, describe symptoms. ---Caller states she has asthma and is having SOB after raking leaves. She's used her inhaler and it's not working. Does the patient have any new or worsening symptoms? ---Yes Will a triage be completed? ---Yes Related visit to physician within the last 2 weeks? ---N/A Does the PT have any chronic conditions? (i.e. diabetes, asthma, this includes High risk factors for pregnancy, etc.) ---Yes List chronic conditions. ---Asthma Is this a behavioral health or substance abuse call? ---No Guidelines Guideline Title Affirmed Question Affirmed Notes Nurse Date/Time (Eastern Time) Asthma Attack SEVERE asthma attack (e.g., struggling for each breath, speaks in single words, loud wheezes, pulse >120) (RED Zone) Zulliger, RN, Eliezer Lofts 08/14/2022 2:24:25 PM PLEASE NOTE: All timestamps contained within this report are represented as [place] Standard Time. CONFIDENTIALTY NOTICE: This fax transmission is intended only for the addressee. It contains information that is legally privileged, confidential or otherwise protected from use or disclosure. If you are not the intended recipient, you are strictly prohibited from reviewing, disclosing, copying using or disseminating any of this information or taking any action in reliance on or regarding this information. If you have received this fax in error, please notify us immediately by telephone so that we can arrange for its return to [place]. Phone: [phone], Toll-Free: [phone], Fax: [phone] Page: 2 of 2 Call Id: [id number] [place]. Time Eilene Ghazi Time) Disposition Final User 08/14/2022 2:21:33 PM Send to Urgent Queue Benetta Spar 08/14/2022 2:26:35 PM Call EMS 911 Now Yes Zulliger, RN, Eliezer Lofts 08/14/2022 2:27:38 PM 911 Outcome Documentation Zulliger, RN, Eliezer Lofts Reason: Caller  states she was not going to call 911 but rather go to [facility]. Final Disposition 08/14/2022 2:26:35 PM Call EMS 911 Now Yes Zulliger, RN, Jonelle Sports Disagree/Comply Disagree Caller Understands Yes PreDisposition Call Doctor Care Advice Given Per Guideline CALL EMS 911 NOW: * Immediate medical attention is needed. You need to hang up and call 911 (or an ambulance). CARE ADVICE given per Asthma Attack (Adult) guideline. Referrals GO TO FACILITY UNDECIDED

## 2022-08-14 NOTE — Telephone Encounter (Signed)
Pt called stating she was experiencing the following symptoms:  -SOB   -Pt does have history of asthma and her medication is not helping  Pt was transferred to triage nurse for further eval.

## 2022-08-17 ENCOUNTER — Other Ambulatory Visit (INDEPENDENT_AMBULATORY_CARE_PROVIDER_SITE_OTHER): Payer: Medicare Other

## 2022-08-17 DIAGNOSIS — D582 Other hemoglobinopathies: Secondary | ICD-10-CM | POA: Diagnosis not present

## 2022-08-17 LAB — CBC WITH DIFFERENTIAL/PLATELET
Basophils Absolute: 0 10*3/uL (ref 0.0–0.1)
Basophils Relative: 0.7 % (ref 0.0–3.0)
Eosinophils Absolute: 0.4 10*3/uL (ref 0.0–0.7)
Eosinophils Relative: 6 % — ABNORMAL HIGH (ref 0.0–5.0)
HCT: 36.3 % (ref 36.0–46.0)
Hemoglobin: 11.7 g/dL — ABNORMAL LOW (ref 12.0–15.0)
Lymphocytes Relative: 19.2 % (ref 12.0–46.0)
Lymphs Abs: 1.4 10*3/uL (ref 0.7–4.0)
MCHC: 32.1 g/dL (ref 30.0–36.0)
MCV: 84 fl (ref 78.0–100.0)
Monocytes Absolute: 0.6 10*3/uL (ref 0.1–1.0)
Monocytes Relative: 8.2 % (ref 3.0–12.0)
Neutro Abs: 4.7 10*3/uL (ref 1.4–7.7)
Neutrophils Relative %: 65.9 % (ref 43.0–77.0)
Platelets: 321 10*3/uL (ref 150.0–400.0)
RBC: 4.32 Mil/uL (ref 3.87–5.11)
RDW: 14.8 % (ref 11.5–15.5)
WBC: 7.1 10*3/uL (ref 4.0–10.5)

## 2022-08-20 DIAGNOSIS — G4733 Obstructive sleep apnea (adult) (pediatric): Secondary | ICD-10-CM | POA: Diagnosis not present

## 2022-08-21 ENCOUNTER — Ambulatory Visit
Admission: EM | Admit: 2022-08-21 | Discharge: 2022-08-21 | Disposition: A | Discharge status: Home / Self Care | Payer: Medicare Other | Attending: Urgent Care | Admitting: Urgent Care

## 2022-08-21 ENCOUNTER — Ambulatory Visit (INDEPENDENT_AMBULATORY_CARE_PROVIDER_SITE_OTHER): Payer: Medicare Other

## 2022-08-21 DIAGNOSIS — R059 Cough, unspecified: Secondary | ICD-10-CM | POA: Diagnosis not present

## 2022-08-21 DIAGNOSIS — J309 Allergic rhinitis, unspecified: Secondary | ICD-10-CM | POA: Diagnosis not present

## 2022-08-21 DIAGNOSIS — J45909 Unspecified asthma, uncomplicated: Secondary | ICD-10-CM

## 2022-08-21 DIAGNOSIS — B349 Viral infection, unspecified: Secondary | ICD-10-CM

## 2022-08-21 DIAGNOSIS — N289 Disorder of kidney and ureter, unspecified: Secondary | ICD-10-CM | POA: Diagnosis not present

## 2022-08-21 DIAGNOSIS — Z8616 Personal history of COVID-19: Secondary | ICD-10-CM

## 2022-08-21 DIAGNOSIS — R0602 Shortness of breath: Secondary | ICD-10-CM | POA: Diagnosis not present

## 2022-08-21 MED ORDER — PROMETHAZINE-DM 6.25-15 MG/5ML PO SYRP: 2.5000 mL | ORAL_SOLUTION | Freq: Three times a day (TID) | ORAL | 0 refills | Status: DC | PRN

## 2022-08-21 MED ORDER — METHYLPREDNISOLONE SODIUM SUCC 125 MG IJ SOLR
125.0000 mg | Freq: Once | INTRAMUSCULAR | Status: AC
  Administered 2022-08-21: 125 mg via INTRAMUSCULAR

## 2022-08-21 NOTE — ED Triage Notes (Signed)
Pt c/o SHOB/asthma x 1.5 weeks-using inhalers-cough x 3 days-NAD-steady gait

## 2022-08-21 NOTE — ED Provider Notes (Signed)
[facility]  Note:  This document was prepared using Systems analyst and may include unintentional dictation errors.  MRN: [id number] DOB: [birth year]  Subjective:   Wendy Christensen is a 76 y.o. female presenting for 1.5 week history of acute onset persistent coughing, intermittent shortness of breath and chest tightness, legs and fatigue.  Patient test positive for COVID-06 June 2022.  Has a history of intrinsic asthma, allergic rhinitis.  Patient typically responds well to steroids, is requesting a steroid injection.   No current facility-administered medications for this encounter.  Current Outpatient Medications:    albuterol (VENTOLIN HFA) 108 (90 Base) MCG/ACT inhaler, USE 2 INHALATIONS BY MOUTH  EVERY 4 HOURS AS NEEDED FOR WHEEZING, Disp: 54 g, Rfl: 3   ALPRAZolam (XANAX) 0.25 MG tablet, Take 1 tablet (0.25 mg total) by mouth 2 (two) times daily as needed for anxiety., Disp: 20 tablet, Rfl: 0   amLODipine (NORVASC) 5 MG tablet, TAKE 1 TABLET BY MOUTH  DAILY, Disp: 90 tablet, Rfl: 1   citalopram (CELEXA) 40 MG tablet, Take 1 tablet (40 mg total) by mouth every morning., Disp: 90 tablet, Rfl: 3   fenofibrate 160 MG tablet, Take 1 tablet (160 mg total) by mouth daily., Disp: 90 tablet, Rfl: 3   fluticasone (FLONASE) 50 MCG/ACT nasal spray, Place 2 sprays into both nostrils as needed., Disp: 16 g, Rfl: 3   fluticasone furoate-vilanterol (BREO ELLIPTA) 200-25 MCG/ACT AEPB, Inhale 1 puff then rinse mouth, once daily, Disp: 180 each, Rfl: 3   hydrochlorothiazide (HYDRODIURIL) 25 MG tablet, Take 1 tablet (25 mg total) by mouth daily as needed., Disp: 90 tablet, Rfl: 3   ipratropium (ATROVENT) 0.06 % nasal spray, Place 2 sprays into both nostrils 4 (four) times daily. As needed for nasal congestion, runny nose, Disp: 15 mL, Rfl: 0   loratadine (CLARITIN) 10 MG tablet, Take 1 tablet (10 mg total) by mouth daily., Disp: 90 tablet, Rfl: 3    metoprolol tartrate (LOPRESSOR) 100 MG tablet, Take 1 tablet (100 mg total) by mouth 2 (two) times daily., Disp: 180 tablet, Rfl: 3   montelukast (SINGULAIR) 10 MG tablet, Take 1 tablet (10 mg total) by mouth at bedtime., Disp: 90 tablet, Rfl: 3   NONFORMULARY OR COMPOUNDED ITEM, Please dispense a blood pressure cuff to patient if approved through insurance., Disp: 1 each, Rfl: 1   nystatin cream (MYCOSTATIN), Apply 1 application topically 2 (two) times daily., Disp: 30 g, Rfl: 0   olmesartan (BENICAR) 40 MG tablet, Take 1 tablet (40 mg total) by mouth daily., Disp: 90 tablet, Rfl: 3   omeprazole (PRILOSEC) 20 MG capsule, Take 1 capsule (20 mg total) by mouth daily., Disp: 90 capsule, Rfl: 3   Propylene Glycol-Glycerin (MOISTURE EYES OP), Apply to eye as needed., Disp: , Rfl:    rosuvastatin (CRESTOR) 40 MG tablet, Take 1 tablet (40 mg total) by mouth daily., Disp: 90 tablet, Rfl: 1   vitamin B-12 (CYANOCOBALAMIN) 1000 MCG tablet, Take 1,000 mcg by mouth daily., Disp: , Rfl:    No Known Allergies  Past Medical History:  Diagnosis Date   Allergic rhinitis    Allergy    seasonal   Anxiety    Asthma, intrinsic    Cataract    bilateral repair   Depression    Frequency of urination    GERD (gastroesophageal reflux disease)    Hyperlipidemia    Hypertension    Kidney stone    Left kidney-calcium  OSA on CPAP    PT IN PROCESS OF GETTING MACHINE SET UP   Sleep apnea    cpap   Thyroid goiter    Urgency of urination    Wears glasses      Past Surgical History:  Procedure Laterality Date   BREAST BIOPSY     CATARACT EXTRACTION, BILATERAL     COLONOSCOPY  2013   CYSTOSCOPY WITH RETROGRADE PYELOGRAM, URETEROSCOPY AND STENT PLACEMENT Left 06/21/2015   Procedure: CYSTOSCOPY WITH RETROGRADE PYELOGRAM, URETEROSCOPY AND STENT PLACEMENT;  Surgeon: Nickie Retort, MD;  Location: [facility];  Service: Urology;  Laterality: Left;   DILATION AND CURETTAGE OF UTERUS  1990    W/ HYSTEROSCOPY   EYE SURGERY     retina both eyes 6/19   HOLMIUM LASER APPLICATION Left 29/03/9891   Procedure: HOLMIUM LASER APPLICATION;  Surgeon: Nickie Retort, MD;  Location: [facility];  Service: Urology;  Laterality: Left;   STONE EXTRACTION WITH BASKET Left 06/21/2015   Procedure: STONE EXTRACTION WITH BASKET;  Surgeon: Nickie Retort, MD;  Location: [facility];  Service: Urology;  Laterality: Left;   TUBAL LIGATION  64's    Family History  Problem Relation Age of Onset   Hypertension Mother    Heart disease Mother    Stroke Mother    Heart attack Mother    COPD Father    Asthma Father    Stroke Brother    Hypertension Brother    Hypertension Brother    Kidney cancer Maternal Aunt    Heart attack Maternal Aunt    Breast cancer Cousin    Breast cancer Cousin    Hyperlipidemia Other    Hypertension Other    Colon cancer Neg Hx    Colon polyps Neg Hx    Esophageal cancer Neg Hx    Rectal cancer Neg Hx    Stomach cancer Neg Hx     Social History   Tobacco Use   Smoking status: Never   Smokeless tobacco: Never  Vaping Use   Vaping Use: Never used  Substance Use Topics   Alcohol use: Not Currently    Alcohol/week: 0.0 standard drinks of alcohol   Drug use: No    ROS   Objective:   Vitals: BP (!) 149/82 (BP Location: Right Arm)   Pulse 68   Temp 98.3 F (36.8 C) (Oral)   Resp (!) 21   SpO2 95%   Physical Exam Constitutional:      General: She is not in acute distress.    Appearance: Normal appearance. She is well-developed. She is not ill-appearing, toxic-appearing or diaphoretic.  HENT:     Head: Normocephalic and atraumatic.     Nose: Nose normal.     Mouth/Throat:     Mouth: Mucous membranes are moist.  Eyes:     General: No scleral icterus.       Right eye: No discharge.        Left eye: No discharge.     Extraocular Movements: Extraocular movements intact.  Cardiovascular:     Rate and Rhythm:  Normal rate and regular rhythm.     Heart sounds: Normal heart sounds. No murmur heard.    No friction rub. No gallop.  Pulmonary:     Effort: Pulmonary effort is normal. No respiratory distress.     Breath sounds: No stridor. No wheezing, rhonchi or rales.  Chest:     Chest wall: No tenderness.  Skin:    General: Skin is warm and dry.  Neurological:     General: No focal deficit present.     Mental Status: She is alert and oriented to person, place, and time.  Psychiatric:        Mood and Affect: Mood normal.        Behavior: Behavior normal.    DG Chest 2 View  Result Date: 08/21/2022 CLINICAL DATA:  Shortness of breath.  Asthma.  Cough for 3 days. EXAM: CHEST - 2 VIEW COMPARISON:  Radiographs 02/18/2016 and 01/31/2013. FINDINGS: The heart size and mediastinal contours are stable. Stable mild biapical scarring. The lungs are otherwise clear. No pneumothorax or significant pleural effusion. The bones appear unremarkable. IMPRESSION: No active cardiopulmonary process. Stable mild biapical scarring. Electronically Signed   By: Richardean Sale M.D.   On: 08/21/2022 09:52     Assessment and Plan :   PDMP not reviewed this encounter.  1. Acute viral syndrome   2. Intrinsic asthma   3. Allergic rhinitis, unspecified seasonality, unspecified trigger   4. History of COVID-19   5. Renal insufficiency     Chest x-ray is negative.  Deferred respiratory testing given timeline of her illness and negative chest x-ray.  IM Solu-Medrol given in clinic at 125 mg.  Recommended maintaining her regular medications for her asthma and allergic rhinitis.  Use supportive care otherwise for an acute viral syndrome. Counseled patient on potential for adverse effects with medications prescribed/recommended today, ER and return-to-clinic precautions discussed, patient verbalized understanding.    Jaynee Eagles, PA-C 08/21/22 1010

## 2022-09-08 ENCOUNTER — Other Ambulatory Visit: Payer: Self-pay | Admitting: Family Medicine

## 2022-09-08 DIAGNOSIS — Z1231 Encounter for screening mammogram for malignant neoplasm of breast: Secondary | ICD-10-CM

## 2022-09-14 DIAGNOSIS — G4733 Obstructive sleep apnea (adult) (pediatric): Secondary | ICD-10-CM | POA: Diagnosis not present

## 2022-09-20 DIAGNOSIS — G4733 Obstructive sleep apnea (adult) (pediatric): Secondary | ICD-10-CM | POA: Diagnosis not present

## 2022-10-11 ENCOUNTER — Ambulatory Visit
Admission: EM | Admit: 2022-10-11 | Discharge: 2022-10-11 | Disposition: A | Discharge status: Home / Self Care | Payer: Medicare Other | Attending: Urgent Care | Admitting: Urgent Care

## 2022-10-11 DIAGNOSIS — A084 Viral intestinal infection, unspecified: Secondary | ICD-10-CM

## 2022-10-11 DIAGNOSIS — R110 Nausea: Secondary | ICD-10-CM | POA: Diagnosis not present

## 2022-10-11 MED ORDER — ONDANSETRON 8 MG PO TBDP: 8.0000 mg | ORAL_TABLET | Freq: Three times a day (TID) | ORAL | 0 refills | Status: DC | PRN

## 2022-10-11 NOTE — ED Provider Notes (Signed)
[facility]  Note:  This document was prepared using Systems analyst and may include unintentional dictation errors.  MRN: [id number] DOB: 1946/04/08  Subjective:   Wendy Christensen is a 77 y.o. female presenting for 10 day history of acute onset persistent and improving nausea, vomiting, diarrhea. Works with children, has had multiple sick contacts.  Reports that the vomiting and diarrhea are significantly improved but she is still having a hard time with nausea. No fever, bloody stools, recent antibiotic use, hospitalizations or long distance travel.  Has not eaten raw foods, drank unfiltered water.  No history of GI disorders including Crohn's, IBS, ulcerative colitis.   No current facility-administered medications for this encounter.  Current Outpatient Medications:    albuterol (VENTOLIN HFA) 108 (90 Base) MCG/ACT inhaler, USE 2 INHALATIONS BY MOUTH  EVERY 4 HOURS AS NEEDED FOR WHEEZING, Disp: 54 g, Rfl: 3   ALPRAZolam (XANAX) 0.25 MG tablet, Take 1 tablet (0.25 mg total) by mouth 2 (two) times daily as needed for anxiety., Disp: 20 tablet, Rfl: 0   amLODipine (NORVASC) 5 MG tablet, TAKE 1 TABLET BY MOUTH  DAILY, Disp: 90 tablet, Rfl: 1   citalopram (CELEXA) 40 MG tablet, Take 1 tablet (40 mg total) by mouth every morning., Disp: 90 tablet, Rfl: 3   fenofibrate 160 MG tablet, Take 1 tablet (160 mg total) by mouth daily., Disp: 90 tablet, Rfl: 3   fluticasone (FLONASE) 50 MCG/ACT nasal spray, Place 2 sprays into both nostrils as needed., Disp: 16 g, Rfl: 3   fluticasone furoate-vilanterol (BREO ELLIPTA) 200-25 MCG/ACT AEPB, Inhale 1 puff then rinse mouth, once daily, Disp: 180 each, Rfl: 3   hydrochlorothiazide (HYDRODIURIL) 25 MG tablet, Take 1 tablet (25 mg total) by mouth daily as needed., Disp: 90 tablet, Rfl: 3   ipratropium (ATROVENT) 0.06 % nasal spray, Place 2 sprays into both nostrils 4 (four) times daily. As needed for nasal  congestion, runny nose, Disp: 15 mL, Rfl: 0   loratadine (CLARITIN) 10 MG tablet, Take 1 tablet (10 mg total) by mouth daily., Disp: 90 tablet, Rfl: 3   metoprolol tartrate (LOPRESSOR) 100 MG tablet, Take 1 tablet (100 mg total) by mouth 2 (two) times daily., Disp: 180 tablet, Rfl: 3   montelukast (SINGULAIR) 10 MG tablet, Take 1 tablet (10 mg total) by mouth at bedtime., Disp: 90 tablet, Rfl: 3   NONFORMULARY OR COMPOUNDED ITEM, Please dispense a blood pressure cuff to patient if approved through insurance., Disp: 1 each, Rfl: 1   nystatin cream (MYCOSTATIN), Apply 1 application topically 2 (two) times daily., Disp: 30 g, Rfl: 0   olmesartan (BENICAR) 40 MG tablet, Take 1 tablet (40 mg total) by mouth daily., Disp: 90 tablet, Rfl: 3   omeprazole (PRILOSEC) 20 MG capsule, Take 1 capsule (20 mg total) by mouth daily., Disp: 90 capsule, Rfl: 3   promethazine-dextromethorphan (PROMETHAZINE-DM) 6.25-15 MG/5ML syrup, Take 2.5 mLs by mouth 3 (three) times daily as needed for cough., Disp: 100 mL, Rfl: 0   Propylene Glycol-Glycerin (MOISTURE EYES OP), Apply to eye as needed., Disp: , Rfl:    rosuvastatin (CRESTOR) 40 MG tablet, Take 1 tablet (40 mg total) by mouth daily., Disp: 90 tablet, Rfl: 1   vitamin B-12 (CYANOCOBALAMIN) 1000 MCG tablet, Take 1,000 mcg by mouth daily., Disp: , Rfl:    No Known Allergies  Past Medical History:  Diagnosis Date   Allergic rhinitis    Allergy    seasonal  Anxiety    Asthma, intrinsic    Cataract    bilateral repair   Depression    Frequency of urination    GERD (gastroesophageal reflux disease)    Hyperlipidemia    Hypertension    Kidney stone    Left kidney-calcium    OSA on CPAP    PT IN PROCESS OF GETTING MACHINE SET UP   Sleep apnea    cpap   Thyroid goiter    Urgency of urination    Wears glasses      Past Surgical History:  Procedure Laterality Date   BREAST BIOPSY     CATARACT EXTRACTION, BILATERAL     COLONOSCOPY  2013   CYSTOSCOPY  WITH RETROGRADE PYELOGRAM, URETEROSCOPY AND STENT PLACEMENT Left 06/21/2015   Procedure: CYSTOSCOPY WITH RETROGRADE PYELOGRAM, URETEROSCOPY AND STENT PLACEMENT;  Surgeon: Nickie Retort, MD;  Location: [facility];  Service: Urology;  Laterality: Left;   DILATION AND CURETTAGE OF UTERUS  1990   W/ HYSTEROSCOPY   EYE SURGERY     retina both eyes 6/19   HOLMIUM LASER APPLICATION Left 78/10/9560   Procedure: HOLMIUM LASER APPLICATION;  Surgeon: Nickie Retort, MD;  Location: [facility];  Service: Urology;  Laterality: Left;   STONE EXTRACTION WITH BASKET Left 06/21/2015   Procedure: STONE EXTRACTION WITH BASKET;  Surgeon: Nickie Retort, MD;  Location: [facility];  Service: Urology;  Laterality: Left;   TUBAL LIGATION  86's    Family History  Problem Relation Age of Onset   Hypertension Mother    Heart disease Mother    Stroke Mother    Heart attack Mother    COPD Father    Asthma Father    Stroke Brother    Hypertension Brother    Hypertension Brother    Kidney cancer Maternal Aunt    Heart attack Maternal Aunt    Breast cancer Cousin    Breast cancer Cousin    Hyperlipidemia Other    Hypertension Other    Colon cancer Neg Hx    Colon polyps Neg Hx    Esophageal cancer Neg Hx    Rectal cancer Neg Hx    Stomach cancer Neg Hx     Social History   Tobacco Use   Smoking status: Never   Smokeless tobacco: Never  Vaping Use   Vaping Use: Never used  Substance Use Topics   Alcohol use: Not Currently    Alcohol/week: 0.0 standard drinks of alcohol   Drug use: No    ROS   Objective:   Vitals: BP (!) 168/77 (BP Location: Right Arm)   Pulse 67   Temp 98.7 F (37.1 C) (Oral)   Resp 20   SpO2 96%   Physical Exam Constitutional:      General: She is not in acute distress.    Appearance: Normal appearance. She is well-developed. She is not ill-appearing, toxic-appearing or diaphoretic.  HENT:     Head:  Normocephalic and atraumatic.     Nose: Nose normal.     Mouth/Throat:     Mouth: Mucous membranes are moist.  Eyes:     General: No scleral icterus.       Right eye: No discharge.        Left eye: No discharge.     Extraocular Movements: Extraocular movements intact.     Conjunctiva/sclera: Conjunctivae normal.  Cardiovascular:     Rate and Rhythm: Normal rate.  Pulmonary:  Effort: Pulmonary effort is normal.  Abdominal:     General: Bowel sounds are normal. There is no distension.     Palpations: Abdomen is soft. There is no mass.     Tenderness: There is no abdominal tenderness. There is no right CVA tenderness, left CVA tenderness, guarding or rebound.  Skin:    General: Skin is warm and dry.  Neurological:     General: No focal deficit present.     Mental Status: She is alert and oriented to person, place, and time.  Psychiatric:        Mood and Affect: Mood normal.        Behavior: Behavior normal.        Thought Content: Thought content normal.        Judgment: Judgment normal.     Assessment and Plan :   PDMP not reviewed this encounter.  1. Viral gastroenteritis   2. Nausea without vomiting     Creatinine clearance at 23m/min. Will manage for suspected viral gastroenteritis with supportive care.  Recommended patient hydrate well, eat light meals and maintain electrolytes.  Will use Zofran and Imodium for nausea, vomiting and diarrhea.  No signs of acute abdomen.  Patient is improving and therefore expect a good outcome.  Counseled patient on potential for adverse effects with medications prescribed/recommended today, ER and return-to-clinic precautions discussed, patient verbalized understanding.    MJaynee Eagles PA-C 10/11/22 1210

## 2022-10-11 NOTE — Discharge Instructions (Signed)
Make sure you push fluids drinking mostly water but mix it with Gatorade.  Try to eat light meals including soups, broths and soft foods, fruits.  You may use Zofran for your nausea and vomiting once every 8 hours.   Please return to the clinic if symptoms worsen or you start having severe abdominal pain not helped by taking Tylenol or start having bloody stools or blood in the vomit.

## 2022-10-11 NOTE — ED Triage Notes (Signed)
Pt c/o nausea x 2 weeks-diarrhea 1 week ago/none x 1 week-NAD-steady gait

## 2022-10-12 NOTE — Telephone Encounter (Signed)
NFN

## 2022-10-21 DIAGNOSIS — G4733 Obstructive sleep apnea (adult) (pediatric): Secondary | ICD-10-CM | POA: Diagnosis not present

## 2022-11-02 ENCOUNTER — Other Ambulatory Visit: Payer: Self-pay | Admitting: Family Medicine

## 2022-11-02 DIAGNOSIS — I10 Essential (primary) hypertension: Secondary | ICD-10-CM

## 2022-11-03 ENCOUNTER — Ambulatory Visit: Payer: Medicare Other

## 2022-11-03 DIAGNOSIS — N183 Chronic kidney disease, stage 3 unspecified: Secondary | ICD-10-CM | POA: Diagnosis not present

## 2022-11-03 DIAGNOSIS — N200 Calculus of kidney: Secondary | ICD-10-CM | POA: Diagnosis not present

## 2022-11-03 DIAGNOSIS — I129 Hypertensive chronic kidney disease with stage 1 through stage 4 chronic kidney disease, or unspecified chronic kidney disease: Secondary | ICD-10-CM | POA: Diagnosis not present

## 2022-11-06 ENCOUNTER — Ambulatory Visit (INDEPENDENT_AMBULATORY_CARE_PROVIDER_SITE_OTHER): Payer: Medicare Other | Admitting: Family Medicine

## 2022-11-06 ENCOUNTER — Encounter: Payer: Self-pay | Admitting: Family Medicine

## 2022-11-06 ENCOUNTER — Ambulatory Visit: Payer: Medicare Other

## 2022-11-06 VITALS — BP 122/60 | HR 60 | Temp 98.3°F | Resp 18 | Ht 65.0 in | Wt 181.4 lb

## 2022-11-06 DIAGNOSIS — E785 Hyperlipidemia, unspecified: Secondary | ICD-10-CM | POA: Diagnosis not present

## 2022-11-06 DIAGNOSIS — K219 Gastro-esophageal reflux disease without esophagitis: Secondary | ICD-10-CM

## 2022-11-06 DIAGNOSIS — I10 Essential (primary) hypertension: Secondary | ICD-10-CM | POA: Diagnosis not present

## 2022-11-06 DIAGNOSIS — F411 Generalized anxiety disorder: Secondary | ICD-10-CM

## 2022-11-06 LAB — CBC WITH DIFFERENTIAL/PLATELET
Basophils Absolute: 0 10*3/uL (ref 0.0–0.1)
Basophils Relative: 0.3 % (ref 0.0–3.0)
Eosinophils Absolute: 0.3 10*3/uL (ref 0.0–0.7)
Eosinophils Relative: 4.9 % (ref 0.0–5.0)
HCT: 36.1 % (ref 36.0–46.0)
Hemoglobin: 11.8 g/dL — ABNORMAL LOW (ref 12.0–15.0)
Lymphocytes Relative: 18.7 % (ref 12.0–46.0)
Lymphs Abs: 1.1 10*3/uL (ref 0.7–4.0)
MCHC: 32.7 g/dL (ref 30.0–36.0)
MCV: 83.5 fl (ref 78.0–100.0)
Monocytes Absolute: 0.4 10*3/uL (ref 0.1–1.0)
Monocytes Relative: 7.3 % (ref 3.0–12.0)
Neutro Abs: 4.2 10*3/uL (ref 1.4–7.7)
Neutrophils Relative %: 68.8 % (ref 43.0–77.0)
Platelets: 325 10*3/uL (ref 150.0–400.0)
RBC: 4.33 Mil/uL (ref 3.87–5.11)
RDW: 13.6 % (ref 11.5–15.5)
WBC: 6.1 10*3/uL (ref 4.0–10.5)

## 2022-11-06 LAB — COMPREHENSIVE METABOLIC PANEL
ALT: 12 U/L (ref 0–35)
AST: 17 U/L (ref 0–37)
Albumin: 4.1 g/dL (ref 3.5–5.2)
Alkaline Phosphatase: 49 U/L (ref 39–117)
BUN: 20 mg/dL (ref 6–23)
CO2: 31 mEq/L (ref 19–32)
Calcium: 9.8 mg/dL (ref 8.4–10.5)
Chloride: 99 mEq/L (ref 96–112)
Creatinine, Ser: 1.39 mg/dL — ABNORMAL HIGH (ref 0.40–1.20)
GFR: 37.26 mL/min — ABNORMAL LOW (ref >60.00)
Glucose, Bld: 85 mg/dL (ref 70–99)
Potassium: 3.5 mEq/L (ref 3.5–5.1)
Sodium: 137 mEq/L (ref 135–145)
Total Bilirubin: 0.4 mg/dL (ref 0.2–1.2)
Total Protein: 7.2 g/dL (ref 6.0–8.3)

## 2022-11-06 LAB — TSH: TSH: 1.16 u[IU]/mL (ref 0.35–5.50)

## 2022-11-06 LAB — LIPID PANEL
Cholesterol: 156 mg/dL (ref 0–200)
HDL: 57.8 mg/dL (ref >39.00)
LDL Cholesterol: 88 mg/dL (ref 0–99)
NonHDL: 98.27
Total CHOL/HDL Ratio: 3
Triglycerides: 53 mg/dL (ref 0.0–149.0)
VLDL: 10.6 mg/dL (ref 0.0–40.0)

## 2022-11-06 MED ORDER — ROSUVASTATIN CALCIUM 40 MG PO TABS: 40.0000 mg | ORAL_TABLET | Freq: Every day | ORAL | 3 refills | Status: DC

## 2022-11-06 MED ORDER — OMEPRAZOLE 40 MG PO CPDR: 40.0000 mg | DELAYED_RELEASE_CAPSULE | Freq: Every day | ORAL | 3 refills | Status: DC

## 2022-11-06 MED ORDER — CITALOPRAM HYDROBROMIDE 40 MG PO TABS: 40.0000 mg | ORAL_TABLET | Freq: Every morning | ORAL | 3 refills | Status: DC

## 2022-11-06 MED ORDER — HYDROCHLOROTHIAZIDE 25 MG PO TABS: 25.0000 mg | ORAL_TABLET | Freq: Every day | ORAL | 3 refills | Status: DC | PRN

## 2022-11-06 MED ORDER — OMEPRAZOLE 20 MG PO CPDR: 20.0000 mg | DELAYED_RELEASE_CAPSULE | Freq: Every day | ORAL | 3 refills | Status: DC

## 2022-11-06 MED ORDER — FENOFIBRATE 160 MG PO TABS: 160.0000 mg | ORAL_TABLET | Freq: Every day | ORAL | 3 refills | Status: DC

## 2022-11-06 MED ORDER — METOPROLOL TARTRATE 100 MG PO TABS: 100.0000 mg | ORAL_TABLET | Freq: Two times a day (BID) | ORAL | 3 refills | Status: DC

## 2022-11-06 MED ORDER — ALPRAZOLAM 0.25 MG PO TABS: 0.2500 mg | ORAL_TABLET | Freq: Two times a day (BID) | ORAL | 0 refills | Status: DC | PRN

## 2022-11-06 MED ORDER — OLMESARTAN MEDOXOMIL 40 MG PO TABS: 40.0000 mg | ORAL_TABLET | Freq: Every day | ORAL | 3 refills | Status: DC

## 2022-11-06 NOTE — Progress Notes (Signed)
Subjective:   By signing my name below, I, Wendy Christensen, attest that this documentation has been prepared under the direction and in the presence of Wendy Held, DO 11/06/22   Patient ID: Wendy Christensen, female    DOB: Jan 01, 1946, 77 y.o.   MRN: [id number]  Chief Complaint  Patient presents with   Hypertension   Hyperlipidemia   Follow-up    HPI Patient is in today for a 6 month follow up.   She is compliant with Metoprolol 100 mg twice daily, Olmesartan 40 mg daily, Chlorthalidone 25 mg, and Hydrochlorothiazide 25 mg daily as needed.   She notes that her fluid has been being well managed. She denies any peripheral edema.   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 She had acid reflux and nausea. She has been taking Zofran for nausea.   Past Medical History:  Diagnosis Date   Allergic rhinitis    Allergy    seasonal   Anxiety    Asthma, intrinsic    Cataract    bilateral repair   Depression    Frequency of urination    GERD (gastroesophageal reflux disease)    Hyperlipidemia    Hypertension    Kidney stone    Left kidney-calcium    OSA on CPAP    PT IN PROCESS OF GETTING MACHINE SET UP   Sleep apnea    cpap   Thyroid goiter    Urgency of urination    Wears glasses     Past Surgical History:  Procedure Laterality Date   BREAST BIOPSY     CATARACT EXTRACTION, BILATERAL     COLONOSCOPY  2013   CYSTOSCOPY WITH RETROGRADE PYELOGRAM, URETEROSCOPY AND STENT PLACEMENT Left 06/21/2015   Procedure: CYSTOSCOPY WITH RETROGRADE PYELOGRAM, URETEROSCOPY AND STENT PLACEMENT;  Surgeon: Nickie Retort, MD;  Location: [facility];  Service: Urology;  Laterality: Left;   DILATION AND CURETTAGE OF UTERUS  1990   W/ HYSTEROSCOPY   EYE SURGERY     retina both eyes 6/19   HOLMIUM LASER APPLICATION Left 0000000   Procedure: HOLMIUM LASER APPLICATION;  Surgeon: Nickie Retort, MD;  Location: [facility];  Service: Urology;  Laterality: Left;   STONE EXTRACTION WITH BASKET Left 06/21/2015   Procedure: STONE EXTRACTION WITH BASKET;  Surgeon: Nickie Retort, MD;  Location: [facility];  Service: Urology;  Laterality: Left;   TUBAL LIGATION  49's    Family History  Problem Relation Age of Onset   Hypertension Mother    Heart disease Mother    Stroke Mother    Heart attack Mother    COPD Father    Asthma Father    Stroke Brother    Hypertension Brother    Hypertension Brother    Kidney cancer Maternal Aunt    Heart attack Maternal Aunt    Breast cancer Cousin    Breast cancer Cousin    Hyperlipidemia Other    Hypertension Other    Colon cancer Neg Hx    Colon polyps Neg Hx    Esophageal cancer Neg Hx    Rectal cancer Neg Hx    Stomach cancer Neg Hx     Social History   Socioeconomic History   Marital status: Married    Spouse name: Not on file   Number of children: Not on file   Years of  education: Not on file   Highest education level: Not on file  Occupational History   Occupation: Retired    Fish farm manager: DISABLE  Tobacco Use   Smoking status: Never   Smokeless tobacco: Never  Vaping Use   Vaping Use: Never used  Substance and Sexual Activity   Alcohol use: Not Currently    Alcohol/week: 0.0 standard drinks of alcohol   Drug use: No   Sexual activity: Not on file  Other Topics Concern   Not on file  Social History Narrative   Buyer, retail , daycare    Social Determinants of Health   Financial Resource Strain: Low Risk  (05/31/2020)   Overall Financial Resource Strain (CARDIA)    Difficulty of Paying Living Expenses: Not very hard  Food Insecurity: Not on file  Transportation Needs: No Transportation Needs (01/05/2021)   PRAPARE - Transportation    Lack of Transportation (Medical): No    Lack of Transportation (Non-Medical): No  Physical Activity: Insufficiently Active (01/05/2021)    Exercise Vital Sign    Days of Exercise per Week: 4 days    Minutes of Exercise per Session: 10 min  Stress: Not on file  Social Connections: Not on file  Intimate Partner Violence: Not on file    Outpatient Medications Prior to Visit  Medication Sig Dispense Refill   albuterol (VENTOLIN HFA) 108 (90 Base) MCG/ACT inhaler USE 2 INHALATIONS BY MOUTH  EVERY 4 HOURS AS NEEDED FOR WHEEZING 54 g 3   amLODipine (NORVASC) 5 MG tablet TAKE 1 TABLET BY MOUTH DAILY 90 tablet 3   chlorthalidone (HYGROTON) 25 MG tablet Take 1 tablet (25 mg total) by mouth daily. 90 tablet 1   fluticasone (FLONASE) 50 MCG/ACT nasal spray Place 2 sprays into both nostrils as needed. 16 g 3   fluticasone furoate-vilanterol (BREO ELLIPTA) 200-25 MCG/ACT AEPB Inhale 1 puff then rinse mouth, once daily 180 each 3   ipratropium (ATROVENT) 0.06 % nasal spray Place 2 sprays into both nostrils 4 (four) times daily. As needed for nasal congestion, runny nose 15 mL 0   loratadine (CLARITIN) 10 MG tablet Take 1 tablet (10 mg total) by mouth daily. 90 tablet 3   montelukast (SINGULAIR) 10 MG tablet Take 1 tablet (10 mg total) by mouth at bedtime. 90 tablet 3   NONFORMULARY OR COMPOUNDED ITEM Please dispense a blood pressure cuff to patient if approved through insurance. 1 each 1   nystatin cream (MYCOSTATIN) Apply 1 application topically 2 (two) times daily. 30 g 0   Propylene Glycol-Glycerin (MOISTURE EYES OP) Apply to eye as needed.     vitamin B-12 (CYANOCOBALAMIN) 1000 MCG tablet Take 1,000 mcg by mouth daily.     ALPRAZolam (XANAX) 0.25 MG tablet Take 1 tablet (0.25 mg total) by mouth 2 (two) times daily as needed for anxiety. 20 tablet 0   citalopram (CELEXA) 40 MG tablet Take 1 tablet (40 mg total) by mouth every morning. 90 tablet 3   fenofibrate 160 MG tablet Take 1 tablet (160 mg total) by mouth daily. 90 tablet 3   hydrochlorothiazide (HYDRODIURIL) 25 MG tablet Take 1 tablet (25 mg total) by mouth daily as needed. 90  tablet 3   metoprolol tartrate (LOPRESSOR) 100 MG tablet Take 1 tablet (100 mg total) by mouth 2 (two) times daily. 180 tablet 3   olmesartan (BENICAR) 40 MG tablet Take 1 tablet (40 mg total) by mouth daily. 90 tablet 3   omeprazole (PRILOSEC) 20 MG capsule Take 1 capsule (20  mg total) by mouth daily. 90 capsule 3   ondansetron (ZOFRAN-ODT) 8 MG disintegrating tablet Take 1 tablet (8 mg total) by mouth every 8 (eight) hours as needed for nausea or vomiting. 20 tablet 0   promethazine-dextromethorphan (PROMETHAZINE-DM) 6.25-15 MG/5ML syrup Take 2.5 mLs by mouth 3 (three) times daily as needed for cough. 100 mL 0   rosuvastatin (CRESTOR) 40 MG tablet Take 1 tablet (40 mg total) by mouth daily. 90 tablet 1   No facility-administered medications prior to visit.    No Known Allergies  Review of Systems  Constitutional:  Negative for fever and malaise/fatigue.  HENT:  Negative for congestion.   Eyes:  Negative for blurred vision.  Respiratory:  Negative for cough and shortness of breath.   Cardiovascular:  Negative for chest pain, palpitations and leg swelling.  Gastrointestinal:  Positive for nausea. Negative for vomiting.       Acid reflux  Musculoskeletal:  Negative for back pain.  Skin:  Negative for rash.  Neurological:  Negative for loss of consciousness and headaches.       Objective:    Physical Exam Vitals and nursing note reviewed.  Constitutional:      Appearance: She is well-developed.  HENT:     Head: Normocephalic and atraumatic.  Eyes:     Conjunctiva/sclera: Conjunctivae normal.  Neck:     Thyroid: No thyromegaly.     Vascular: No carotid bruit or JVD.  Cardiovascular:     Rate and Rhythm: Normal rate and regular rhythm.     Heart sounds: Normal heart sounds. No murmur heard. Pulmonary:     Effort: Pulmonary effort is normal. No respiratory distress.     Breath sounds: Normal breath sounds. No wheezing or rales.  Chest:     Chest wall: No tenderness.   Abdominal:     Palpations: Abdomen is soft.     Tenderness: There is no abdominal tenderness. There is no guarding or rebound.  Musculoskeletal:     Cervical back: Normal range of motion and neck supple.  Neurological:     Mental Status: She is alert and oriented to person, place, and time.     BP 122/60 (BP Location: Left Arm, Patient Position: Sitting, Cuff Size: Normal)   Pulse 60   Temp 98.3 F (36.8 C) (Oral)   Resp 18   Ht 5' 5"$  (1.651 m)   Wt 181 lb 6.4 oz (82.3 kg)   SpO2 95%   BMI 30.19 kg/m  Wt Readings from Last 3 Encounters:  11/06/22 181 lb 6.4 oz (82.3 kg)  07/13/22 186 lb 6.4 oz (84.6 kg)  05/04/22 185 lb 12.8 oz (84.3 kg)       Assessment & Plan:  Essential hypertension Assessment & Plan: Well controlled, no changes to meds. Encouraged heart healthy diet such as the DASH diet and exercise as tolerated.    Orders: -     hydroCHLOROthiazide; Take 1 tablet (25 mg total) by mouth daily as needed.  Dispense: 100 tablet; Refill: 3 -     Metoprolol Tartrate; Take 1 tablet (100 mg total) by mouth 2 (two) times daily.  Dispense: 210 tablet; Refill: 3 -     Olmesartan Medoxomil; Take 1 tablet (40 mg total) by mouth daily.  Dispense: 100 tablet; Refill: 3 -     CBC with Differential/Platelet -     Comprehensive metabolic panel -     Lipid panel -     TSH  Generalized anxiety disorder -  ALPRAZolam; Take 1 tablet (0.25 mg total) by mouth 2 (two) times daily as needed for anxiety.  Dispense: 20 tablet; Refill: 0 -     Citalopram Hydrobromide; Take 1 tablet (40 mg total) by mouth every morning.  Dispense: 100 tablet; Refill: 3  Hyperlipidemia LDL goal <100 Assessment & Plan: .Tolerating statin, encouraged heart healthy diet, avoid trans fats, minimize simple carbs and saturated fats. Increase exercise as tolerated   Orders: -     Fenofibrate; Take 1 tablet (160 mg total) by mouth daily.  Dispense: 100 tablet; Refill: 3 -     Rosuvastatin Calcium; Take 1  tablet (40 mg total) by mouth daily.  Dispense: 100 tablet; Refill: 3 -     CBC with Differential/Platelet -     Comprehensive metabolic panel -     Lipid panel -     TSH  Gastroesophageal reflux disease, unspecified whether esophagitis present Assessment & Plan: Increase dose to 40 mg daily Consider GI if no better  D/w pt foods to avoid  She will call or return to office if symptoms do not improve  Orders: -     Omeprazole; Take 1 capsule (40 mg total) by mouth daily. Cancel 20 mg omeprazole  Dispense: 100 capsule; Refill: 3     I,Rachel Rivera,acting as a scribe for [employer], DO.,have documented all relevant documentation on the behalf of Wendy Held, DO,as directed by  Wendy Held, DO while in the presence of Wendy Held, DO.   I, Wendy Held, DO, personally preformed the services described in this documentation.  All medical record entries made by the scribe were at my direction and in my presence.  I have reviewed the chart and discharge instructions (if applicable) and agree that the record reflects my personal performance and is accurate and complete. 11/06/22   Wendy Held, DO

## 2022-11-06 NOTE — Assessment & Plan Note (Signed)
Well controlled, no changes to meds. Encouraged heart healthy diet such as the DASH diet and exercise as tolerated.  

## 2022-11-06 NOTE — Assessment & Plan Note (Signed)
Tolerating statin, encouraged heart healthy diet, avoid trans fats, minimize simple carbs and saturated fats. Increase exercise as tolerated 

## 2022-11-06 NOTE — Patient Instructions (Signed)
Food Choices for Gastroesophageal Reflux Disease, Adult When you have gastroesophageal reflux disease (GERD), the foods you eat and your eating habits are very important. Choosing the right foods can help ease the discomfort of GERD. Consider working with a dietitian to help you make healthy food choices. What are tips for following this plan? Reading food labels Look for foods that are low in saturated fat. Foods that have less than 5% of daily value (DV) of fat and 0 g of trans fats may help with your symptoms. Cooking Cook foods using methods other than frying. This may include baking, steaming, grilling, or broiling. These are all methods that do not need a lot of fat for cooking. To add flavor, try to use herbs that are low in spice and acidity. Meal planning  Choose healthy foods that are low in fat, such as fruits, vegetables, whole grains, low-fat dairy products, lean meats, fish, and poultry. Eat frequent, small meals instead of three large meals each day. Eat your meals slowly, in a relaxed setting. Avoid bending over or lying down until 2-3 hours after eating. Limit high-fat foods such as fatty meats or fried foods. Limit your intake of fatty foods, such as oils, butter, and shortening. Avoid the following as told by your health care provider: Foods that cause symptoms. These may be different for different people. Keep a food diary to keep track of foods that cause symptoms. Alcohol. Drinking large amounts of liquid with meals. Eating meals during the 2-3 hours before bed. Lifestyle Maintain a healthy weight. Ask your health care provider what weight is healthy for you. If you need to lose weight, work with your health care provider to do so safely. Exercise for at least 30 minutes on 5 or more days each week, or as told by your health care provider. Avoid wearing clothes that fit tightly around your waist and chest. Do not use any products that contain nicotine or tobacco. These  products include cigarettes, chewing tobacco, and vaping devices, such as e-cigarettes. If you need help quitting, ask your health care provider. Sleep with the head of your bed raised. Use a wedge under the mattress or blocks under the bed frame to raise the head of the bed. Chew sugar-free gum after mealtimes. What foods should I eat?  Eat a healthy, well-balanced diet of fruits, vegetables, whole grains, low-fat dairy products, lean meats, fish, and poultry. Each person is different. Foods that may trigger symptoms in one person may not trigger any symptoms in another person. Work with your health care provider to identify foods that are safe for you. The items listed above may not be a complete list of recommended foods and beverages. Contact a dietitian for more information. What foods should I avoid? Limiting some of these foods may help manage the symptoms of GERD. Everyone is different. Consult a dietitian or your health care provider to help you identify the exact foods to avoid, if any. Fruits Any fruits prepared with added fat. Any fruits that cause symptoms. For some people this may include citrus fruits, such as oranges, grapefruit, pineapple, and lemons. Vegetables Deep-fried vegetables. French fries. Any vegetables prepared with added fat. Any vegetables that cause symptoms. For some people, this may include tomatoes and tomato products, chili peppers, onions and garlic, and horseradish. Grains Pastries or quick breads with added fat. Meats and other proteins High-fat meats, such as fatty beef or pork, hot dogs, ribs, ham, sausage, salami, and bacon. Fried meat or protein, including   fried fish and fried chicken. Nuts and nut butters, in large amounts. Dairy Whole milk and chocolate milk. Sour cream. Cream. Ice cream. Cream cheese. Milkshakes. Fats and oils Butter. Margarine. Shortening. Ghee. Beverages Coffee and tea, with or without caffeine. Carbonated beverages. Sodas. Energy  drinks. Fruit juice made with acidic fruits, such as orange or grapefruit. Tomato juice. Alcoholic drinks. Sweets and desserts Chocolate and cocoa. Donuts. Seasonings and condiments Pepper. Peppermint and spearmint. Added salt. Any condiments, herbs, or seasonings that cause symptoms. For some people, this may include curry, hot sauce, or vinegar-based salad dressings. The items listed above may not be a complete list of foods and beverages to avoid. Contact a dietitian for more information. Questions to ask your health care provider Diet and lifestyle changes are usually the first steps that are taken to manage symptoms of GERD. If diet and lifestyle changes do not improve your symptoms, talk with your health care provider about taking medicines. Where to find more information [employer]: aboutgerd.org Summary When you have gastroesophageal reflux disease (GERD), food and lifestyle choices may be very helpful in easing the discomfort of GERD. Eat frequent, small meals instead of three large meals each day. Eat your meals slowly, in a relaxed setting. Avoid bending over or lying down until 2-3 hours after eating. Limit high-fat foods such as fatty meats or fried foods. This information is not intended to replace advice given to you by your health care provider. Make sure you discuss any questions you have with your health care provider. Document Revised: 03/15/2020 Document Reviewed: 03/15/2020 Elsevier Patient Education  2023 Elsevier Inc.  

## 2022-11-06 NOTE — Assessment & Plan Note (Addendum)
Increase dose to 40 mg daily Consider GI if no better  D/w pt foods to avoid  She will call or return to office if symptoms do not improve

## 2022-11-19 DIAGNOSIS — G4733 Obstructive sleep apnea (adult) (pediatric): Secondary | ICD-10-CM | POA: Diagnosis not present

## 2022-12-11 ENCOUNTER — Ambulatory Visit
Admission: RE | Admit: 2022-12-11 | Discharge: 2022-12-11 | Disposition: A | Discharge status: Home / Self Care | Payer: Medicare Other | Source: Ambulatory Visit | Attending: Family Medicine | Admitting: Family Medicine

## 2022-12-11 DIAGNOSIS — Z1231 Encounter for screening mammogram for malignant neoplasm of breast: Secondary | ICD-10-CM

## 2022-12-12 ENCOUNTER — Ambulatory Visit: Payer: Medicare Other

## 2022-12-14 ENCOUNTER — Other Ambulatory Visit: Payer: Self-pay | Admitting: Family Medicine

## 2022-12-14 ENCOUNTER — Telehealth: Payer: Self-pay | Admitting: Family Medicine

## 2022-12-14 DIAGNOSIS — R928 Other abnormal and inconclusive findings on diagnostic imaging of breast: Secondary | ICD-10-CM

## 2022-12-14 NOTE — Telephone Encounter (Signed)
Pt would like pcp to go over mammogram results.

## 2022-12-20 DIAGNOSIS — G4733 Obstructive sleep apnea (adult) (pediatric): Secondary | ICD-10-CM | POA: Diagnosis not present

## 2022-12-20 NOTE — Telephone Encounter (Signed)
Pt called. LVM. Imaging scheduled for 04/05

## 2022-12-22 ENCOUNTER — Other Ambulatory Visit: Payer: Self-pay | Admitting: Family Medicine

## 2022-12-22 ENCOUNTER — Ambulatory Visit
Admission: RE | Admit: 2022-12-22 | Discharge: 2022-12-22 | Disposition: A | Discharge status: Home / Self Care | Payer: Medicare Other | Source: Ambulatory Visit | Attending: Family Medicine | Admitting: Family Medicine

## 2022-12-22 DIAGNOSIS — N6320 Unspecified lump in the left breast, unspecified quadrant: Secondary | ICD-10-CM

## 2022-12-22 DIAGNOSIS — R928 Other abnormal and inconclusive findings on diagnostic imaging of breast: Secondary | ICD-10-CM

## 2022-12-22 DIAGNOSIS — N6002 Solitary cyst of left breast: Secondary | ICD-10-CM | POA: Diagnosis not present

## 2022-12-27 ENCOUNTER — Ambulatory Visit
Admission: RE | Admit: 2022-12-27 | Discharge: 2022-12-27 | Disposition: A | Discharge status: Home / Self Care | Payer: Medicare Other | Source: Ambulatory Visit | Attending: Family Medicine | Admitting: Family Medicine

## 2022-12-27 ENCOUNTER — Other Ambulatory Visit: Payer: Self-pay | Admitting: Family Medicine

## 2022-12-27 DIAGNOSIS — E2839 Other primary ovarian failure: Secondary | ICD-10-CM

## 2022-12-27 DIAGNOSIS — N6325 Unspecified lump in the left breast, overlapping quadrants: Secondary | ICD-10-CM | POA: Diagnosis not present

## 2022-12-27 DIAGNOSIS — N6320 Unspecified lump in the left breast, unspecified quadrant: Secondary | ICD-10-CM

## 2022-12-27 DIAGNOSIS — R928 Other abnormal and inconclusive findings on diagnostic imaging of breast: Secondary | ICD-10-CM

## 2022-12-31 ENCOUNTER — Other Ambulatory Visit: Payer: Self-pay | Admitting: Family Medicine

## 2022-12-31 DIAGNOSIS — T7840XA Allergy, unspecified, initial encounter: Secondary | ICD-10-CM

## 2023-01-09 DIAGNOSIS — G4733 Obstructive sleep apnea (adult) (pediatric): Secondary | ICD-10-CM | POA: Diagnosis not present

## 2023-01-19 DIAGNOSIS — G4733 Obstructive sleep apnea (adult) (pediatric): Secondary | ICD-10-CM | POA: Diagnosis not present

## 2023-04-13 DIAGNOSIS — G4733 Obstructive sleep apnea (adult) (pediatric): Secondary | ICD-10-CM | POA: Diagnosis not present

## 2023-04-18 ENCOUNTER — Encounter (INDEPENDENT_AMBULATORY_CARE_PROVIDER_SITE_OTHER): Payer: Self-pay

## 2023-05-04 DIAGNOSIS — H33001 Unspecified retinal detachment with retinal break, right eye: Secondary | ICD-10-CM | POA: Diagnosis not present

## 2023-05-04 DIAGNOSIS — H52223 Regular astigmatism, bilateral: Secondary | ICD-10-CM | POA: Diagnosis not present

## 2023-05-04 DIAGNOSIS — H5211 Myopia, right eye: Secondary | ICD-10-CM | POA: Diagnosis not present

## 2023-05-04 DIAGNOSIS — H524 Presbyopia: Secondary | ICD-10-CM | POA: Diagnosis not present

## 2023-05-04 DIAGNOSIS — H53143 Visual discomfort, bilateral: Secondary | ICD-10-CM | POA: Diagnosis not present

## 2023-05-14 ENCOUNTER — Ambulatory Visit (INDEPENDENT_AMBULATORY_CARE_PROVIDER_SITE_OTHER): Payer: Medicare Other | Admitting: Family Medicine

## 2023-05-14 ENCOUNTER — Encounter: Payer: Self-pay | Admitting: Family Medicine

## 2023-05-14 VITALS — BP 128/80 | HR 58 | Temp 98.6°F | Resp 18 | Ht 65.0 in | Wt 178.0 lb

## 2023-05-14 DIAGNOSIS — K219 Gastro-esophageal reflux disease without esophagitis: Secondary | ICD-10-CM | POA: Diagnosis not present

## 2023-05-14 DIAGNOSIS — F411 Generalized anxiety disorder: Secondary | ICD-10-CM

## 2023-05-14 DIAGNOSIS — E785 Hyperlipidemia, unspecified: Secondary | ICD-10-CM

## 2023-05-14 DIAGNOSIS — T7840XA Allergy, unspecified, initial encounter: Secondary | ICD-10-CM | POA: Diagnosis not present

## 2023-05-14 DIAGNOSIS — Z23 Encounter for immunization: Secondary | ICD-10-CM | POA: Diagnosis not present

## 2023-05-14 DIAGNOSIS — E538 Deficiency of other specified B group vitamins: Secondary | ICD-10-CM | POA: Diagnosis not present

## 2023-05-14 DIAGNOSIS — I10 Essential (primary) hypertension: Secondary | ICD-10-CM | POA: Diagnosis not present

## 2023-05-14 DIAGNOSIS — E039 Hypothyroidism, unspecified: Secondary | ICD-10-CM

## 2023-05-14 DIAGNOSIS — E2839 Other primary ovarian failure: Secondary | ICD-10-CM

## 2023-05-14 DIAGNOSIS — J302 Other seasonal allergic rhinitis: Secondary | ICD-10-CM

## 2023-05-14 DIAGNOSIS — F341 Dysthymic disorder: Secondary | ICD-10-CM

## 2023-05-14 LAB — LIPID PANEL
Cholesterol: 187 mg/dL (ref 0–200)
HDL: 52.3 mg/dL (ref >39.00)
LDL Cholesterol: 118 mg/dL — ABNORMAL HIGH (ref 0–99)
NonHDL: 135.17
Total CHOL/HDL Ratio: 4
Triglycerides: 87 mg/dL (ref 0.0–149.0)
VLDL: 17.4 mg/dL (ref 0.0–40.0)

## 2023-05-14 LAB — COMPREHENSIVE METABOLIC PANEL
ALT: 10 U/L (ref 0–35)
AST: 15 U/L (ref 0–37)
Albumin: 4 g/dL (ref 3.5–5.2)
Alkaline Phosphatase: 45 U/L (ref 39–117)
BUN: 22 mg/dL (ref 6–23)
CO2: 30 mEq/L (ref 19–32)
Calcium: 9.2 mg/dL (ref 8.4–10.5)
Chloride: 101 mEq/L (ref 96–112)
Creatinine, Ser: 1.5 mg/dL — ABNORMAL HIGH (ref 0.40–1.20)
GFR: 33.88 mL/min — ABNORMAL LOW (ref >60.00)
Glucose, Bld: 85 mg/dL (ref 70–99)
Potassium: 3.4 mEq/L — ABNORMAL LOW (ref 3.5–5.1)
Sodium: 140 mEq/L (ref 135–145)
Total Bilirubin: 0.3 mg/dL (ref 0.2–1.2)
Total Protein: 7.1 g/dL (ref 6.0–8.3)

## 2023-05-14 LAB — CBC WITH DIFFERENTIAL/PLATELET
Basophils Absolute: 0 10*3/uL (ref 0.0–0.1)
Basophils Relative: 0.4 % (ref 0.0–3.0)
Eosinophils Absolute: 0.4 10*3/uL (ref 0.0–0.7)
Eosinophils Relative: 7.4 % — ABNORMAL HIGH (ref 0.0–5.0)
HCT: 34.6 % — ABNORMAL LOW (ref 36.0–46.0)
Hemoglobin: 11.1 g/dL — ABNORMAL LOW (ref 12.0–15.0)
Lymphocytes Relative: 22.3 % (ref 12.0–46.0)
Lymphs Abs: 1.3 10*3/uL (ref 0.7–4.0)
MCHC: 32.1 g/dL (ref 30.0–36.0)
MCV: 84.8 fl (ref 78.0–100.0)
Monocytes Absolute: 0.4 10*3/uL (ref 0.1–1.0)
Monocytes Relative: 7.4 % (ref 3.0–12.0)
Neutro Abs: 3.7 10*3/uL (ref 1.4–7.7)
Neutrophils Relative %: 62.5 % (ref 43.0–77.0)
Platelets: 308 10*3/uL (ref 150.0–400.0)
RBC: 4.08 Mil/uL (ref 3.87–5.11)
RDW: 13.6 % (ref 11.5–15.5)
WBC: 5.9 10*3/uL (ref 4.0–10.5)

## 2023-05-14 LAB — VITAMIN B12: Vitamin B-12: 577 pg/mL (ref 211–911)

## 2023-05-14 LAB — TSH: TSH: 1.59 u[IU]/mL (ref 0.35–5.50)

## 2023-05-14 MED ORDER — OLMESARTAN MEDOXOMIL 40 MG PO TABS: 40.0000 mg | ORAL_TABLET | Freq: Every day | ORAL | 3 refills | Status: DC

## 2023-05-14 MED ORDER — FLUTICASONE PROPIONATE 50 MCG/ACT NA SUSP: 2.0000 | NASAL | 3 refills | Status: DC | PRN

## 2023-05-14 MED ORDER — OMEPRAZOLE 40 MG PO CPDR
40.0000 mg | DELAYED_RELEASE_CAPSULE | Freq: Every day | ORAL | 3 refills | Status: DC
Start: 2023-05-14 — End: 2024-04-21

## 2023-05-14 MED ORDER — HYDROCHLOROTHIAZIDE 25 MG PO TABS: 25.0000 mg | ORAL_TABLET | Freq: Every day | ORAL | 3 refills | Status: DC | PRN

## 2023-05-14 MED ORDER — FENOFIBRATE 160 MG PO TABS
160.0000 mg | ORAL_TABLET | Freq: Every day | ORAL | 3 refills | Status: DC
Start: 2023-05-14 — End: 2023-11-29

## 2023-05-14 MED ORDER — CHLORTHALIDONE 25 MG PO TABS: 25.0000 mg | ORAL_TABLET | Freq: Every day | ORAL | 1 refills | Status: DC

## 2023-05-14 MED ORDER — LORATADINE 10 MG PO TABS
10.0000 mg | ORAL_TABLET | Freq: Every day | ORAL | 3 refills | Status: AC
Start: 2023-05-14 — End: ?

## 2023-05-14 MED ORDER — METOPROLOL TARTRATE 100 MG PO TABS
100.0000 mg | ORAL_TABLET | Freq: Two times a day (BID) | ORAL | 3 refills | Status: AC
Start: 2023-05-14 — End: ?

## 2023-05-14 MED ORDER — CITALOPRAM HYDROBROMIDE 40 MG PO TABS
40.0000 mg | ORAL_TABLET | Freq: Every morning | ORAL | 3 refills | Status: DC
Start: 2023-05-14 — End: 2023-11-29

## 2023-05-14 MED ORDER — ALPRAZOLAM 0.25 MG PO TABS
0.2500 mg | ORAL_TABLET | Freq: Two times a day (BID) | ORAL | 0 refills | Status: DC | PRN
Start: 2023-05-14 — End: 2024-04-15

## 2023-05-14 MED ORDER — MONTELUKAST SODIUM 10 MG PO TABS
10.0000 mg | ORAL_TABLET | Freq: Every day | ORAL | 2 refills | Status: DC
Start: 2023-05-14 — End: 2024-03-14

## 2023-05-14 MED ORDER — ROSUVASTATIN CALCIUM 40 MG PO TABS
40.0000 mg | ORAL_TABLET | Freq: Every day | ORAL | 3 refills | Status: DC
Start: 2023-05-14 — End: 2023-11-29

## 2023-05-14 MED ORDER — AMLODIPINE BESYLATE 5 MG PO TABS: ORAL_TABLET | ORAL | 3 refills | Status: DC

## 2023-05-14 NOTE — Assessment & Plan Note (Signed)
Stable  Con't celexa and alprazolam

## 2023-05-14 NOTE — Progress Notes (Signed)
Established Patient Office Visit  Subjective   Patient ID: Wendy Christensen, female    DOB: 06/22/1946  Age: 77 y.o. MRN: [id number]  Chief Complaint  Patient presents with   Annual Exam    Pt states fasting     HPI Pt here for cpe.    Discussed the use of AI scribe software for clinical note transcription with the patient, who gave verbal consent to proceed.  History of Present Illness   The patient presents for medication refills and cpe.  She reports feeling 'tired, weak, tired' and 'drained' with 'no energy.' She attributes this to anemia and is currently taking B12 supplements daily. She denies any swelling in her ankles, which only occurs when she is on her feet for a long time. She is also due for a flu shot and a bone density test, which she has not yet scheduled. She is currently taking ampicillin for a dental issue.      Patient Active Problem List   Diagnosis Date Noted   Localized swelling of both lower extremities 01/31/2022   Seasonal allergies 01/31/2022   History of asthma 04/13/2020   Preventative health care 04/13/2020   Urinary frequency 10/05/2019   Preop examination 07/22/2018   Tendon tear, ankle, right, initial encounter 07/16/2018   Candidiasis of vagina 04/23/2018   Uterine prolapse 04/23/2018   Mass of right axilla 04/18/2018   Chest pain 05/25/2017   History of colon polyps 05/25/2017   Hypertension 07/13/2015   Renal insufficiency 03/30/2015   Pain in the abdomen 02/24/2015   Intrinsic asthma 01/26/2015   Hyperglycemia 06/19/2013   Hypothyroidism 06/17/2013   OSA (obstructive sleep apnea) 01/14/2013   BACK PAIN, THORACIC REGION, LEFT 11/18/2010   ANXIETY DEPRESSION 05/20/2009   SKIN TAG 04/19/2009   POSTMENOPAUSAL STATUS 12/28/2008   SINUSITIS- ACUTE-NOS 09/09/2008   ANXIETY 09/01/2008   GENERALIZED ANXIETY DISORDER 06/30/2008   INSOMNIA 04/24/2008   GOITER NOS 06/04/2007   NUMBNESS, ARM 06/04/2007   WEIGHT LOSS 06/04/2007    Hyperlipidemia LDL goal <100 02/07/2007   Essential hypertension 02/07/2007   ALLERGIC RHINITIS 02/07/2007   GERD 02/07/2007   COLONOSCOPY, HX OF 02/07/2007   DILATION AND CURETTAGE, HX OF 02/07/2007   Past Medical History:  Diagnosis Date   Allergic rhinitis    Allergy    seasonal   Anxiety    Asthma, intrinsic    Cataract    bilateral repair   Depression    Frequency of urination    GERD (gastroesophageal reflux disease)    Hyperlipidemia    Hypertension    Kidney stone    Left kidney-calcium    OSA on CPAP    PT IN PROCESS OF GETTING MACHINE SET UP   Sleep apnea    cpap   Thyroid goiter    Urgency of urination    Wears glasses    Past Surgical History:  Procedure Laterality Date   BREAST BIOPSY     CATARACT EXTRACTION, BILATERAL     COLONOSCOPY  2013   CYSTOSCOPY WITH RETROGRADE PYELOGRAM, URETEROSCOPY AND STENT PLACEMENT Left 06/21/2015   Procedure: CYSTOSCOPY WITH RETROGRADE PYELOGRAM, URETEROSCOPY AND STENT PLACEMENT;  Surgeon: Hildred Laser, MD;  Location: [facility];  Service: Urology;  Laterality: Left;   DILATION AND CURETTAGE OF UTERUS  1990   W/ HYSTEROSCOPY   EYE SURGERY     retina both eyes 6/19   HOLMIUM LASER APPLICATION Left 06/21/2015   Procedure: HOLMIUM LASER APPLICATION;  Surgeon:  Hildred Laser, MD;  Location: [facility];  Service: Urology;  Laterality: Left;   STONE EXTRACTION WITH BASKET Left 06/21/2015   Procedure: STONE EXTRACTION WITH BASKET;  Surgeon: Hildred Laser, MD;  Location: [facility];  Service: Urology;  Laterality: Left;   TUBAL LIGATION  1980's   Social History   Tobacco Use   Smoking status: Never   Smokeless tobacco: Never  Vaping Use   Vaping status: Never Used  Substance Use Topics   Alcohol use: Not Currently    Alcohol/week: 0.0 standard drinks of alcohol   Drug use: No   Social History   Socioeconomic History   Marital status: Married    Spouse name:  Not on file   Number of children: Not on file   Years of education: Not on file   Highest education level: Not on file  Occupational History   Occupation: Retired    Associate Professor: DISABLE  Tobacco Use   Smoking status: Never   Smokeless tobacco: Never  Vaping Use   Vaping status: Never Used  Substance and Sexual Activity   Alcohol use: Not Currently    Alcohol/week: 0.0 standard drinks of alcohol   Drug use: No   Sexual activity: Not on file  Other Topics Concern   Not on file  Social History Narrative   Youth worker , daycare    Social Determinants of Health   Financial Resource Strain: Low Risk  (05/31/2020)   Overall Financial Resource Strain (CARDIA)    Difficulty of Paying Living Expenses: Not very hard  Food Insecurity: Not on file  Transportation Needs: No Transportation Needs (01/05/2021)   PRAPARE - Transportation    Lack of Transportation (Medical): No    Lack of Transportation (Non-Medical): No  Physical Activity: Insufficiently Active (01/05/2021)   Exercise Vital Sign    Days of Exercise per Week: 4 days    Minutes of Exercise per Session: 10 min  Stress: Not on file  Social Connections: Not on file  Intimate Partner Violence: Not on file   Family Status  Relation Name Status   Mother  Deceased at age 84       stroke and heart attack   Father  Deceased at age 74   Brother  (Not Specified)   Brother  (Not Specified)   Youth worker  (Not Specified)   Cousin  (Not Specified)   Cousin  (Not Specified)   Other  (Not Specified)   Other  (Not Specified)   Neg Hx  (Not Specified)  No partnership data on file   Family History  Problem Relation Age of Onset   Hypertension Mother    Heart disease Mother    Stroke Mother    Heart attack Mother    COPD Father    Asthma Father    Stroke Brother    Hypertension Brother    Hypertension Brother    Kidney cancer Maternal Aunt    Heart attack Maternal Aunt    Breast cancer Cousin    Breast cancer Cousin     Hyperlipidemia Other    Hypertension Other    Colon cancer Neg Hx    Colon polyps Neg Hx    Esophageal cancer Neg Hx    Rectal cancer Neg Hx    Stomach cancer Neg Hx    No Known Allergies    Review of Systems  Constitutional:  Negative for chills, fever and malaise/fatigue.  HENT:  Negative for congestion and hearing  loss.   Eyes:  Negative for blurred vision and discharge.  Respiratory:  Negative for cough, sputum production and shortness of breath.   Cardiovascular:  Negative for chest pain, palpitations and leg swelling.  Gastrointestinal:  Negative for abdominal pain, blood in stool, constipation, diarrhea, heartburn, nausea and vomiting.  Genitourinary:  Negative for dysuria, frequency, hematuria and urgency.  Musculoskeletal:  Negative for back pain, falls and myalgias.  Skin:  Negative for rash.  Neurological:  Negative for dizziness, sensory change, loss of consciousness, weakness and headaches.  Endo/Heme/Allergies:  Negative for environmental allergies. Does not bruise/bleed easily.  Psychiatric/Behavioral:  Negative for depression and suicidal ideas. The patient is not nervous/anxious and does not have insomnia.       Objective:     BP 128/80 (BP Location: Left Arm, Patient Position: Sitting, Cuff Size: Normal)   Pulse (!) 58   Temp 98.6 F (37 C) (Oral)   Resp 18   Ht 5\' 5"  (1.651 m)   Wt 178 lb (80.7 kg)   SpO2 98%   BMI 29.62 kg/m  BP Readings from Last 3 Encounters:  05/14/23 128/80  11/06/22 122/60  10/11/22 (!) 168/77   Wt Readings from Last 3 Encounters:  05/14/23 178 lb (80.7 kg)  11/06/22 181 lb 6.4 oz (82.3 kg)  07/13/22 186 lb 6.4 oz (84.6 kg)   SpO2 Readings from Last 3 Encounters:  05/14/23 98%  11/06/22 95%  10/11/22 96%      Physical Exam Vitals and nursing note reviewed.  Constitutional:      General: She is not in acute distress.    Appearance: Normal appearance. She is well-developed.  HENT:     Head: Normocephalic and  atraumatic.     Right Ear: Tympanic membrane, ear canal and external ear normal. There is no impacted cerumen.     Left Ear: Tympanic membrane, ear canal and external ear normal. There is no impacted cerumen.     Nose: Nose normal.     Mouth/Throat:     Mouth: Mucous membranes are moist.     Pharynx: Oropharynx is clear. No oropharyngeal exudate or posterior oropharyngeal erythema.  Eyes:     General: No scleral icterus.       Right eye: No discharge.        Left eye: No discharge.     Conjunctiva/sclera: Conjunctivae normal.     Pupils: Pupils are equal, round, and reactive to light.  Neck:     Thyroid: No thyromegaly or thyroid tenderness.     Vascular: No JVD.  Cardiovascular:     Rate and Rhythm: Normal rate and regular rhythm.     Heart sounds: Normal heart sounds. No murmur heard. Pulmonary:     Effort: Pulmonary effort is normal. No respiratory distress.     Breath sounds: Normal breath sounds.  Abdominal:     General: Bowel sounds are normal. There is no distension.     Palpations: Abdomen is soft. There is no mass.     Tenderness: There is no abdominal tenderness. There is no guarding or rebound.  Genitourinary:    Vagina: Normal.  Musculoskeletal:        General: Normal range of motion.     Cervical back: Normal range of motion and neck supple.     Right lower leg: No edema.     Left lower leg: No edema.  Lymphadenopathy:     Cervical: No cervical adenopathy.  Skin:    General: Skin is warm  and dry.     Findings: No erythema or rash.  Neurological:     Mental Status: She is alert and oriented to person, place, and time.     Cranial Nerves: No cranial nerve deficit.     Deep Tendon Reflexes: Reflexes are normal and symmetric.  Psychiatric:        Mood and Affect: Mood normal.        Behavior: Behavior normal.        Thought Content: Thought content normal.        Judgment: Judgment normal.      No results found for any visits on 05/14/23.  Last CBC Lab  Results  Component Value Date   WBC 6.1 11/06/2022   HGB 11.8 (L) 11/06/2022   HCT 36.1 11/06/2022   MCV 83.5 11/06/2022   MCH 27.3 05/11/2020   RDW 13.6 11/06/2022   PLT 325.0 11/06/2022   Last metabolic panel Lab Results  Component Value Date   GLUCOSE 85 11/06/2022   NA 137 11/06/2022   K 3.5 11/06/2022   CL 99 11/06/2022   CO2 31 11/06/2022   BUN 20 11/06/2022   CREATININE 1.39 (H) 11/06/2022   GFR 37.26 (L) 11/06/2022   CALCIUM 9.8 11/06/2022   PROT 7.2 11/06/2022   ALBUMIN 4.1 11/06/2022   BILITOT 0.4 11/06/2022   ALKPHOS 49 11/06/2022   AST 17 11/06/2022   ALT 12 11/06/2022   ANIONGAP 9 02/24/2015   Last lipids Lab Results  Component Value Date   CHOL 156 11/06/2022   HDL 57.80 11/06/2022   LDLCALC 88 11/06/2022   LDLDIRECT 215.7 10/18/2011   TRIG 53.0 11/06/2022   CHOLHDL 3 11/06/2022   Last hemoglobin A1c Lab Results  Component Value Date   HGBA1C 6.4 09/14/2014   Last thyroid functions Lab Results  Component Value Date   TSH 1.16 11/06/2022   T4TOTAL 7.7 11/16/2015   Last vitamin D Lab Results  Component Value Date   VD25OH 47 05/11/2020   Last vitamin B12 and Folate Lab Results  Component Value Date   VITAMINB12 1,944 (H) 05/11/2020      The 10-year ASCVD risk score (Arnett DK, et al., 2019) is: 12.6%    Assessment & Plan:   Problem List Items Addressed This Visit       Unprioritized   GENERALIZED ANXIETY DISORDER   Relevant Medications   ALPRAZolam (XANAX) 0.25 MG tablet   citalopram (CELEXA) 40 MG tablet   Seasonal allergies   Relevant Medications   fluticasone (FLONASE) 50 MCG/ACT nasal spray   loratadine (CLARITIN) 10 MG tablet   Hyperlipidemia LDL goal <100 - Primary   Relevant Medications   amLODipine (NORVASC) 5 MG tablet   chlorthalidone (HYGROTON) 25 MG tablet   fenofibrate 160 MG tablet   hydrochlorothiazide (HYDRODIURIL) 25 MG tablet   metoprolol tartrate (LOPRESSOR) 100 MG tablet   olmesartan (BENICAR) 40 MG  tablet   rosuvastatin (CRESTOR) 40 MG tablet   Other Relevant Orders   CBC with Differential/Platelet   Comprehensive metabolic panel   Lipid panel   TSH   GERD   Relevant Medications   omeprazole (PRILOSEC) 40 MG capsule   Essential hypertension   Relevant Medications   amLODipine (NORVASC) 5 MG tablet   chlorthalidone (HYGROTON) 25 MG tablet   fenofibrate 160 MG tablet   hydrochlorothiazide (HYDRODIURIL) 25 MG tablet   metoprolol tartrate (LOPRESSOR) 100 MG tablet   olmesartan (BENICAR) 40 MG tablet   rosuvastatin (CRESTOR) 40 MG tablet  Other Relevant Orders   CBC with Differential/Platelet   Comprehensive metabolic panel   Lipid panel   TSH   Other Visit Diagnoses     Allergy, initial encounter       Relevant Medications   montelukast (SINGULAIR) 10 MG tablet   Estrogen deficiency       Relevant Orders   DG Bone Density   B12 deficiency       Relevant Orders   Vitamin B12   Need for influenza vaccination       Relevant Orders   Flu Vaccine QUAD High Dose(Fluad)     Assessment and Plan    Fatigue Possible anemia or low B12. Patient is currently taking oral B12 supplements. -Order CBC and B12 levels. -Consider B12 injections if levels are low or borderline.  Medication Refills Multiple medications due for refill. -Refill all medications to be sent to [facility].  Preventive Health No record of shingles vaccine or recent bone density scan. Patient agrees to receive flu shot. -Order shingles vaccine and bone density scan. -Administer flu shot if available.  Edema Patient reports ankle swelling when standing for prolonged periods. -Advise patient to elevate feet when possible and monitor for worsening edema.        No follow-ups on file.    Donato Schultz, DO

## 2023-05-14 NOTE — Assessment & Plan Note (Signed)
Omeprazole daily

## 2023-05-14 NOTE — Assessment & Plan Note (Signed)
Encourage heart healthy diet such as MIND or DASH diet, increase exercise, avoid trans fats, simple carbohydrates and processed foods, consider a krill or fish or flaxseed oil cap daily.  °

## 2023-05-14 NOTE — Assessment & Plan Note (Signed)
Check labs  Con't synthroid 

## 2023-05-14 NOTE — Assessment & Plan Note (Signed)
Well controlled, no changes to meds. Encouraged heart healthy diet such as the DASH diet and exercise as tolerated.  °

## 2023-05-31 ENCOUNTER — Other Ambulatory Visit (HOSPITAL_BASED_OUTPATIENT_CLINIC_OR_DEPARTMENT_OTHER): Payer: Medicare Other

## 2023-06-07 ENCOUNTER — Other Ambulatory Visit (HOSPITAL_BASED_OUTPATIENT_CLINIC_OR_DEPARTMENT_OTHER): Payer: Medicare Other

## 2023-06-14 ENCOUNTER — Ambulatory Visit (HOSPITAL_BASED_OUTPATIENT_CLINIC_OR_DEPARTMENT_OTHER)
Admission: RE | Admit: 2023-06-14 | Discharge: 2023-06-14 | Disposition: A | Discharge status: Home / Self Care | Payer: Medicare Other | Source: Ambulatory Visit | Attending: Family Medicine | Admitting: Family Medicine

## 2023-06-14 DIAGNOSIS — M85851 Other specified disorders of bone density and structure, right thigh: Secondary | ICD-10-CM | POA: Diagnosis not present

## 2023-06-14 DIAGNOSIS — E2839 Other primary ovarian failure: Secondary | ICD-10-CM | POA: Insufficient documentation

## 2023-07-13 NOTE — Progress Notes (Unsigned)
HPI F married followed for OSA, complicated by HTN, CKD, Allergic Rhinitis, Asthma, GERD, Goiter/ Hypothyroid, Anxiety/ Depression, Hyperlipidemia, Insomnia, Covid infection Sept2023, HST 01/27/13- AHI 17.6/ hr, desaturation to 87%, body weight 191 lbs  CPAP to 10  ======================================================   07/13/22- 76 yoF married followed for OSA, complicated by HTN, CKD, Allergic Rhinitis, Asthma, GERD, Goiter/ Hypothyroid, Anxiety/ Depression, Hyperlipidemia, Insomnia, Covid infection Sept2023, HST 01/27/13- AHI 17.6/ hr, desaturation to 87%, body weight 191 lbs  CPAP to 10 Had CPAP from [facility], managed by Dr Shelle Iron, then Dr Vassie Loll. LOV around 2017. CPAP auto 5-15/ [facility]  replacement order 04/11/22 Download compliance- not available Body weight today- 186 lbs Covid vax-5 Phizer Flu vax-had ED 06/03/22 Covid infection> molnupiravir chosen due to low GFR 30/ Creat 1.56, She likes her replacement machine and is doing well.  We are asking [facility] to install Airview so we can download. Is noticing some mild wheeze now is fall weather comes in.  We will refill inhalers as discussed.  07/16/23- 70 yoF married followed for OSA, complicated by HTN, CKD, Allergic Rhinitis, Asthma, GERD, Goiter/ Hypothyroid, Anxiety/ Depression, Hyperlipidemia, Insomnia, Covid infection Sept2023, - albuterol hfa, Breo 200,  CPAP auto 5-15/ [facility]  replacement order 04/11/22 Download compliance-  Body weight today-  ----OSA, Congestion in ear on left ear Has had mild head congestion, esp L ear, x 1 week. Denies pain, sore throat, fever now. Chest ok. Sudafed not helping. Using CPAP replacement machine well- download reviewed. CXR 08/21/22- IMPRESSION: No active cardiopulmonary process. Stable mild biapical scarring.  ROS-see HPI + = positive Constitutional:    weight loss, night sweats, fevers, chills, fatigue, lassitude. HEENT:    headaches, difficulty swallowing, +tooth/dental problems, sore  throat,       sneezing,+ itching, ear ache, +nasal congestion, post nasal drip, snoring CV:    chest pain, orthopnea, PND, swelling in lower extremities, anasarca,                  dizziness, palpitations Resp:   +shortness of breath with exertion or at rest.                productive cough,   non-productive cough, coughing up of blood.              change in color of mucus.  wheezing.   Skin:    rash or lesions. GI:  +heartburn, indigestion, abdominal pain, nausea, vomiting, diarrhea,                 change in bowel habits, loss of appetite GU: dysuria, change in color of urine, no urgency or frequency.   flank pain. MS:   joint pain, stiffness, decreased range of motion, back pain. Neuro-     nothing unusual Psych:  change in mood or affect.  +depression or +anxiety.   memory loss.  OBJ- Physical Exam General- Alert, Oriented, Affect-appropriate, Distress- none acute Skin- rash-none, lesions- none, excoriation- none Lymphadenopathy- none Head- atraumatic            Eyes- Gross vision intact, PERRLA, conjunctivae and secretions clear            Ears- Cerumen R>L, L TM not red but retracted            Nose- Clear, no-Septal dev, mucus, polyps, erosion, perforation             Throat- Mallampati III-IV , mucosa clear , drainage- none, tonsils+, +teeth Neck- flexible , trachea midline, no stridor , thyroid  nl, carotid no bruit Chest - symmetrical excursion , unlabored           Heart/CV- RRR , +syst murmur , no gallop  , no rub, nl s1 s2                           - JVD- none , edema- none, stasis changes- none, varices- none           Lung- clear to P&A, wheeze- none, cough- none , dullness-none, rub- none           Chest wall-  Abd-  Br/ Gen/ Rectal- Not done, not indicated Extrem- cyanosis- none, clubbing, none, atrophy- none, strength- nl Neuro- grossly intact to observation

## 2023-07-16 ENCOUNTER — Encounter: Payer: Self-pay | Admitting: Internal Medicine

## 2023-07-16 ENCOUNTER — Ambulatory Visit: Payer: Medicare Other | Admitting: Internal Medicine

## 2023-07-16 VITALS — BP 128/66 | HR 64 | Temp 97.9°F | Ht 65.0 in | Wt 177.0 lb

## 2023-07-16 DIAGNOSIS — H6992 Unspecified Eustachian tube disorder, left ear: Secondary | ICD-10-CM

## 2023-07-16 DIAGNOSIS — G4733 Obstructive sleep apnea (adult) (pediatric): Secondary | ICD-10-CM

## 2023-07-16 DIAGNOSIS — H6990 Unspecified Eustachian tube disorder, unspecified ear: Secondary | ICD-10-CM | POA: Insufficient documentation

## 2023-07-16 MED ORDER — AZITHROMYCIN 250 MG PO TABS: ORAL_TABLET | ORAL | 0 refills | Status: DC

## 2023-07-16 NOTE — Assessment & Plan Note (Signed)
Add Zpak. Continue Sudafed and add Mucinex

## 2023-07-16 NOTE — Patient Instructions (Signed)
Order- DME P[facility]y [facility]ager- please install download capability- AirView/ card and provide download Continue auto 5-15  Script sent for Zpak antibiotic  Suggest Sudafed and Mucinex to help with congestion

## 2023-07-16 NOTE — Assessment & Plan Note (Signed)
Benefits from CPAP with good compliance and control Pln- add download capability, continue auto 5-15

## 2023-07-19 DIAGNOSIS — G4733 Obstructive sleep apnea (adult) (pediatric): Secondary | ICD-10-CM | POA: Diagnosis not present

## 2023-07-23 ENCOUNTER — Ambulatory Visit (INDEPENDENT_AMBULATORY_CARE_PROVIDER_SITE_OTHER): Payer: Medicare Other | Admitting: Family Medicine

## 2023-07-23 VITALS — BP 124/78 | HR 64 | Temp 98.3°F | Resp 18 | Ht 65.0 in | Wt 176.4 lb

## 2023-07-23 DIAGNOSIS — J0140 Acute pansinusitis, unspecified: Secondary | ICD-10-CM

## 2023-07-23 DIAGNOSIS — J302 Other seasonal allergic rhinitis: Secondary | ICD-10-CM

## 2023-07-23 MED ORDER — AMOXICILLIN-POT CLAVULANATE 875-125 MG PO TABS
1.0000 | ORAL_TABLET | Freq: Two times a day (BID) | ORAL | 0 refills | Status: DC
Start: 2023-07-23 — End: 2023-09-28

## 2023-07-23 MED ORDER — PREDNISONE 10 MG PO TABS
ORAL_TABLET | ORAL | 0 refills | Status: DC
Start: 2023-07-23 — End: 2023-09-28

## 2023-07-23 MED ORDER — FLUTICASONE PROPIONATE 50 MCG/ACT NA SUSP: 2.0000 | NASAL | 3 refills | Status: DC | PRN

## 2023-07-23 NOTE — Progress Notes (Unsigned)
-+  -    Established Patient Office Visit  Subjective   Patient ID: Wendy Christensen, female    DOB: 1946-06-11  Age: 77 y.o. MRN: [id number]  Chief Complaint  Patient presents with   Sinus Problem    X2 weeks, Pt states having drainage and using Sudafed and states unable to hear out of her left ear. No COVID test    HPI  {History (Optional):23778}  ROS    Objective:     BP 124/78 (BP Location: Left Arm, Patient Position: Sitting, Cuff Size: Large)   Pulse 64   Temp 98.3 F (36.8 C) (Oral)   Resp 18   Ht 5\' 5"  (1.651 m)   Wt 176 lb 6.4 oz (80 kg)   SpO2 98%   BMI 29.35 kg/m  {Vitals History (Optional):23777}  Physical Exam   No results found for any visits on 07/23/23.  {Labs (Optional):23779}  The 10-year ASCVD risk score (Arnett DK, et al., 2019) is: 13.6%    Assessment & Plan:   Problem List Items Addressed This Visit       Unprioritized   Seasonal allergies   Relevant Medications   fluticasone (FLONASE) 50 MCG/ACT nasal spray    No follow-ups on file.    Donato Schultz, DO

## 2023-07-24 ENCOUNTER — Encounter: Payer: Self-pay | Admitting: Family Medicine

## 2023-07-24 NOTE — Patient Instructions (Signed)

## 2023-09-20 ENCOUNTER — Ambulatory Visit: Payer: Medicare Other | Admitting: Family Medicine

## 2023-09-28 ENCOUNTER — Encounter: Payer: Self-pay | Admitting: Family Medicine

## 2023-09-28 ENCOUNTER — Ambulatory Visit (INDEPENDENT_AMBULATORY_CARE_PROVIDER_SITE_OTHER): Payer: Medicare Other | Admitting: Family Medicine

## 2023-09-28 VITALS — BP 118/80 | HR 80 | Temp 97.8°F | Resp 18 | Ht 65.0 in | Wt 171.2 lb

## 2023-09-28 DIAGNOSIS — I10 Essential (primary) hypertension: Secondary | ICD-10-CM

## 2023-09-28 DIAGNOSIS — E538 Deficiency of other specified B group vitamins: Secondary | ICD-10-CM | POA: Insufficient documentation

## 2023-09-28 DIAGNOSIS — H6123 Impacted cerumen, bilateral: Secondary | ICD-10-CM | POA: Insufficient documentation

## 2023-09-28 DIAGNOSIS — E785 Hyperlipidemia, unspecified: Secondary | ICD-10-CM

## 2023-09-28 DIAGNOSIS — E039 Hypothyroidism, unspecified: Secondary | ICD-10-CM

## 2023-09-28 LAB — LIPID PANEL
Cholesterol: 324 mg/dL — ABNORMAL HIGH (ref 0–200)
HDL: 54.5 mg/dL (ref >39.00)
LDL Cholesterol: 255 mg/dL — ABNORMAL HIGH (ref 0–99)
NonHDL: 269.99
Total CHOL/HDL Ratio: 6
Triglycerides: 74 mg/dL (ref 0.0–149.0)
VLDL: 14.8 mg/dL (ref 0.0–40.0)

## 2023-09-28 LAB — COMPREHENSIVE METABOLIC PANEL
ALT: 8 U/L (ref 0–35)
AST: 14 U/L (ref 0–37)
Albumin: 4.5 g/dL (ref 3.5–5.2)
Alkaline Phosphatase: 48 U/L (ref 39–117)
BUN: 31 mg/dL — ABNORMAL HIGH (ref 6–23)
CO2: 33 meq/L — ABNORMAL HIGH (ref 19–32)
Calcium: 9.6 mg/dL (ref 8.4–10.5)
Chloride: 96 meq/L (ref 96–112)
Creatinine, Ser: 1.78 mg/dL — ABNORMAL HIGH (ref 0.40–1.20)
GFR: 27.52 mL/min — ABNORMAL LOW (ref >60.00)
Glucose, Bld: 88 mg/dL (ref 70–99)
Potassium: 3.9 meq/L (ref 3.5–5.1)
Sodium: 137 meq/L (ref 135–145)
Total Bilirubin: 0.5 mg/dL (ref 0.2–1.2)
Total Protein: 7.7 g/dL (ref 6.0–8.3)

## 2023-09-28 LAB — CBC WITH DIFFERENTIAL/PLATELET
Basophils Absolute: 0 10*3/uL (ref 0.0–0.1)
Basophils Relative: 0.6 % (ref 0.0–3.0)
Eosinophils Absolute: 0.5 10*3/uL (ref 0.0–0.7)
Eosinophils Relative: 8.2 % — ABNORMAL HIGH (ref 0.0–5.0)
HCT: 38.6 % (ref 36.0–46.0)
Hemoglobin: 12.4 g/dL (ref 12.0–15.0)
Lymphocytes Relative: 18.7 % (ref 12.0–46.0)
Lymphs Abs: 1.1 10*3/uL (ref 0.7–4.0)
MCHC: 32 g/dL (ref 30.0–36.0)
MCV: 85.3 fL (ref 78.0–100.0)
Monocytes Absolute: 0.3 10*3/uL (ref 0.1–1.0)
Monocytes Relative: 5.5 % (ref 3.0–12.0)
Neutro Abs: 4.1 10*3/uL (ref 1.4–7.7)
Neutrophils Relative %: 67 % (ref 43.0–77.0)
Platelets: 374 10*3/uL (ref 150.0–400.0)
RBC: 4.52 Mil/uL (ref 3.87–5.11)
RDW: 13.6 % (ref 11.5–15.5)
WBC: 6.1 10*3/uL (ref 4.0–10.5)

## 2023-09-28 LAB — VITAMIN B12: Vitamin B-12: 566 pg/mL (ref 211–911)

## 2023-09-28 LAB — TSH: TSH: 1.92 u[IU]/mL (ref 0.35–5.50)

## 2023-09-28 NOTE — Assessment & Plan Note (Signed)
 Well controlled, no changes to meds. Encouraged heart healthy diet such as the DASH diet and exercise as tolerated.

## 2023-09-28 NOTE — Patient Instructions (Signed)
How to Perform the Epley Maneuver The Epley maneuver is an exercise that relieves symptoms of vertigo. Vertigo is the feeling that you or your surroundings are moving when they are not. When you feel vertigo, you may feel like the room is spinning and may have trouble walking. The Epley maneuver is used for a type of vertigo caused by a calcium deposit in a part of the inner ear. The maneuver involves changing head positions to help the deposit move out of the area. You can do this maneuver at home whenever you have symptoms of vertigo. You can repeat it in 24 hours if your vertigo has not gone away. Even though the Epley maneuver may relieve your vertigo for a few weeks, it is possible that your symptoms will return. This maneuver relieves vertigo, but it does not relieve dizziness. What are the risks? If it is done correctly, the Epley maneuver is considered safe. Sometimes it can lead to dizziness or nausea that goes away after a short time. If you develop other symptoms--such as changes in vision, weakness, or numbness--stop doing the maneuver and call your health care provider. Supplies needed: A bed or table. A pillow. How to do the Epley maneuver     Sit on the edge of a bed or table with your back straight and your legs extended or hanging over the edge of the bed or table. Turn your head halfway toward the affected ear or side as told by your health care provider. Lie backward quickly with your head turned until you are lying flat on your back. Your head should dangle (head-hanging position). You may want to position a pillow under your shoulders. Hold this position for at least 30 seconds. If you feel dizzy or have symptoms of vertigo, continue to hold the position until the symptoms stop. Turn your head to the opposite direction until your unaffected ear is facing down. Your head should continue to dangle. Hold this position for at least 30 seconds. If you feel dizzy or have symptoms of  vertigo, continue to hold the position until the symptoms stop. Turn your whole body to the same side as your head so that you are positioned on your side. Your head will now be nearly facedown and no longer needs to dangle. Hold for at least 30 seconds. If you feel dizzy or have symptoms of vertigo, continue to hold the position until the symptoms stop. Sit back up. You can repeat the maneuver in 24 hours if your vertigo does not go away. Follow these instructions at home: For 24 hours after doing the Epley maneuver: Keep your head in an upright position. When lying down to sleep or rest, keep your head raised (elevated) with two or more pillows. Avoid excessive neck movements. Activity Do not drive or use machinery if you feel dizzy. After doing the Epley maneuver, return to your normal activities as told by your health care provider. Ask your health care provider what activities are safe for you. General instructions Drink enough fluid to keep your urine pale yellow. Do not drink alcohol. Take over-the-counter and prescription medicines only as told by your health care provider. Keep all follow-up visits. This is important. Preventing vertigo symptoms Ask your health care provider if there is anything you should do at home to prevent vertigo. He or she may recommend that you: Keep your head elevated with two or more pillows while you sleep. Do not sleep on the side of your affected ear. Get   up slowly from bed. Avoid sudden movements during the day. Avoid extreme head positions or movement, such as looking up or bending over. Contact a health care provider if: Your vertigo gets worse. You have other symptoms, including: Nausea. Vomiting. Headache. Get help right away if you: Have vision changes. Have a headache or neck pain that is severe or getting worse. Cannot stop vomiting. Have new numbness or weakness in any part of your body. These symptoms may represent a serious problem  that is an emergency. Do not wait to see if the symptoms will go away. Get medical help right away. Call your local emergency services (911 in the [place] Do not drive yourself to the hospital. Summary Vertigo is the feeling that you or your surroundings are moving when they are not. The Epley maneuver is an exercise that relieves symptoms of vertigo. If the Epley maneuver is done correctly, it is considered safe. This information is not intended to replace advice given to you by your health care provider. Make sure you discuss any questions you have with your health care provider. Document Revised: 08/04/2020 Document Reviewed: 08/04/2020 Elsevier Patient Education  2024 Elsevier Inc.  

## 2023-09-28 NOTE — Assessment & Plan Note (Signed)
 Check tsh

## 2023-09-28 NOTE — Assessment & Plan Note (Signed)
 Check labs

## 2023-09-28 NOTE — Progress Notes (Signed)
 Established Patient Office Visit  Subjective   Patient ID: Wendy Christensen, female    DOB: 1945/10/24  Age: 78 y.o. MRN: [id number]  Chief Complaint  Patient presents with   Hypertension   Hyperlipidemia   Follow-up    HPI Discussed the use of AI scribe software for clinical note transcription with the patient, who gave verbal consent to proceed.  History of Present Illness   The patient presented with a chief complaint of dizziness that started the previous Friday. The dizziness was severe enough to cause a fall in the kitchen on Wednesday, although no injuries were reported from the fall. The patient also reported an issue with her left ear, which had been ongoing. The patient noted that she had been out in windy weather on the Friday when the dizziness started and felt the air in her ear, suggesting a possible connection between the ear issue and the dizziness.  The patient denied having a cold or significant sinus drainage, although she was taking Claritin  and using Flonase . The patient also reported using drops in her ears. The dizziness was described as severe enough to require the patient to hold onto something until it subsided, which usually took a few minutes. The patient noted that the dizziness could start even when she was just standing still.  The patient also reported having wax in both ears, with the left ear being more bothersome. The patient had been trying to manage the wax issue with drops. The patient's blood pressure, oxygen levels, and pulse were all reported to be good.      Patient Active Problem List   Diagnosis Date Noted   Bilateral impacted cerumen 09/28/2023   B12 deficiency 09/28/2023   Eustachian tube dysfunction 07/16/2023   Localized swelling of both lower extremities 01/31/2022   Seasonal allergies 01/31/2022   History of asthma 04/13/2020   Preventative health care 04/13/2020   Urinary frequency 10/05/2019   Preop examination 07/22/2018    Tendon tear, ankle, right, initial encounter 07/16/2018   Candidiasis of vagina 04/23/2018   Uterine prolapse 04/23/2018   Mass of right axilla 04/18/2018   Chest pain 05/25/2017   History of colon polyps 05/25/2017   Hypertension 07/13/2015   Renal insufficiency 03/30/2015   Pain in the abdomen 02/24/2015   Intrinsic asthma 01/26/2015   Hyperglycemia 06/19/2013   Hypothyroidism 06/17/2013   OSA (obstructive sleep apnea) 01/14/2013   BACK PAIN, THORACIC REGION, LEFT 11/18/2010   ANXIETY DEPRESSION 05/20/2009   Hypertrophic and atrophic condition of skin 04/19/2009   Asymptomatic postmenopausal status 12/28/2008   SINUSITIS- ACUTE-NOS 09/09/2008   Anxiety state 09/01/2008   GENERALIZED ANXIETY DISORDER 06/30/2008   INSOMNIA 04/24/2008   Goiter 06/04/2007   NUMBNESS, ARM 06/04/2007   WEIGHT LOSS 06/04/2007   Hyperlipidemia LDL goal <100 02/07/2007   Essential hypertension 02/07/2007   Allergic rhinitis 02/07/2007   GERD 02/07/2007   COLONOSCOPY, HX OF 02/07/2007   DILATION AND CURETTAGE, HX OF 02/07/2007   Past Medical History:  Diagnosis Date   Allergic rhinitis    Allergy     seasonal   Anxiety    Asthma, intrinsic    Cataract    bilateral repair   Depression    Frequency of urination    GERD (gastroesophageal reflux disease)    Hyperlipidemia    Hypertension    Kidney stone    Left kidney-calcium     OSA on CPAP    PT IN PROCESS OF GETTING MACHINE SET UP  Sleep apnea    cpap   Thyroid  goiter    Urgency of urination    Wears glasses    Past Surgical History:  Procedure Laterality Date   BREAST BIOPSY     CATARACT EXTRACTION, BILATERAL     COLONOSCOPY  2013   CYSTOSCOPY WITH RETROGRADE PYELOGRAM, URETEROSCOPY AND STENT PLACEMENT Left 06/21/2015   Procedure: CYSTOSCOPY WITH RETROGRADE PYELOGRAM, URETEROSCOPY AND STENT PLACEMENT;  Surgeon: Redell Lynwood Napoleon, MD;  Location: [facility];  Service: Urology;  Laterality: Left;   DILATION AND  CURETTAGE OF UTERUS  1990   W/ HYSTEROSCOPY   EYE SURGERY     retina both eyes 6/19   HOLMIUM LASER APPLICATION Left 06/21/2015   Procedure: HOLMIUM LASER APPLICATION;  Surgeon: Redell Lynwood Napoleon, MD;  Location: [facility];  Service: Urology;  Laterality: Left;   STONE EXTRACTION WITH BASKET Left 06/21/2015   Procedure: STONE EXTRACTION WITH BASKET;  Surgeon: Redell Lynwood Napoleon, MD;  Location: [facility];  Service: Urology;  Laterality: Left;   TUBAL LIGATION  1980's   Social History   Tobacco Use   Smoking status: Never   Smokeless tobacco: Never  Vaping Use   Vaping status: Never Used  Substance Use Topics   Alcohol use: Not Currently    Alcohol/week: 0.0 standard drinks of alcohol   Drug use: No   Social History   Socioeconomic History   Marital status: Married    Spouse name: Not on file   Number of children: Not on file   Years of education: Not on file   Highest education level: Associate degree: academic program  Occupational History   Occupation: Retired    Associate Professor: DISABLE  Tobacco Use   Smoking status: Never   Smokeless tobacco: Never  Vaping Use   Vaping status: Never Used  Substance and Sexual Activity   Alcohol use: Not Currently    Alcohol/week: 0.0 standard drinks of alcohol   Drug use: No   Sexual activity: Not on file  Other Topics Concern   Not on file  Social History Narrative   Youth Worker , daycare    Social Drivers of Health   Financial Resource Strain: Medium Risk (09/27/2023)   Overall Financial Resource Strain (CARDIA)    Difficulty of Paying Living Expenses: Somewhat hard  Food Insecurity: Patient Declined (09/27/2023)   Hunger Vital Sign    Worried About Running Out of Food in the Last Year: Patient declined    Ran Out of Food in the Last Year: Patient declined  Transportation Needs: No Transportation Needs (09/27/2023)   PRAPARE - Administrator, Civil Service (Medical): No    Lack of  Transportation (Non-Medical): No  Physical Activity: Insufficiently Active (09/27/2023)   Exercise Vital Sign    Days of Exercise per Week: 3 days    Minutes of Exercise per Session: 30 min  Stress: Stress Concern Present (09/27/2023)   [employer] of Occupational Health - Occupational Stress Questionnaire    Feeling of Stress : To some extent  Social Connections: Moderately Isolated (09/27/2023)   Social Connection and Isolation Panel [NHANES]    Frequency of Communication with Friends and Family: More than three times a week    Frequency of Social Gatherings with Friends and Family: Once a week    Attends Religious Services: Never    Database Administrator or Organizations: No    Attends Banker Meetings: Not on file  Marital Status: Married  Catering Manager Violence: Not on file   Family Status  Relation Name Status   Mother  Deceased at age 56       stroke and heart attack   Father  Deceased at age 34   Brother  (Not Specified)   Brother  (Not Specified)   Youth Worker  (Not Specified)   Cousin  (Not Specified)   Cousin  (Not Specified)   Other  (Not Specified)   Other  (Not Specified)   Neg Hx  (Not Specified)  No partnership data on file   Family History  Problem Relation Age of Onset   Hypertension Mother    Heart disease Mother    Stroke Mother    Heart attack Mother    COPD Father    Asthma Father    Stroke Brother    Hypertension Brother    Hypertension Brother    Kidney cancer Maternal Aunt    Heart attack Maternal Aunt    Breast cancer Cousin    Breast cancer Cousin    Hyperlipidemia Other    Hypertension Other    Colon cancer Neg Hx    Colon polyps Neg Hx    Esophageal cancer Neg Hx    Rectal cancer Neg Hx    Stomach cancer Neg Hx    No Known Allergies    Review of Systems  Constitutional:  Negative for fever.  HENT:  Negative for congestion.   Eyes:  Negative for blurred vision.  Respiratory:  Negative for cough.    Cardiovascular:  Negative for chest pain and palpitations.  Gastrointestinal:  Negative for vomiting.  Musculoskeletal:  Negative for back pain.  Skin:  Negative for rash.  Neurological:  Negative for loss of consciousness and headaches.      Objective:     BP 118/80 (BP Location: Left Arm, Patient Position: Sitting, Cuff Size: Normal)   Pulse 80   Temp 97.8 F (36.6 C) (Oral)   Resp 18   Ht 5' 5 (1.651 m)   Wt 171 lb 3.2 oz (77.7 kg)   SpO2 97%   BMI 28.49 kg/m  BP Readings from Last 3 Encounters:  09/28/23 118/80  07/23/23 124/78  07/16/23 128/66   Wt Readings from Last 3 Encounters:  09/28/23 171 lb 3.2 oz (77.7 kg)  07/23/23 176 lb 6.4 oz (80 kg)  07/16/23 177 lb (80.3 kg)   SpO2 Readings from Last 3 Encounters:  09/28/23 97%  07/23/23 98%  05/14/23 98%      Physical Exam Vitals and nursing note reviewed.  Constitutional:      General: She is not in acute distress.    Appearance: Normal appearance. She is well-developed.  HENT:     Head: Normocephalic and atraumatic.     Right Ear: There is impacted cerumen.     Left Ear: There is impacted cerumen.     Ears:     Comments: With pt permission--- ears irrigated  successfully  Exam after -- TMI,  no signs of indection Eyes:     General: No scleral icterus.       Right eye: No discharge.        Left eye: No discharge.  Cardiovascular:     Rate and Rhythm: Normal rate and regular rhythm.     Heart sounds: No murmur heard. Pulmonary:     Effort: Pulmonary effort is normal. No respiratory distress.     Breath sounds: Normal breath sounds.  Musculoskeletal:  General: Normal range of motion.     Cervical back: Normal range of motion and neck supple.     Right lower leg: No edema.     Left lower leg: No edema.  Skin:    General: Skin is warm and dry.  Neurological:     Mental Status: She is alert and oriented to person, place, and time.  Psychiatric:        Mood and Affect: Mood normal.         Behavior: Behavior normal.        Thought Content: Thought content normal.        Judgment: Judgment normal.      No results found for any visits on 09/28/23.  Last CBC Lab Results  Component Value Date   WBC 5.9 05/14/2023   HGB 11.1 (L) 05/14/2023   HCT 34.6 (L) 05/14/2023   MCV 84.8 05/14/2023   MCH 27.3 05/11/2020   RDW 13.6 05/14/2023   PLT 308.0 05/14/2023   Last metabolic panel Lab Results  Component Value Date   GLUCOSE 85 05/14/2023   NA 140 05/14/2023   K 3.4 (L) 05/14/2023   CL 101 05/14/2023   CO2 30 05/14/2023   BUN 22 05/14/2023   CREATININE 1.50 (H) 05/14/2023   GFR 33.88 (L) 05/14/2023   CALCIUM  9.2 05/14/2023   PROT 7.1 05/14/2023   ALBUMIN 4.0 05/14/2023   BILITOT 0.3 05/14/2023   ALKPHOS 45 05/14/2023   AST 15 05/14/2023   ALT 10 05/14/2023   ANIONGAP 9 02/24/2015   Last lipids Lab Results  Component Value Date   CHOL 187 05/14/2023   HDL 52.30 05/14/2023   LDLCALC 118 (H) 05/14/2023   LDLDIRECT 215.7 10/18/2011   TRIG 87.0 05/14/2023   CHOLHDL 4 05/14/2023   Last hemoglobin A1c Lab Results  Component Value Date   HGBA1C 6.4 09/14/2014   Last thyroid  functions Lab Results  Component Value Date   TSH 1.59 05/14/2023   T4TOTAL 7.7 11/16/2015   Last vitamin D  Lab Results  Component Value Date   VD25OH 47 05/11/2020   Last vitamin B12 and Folate Lab Results  Component Value Date   VITAMINB12 577 05/14/2023      The 10-year ASCVD risk score (Arnett DK, et al., 2019) is: 12.6%    Assessment & Plan:   Problem List Items Addressed This Visit       Unprioritized   Hyperlipidemia LDL goal <100 - Primary   Relevant Orders   Comprehensive metabolic panel   Lipid panel   Hypothyroidism   Check tsh      Relevant Orders   TSH   Essential hypertension   Well controlled, no changes to meds. Encouraged heart healthy diet such as the DASH diet and exercise as tolerated.        Relevant Orders   CBC with  Differential/Platelet   Comprehensive metabolic panel   Lipid panel   TSH   Bilateral impacted cerumen   Irrigated with no coplications--- unable to remove with hoop Exam after normal       B12 deficiency   Relevant Orders   Vitamin B12  Assessment and Plan    Dizziness   She has experienced intermittent dizziness since last Friday evening, including a fall on Wednesday without injuries. Episodes persist for a few minutes, even when stationary, without fever. Cerumen impaction and exposure to windy conditions may contribute to her condition, while she is currently on Claritin  and Flonase . We discussed how  cerumen impaction might cause dizziness by exerting pressure on the eardrum. We will remove cerumen, beginning with the left ear.  Cerumen Impaction   Cerumen is present in both ears, more so in the left, causing symptoms. She has been using ear drops. We discussed that cerumen removal could alleviate symptoms of dizziness. We will remove cerumen from both ears, starting with the left.        Return if symptoms worsen or fail to improve.    Karmela Bram R Lowne Chase, DO

## 2023-09-28 NOTE — Assessment & Plan Note (Signed)
 Irrigated with no coplications--- unable to remove with hoop Exam after normal

## 2023-10-07 ENCOUNTER — Encounter: Payer: Self-pay | Admitting: Family Medicine

## 2023-10-07 ENCOUNTER — Other Ambulatory Visit: Payer: Self-pay | Admitting: Family Medicine

## 2023-10-07 DIAGNOSIS — E785 Hyperlipidemia, unspecified: Secondary | ICD-10-CM

## 2023-11-20 ENCOUNTER — Other Ambulatory Visit: Payer: Self-pay | Admitting: Family Medicine

## 2023-11-20 DIAGNOSIS — J302 Other seasonal allergic rhinitis: Secondary | ICD-10-CM

## 2023-11-21 ENCOUNTER — Other Ambulatory Visit: Payer: Self-pay | Admitting: Family Medicine

## 2023-11-21 DIAGNOSIS — Z1231 Encounter for screening mammogram for malignant neoplasm of breast: Secondary | ICD-10-CM

## 2023-11-29 ENCOUNTER — Ambulatory Visit (INDEPENDENT_AMBULATORY_CARE_PROVIDER_SITE_OTHER): Admitting: Family Medicine

## 2023-11-29 ENCOUNTER — Encounter: Payer: Self-pay | Admitting: Family Medicine

## 2023-11-29 VITALS — BP 114/70 | HR 53 | Temp 97.8°F | Resp 18 | Ht 65.0 in | Wt 169.4 lb

## 2023-11-29 DIAGNOSIS — F411 Generalized anxiety disorder: Secondary | ICD-10-CM | POA: Diagnosis not present

## 2023-11-29 DIAGNOSIS — E039 Hypothyroidism, unspecified: Secondary | ICD-10-CM

## 2023-11-29 DIAGNOSIS — R5383 Other fatigue: Secondary | ICD-10-CM | POA: Diagnosis not present

## 2023-11-29 DIAGNOSIS — R0609 Other forms of dyspnea: Secondary | ICD-10-CM | POA: Diagnosis not present

## 2023-11-29 DIAGNOSIS — E785 Hyperlipidemia, unspecified: Secondary | ICD-10-CM | POA: Diagnosis not present

## 2023-11-29 DIAGNOSIS — E538 Deficiency of other specified B group vitamins: Secondary | ICD-10-CM | POA: Diagnosis not present

## 2023-11-29 DIAGNOSIS — I10 Essential (primary) hypertension: Secondary | ICD-10-CM | POA: Diagnosis not present

## 2023-11-29 DIAGNOSIS — R634 Abnormal weight loss: Secondary | ICD-10-CM

## 2023-11-29 DIAGNOSIS — F418 Other specified anxiety disorders: Secondary | ICD-10-CM

## 2023-11-29 LAB — VITAMIN D 25 HYDROXY (VIT D DEFICIENCY, FRACTURES): VITD: 22.7 ng/mL — ABNORMAL LOW (ref 30.00–100.00)

## 2023-11-29 LAB — CBC WITH DIFFERENTIAL/PLATELET
Basophils Absolute: 0 10*3/uL (ref 0.0–0.1)
Basophils Relative: 0.6 % (ref 0.0–3.0)
Eosinophils Absolute: 0.6 10*3/uL (ref 0.0–0.7)
Eosinophils Relative: 8.7 % — ABNORMAL HIGH (ref 0.0–5.0)
HCT: 36.1 % (ref 36.0–46.0)
Hemoglobin: 11.5 g/dL — ABNORMAL LOW (ref 12.0–15.0)
Lymphocytes Relative: 21.7 % (ref 12.0–46.0)
Lymphs Abs: 1.4 10*3/uL (ref 0.7–4.0)
MCHC: 31.8 g/dL (ref 30.0–36.0)
MCV: 84.5 fl (ref 78.0–100.0)
Monocytes Absolute: 0.4 10*3/uL (ref 0.1–1.0)
Monocytes Relative: 6.9 % (ref 3.0–12.0)
Neutro Abs: 4 10*3/uL (ref 1.4–7.7)
Neutrophils Relative %: 62.1 % (ref 43.0–77.0)
Platelets: 309 10*3/uL (ref 150.0–400.0)
RBC: 4.27 Mil/uL (ref 3.87–5.11)
RDW: 14 % (ref 11.5–15.5)
WBC: 6.4 10*3/uL (ref 4.0–10.5)

## 2023-11-29 LAB — VITAMIN B12: Vitamin B-12: 561 pg/mL (ref 211–911)

## 2023-11-29 LAB — COMPREHENSIVE METABOLIC PANEL
ALT: 10 U/L (ref 0–35)
AST: 17 U/L (ref 0–37)
Albumin: 4.5 g/dL (ref 3.5–5.2)
Alkaline Phosphatase: 39 U/L (ref 39–117)
BUN: 30 mg/dL — ABNORMAL HIGH (ref 6–23)
CO2: 32 meq/L (ref 19–32)
Calcium: 9.9 mg/dL (ref 8.4–10.5)
Chloride: 100 meq/L (ref 96–112)
Creatinine, Ser: 1.65 mg/dL — ABNORMAL HIGH (ref 0.40–1.20)
GFR: 30.1 mL/min — ABNORMAL LOW (ref >60.00)
Glucose, Bld: 90 mg/dL (ref 70–99)
Potassium: 4.1 meq/L (ref 3.5–5.1)
Sodium: 139 meq/L (ref 135–145)
Total Bilirubin: 0.5 mg/dL (ref 0.2–1.2)
Total Protein: 7.5 g/dL (ref 6.0–8.3)

## 2023-11-29 LAB — TSH: TSH: 1.12 u[IU]/mL (ref 0.35–5.50)

## 2023-11-29 LAB — LIPID PANEL
Cholesterol: 224 mg/dL — ABNORMAL HIGH (ref 0–200)
HDL: 70.8 mg/dL (ref >39.00)
LDL Cholesterol: 142 mg/dL — ABNORMAL HIGH (ref 0–99)
NonHDL: 153.26
Total CHOL/HDL Ratio: 3
Triglycerides: 57 mg/dL (ref 0.0–149.0)
VLDL: 11.4 mg/dL (ref 0.0–40.0)

## 2023-11-29 MED ORDER — ROSUVASTATIN CALCIUM 40 MG PO TABS: 40.0000 mg | ORAL_TABLET | Freq: Every day | ORAL | 3 refills | Status: AC

## 2023-11-29 MED ORDER — FENOFIBRATE 160 MG PO TABS
160.0000 mg | ORAL_TABLET | Freq: Every day | ORAL | 3 refills | Status: AC
Start: 2023-11-29 — End: ?

## 2023-11-29 MED ORDER — DESVENLAFAXINE SUCCINATE ER 50 MG PO TB24: 50.0000 mg | ORAL_TABLET | Freq: Every day | ORAL | 3 refills | Status: DC

## 2023-11-29 MED ORDER — CITALOPRAM HYDROBROMIDE 40 MG PO TABS: 40.0000 mg | ORAL_TABLET | Freq: Every morning | ORAL | 3 refills | Status: DC

## 2023-11-29 MED ORDER — OLMESARTAN MEDOXOMIL 40 MG PO TABS: 40.0000 mg | ORAL_TABLET | Freq: Every day | ORAL | 3 refills | Status: AC

## 2023-11-29 MED ORDER — AMLODIPINE BESYLATE 5 MG PO TABS
ORAL_TABLET | ORAL | 3 refills | Status: AC
Start: 2023-11-29 — End: ?

## 2023-11-29 MED ORDER — CHLORTHALIDONE 25 MG PO TABS
25.0000 mg | ORAL_TABLET | Freq: Every day | ORAL | 3 refills | Status: AC
Start: 2023-11-29 — End: ?

## 2023-11-29 NOTE — Assessment & Plan Note (Signed)
 Only 7 lbs since last office visit Check labs  ? Depression

## 2023-11-29 NOTE — Patient Instructions (Signed)
 Fatigue If you have fatigue, you feel tired all the time and have a lack of energy or a lack of motivation. Fatigue may make it difficult to start or complete tasks because of exhaustion. Occasional or mild fatigue is often a normal response to activity or life. However, long-term (chronic) or extreme fatigue may be a symptom of a medical condition such as: Depression. Not having enough red blood cells or hemoglobin in the blood (anemia). A problem with a small gland located in the lower front part of the neck (thyroid disorder). Rheumatologic conditions. These are problems related to the body's defense system (immune system). Infections, especially certain viral infections. Fatigue can also lead to negative health outcomes over time. Follow these instructions at home: Medicines Take over-the-counter and prescription medicines only as told by your health care provider. Take a multivitamin if told by your health care provider. Do not use herbal or dietary supplements unless they are approved by your health care provider. Eating and drinking  Avoid heavy meals in the evening. Eat a well-balanced diet, which includes lean proteins, whole grains, plenty of fruits and vegetables, and low-fat dairy products. Avoid eating or drinking too many products with caffeine in them. Avoid alcohol. Drink enough fluid to keep your urine pale yellow. Activity  Exercise regularly, as told by your health care provider. Use or practice techniques to help you relax, such as yoga, tai chi, meditation, or massage therapy. Lifestyle Change situations that cause you stress. Try to keep your work and personal schedules in balance. Do not use recreational or illegal drugs. General instructions Monitor your fatigue for any changes. Go to bed and get up at the same time every day. Avoid fatigue by pacing yourself during the day and getting enough sleep at night. Maintain a healthy weight. Contact a health care  provider if: Your fatigue does not get better. You have a fever. You suddenly lose or gain weight. You have headaches. You have trouble falling asleep or sleeping through the night. You feel angry, guilty, anxious, or sad. You have swelling in your legs or another part of your body. Get help right away if: You feel confused, feel like you might faint, or faint. Your vision is blurry or you have a severe headache. You have severe pain in your abdomen, your back, or the area between your waist and hips (pelvis). You have chest pain, shortness of breath, or an irregular or fast heartbeat. You are unable to urinate, or you urinate less than normal. You have abnormal bleeding from the rectum, nose, lungs, nipples, or, if you are female, the vagina. You vomit blood. You have thoughts about hurting yourself or others. These symptoms may be an emergency. Get help right away. Call 911. Do not wait to see if the symptoms will go away. Do not drive yourself to the hospital. Get help right away if you feel like you may hurt yourself or others, or have thoughts about taking your own life. Go to your nearest emergency room or: Call 911. Call the National Suicide Prevention Lifeline at (262)721-8699 or 988. This is open 24 hours a day. Text the Crisis Text Line at 8450584327. Summary If you have fatigue, you feel tired all the time and have a lack of energy or a lack of motivation. Fatigue may make it difficult to start or complete tasks because of exhaustion. Long-term (chronic) or extreme fatigue may be a symptom of a medical condition. Exercise regularly, as told by your health care provider.  Change situations that cause you stress. Try to keep your work and personal schedules in balance. This information is not intended to replace advice given to you by your health care provider. Make sure you discuss any questions you have with your health care provider. Document Revised: 06/27/2021 Document  Reviewed: 06/27/2021 Elsevier Patient Education  2024 [employer].

## 2023-11-29 NOTE — Progress Notes (Signed)
 Established Patient Office Visit  Subjective   Patient ID: Wendy Christensen, female    DOB: Nov 08, 1945  Age: 78 y.o. MRN: [id number]  Chief Complaint  Patient presents with   Weight Loss    Pt feels she has lost a lot of weight suddenly and states having low energy.    HPI Discussed the use of AI scribe software for clinical note transcription with the patient, who gave verbal consent to proceed.  History of Present Illness   The patient presents with fatigue and shortness of breath.  She experiences significant fatigue and shortness of breath, particularly when walking. She feels 'really out of breath' with physical activity. No chest pain or palpitations, and shortness of breath occurs primarily with walking. No swelling.  She notes a possible depressive mood, attributing it to stress related to her husband's illness and his frequent complaints. This stress may be contributing to her overall fatigue.  She reports a weight loss of 7 pounds since November, from 176 to 169 pounds. She describes her eating as 'fair' and believes she is getting enough protein.  She continues to work two days a week, although she describes her activity level as limited. She does not monitor her blood pressure at home. She is not currently taking hydrochlorothiazide, but is on amlodipine, chlorthalidone, olmesartan, and metoprolol for blood pressure management. She has been on metoprolol for a year. She takes B12 supplements daily.      Patient Active Problem List   Diagnosis Date Noted   Bilateral impacted cerumen 09/28/2023   B12 deficiency 09/28/2023   Eustachian tube dysfunction 07/16/2023   Localized swelling of both lower extremities 01/31/2022   Seasonal allergies 01/31/2022   History of asthma 04/13/2020   Preventative health care 04/13/2020   Urinary frequency 10/05/2019   Preop examination 07/22/2018   Tendon tear, ankle, right, initial encounter 07/16/2018   Candidiasis of vagina  04/23/2018   Uterine prolapse 04/23/2018   Mass of right axilla 04/18/2018   Chest pain 05/25/2017   History of colon polyps 05/25/2017   Hypertension 07/13/2015   Renal insufficiency 03/30/2015   Pain in the abdomen 02/24/2015   Intrinsic asthma 01/26/2015   Hyperglycemia 06/19/2013   Hypothyroidism 06/17/2013   OSA (obstructive sleep apnea) 01/14/2013   BACK PAIN, THORACIC REGION, LEFT 11/18/2010   ANXIETY DEPRESSION 05/20/2009   Hypertrophic and atrophic condition of skin 04/19/2009   Asymptomatic postmenopausal status 12/28/2008   SINUSITIS- ACUTE-NOS 09/09/2008   Anxiety state 09/01/2008   GENERALIZED ANXIETY DISORDER 06/30/2008   INSOMNIA 04/24/2008   Goiter 06/04/2007   NUMBNESS, ARM 06/04/2007   WEIGHT LOSS 06/04/2007   Hyperlipidemia LDL goal <100 02/07/2007   Essential hypertension 02/07/2007   Allergic rhinitis 02/07/2007   GERD 02/07/2007   COLONOSCOPY, HX OF 02/07/2007   DILATION AND CURETTAGE, HX OF 02/07/2007   Past Medical History:  Diagnosis Date   Allergic rhinitis    Allergy    seasonal   Anxiety    Asthma, intrinsic    Cataract    bilateral repair   Depression    Frequency of urination    GERD (gastroesophageal reflux disease)    Hyperlipidemia    Hypertension    Kidney stone    Left kidney-calcium    OSA on CPAP    PT IN PROCESS OF GETTING MACHINE SET UP   Sleep apnea    cpap   Thyroid goiter    Urgency of urination    Wears glasses  Past Surgical History:  Procedure Laterality Date   BREAST BIOPSY     CATARACT EXTRACTION, BILATERAL     COLONOSCOPY  2013   CYSTOSCOPY WITH RETROGRADE PYELOGRAM, URETEROSCOPY AND STENT PLACEMENT Left 06/21/2015   Procedure: CYSTOSCOPY WITH RETROGRADE PYELOGRAM, URETEROSCOPY AND STENT PLACEMENT;  Surgeon: Hildred Laser, MD;  Location: [facility];  Service: Urology;  Laterality: Left;   DILATION AND CURETTAGE OF UTERUS  1990   W/ HYSTEROSCOPY   EYE SURGERY     retina both eyes 6/19    HOLMIUM LASER APPLICATION Left 06/21/2015   Procedure: HOLMIUM LASER APPLICATION;  Surgeon: Hildred Laser, MD;  Location: [facility];  Service: Urology;  Laterality: Left;   STONE EXTRACTION WITH BASKET Left 06/21/2015   Procedure: STONE EXTRACTION WITH BASKET;  Surgeon: Hildred Laser, MD;  Location: [facility];  Service: Urology;  Laterality: Left;   TUBAL LIGATION  1980's   Social History   Tobacco Use   Smoking status: Never   Smokeless tobacco: Never  Vaping Use   Vaping status: Never Used  Substance Use Topics   Alcohol use: Not Currently    Alcohol/week: 0.0 standard drinks of alcohol   Drug use: No   Social History   Socioeconomic History   Marital status: Married    Spouse name: Not on file   Number of children: Not on file   Years of education: Not on file   Highest education level: Associate degree: academic program  Occupational History   Occupation: Retired    Associate Professor: DISABLE  Tobacco Use   Smoking status: Never   Smokeless tobacco: Never  Vaping Use   Vaping status: Never Used  Substance and Sexual Activity   Alcohol use: Not Currently    Alcohol/week: 0.0 standard drinks of alcohol   Drug use: No   Sexual activity: Not on file  Other Topics Concern   Not on file  Social History Narrative   Youth worker , daycare    Social Drivers of Health   Financial Resource Strain: Medium Risk (09/27/2023)   Overall Financial Resource Strain (CARDIA)    Difficulty of Paying Living Expenses: Somewhat hard  Food Insecurity: Patient Declined (09/27/2023)   Hunger Vital Sign    Worried About Running Out of Food in the Last Year: Patient declined    Ran Out of Food in the Last Year: Patient declined  Transportation Needs: No Transportation Needs (09/27/2023)   PRAPARE - Administrator, Civil Service (Medical): No    Lack of Transportation (Non-Medical): No  Physical Activity: Insufficiently Active (09/27/2023)    Exercise Vital Sign    Days of Exercise per Week: 3 days    Minutes of Exercise per Session: 30 min  Stress: Stress Concern Present (09/27/2023)   [employer] of Occupational Health - Occupational Stress Questionnaire    Feeling of Stress : To some extent  Social Connections: Moderately Isolated (09/27/2023)   Social Connection and Isolation Panel [NHANES]    Frequency of Communication with Friends and Family: More than three times a week    Frequency of Social Gatherings with Friends and Family: Once a week    Attends Religious Services: Never    Database administrator or Organizations: No    Attends Engineer, structural: Not on file    Marital Status: Married  Catering manager Violence: Not on file   Family Status  Relation Name Status   Mother  Deceased at  age 76       stroke and heart attack   Father  Deceased at age 38   Brother  (Not Specified)   Brother  (Not Specified)   Youth worker  (Not Specified)   Cousin  (Not Specified)   Cousin  (Not Specified)   Other  (Not Specified)   Other  (Not Specified)   Neg Hx  (Not Specified)  No partnership data on file   Family History  Problem Relation Age of Onset   Hypertension Mother    Heart disease Mother    Stroke Mother    Heart attack Mother    COPD Father    Asthma Father    Stroke Brother    Hypertension Brother    Hypertension Brother    Kidney cancer Maternal Aunt    Heart attack Maternal Aunt    Breast cancer Cousin    Breast cancer Cousin    Hyperlipidemia Other    Hypertension Other    Colon cancer Neg Hx    Colon polyps Neg Hx    Esophageal cancer Neg Hx    Rectal cancer Neg Hx    Stomach cancer Neg Hx    No Known Allergies    Review of Systems  Constitutional:  Positive for malaise/fatigue and weight loss. Negative for fever.  HENT:  Negative for congestion.   Eyes:  Negative for blurred vision.  Respiratory:  Positive for shortness of breath. Negative for cough.   Cardiovascular:   Negative for chest pain, palpitations and leg swelling.  Gastrointestinal:  Negative for vomiting.  Musculoskeletal:  Negative for back pain.  Skin:  Negative for rash.  Neurological:  Negative for loss of consciousness and headaches.      Objective:     BP 114/70 (BP Location: Left Arm, Patient Position: Sitting, Cuff Size: Normal)   Pulse (!) 53   Temp 97.8 F (36.6 C) (Oral)   Resp 18   Ht 5\' 5"  (1.651 m)   Wt 169 lb 6.4 oz (76.8 kg)   SpO2 97%   BMI 28.19 kg/m  BP Readings from Last 3 Encounters:  11/29/23 114/70  09/28/23 118/80  07/23/23 124/78   Wt Readings from Last 3 Encounters:  11/29/23 169 lb 6.4 oz (76.8 kg)  09/28/23 171 lb 3.2 oz (77.7 kg)  07/23/23 176 lb 6.4 oz (80 kg)   SpO2 Readings from Last 3 Encounters:  11/29/23 97%  09/28/23 97%  07/23/23 98%      Physical Exam Vitals and nursing note reviewed.  Constitutional:      General: She is not in acute distress.    Appearance: Normal appearance. She is well-developed.  HENT:     Head: Normocephalic and atraumatic.  Eyes:     General: No scleral icterus.       Right eye: No discharge.        Left eye: No discharge.  Cardiovascular:     Rate and Rhythm: Normal rate and regular rhythm.     Heart sounds: No murmur heard. Pulmonary:     Effort: Pulmonary effort is normal. No respiratory distress.     Breath sounds: Normal breath sounds.  Musculoskeletal:        General: Normal range of motion.     Cervical back: Normal range of motion and neck supple.     Right lower leg: No edema.     Left lower leg: No edema.  Skin:    General: Skin is warm and dry.  Neurological:     Mental Status: She is alert and oriented to person, place, and time.  Psychiatric:        Mood and Affect: Mood is depressed. Affect is blunt.        Behavior: Behavior normal.        Thought Content: Thought content normal.        Judgment: Judgment normal.      No results found for any visits on 11/29/23.  Last  CBC Lab Results  Component Value Date   WBC 6.1 09/28/2023   HGB 12.4 09/28/2023   HCT 38.6 09/28/2023   MCV 85.3 09/28/2023   MCH 27.3 05/11/2020   RDW 13.6 09/28/2023   PLT 374.0 09/28/2023   Last metabolic panel Lab Results  Component Value Date   GLUCOSE 88 09/28/2023   NA 137 09/28/2023   K 3.9 09/28/2023   CL 96 09/28/2023   CO2 33 (H) 09/28/2023   BUN 31 (H) 09/28/2023   CREATININE 1.78 (H) 09/28/2023   GFR 27.52 (L) 09/28/2023   CALCIUM 9.6 09/28/2023   PROT 7.7 09/28/2023   ALBUMIN 4.5 09/28/2023   BILITOT 0.5 09/28/2023   ALKPHOS 48 09/28/2023   AST 14 09/28/2023   ALT 8 09/28/2023   ANIONGAP 9 02/24/2015   Last lipids Lab Results  Component Value Date   CHOL 324 (H) 09/28/2023   HDL 54.50 09/28/2023   LDLCALC 255 (H) 09/28/2023   LDLDIRECT 215.7 10/18/2011   TRIG 74.0 09/28/2023   CHOLHDL 6 09/28/2023   Last hemoglobin A1c Lab Results  Component Value Date   HGBA1C 6.4 09/14/2014   Last thyroid functions Lab Results  Component Value Date   TSH 1.92 09/28/2023   T4TOTAL 7.7 11/16/2015   Last vitamin D Lab Results  Component Value Date   VD25OH 47 05/11/2020   Last vitamin B12 and Folate Lab Results  Component Value Date   VITAMINB12 566 09/28/2023    The ASCVD Risk score (Arnett DK, et al., 2019) failed to calculate for the following reasons:   The valid total cholesterol range is 130 to 320 mg/dL    Assessment & Plan:   Problem List Items Addressed This Visit       Unprioritized   GENERALIZED ANXIETY DISORDER   Relevant Medications   desvenlafaxine (PRISTIQ) 50 MG 24 hr tablet   Hyperlipidemia LDL goal <100   Relevant Medications   amLODipine (NORVASC) 5 MG tablet   chlorthalidone (HYGROTON) 25 MG tablet   fenofibrate 160 MG tablet   rosuvastatin (CRESTOR) 40 MG tablet   olmesartan (BENICAR) 40 MG tablet   Other Relevant Orders   CBC with Differential/Platelet   Comprehensive metabolic panel   Hypothyroidism   Relevant  Orders   TSH   Essential hypertension   Relevant Medications   amLODipine (NORVASC) 5 MG tablet   chlorthalidone (HYGROTON) 25 MG tablet   fenofibrate 160 MG tablet   rosuvastatin (CRESTOR) 40 MG tablet   olmesartan (BENICAR) 40 MG tablet   Other Relevant Orders   CBC with Differential/Platelet   Comprehensive metabolic panel   Z61 deficiency - Primary   Relevant Orders   Vitamin B12   WEIGHT LOSS   Only 7 lbs since last office visit Check labs  ? Depression       Other Visit Diagnoses       Other fatigue       Relevant Orders   CBC with Differential/Platelet   Comprehensive metabolic panel   Lipid  panel   TSH   Vitamin B12   VITAMIN D 25 Hydroxy (Vit-D Deficiency, Fractures)     DOE (dyspnea on exertion)       Relevant Orders   EKG 12-Lead (Completed)     Depression with anxiety       Relevant Medications   desvenlafaxine (PRISTIQ) 50 MG 24 hr tablet   Other Relevant Orders   Ambulatory referral to Behavioral Health     Assessment and Plan    Fatigue   Chronic fatigue with exertional dyspnea is noted, possibly due to hypotension and depression. There is no chest pain or palpitations, and cardiac and pulmonary auscultation are normal. Hypotension may contribute to her fatigue. Order an EKG to assess cardiac function and perform blood work to evaluate for anemia, thyroid function, and other potential causes of fatigue.  Depression   Depression may be related to stress from caregiving for her husband with sarcoidosis. She has lost 7 pounds since November, likely linked to depression. The impact of depression on appetite and weight was discussed.  Hypertension   She is on amlodipine, chlorthalidone, olmesartan, and metoprolol, but not taking hydrochlorothiazide. Hypotension may contribute to fatigue. Continue the current antihypertensive regimen and monitor blood pressure regularly.  General Health Maintenance   She is due for a colonoscopy in June, three years  since the last one. B12 supplementation is ongoing with previously normal levels. Schedule the colonoscopy for June.        Return in about 6 months (around 05/31/2024).    Donato Schultz, DO

## 2023-12-01 ENCOUNTER — Other Ambulatory Visit: Payer: Self-pay | Admitting: Family Medicine

## 2023-12-01 DIAGNOSIS — B356 Tinea cruris: Secondary | ICD-10-CM

## 2023-12-03 ENCOUNTER — Telehealth: Payer: Self-pay | Admitting: Pharmacy Technician

## 2023-12-03 ENCOUNTER — Other Ambulatory Visit (HOSPITAL_COMMUNITY): Payer: Self-pay

## 2023-12-03 MED ORDER — NYSTATIN 100000 UNIT/GM EX CREA: 1.0000 | TOPICAL_CREAM | Freq: Two times a day (BID) | CUTANEOUS | 0 refills | Status: AC

## 2023-12-03 NOTE — Telephone Encounter (Signed)
 Pharmacy Patient Advocate Encounter   Received notification from CoverMyMeds that prior authorization for  is Desvenlafaxine Succinate ER 50MG  TABLETS required/requested.   Insurance verification completed.   The patient is insured through [facility] .   Per test claim: The current 30 day co-pay is, $44.27.  No PA needed at this time. This test claim was processed through [facility]- copay amounts may vary at other pharmacies due to pharmacy/plan contracts, or as the patient moves through the different stages of their insurance plan.

## 2023-12-07 ENCOUNTER — Encounter: Payer: Self-pay | Admitting: Family Medicine

## 2023-12-13 ENCOUNTER — Ambulatory Visit
Admission: RE | Admit: 2023-12-13 | Discharge: 2023-12-13 | Disposition: A | Discharge status: Home / Self Care | Source: Ambulatory Visit | Attending: Family Medicine | Admitting: Family Medicine

## 2023-12-13 DIAGNOSIS — Z1231 Encounter for screening mammogram for malignant neoplasm of breast: Secondary | ICD-10-CM | POA: Diagnosis not present

## 2024-03-13 ENCOUNTER — Other Ambulatory Visit: Payer: Self-pay | Admitting: Family Medicine

## 2024-03-13 DIAGNOSIS — T7840XA Allergy, unspecified, initial encounter: Secondary | ICD-10-CM

## 2024-04-10 ENCOUNTER — Telehealth: Payer: Self-pay | Admitting: Family Medicine

## 2024-04-10 NOTE — Telephone Encounter (Signed)
 Copied from CRM [id number]. Topic: Medicare AWV >> Apr 10, 2024  1:30 PM Nathanel DEL wrote: Reason for CRM: Called LVM 04/10/2024 to schedule AWV. Please schedule Virtual or Telehealth visits ONLY.   Nathanel Paschal; Care Guide Ambulatory Clinical Support [place] [facility] Direct Dial: [phone]

## 2024-04-15 ENCOUNTER — Ambulatory Visit: Payer: Self-pay | Admitting: Family Medicine

## 2024-04-15 ENCOUNTER — Encounter: Payer: Self-pay | Admitting: Family Medicine

## 2024-04-15 ENCOUNTER — Ambulatory Visit (INDEPENDENT_AMBULATORY_CARE_PROVIDER_SITE_OTHER): Admitting: Family Medicine

## 2024-04-15 VITALS — BP 136/68 | HR 72 | Temp 98.1°F | Resp 18 | Ht 65.0 in | Wt 170.0 lb

## 2024-04-15 DIAGNOSIS — F411 Generalized anxiety disorder: Secondary | ICD-10-CM | POA: Diagnosis not present

## 2024-04-15 DIAGNOSIS — R350 Frequency of micturition: Secondary | ICD-10-CM | POA: Diagnosis not present

## 2024-04-15 LAB — POCT URINALYSIS DIPSTICK
Blood, UA: NEGATIVE
Glucose, UA: NEGATIVE
Ketones, UA: NEGATIVE
Leukocytes, UA: NEGATIVE
Nitrite, UA: NEGATIVE
Protein, UA: NEGATIVE
Spec Grav, UA: <=1.005 — AB (ref 1.010–1.025)
Urobilinogen, UA: 0.2 U/dL
pH, UA: 7.5 (ref 5.0–8.0)

## 2024-04-15 MED ORDER — PHENAZOPYRIDINE HCL 200 MG PO TABS: 200.0000 mg | ORAL_TABLET | Freq: Three times a day (TID) | ORAL | 0 refills | Status: AC

## 2024-04-15 MED ORDER — CEPHALEXIN 500 MG PO CAPS: 500.0000 mg | ORAL_CAPSULE | Freq: Two times a day (BID) | ORAL | 0 refills | Status: DC

## 2024-04-15 MED ORDER — ALPRAZOLAM 0.25 MG PO TABS: 0.2500 mg | ORAL_TABLET | Freq: Two times a day (BID) | ORAL | 0 refills | Status: AC | PRN

## 2024-04-15 NOTE — Progress Notes (Signed)
 Subjective:    Patient ID: Wendy Christensen, female    DOB: 1946-02-15, 78 y.o.   MRN: [id number]  Chief Complaint  Patient presents with   Urinary Frequency    Onset: gradual     HPI Patient is in today for painful urination and frequency x 2 months.  Discussed the use of AI scribe software for clinical note transcription with the patient, who gave verbal consent to proceed.  History of Present Illness Wendy Christensen is a 78 year old female who presents with urinary discomfort and frequent urination.  She has been experiencing urinary discomfort and frequent urination for approximately two months, with symptoms including nocturia and occasional urgency that sometimes results in not reaching the bathroom in time. Urination is sometimes painful, but there is no stomach or lower abdominal pain.  No incontinence with activities such as coughing or bending over. She has not noticed any discharge or blood in her urine. Over-the-counter AZO has provided some relief.  She is currently taking anxiety medication, which she refilled at [facility].  No stomach pain, lower abdominal pain, incontinence with coughing or bending over, discharge, or blood in her urine.    Past Medical History:  Diagnosis Date   Allergic rhinitis    Allergy     seasonal   Anxiety    Asthma, intrinsic    Cataract    bilateral repair   Depression    Frequency of urination    GERD (gastroesophageal reflux disease)    Hyperlipidemia    Hypertension    Kidney stone    Left kidney-calcium     OSA on CPAP    PT IN PROCESS OF GETTING MACHINE SET UP   Sleep apnea    cpap   Thyroid  goiter    Urgency of urination    Wears glasses     Past Surgical History:  Procedure Laterality Date   BREAST BIOPSY     CATARACT EXTRACTION, BILATERAL     COLONOSCOPY  2013   CYSTOSCOPY WITH RETROGRADE PYELOGRAM, URETEROSCOPY AND STENT PLACEMENT Left 06/21/2015   Procedure: CYSTOSCOPY WITH RETROGRADE PYELOGRAM,  URETEROSCOPY AND STENT PLACEMENT;  Surgeon: Redell Lynwood Napoleon, MD;  Location: [facility];  Service: Urology;  Laterality: Left;   DILATION AND CURETTAGE OF UTERUS  1990   W/ HYSTEROSCOPY   EYE SURGERY     retina both eyes 6/19   HOLMIUM LASER APPLICATION Left 06/21/2015   Procedure: HOLMIUM LASER APPLICATION;  Surgeon: Redell Lynwood Napoleon, MD;  Location: [facility];  Service: Urology;  Laterality: Left;   STONE EXTRACTION WITH BASKET Left 06/21/2015   Procedure: STONE EXTRACTION WITH BASKET;  Surgeon: Redell Lynwood Napoleon, MD;  Location: [facility];  Service: Urology;  Laterality: Left;   TUBAL LIGATION  1980's    Family History  Problem Relation Age of Onset   Hypertension Mother    Heart disease Mother    Stroke Mother    Heart attack Mother    COPD Father    Asthma Father    Stroke Brother    Hypertension Brother    Hypertension Brother    Kidney cancer Maternal Aunt    Heart attack Maternal Aunt    Breast cancer Cousin    Breast cancer Cousin    Hyperlipidemia Other    Hypertension Other    Colon cancer Neg Hx    Colon polyps Neg Hx    Esophageal cancer Neg Hx    Rectal cancer Neg Hx  Stomach cancer Neg Hx     Social History   Socioeconomic History   Marital status: Married    Spouse name: Not on file   Number of children: Not on file   Years of education: Not on file   Highest education level: Associate degree: academic program  Occupational History   Occupation: Retired    Associate Professor: DISABLE  Tobacco Use   Smoking status: Never   Smokeless tobacco: Never  Vaping Use   Vaping status: Never Used  Substance and Sexual Activity   Alcohol use: Not Currently    Alcohol/week: 0.0 standard drinks of alcohol   Drug use: No   Sexual activity: Not on file  Other Topics Concern   Not on file  Social History Narrative   Youth worker , daycare    Social Drivers of Health   Financial Resource Strain: Medium Risk  (09/27/2023)   Overall Financial Resource Strain (CARDIA)    Difficulty of Paying Living Expenses: Somewhat hard  Food Insecurity: Patient Declined (09/27/2023)   Hunger Vital Sign    Worried About Running Out of Food in the Last Year: Patient declined    Ran Out of Food in the Last Year: Patient declined  Transportation Needs: No Transportation Needs (09/27/2023)   PRAPARE - Administrator, Civil Service (Medical): No    Lack of Transportation (Non-Medical): No  Physical Activity: Insufficiently Active (09/27/2023)   Exercise Vital Sign    Days of Exercise per Week: 3 days    Minutes of Exercise per Session: 30 min  Stress: Stress Concern Present (09/27/2023)   [employer] of Occupational Health - Occupational Stress Questionnaire    Feeling of Stress : To some extent  Social Connections: Moderately Isolated (09/27/2023)   Social Connection and Isolation Panel    Frequency of Communication with Friends and Family: More than three times a week    Frequency of Social Gatherings with Friends and Family: Once a week    Attends Religious Services: Never    Database administrator or Organizations: No    Attends Engineer, structural: Not on file    Marital Status: Married  Catering manager Violence: Not on file    Outpatient Medications Prior to Visit  Medication Sig Dispense Refill   albuterol  (VENTOLIN  HFA) 108 (90 Base) MCG/ACT inhaler USE 2 INHALATIONS BY MOUTH  EVERY 4 HOURS AS NEEDED FOR WHEEZING 54 g 3   amLODipine  (NORVASC ) 5 MG tablet TAKE 1 TABLET BY MOUTH  DAILY 90 tablet 3   chlorthalidone  (HYGROTON ) 25 MG tablet Take 1 tablet (25 mg total) by mouth daily. 90 tablet 3   desvenlafaxine  (PRISTIQ ) 50 MG 24 hr tablet Take 1 tablet (50 mg total) by mouth daily. 30 tablet 3   fenofibrate  160 MG tablet Take 1 tablet (160 mg total) by mouth daily. 100 tablet 3   fluticasone  (FLONASE ) 50 MCG/ACT nasal spray PLACE 2 SPRAYS IN EACH NOSTRIL AS NEEDED 16 g 3   fluticasone   furoate-vilanterol (BREO ELLIPTA ) 200-25 MCG/ACT AEPB Inhale 1 puff then rinse mouth, once daily 180 each 3   hydrochlorothiazide  (HYDRODIURIL ) 25 MG tablet Take 1 tablet (25 mg total) by mouth daily as needed. 100 tablet 3   ipratropium (ATROVENT ) 0.06 % nasal spray Place 2 sprays into both nostrils 4 (four) times daily. As needed for nasal congestion, runny nose 15 mL 0   loratadine  (CLARITIN ) 10 MG tablet Take 1 tablet (10 mg total) by mouth daily. 90 tablet 3  metoprolol  tartrate (LOPRESSOR ) 100 MG tablet Take 1 tablet (100 mg total) by mouth 2 (two) times daily. 210 tablet 3   montelukast  (SINGULAIR ) 10 MG tablet Take 1 tablet (10 mg total) by mouth at bedtime. 90 tablet 0   NONFORMULARY OR COMPOUNDED ITEM Please dispense a blood pressure cuff to patient if approved through insurance. 1 each 1   nystatin  cream (MYCOSTATIN ) Apply 1 Application topically 2 (two) times daily. 30 g 0   olmesartan  (BENICAR ) 40 MG tablet Take 1 tablet (40 mg total) by mouth daily. 100 tablet 3   omeprazole  (PRILOSEC) 40 MG capsule Take 1 capsule (40 mg total) by mouth daily. Cancel 20 mg omeprazole  100 capsule 3   Propylene Glycol-Glycerin (MOISTURE EYES OP) Apply to eye as needed.     rosuvastatin  (CRESTOR ) 40 MG tablet Take 1 tablet (40 mg total) by mouth daily. 100 tablet 3   vitamin B-12 (CYANOCOBALAMIN ) 1000 MCG tablet Take 1,000 mcg by mouth daily.     ALPRAZolam  (XANAX ) 0.25 MG tablet Take 1 tablet (0.25 mg total) by mouth 2 (two) times daily as needed for anxiety. 20 tablet 0   No facility-administered medications prior to visit.    No Known Allergies  Review of Systems  Constitutional:  Negative for fever and malaise/fatigue.  HENT:  Negative for congestion.   Eyes:  Negative for blurred vision.  Respiratory:  Negative for cough and shortness of breath.   Cardiovascular:  Negative for chest pain, palpitations and leg swelling.  Gastrointestinal:  Negative for vomiting.  Genitourinary:  Positive  for dysuria, frequency and urgency. Negative for flank pain and hematuria.  Musculoskeletal:  Negative for back pain.  Skin:  Negative for rash.  Neurological:  Negative for loss of consciousness and headaches.       Objective:    Physical Exam Vitals and nursing note reviewed.  Constitutional:      General: She is not in acute distress.    Appearance: Normal appearance. She is well-developed.  HENT:     Head: Normocephalic and atraumatic.  Eyes:     General: No scleral icterus.       Right eye: No discharge.        Left eye: No discharge.  Cardiovascular:     Rate and Rhythm: Normal rate and regular rhythm.     Heart sounds: No murmur heard. Pulmonary:     Effort: Pulmonary effort is normal. No respiratory distress.     Breath sounds: Normal breath sounds.  Musculoskeletal:        General: Normal range of motion.     Cervical back: Normal range of motion and neck supple.     Right lower leg: No edema.     Left lower leg: No edema.  Skin:    General: Skin is warm and dry.  Neurological:     Mental Status: She is alert and oriented to person, place, and time.  Psychiatric:        Mood and Affect: Mood normal.        Behavior: Behavior normal.        Thought Content: Thought content normal.        Judgment: Judgment normal.     BP 136/68   Pulse 72   Temp 98.1 F (36.7 C)   Resp 18   Ht 5' 5 (1.651 m)   Wt 170 lb (77.1 kg)   SpO2 99%   BMI 28.29 kg/m  Wt Readings from Last 3 Encounters:  04/15/24 170 lb (77.1 kg)  11/29/23 169 lb 6.4 oz (76.8 kg)  09/28/23 171 lb 3.2 oz (77.7 kg)    Diabetic Foot Exam - Simple   No data filed    Lab Results  Component Value Date   WBC 6.4 11/29/2023   HGB 11.5 (L) 11/29/2023   HCT 36.1 11/29/2023   PLT 309.0 11/29/2023   GLUCOSE 90 11/29/2023   CHOL 224 (H) 11/29/2023   TRIG 57.0 11/29/2023   HDL 70.80 11/29/2023   LDLDIRECT 215.7 10/18/2011   LDLCALC 142 (H) 11/29/2023   ALT 10 11/29/2023   AST 17 11/29/2023    NA 139 11/29/2023   K 4.1 11/29/2023   CL 100 11/29/2023   CREATININE 1.65 (H) 11/29/2023   BUN 30 (H) 11/29/2023   CO2 32 11/29/2023   TSH 1.12 11/29/2023   HGBA1C 6.4 09/14/2014    Lab Results  Component Value Date   TSH 1.12 11/29/2023   Lab Results  Component Value Date   WBC 6.4 11/29/2023   HGB 11.5 (L) 11/29/2023   HCT 36.1 11/29/2023   MCV 84.5 11/29/2023   PLT 309.0 11/29/2023   Lab Results  Component Value Date   NA 139 11/29/2023   K 4.1 11/29/2023   CO2 32 11/29/2023   GLUCOSE 90 11/29/2023   BUN 30 (H) 11/29/2023   CREATININE 1.65 (H) 11/29/2023   BILITOT 0.5 11/29/2023   ALKPHOS 39 11/29/2023   AST 17 11/29/2023   ALT 10 11/29/2023   PROT 7.5 11/29/2023   ALBUMIN 4.5 11/29/2023   CALCIUM  9.9 11/29/2023   ANIONGAP 9 02/24/2015   GFR 30.10 (L) 11/29/2023   Lab Results  Component Value Date   CHOL 224 (H) 11/29/2023   Lab Results  Component Value Date   HDL 70.80 11/29/2023   Lab Results  Component Value Date   LDLCALC 142 (H) 11/29/2023   Lab Results  Component Value Date   TRIG 57.0 11/29/2023   Lab Results  Component Value Date   CHOLHDL 3 11/29/2023   Lab Results  Component Value Date   HGBA1C 6.4 09/14/2014       Assessment & Plan:  Urinary frequency -     POCT urinalysis dipstick -     Urine Culture -     Cephalexin ; Take 1 capsule (500 mg total) by mouth 2 (two) times daily.  Dispense: 14 capsule; Refill: 0 -     Phenazopyridine  HCl; Take 1 tablet (200 mg total) by mouth 3 (three) times daily with meals for 6 days.  Dispense: 6 tablet; Refill: 0  Generalized anxiety disorder -     ALPRAZolam ; Take 1 tablet (0.25 mg total) by mouth 2 (two) times daily as needed for anxiety.  Dispense: 20 tablet; Refill: 0   Assessment and Plan Assessment & Plan Urinary symptoms (dysuria, frequency, urgency)   Urinary symptoms, including dysuria, frequency, and urgency, have persisted for two months. Urinalysis showed bacteria but no  nitrates, suggesting a possible urinary tract infection. Symptoms cause significant discomfort and affect sleep. Send urine for culture. Prescribe antibiotics empirically pending culture results. Prescribe prescription strength phenazopyridine  for symptomatic relief and advise it will cause orange discoloration of urine. Inform that antibiotics can be stopped if culture is negative.  Anxiety disorder   Anxiety disorder is managed with medication. She reports increased stress due to her husband's potential early-stage dementia. Refill anxiety medication.   Marq Rebello R Lowne Chase, DO

## 2024-04-16 LAB — URINE CULTURE
MICRO NUMBER:: 16759116
SPECIMEN QUALITY:: ADEQUATE

## 2024-04-18 ENCOUNTER — Other Ambulatory Visit: Payer: Self-pay | Admitting: Family Medicine

## 2024-04-18 DIAGNOSIS — K219 Gastro-esophageal reflux disease without esophagitis: Secondary | ICD-10-CM

## 2024-05-05 DIAGNOSIS — Z961 Presence of intraocular lens: Secondary | ICD-10-CM | POA: Diagnosis not present

## 2024-05-05 DIAGNOSIS — H524 Presbyopia: Secondary | ICD-10-CM | POA: Diagnosis not present

## 2024-05-05 DIAGNOSIS — Z9849 Cataract extraction status, unspecified eye: Secondary | ICD-10-CM | POA: Diagnosis not present

## 2024-05-05 DIAGNOSIS — H53143 Visual discomfort, bilateral: Secondary | ICD-10-CM | POA: Diagnosis not present

## 2024-05-12 ENCOUNTER — Other Ambulatory Visit: Payer: Self-pay | Admitting: Family Medicine

## 2024-05-12 DIAGNOSIS — T7840XA Allergy, unspecified, initial encounter: Secondary | ICD-10-CM

## 2024-05-13 DIAGNOSIS — N1832 Chronic kidney disease, stage 3b: Secondary | ICD-10-CM | POA: Diagnosis not present

## 2024-05-13 DIAGNOSIS — N200 Calculus of kidney: Secondary | ICD-10-CM | POA: Diagnosis not present

## 2024-05-13 DIAGNOSIS — I129 Hypertensive chronic kidney disease with stage 1 through stage 4 chronic kidney disease, or unspecified chronic kidney disease: Secondary | ICD-10-CM | POA: Diagnosis not present

## 2024-06-16 ENCOUNTER — Ambulatory Visit (INDEPENDENT_AMBULATORY_CARE_PROVIDER_SITE_OTHER): Admitting: Family Medicine

## 2024-06-16 ENCOUNTER — Ambulatory Visit: Admitting: Family Medicine

## 2024-06-16 ENCOUNTER — Encounter: Payer: Self-pay | Admitting: Family Medicine

## 2024-06-16 VITALS — BP 124/70 | HR 55 | Temp 98.1°F | Resp 18 | Ht 65.0 in | Wt 173.0 lb

## 2024-06-16 DIAGNOSIS — E785 Hyperlipidemia, unspecified: Secondary | ICD-10-CM

## 2024-06-16 DIAGNOSIS — I10 Essential (primary) hypertension: Secondary | ICD-10-CM | POA: Diagnosis not present

## 2024-06-16 DIAGNOSIS — T7840XA Allergy, unspecified, initial encounter: Secondary | ICD-10-CM

## 2024-06-16 DIAGNOSIS — J3089 Other allergic rhinitis: Secondary | ICD-10-CM | POA: Diagnosis not present

## 2024-06-16 DIAGNOSIS — Z8709 Personal history of other diseases of the respiratory system: Secondary | ICD-10-CM

## 2024-06-16 DIAGNOSIS — E039 Hypothyroidism, unspecified: Secondary | ICD-10-CM | POA: Diagnosis not present

## 2024-06-16 DIAGNOSIS — E538 Deficiency of other specified B group vitamins: Secondary | ICD-10-CM | POA: Diagnosis not present

## 2024-06-16 DIAGNOSIS — F411 Generalized anxiety disorder: Secondary | ICD-10-CM

## 2024-06-16 DIAGNOSIS — Z23 Encounter for immunization: Secondary | ICD-10-CM

## 2024-06-16 LAB — LIPID PANEL
Cholesterol: 181 mg/dL (ref 0–200)
HDL: 56.1 mg/dL (ref >39.00)
LDL Cholesterol: 107 mg/dL — ABNORMAL HIGH (ref 0–99)
NonHDL: 124.49
Total CHOL/HDL Ratio: 3
Triglycerides: 88 mg/dL (ref 0.0–149.0)
VLDL: 17.6 mg/dL (ref 0.0–40.0)

## 2024-06-16 LAB — COMPREHENSIVE METABOLIC PANEL WITH GFR
ALT: 10 U/L (ref 0–35)
AST: 17 U/L (ref 0–37)
Albumin: 4.3 g/dL (ref 3.5–5.2)
Alkaline Phosphatase: 53 U/L (ref 39–117)
BUN: 18 mg/dL (ref 6–23)
CO2: 31 meq/L (ref 19–32)
Calcium: 9.4 mg/dL (ref 8.4–10.5)
Chloride: 103 meq/L (ref 96–112)
Creatinine, Ser: 1.18 mg/dL (ref 0.40–1.20)
GFR: 44.84 mL/min — ABNORMAL LOW (ref >60.00)
Glucose, Bld: 88 mg/dL (ref 70–99)
Potassium: 4.2 meq/L (ref 3.5–5.1)
Sodium: 139 meq/L (ref 135–145)
Total Bilirubin: 0.4 mg/dL (ref 0.2–1.2)
Total Protein: 7.2 g/dL (ref 6.0–8.3)

## 2024-06-16 LAB — TSH: TSH: 1.59 u[IU]/mL (ref 0.35–5.50)

## 2024-06-16 MED ORDER — FLUTICASONE FUROATE-VILANTEROL 200-25 MCG/ACT IN AEPB: INHALATION_SPRAY | RESPIRATORY_TRACT | 3 refills | Status: DC

## 2024-06-16 MED ORDER — MONTELUKAST SODIUM 10 MG PO TABS: 10.0000 mg | ORAL_TABLET | Freq: Every day | ORAL | 1 refills | Status: AC

## 2024-06-16 MED ORDER — ALBUTEROL SULFATE HFA 108 (90 BASE) MCG/ACT IN AERS: INHALATION_SPRAY | RESPIRATORY_TRACT | 3 refills | Status: AC

## 2024-06-16 NOTE — Assessment & Plan Note (Signed)
 Refill inhalers--- breo and albuterol   Refill singular

## 2024-06-16 NOTE — Assessment & Plan Note (Signed)
 Stable

## 2024-06-16 NOTE — Assessment & Plan Note (Signed)
 Lab Results  Component Value Date   TSH 1.12 11/29/2023   Con't synthroid

## 2024-06-16 NOTE — Assessment & Plan Note (Signed)
 Well controlled, no changes to meds. Encouraged heart healthy diet such as the DASH diet and exercise as tolerated.

## 2024-06-16 NOTE — Progress Notes (Signed)
 Subjective:    Patient ID: Wendy Christensen, female    DOB: 1946/02/01, 78 y.o.   MRN: [id number]  Chief Complaint  Patient presents with   Anxiety   Follow-up    HPI Patient is in today for f/u anxiety, bp, cholesterol , thyroid .   Discussed the use of AI scribe software for clinical note transcription with the patient, who gave verbal consent to proceed.  History of Present Illness Wendy Christensen is a 78 year old female who presents for medication refills and a flu shot.  She has had a challenging year with allergies, primarily presenting as nasal drip. She manages her symptoms with Breo 200 mcg, an albuterol  inhaler, and Singulair , and requires refills for these medications. Her breathing has been stable today. She attributes her allergy  issues to environmental factors, such as pollen and mold from wet leaves in her backyard.  Her anxiety is well-controlled, and she feels good overall. She maintains an active lifestyle by working two days a week at [employer] and caring for her grandchildren, which she finds keeps her 'moving and level'. Additionally, she spends time with a 96 year old lady, which she finds enjoyable and fulfilling.                                                                                                                                               Past Medical History:  Diagnosis Date   Allergic rhinitis    Allergy     seasonal   Anxiety    Asthma, intrinsic    Cataract    bilateral repair   Depression    Frequency of urination    GERD (gastroesophageal reflux disease)    Hyperlipidemia    Hypertension    Kidney stone    Left kidney-calcium     OSA on CPAP    PT IN PROCESS OF GETTING MACHINE SET UP   Sleep apnea    cpap   Thyroid  goiter    Urgency of urination    Wears glasses     Past Surgical  History:  Procedure Laterality Date   BREAST BIOPSY     CATARACT EXTRACTION, BILATERAL     COLONOSCOPY  2013   CYSTOSCOPY WITH RETROGRADE PYELOGRAM, URETEROSCOPY AND STENT PLACEMENT Left 06/21/2015   Procedure: CYSTOSCOPY WITH RETROGRADE PYELOGRAM, URETEROSCOPY AND STENT PLACEMENT;  Surgeon: Redell Lynwood Napoleon, MD;  Location: [facility];  Service: Urology;  Laterality: Left;   DILATION AND CURETTAGE OF UTERUS  1990   W/ HYSTEROSCOPY   EYE SURGERY     retina both eyes 6/19   HOLMIUM LASER APPLICATION Left 06/21/2015   Procedure: HOLMIUM LASER APPLICATION;  Surgeon: Redell Lynwood Napoleon, MD;  Location: [facility];  Service: Urology;  Laterality: Left;   STONE EXTRACTION WITH BASKET Left 06/21/2015   Procedure: STONE EXTRACTION WITH BASKET;  Surgeon: Redell  Lynwood Napoleon, MD;  Location: [facility];  Service: Urology;  Laterality: Left;   TUBAL LIGATION  1980's    Family History  Problem Relation Age of Onset   Hypertension Mother    Heart disease Mother    Stroke Mother    Heart attack Mother    COPD Father    Asthma Father    Stroke Brother    Hypertension Brother    Hypertension Brother    Kidney cancer Maternal Aunt    Heart attack Maternal Aunt    Breast cancer Cousin    Breast cancer Cousin    Hyperlipidemia Other    Hypertension Other    Colon cancer Neg Hx    Colon polyps Neg Hx    Esophageal cancer Neg Hx    Rectal cancer Neg Hx    Stomach cancer Neg Hx     Social History   Socioeconomic History   Marital status: Married    Spouse name: Not on file   Number of children: Not on file   Years of education: Not on file   Highest education level: Associate degree: academic program  Occupational History   Occupation: Retired    Associate Professor: DISABLE  Tobacco Use   Smoking status: Never   Smokeless tobacco: Never  Vaping Use   Vaping status: Never Used  Substance and Sexual Activity   Alcohol use: Not Currently     Alcohol/week: 0.0 standard drinks of alcohol   Drug use: No   Sexual activity: Not on file  Other Topics Concern   Not on file  Social History Narrative   Youth worker , daycare    Social Drivers of Health   Financial Resource Strain: Medium Risk (09/27/2023)   Overall Financial Resource Strain (CARDIA)    Difficulty of Paying Living Expenses: Somewhat hard  Food Insecurity: Patient Declined (09/27/2023)   Hunger Vital Sign    Worried About Running Out of Food in the Last Year: Patient declined    Ran Out of Food in the Last Year: Patient declined  Transportation Needs: No Transportation Needs (09/27/2023)   PRAPARE - Administrator, Civil Service (Medical): No    Lack of Transportation (Non-Medical): No  Physical Activity: Insufficiently Active (09/27/2023)   Exercise Vital Sign    Days of Exercise per Week: 3 days    Minutes of Exercise per Session: 30 min  Stress: Stress Concern Present (09/27/2023)   [employer] of Occupational Health - Occupational Stress Questionnaire    Feeling of Stress : To some extent  Social Connections: Moderately Isolated (09/27/2023)   Social Connection and Isolation Panel    Frequency of Communication with Friends and Family: More than three times a week    Frequency of Social Gatherings with Friends and Family: Once a week    Attends Religious Services: Never    Database administrator or Organizations: No    Attends Engineer, structural: Not on file    Marital Status: Married  Catering manager Violence: Not on file    Outpatient Medications Prior to Visit  Medication Sig Dispense Refill   ALPRAZolam  (XANAX ) 0.25 MG tablet Take 1 tablet (0.25 mg total) by mouth 2 (two) times daily as needed for anxiety. 20 tablet 0   amLODipine  (NORVASC ) 5 MG tablet TAKE 1 TABLET BY MOUTH  DAILY 90 tablet 3   chlorthalidone  (HYGROTON ) 25 MG tablet Take 1 tablet (25 mg total) by mouth daily. 90 tablet 3  fenofibrate  160 MG tablet Take 1  tablet (160 mg total) by mouth daily. 100 tablet 3   fluticasone  (FLONASE ) 50 MCG/ACT nasal spray PLACE 2 SPRAYS IN EACH NOSTRIL AS NEEDED 16 g 3   ipratropium (ATROVENT ) 0.06 % nasal spray Place 2 sprays into both nostrils 4 (four) times daily. As needed for nasal congestion, runny nose 15 mL 0   loratadine  (CLARITIN ) 10 MG tablet Take 1 tablet (10 mg total) by mouth daily. 90 tablet 3   metoprolol  tartrate (LOPRESSOR ) 100 MG tablet Take 1 tablet (100 mg total) by mouth 2 (two) times daily. 210 tablet 3   NONFORMULARY OR COMPOUNDED ITEM Please dispense a blood pressure cuff to patient if approved through insurance. 1 each 1   nystatin  cream (MYCOSTATIN ) Apply 1 Application topically 2 (two) times daily. 30 g 0   olmesartan  (BENICAR ) 40 MG tablet Take 1 tablet (40 mg total) by mouth daily. 100 tablet 3   omeprazole  (PRILOSEC) 40 MG capsule TAKE 1 CAPSULE BY MOUTH DAILY 100 capsule 2   rosuvastatin  (CRESTOR ) 40 MG tablet Take 1 tablet (40 mg total) by mouth daily. 100 tablet 3   vitamin B-12 (CYANOCOBALAMIN ) 1000 MCG tablet Take 1,000 mcg by mouth daily.     albuterol  (VENTOLIN  HFA) 108 (90 Base) MCG/ACT inhaler USE 2 INHALATIONS BY MOUTH  EVERY 4 HOURS AS NEEDED FOR WHEEZING 54 g 3   cephALEXin  (KEFLEX ) 500 MG capsule Take 1 capsule (500 mg total) by mouth 2 (two) times daily. 14 capsule 0   fluticasone  furoate-vilanterol (BREO ELLIPTA ) 200-25 MCG/ACT AEPB Inhale 1 puff then rinse mouth, once daily 180 each 3   montelukast  (SINGULAIR ) 10 MG tablet TAKE 1 TABLET BY MOUTH AT  BEDTIME 90 tablet 1   desvenlafaxine  (PRISTIQ ) 50 MG 24 hr tablet Take 1 tablet (50 mg total) by mouth daily. (Patient not taking: Reported on 06/16/2024) 30 tablet 3   hydrochlorothiazide  (HYDRODIURIL ) 25 MG tablet Take 1 tablet (25 mg total) by mouth daily as needed. (Patient not taking: Reported on 06/16/2024) 100 tablet 3   Propylene Glycol-Glycerin (MOISTURE EYES OP) Apply to eye as needed. (Patient not taking: Reported on  06/16/2024)     No facility-administered medications prior to visit.    No Known Allergies  Review of Systems  Constitutional:  Negative for fever and malaise/fatigue.  HENT:  Negative for congestion.   Eyes:  Negative for blurred vision.  Respiratory:  Negative for cough and shortness of breath.   Cardiovascular:  Negative for chest pain, palpitations and leg swelling.  Gastrointestinal:  Negative for abdominal pain, blood in stool, nausea and vomiting.  Genitourinary:  Negative for dysuria and frequency.  Musculoskeletal:  Negative for back pain and falls.  Skin:  Negative for rash.  Neurological:  Negative for dizziness, loss of consciousness and headaches.  Endo/Heme/Allergies:  Negative for environmental allergies.  Psychiatric/Behavioral:  Negative for depression. The patient is not nervous/anxious.        Objective:    Physical Exam  BP 124/70 (BP Location: Left Arm, Patient Position: Sitting, Cuff Size: Large)   Pulse (!) 55   Temp 98.1 F (36.7 C) (Oral)   Resp 18   Ht 5' 5 (1.651 m)   Wt 173 lb (78.5 kg)   SpO2 98%   BMI 28.79 kg/m  Wt Readings from Last 3 Encounters:  06/16/24 173 lb (78.5 kg)  04/15/24 170 lb (77.1 kg)  11/29/23 169 lb 6.4 oz (76.8 kg)    Diabetic Foot Exam -  Simple   No data filed    Lab Results  Component Value Date   WBC 6.4 11/29/2023   HGB 11.5 (L) 11/29/2023   HCT 36.1 11/29/2023   PLT 309.0 11/29/2023   GLUCOSE 90 11/29/2023   CHOL 224 (H) 11/29/2023   TRIG 57.0 11/29/2023   HDL 70.80 11/29/2023   LDLDIRECT 215.7 10/18/2011   LDLCALC 142 (H) 11/29/2023   ALT 10 11/29/2023   AST 17 11/29/2023   NA 139 11/29/2023   K 4.1 11/29/2023   CL 100 11/29/2023   CREATININE 1.65 (H) 11/29/2023   BUN 30 (H) 11/29/2023   CO2 32 11/29/2023   TSH 1.12 11/29/2023   HGBA1C 6.4 09/14/2014    Lab Results  Component Value Date   TSH 1.12 11/29/2023   Lab Results  Component Value Date   WBC 6.4 11/29/2023   HGB 11.5 (L)  11/29/2023   HCT 36.1 11/29/2023   MCV 84.5 11/29/2023   PLT 309.0 11/29/2023   Lab Results  Component Value Date   NA 139 11/29/2023   K 4.1 11/29/2023   CO2 32 11/29/2023   GLUCOSE 90 11/29/2023   BUN 30 (H) 11/29/2023   CREATININE 1.65 (H) 11/29/2023   BILITOT 0.5 11/29/2023   ALKPHOS 39 11/29/2023   AST 17 11/29/2023   ALT 10 11/29/2023   PROT 7.5 11/29/2023   ALBUMIN 4.5 11/29/2023   CALCIUM  9.9 11/29/2023   ANIONGAP 9 02/24/2015   GFR 30.10 (L) 11/29/2023   Lab Results  Component Value Date   CHOL 224 (H) 11/29/2023   Lab Results  Component Value Date   HDL 70.80 11/29/2023   Lab Results  Component Value Date   LDLCALC 142 (H) 11/29/2023   Lab Results  Component Value Date   TRIG 57.0 11/29/2023   Lab Results  Component Value Date   CHOLHDL 3 11/29/2023   Lab Results  Component Value Date   HGBA1C 6.4 09/14/2014       Assessment & Plan:  Essential hypertension Assessment & Plan: Well controlled, no changes to meds. Encouraged heart healthy diet such as the DASH diet and exercise as tolerated.    Orders: -     Comprehensive metabolic panel with GFR  History of asthma Assessment & Plan: Refill inhalers--- breo and albuterol   Refill singular   Orders: -     Albuterol  Sulfate HFA; USE 2 INHALATIONS BY MOUTH  EVERY 4 HOURS AS NEEDED FOR WHEEZING  Dispense: 54 g; Refill: 3 -     Fluticasone  Furoate-Vilanterol; Inhale 1 puff then rinse mouth, once daily  Dispense: 180 each; Refill: 3  Allergy , initial encounter -     Montelukast  Sodium; Take 1 tablet (10 mg total) by mouth at bedtime.  Dispense: 90 tablet; Refill: 1  Hyperlipidemia LDL goal <100 -     Lipid panel  B12 deficiency  Hypothyroidism, unspecified type Assessment & Plan: Lab Results  Component Value Date   TSH 1.12 11/29/2023   Con't synthroid  Orders: -     TSH  Need for influenza vaccination -     Flu vaccine HIGH DOSE PF(Fluzone Trivalent)  Primary  hypertension Assessment & Plan: Well controlled, no changes to meds. Encouraged heart healthy diet such as the DASH diet and exercise as tolerated.     Anxiety state Assessment & Plan: Stable      Jamee JONELLE Antonio Cyndee, DO

## 2024-06-22 ENCOUNTER — Ambulatory Visit: Payer: Self-pay | Admitting: Family Medicine

## 2024-07-14 NOTE — Progress Notes (Addendum)
 HPI F married followed for OSA, complicated by HTN, CKD, Allergic Rhinitis, Asthma, GERD, Goiter/ Hypothyroid, Anxiety/ Depression, Hyperlipidemia, Insomnia, Covid infection Sept2023, HST 01/27/13- AHI 17.6/ hr, desaturation to 87%, body weight 191 lbs  CPAP to 10  ======================================================    07/16/23- 77 yoF married followed for OSA, complicated by HTN, CKD, Allergic Rhinitis, Asthma, GERD, Goiter/ Hypothyroid, Anxiety/ Depression, Hyperlipidemia, Insomnia, Covid infection Sept2023, - albuterol  hfa, Breo 200,  CPAP auto 5-15/ [facility]  replacement order 04/11/22 Download compliance-  Body weight today-  ----OSA, Congestion in ear on left ear Has had mild head congestion, esp L ear, x 1 week. Denies pain, sore throat, fever now. Chest ok. Sudafed not helping. Using CPAP replacement machine well- download reviewed. CXR 08/21/22- IMPRESSION: No active cardiopulmonary process. Stable mild biapical scarring.  07/15/24-78 yoF married followed for OSA, complicated by HTN, CKD, Allergic Rhinitis, Asthma, GERD, Goiter/ Hypothyroid, Anxiety/ Depression, Hyperlipidemia, Insomnia, Covid infection Sept2023, - albuterol  hfa, Breo 200,  CPAP auto 5-15/ [facility]  replacement order 04/11/22 Download compliance- no download  Body weight today- 171 lbs Blamed CPAP for L ear pressure, ok off CPAP. We discussed lowering pressure range to 4-10. Admits frequent waso and snoring off CPAP. Has used CPAP nightly and with benefit until ear problem. Notes wheeze- helps to use rescue hfa about twice daily. We will add Spiriva.   ROS-see HPI + = positive Constitutional:    weight loss, night sweats, fevers, chills, fatigue, lassitude. HEENT:    headaches, difficulty swallowing, +tooth/dental problems, sore throat,       sneezing,+ itching, ear ache, +nasal congestion, post nasal drip, snoring CV:    chest pain, orthopnea, PND, swelling in lower extremities, anasarca,                    dizziness, palpitations Resp:   +shortness of breath with exertion or at rest.                productive cough,   non-productive cough, coughing up of blood.              change in color of mucus.  wheezing.   Skin:    rash or lesions. GI:  +heartburn, indigestion, abdominal pain, nausea, vomiting, diarrhea,                 change in bowel habits, loss of appetite GU: dysuria, change in color of urine, no urgency or frequency.   flank pain. MS:   joint pain, stiffness, decreased range of motion, back pain. Neuro-     nothing unusual Psych:  change in mood or affect.  +depression or +anxiety.   memory loss.  OBJ- Physical Exam General- Alert, Oriented, Affect-appropriate, Distress- none acute Skin- rash-none, lesions- none, excoriation- none Lymphadenopathy- none Head- atraumatic            Eyes- Gross vision intact, PERRLA, conjunctivae and secretions clear            Ears- Cerumen R>L, L TM not red but retracted            Nose- Clear, no-Septal dev, mucus, polyps, erosion, perforation             Throat- Mallampati III-IV , mucosa clear , drainage- none, tonsils+, +teeth Neck- flexible , trachea midline, no stridor , thyroid  nl, carotid no bruit Chest - symmetrical excursion , unlabored           Heart/CV- RRR , +syst murmur , no gallop  , no  rub, nl s1 s2                           - JVD- none , edema- none, stasis changes- none, varices- none           Lung- clear to P&A, wheeze- none, cough- none , dullness-none, rub- none           Chest wall-  Abd-  Br/ Gen/ Rectal- Not done, not indicated Extrem- cyanosis- none, clubbing, none, atrophy- none, strength- nl Neuro- grossly intact to observation

## 2024-07-15 ENCOUNTER — Ambulatory Visit: Payer: Medicare Other | Admitting: Internal Medicine

## 2024-07-15 ENCOUNTER — Encounter: Payer: Self-pay | Admitting: Internal Medicine

## 2024-07-15 VITALS — BP 126/72 | HR 58 | Temp 98.4°F | Ht 65.0 in | Wt 171.4 lb

## 2024-07-15 DIAGNOSIS — G4733 Obstructive sleep apnea (adult) (pediatric): Secondary | ICD-10-CM | POA: Diagnosis not present

## 2024-07-15 DIAGNOSIS — J4541 Moderate persistent asthma with (acute) exacerbation: Secondary | ICD-10-CM

## 2024-07-15 DIAGNOSIS — J4540 Moderate persistent asthma, uncomplicated: Secondary | ICD-10-CM | POA: Diagnosis not present

## 2024-07-15 DIAGNOSIS — Z8709 Personal history of other diseases of the respiratory system: Secondary | ICD-10-CM

## 2024-07-15 MED ORDER — SPIRIVA RESPIMAT 1.25 MCG/ACT IN AERS: 2.0000 | INHALATION_SPRAY | Freq: Every day | RESPIRATORY_TRACT | 12 refills | Status: AC

## 2024-07-15 MED ORDER — FLUTICASONE FUROATE-VILANTEROL 200-25 MCG/ACT IN AEPB: INHALATION_SPRAY | RESPIRATORY_TRACT | 3 refills | Status: AC

## 2024-07-15 NOTE — Patient Instructions (Signed)
 Order- [facility]- please change autopap range to 4-10  We have refilled Breo 200  We have added Spiriva- inhale 2 puffs daily  See if this controls your asthma better. Let me know if this isn't ok.

## 2024-07-18 ENCOUNTER — Encounter: Payer: Self-pay | Admitting: Internal Medicine

## 2024-07-18 NOTE — Assessment & Plan Note (Signed)
 Symptomatic off CPAP. Need to see if reducing pressure will relieve eustachian dysfunction Plan- reduce to auto 4-10

## 2024-07-18 NOTE — Assessment & Plan Note (Signed)
 Moderate persistent Plan- add maintenance Spiriva

## 2024-07-28 ENCOUNTER — Telehealth: Payer: Self-pay | Admitting: *Deleted

## 2024-07-28 NOTE — Telephone Encounter (Signed)
   PA needed for [facility] Copied from CRM #[id number]. Topic: Clinical - Prescription Issue >> Jul 25, 2024  2:30 PM Wendy Christensen wrote: Reason for CRM: pt states Dr Neysa told her to call if the Tiotropium Bromide (SPIRIVA RESPIMAT) 1.25 MCG/ACT AERS is too expensive.  It is almost $400 her cost.  So she is going to stay on the [facility].  Dr Neysa called reo in on 10/28.  Pharmacy stats the Breo needs a prior auth.   The pharmacy is faxing the prior auth to office now.

## 2024-07-29 ENCOUNTER — Telehealth: Payer: Self-pay

## 2024-07-29 NOTE — Telephone Encounter (Addendum)
 Faxed CMN to [facility]. on patient.  Signed by Dr. Neysa.

## 2024-07-30 NOTE — Telephone Encounter (Signed)
 Please forward to Prior Auth team for [facility] if possible.

## 2024-07-30 NOTE — Telephone Encounter (Signed)
 PA Breo 200

## 2024-07-31 ENCOUNTER — Other Ambulatory Visit (HOSPITAL_COMMUNITY): Payer: Self-pay

## 2024-08-01 NOTE — Telephone Encounter (Signed)
 LMTCB x 1

## 2024-08-08 NOTE — Telephone Encounter (Signed)
 2nd message left closing encounter.

## 2024-08-18 ENCOUNTER — Encounter: Payer: Self-pay | Admitting: Podiatry

## 2024-08-18 ENCOUNTER — Ambulatory Visit (INDEPENDENT_AMBULATORY_CARE_PROVIDER_SITE_OTHER)

## 2024-08-18 ENCOUNTER — Ambulatory Visit: Admitting: Podiatry

## 2024-08-18 DIAGNOSIS — M19071 Primary osteoarthritis, right ankle and foot: Secondary | ICD-10-CM | POA: Diagnosis not present

## 2024-08-18 DIAGNOSIS — M76811 Anterior tibial syndrome, right leg: Secondary | ICD-10-CM | POA: Diagnosis not present

## 2024-08-18 DIAGNOSIS — M779 Enthesopathy, unspecified: Secondary | ICD-10-CM

## 2024-08-18 DIAGNOSIS — S86311D Strain of muscle(s) and tendon(s) of peroneal muscle group at lower leg level, right leg, subsequent encounter: Secondary | ICD-10-CM | POA: Diagnosis not present

## 2024-08-18 DIAGNOSIS — M7751 Other enthesopathy of right foot: Secondary | ICD-10-CM

## 2024-08-18 NOTE — Progress Notes (Signed)
 Subjective:  Patient ID: Wendy Christensen, female    DOB: [birth year],  MRN: [id number]  Chief Complaint  Patient presents with   Foot Pain    I'm having a problem with the ankle that I had surgery on.  It's my right one.  I could hardly walk last week.    Discussed the use of AI scribe software for clinical note transcription with the patient, who gave verbal consent to proceed.  History of Present Illness Wendy Christensen is a 78 year old female who presents with ankle pain and swelling.  She experiences pain and swelling in her ankle, which she attributes to possible arthritis. The pain is located across the front of the ankle, where she had surgery approximately six years ago, and is exacerbated by activities such as vacuuming and driving, particularly when pressing the pedal. Last week, the pain was severe and radiated across the front of the ankle but has improved with rest over the weekend.  She denies any history of arthritis diagnoses such as gout or rheumatoid arthritis. The swelling is persistent and occurs all the time. There is no pain on the side of the ankle or when pressing on it, but she experiences a pulling sensation when pushing down like a gas pedal. She has been using Aleve once a day, typically at night to aid sleep, and reports that it helps with the irritation.  Her social history includes working in a daycare, which involves physical activities like vacuuming. She resides in [place] and is familiar with local medical facilities.      Objective:    Physical Exam VASCULAR: DP and PT pulse palpable. Foot is warm and well-perfused. Capillary fill time is brisk. DERMATOLOGIC: Normal skin turgor, texture, and temperature. No open lesions, rashes, or ulcerations. NEUROLOGIC: Normal sensation to light touch and pressure. No paresthesias on examination. ORTHOPEDIC: 5/5 strength in all planes. No pain over the peroneal tendons. Edema on the lateral ankle,  soft along previous scar. Pain with plantar flexion along extensor and tibialis anterior tendon. No pain on the tendon sheath. No evidence of tendon rupture. No pain with palpation of anterior joint line or ROM of anterior ankle joint or subtalar joint.       Results RADIOLOGY Ankle X-ray: Maintained joint space of the tibiotalar and subtalar joints, minimal arthritic changes, increased soft tissue density in the subfibular area (08/18/2024)   Assessment:   1. Anterior tibialis tendinitis of right leg   2. Peroneal tendon rupture, right, subsequent encounter   3. Arthritis of right midfoot      Plan:  Patient was evaluated and treated and all questions answered.  Assessment and Plan Assessment & Plan Right ankle extensor and tibialis anterior tendinitis Pain and swelling in the right ankle, particularly in the extensor and tibialis anterior tendons, exacerbated by activities such as vacuuming and driving. No evidence of tendon rupture. Likely due to inflammation rather than arthritis, as x-rays show minimal arthritic changes. Steroid injections are not recommended due to potential tendon weakening. - Recommended bracing to support the ankle during activities. - Initiated physical therapy once or twice a week for 6-8 weeks to strengthen the tendons.  Referral sent to [facility] health physical therapy at [facility] - Continue OTC anti-inflammatory medication such as Aleve or naproxen once daily. - Consider using Voltaren gel up to three or four times a day for additional anti-inflammatory effect.  Status post right ankle tendon repair with residual lateral ankle edema Residual edema on the lateral  ankle along the previous surgical scar. No significant pain associated with the edema. X-rays show maintained joint space and minimal arthritic changes. - No specific intervention required for the edema unless it becomes painful.  Mild midfoot osteoarthritis, right foot Mild osteoarthritis in  the midfoot of the right foot, not significantly contributing to current symptoms. X-rays show minimal arthritic changes. - No specific intervention required for mild osteoarthritis at this time.      Return in about 8 weeks (around 10/13/2024) for Recheck anterior tibial tendinitis.

## 2024-08-18 NOTE — Patient Instructions (Signed)
 VISIT SUMMARY: Today, you were seen for pain and swelling in your right ankle. The pain is located across the front of your ankle, where you had surgery six years ago, and is worsened by activities like vacuuming and driving. You have been using Aleve to help with the irritation, which has provided some relief. X-rays show minimal arthritic changes, and there is no evidence of tendon rupture.  YOUR PLAN: -RIGHT ANKLE EXTENSOR AND TIBIALIS ANTERIOR TENDINITIS: This condition involves inflammation of the tendons in the front of your ankle, which is causing pain and swelling. To manage this, you should use a brace to support your ankle during activities, attend physical therapy once or twice a week for 6-8 weeks to strengthen the tendons, continue taking Aleve or naproxen once daily, and consider using Voltaren gel up to three or four times a day for additional relief.  -STATUS POST RIGHT ANKLE TENDON REPAIR WITH RESIDUAL LATERAL ANKLE EDEMA: You have some swelling on the side of your ankle where you had surgery, but it is not causing significant pain. No specific treatment is needed for this swelling unless it becomes painful.  -MILD MIDFOOT OSTEOARTHRITIS, RIGHT FOOT: This is a mild form of arthritis in the middle part of your foot. It is not significantly contributing to your current symptoms, so no specific treatment is needed at this time.  INSTRUCTIONS: Please follow up with physical therapy as recommended and continue using the medications and treatments as discussed. If you experience any new or worsening symptoms, please contact our office.                      Contains text generated by [employer].         Tibial Tendinitis Rehab Ask your health care provider which exercises are safe for you. Do exercises exactly as told by your health care provider and adjust them as directed. It is normal to feel mild stretching, pulling, tightness, or discomfort as you do these  exercises. Stop right away if you feel sudden pain or your pain gets worse. Do not begin these exercises until told by your health care provider. Stretching and range-of-motion exercises These exercises warm up your muscles and joints and improve the movement and flexibility in your ankle and foot. These exercises may also help to relieve pain. Standing wall calf stretch, knee straight   Stand with your hands against a wall. Extend your left / right leg behind you, and bend your front knee slightly. If directed, place a folded washcloth under the arch of your foot for support. Point the toes of your back foot slightly inward. Keeping your heels on the floor and your back knee straight, shift your weight toward the wall. Do not allow your back to arch. You should feel a gentle stretch in your upper left / right calf. Hold this position for 10 seconds. Repeat 10 times. Complete this exercise 2 times a day. Standing wall calf stretch, knee bent Stand with your hands against a wall. Extend your left / right leg behind you, and bend your front knee slightly. If directed, place a folded washcloth under the arch of your foot for support. Point the toes of your back foot slightly inward. Unlock your back knee so it is bent. Keep your heels on the floor. You should feel a gentle stretch deep in your lower left / right calf. Hold this position for 10 seconds. Repeat 10 times. Complete this exercise 2 times a day. Strengthening exercises  These exercises build strength and endurance in your ankle and foot. Endurance is the ability to use your muscles for a long time, even after they get tired. Ankle inversion with band Secure one end of a rubber exercise band or tubing to a fixed object, such as a table leg or a pole, that will stay still when the band is pulled. Loop the other end of the band around the middle of your left / right foot. Sit on the floor facing the object with your left / right leg  extended. The band or tube should be slightly tense when your foot is relaxed. Leading with your big toe, slowly bring your left / right foot and ankle inward, toward your other foot (inversion). Hold this position for 10 seconds. Slowly return your foot to the starting position. Repeat 10 times. Complete this exercise 2 times a day. Towel curls   Sit in a chair on a non-carpeted surface, and put your feet on the floor. Place a towel in front of your feet. Keeping your heel on the floor, put your left / right foot on the towel. Pull the towel toward you by grabbing the towel with your toes and curling them under. Keep your heel on the floor while you do this. Let your toes relax. Grab the towel with your toes again. Keep going until the towel is completely underneath your foot. Repeat 10 times. Complete this exercise 2 times a day. Balance exercise This exercise improves or maintains your balance. Balance is important in preventing falls. Single leg stand Without wearing shoes, stand near a railing or in a doorway. You may hold on to the railing or door frame as needed for balance. Stand on your left / right foot. Keep your big toe down on the floor and try to keep your arch lifted. If balancing in this position is too easy, try the exercise with your eyes closed or while standing on a pillow. Hold this position for 10 seconds. Repeat 10 times. Complete this exercise 2 times a day. This information is not intended to replace advice given to you by your health care provider. Make sure you discuss any questions you have with your health care provider.

## 2024-09-02 ENCOUNTER — Telehealth: Payer: Self-pay | Admitting: Family Medicine

## 2024-09-02 NOTE — Telephone Encounter (Signed)
 Copied from CRM #[id number]. Topic: Medicare AWV >> Sep 02, 2024  3:11 PM Nathanel DEL wrote: Called LVM 09/02/2024 to sched AWV. Please schedule AWV in office only.   Nathanel Paschal; Care Guide Ambulatory Clinical Support  l [facility] Direct Dial: [phone]

## 2024-10-14 ENCOUNTER — Ambulatory Visit: Admitting: Podiatry

## 2024-10-30 ENCOUNTER — Ambulatory Visit: Admitting: Podiatry

## 2024-11-03 ENCOUNTER — Encounter: Admitting: Pulmonary Disease

## 2024-12-15 ENCOUNTER — Ambulatory Visit: Admitting: Family Medicine
